# Patient Record
Sex: Male | Born: 1935 | Race: White | Hispanic: No | Marital: Married | Smoking: Former smoker
Health system: Southern US, Community
[De-identification: ages and names within clinical notes are randomized; demographics above are authoritative.]

## PROBLEM LIST (undated history)

## (undated) DIAGNOSIS — I2510 Atherosclerotic heart disease of native coronary artery without angina pectoris: Secondary | ICD-10-CM

## (undated) DIAGNOSIS — R509 Fever, unspecified: Secondary | ICD-10-CM

## (undated) DIAGNOSIS — J189 Pneumonia, unspecified organism: Secondary | ICD-10-CM

## (undated) DIAGNOSIS — R011 Cardiac murmur, unspecified: Secondary | ICD-10-CM

## (undated) DIAGNOSIS — E119 Type 2 diabetes mellitus without complications: Secondary | ICD-10-CM

## (undated) DIAGNOSIS — K219 Gastro-esophageal reflux disease without esophagitis: Secondary | ICD-10-CM

## (undated) DIAGNOSIS — M1990 Unspecified osteoarthritis, unspecified site: Secondary | ICD-10-CM

## (undated) DIAGNOSIS — N189 Chronic kidney disease, unspecified: Secondary | ICD-10-CM

## (undated) DIAGNOSIS — G4730 Sleep apnea, unspecified: Secondary | ICD-10-CM

## (undated) DIAGNOSIS — E785 Hyperlipidemia, unspecified: Secondary | ICD-10-CM

## (undated) DIAGNOSIS — A6920 Lyme disease, unspecified: Secondary | ICD-10-CM

## (undated) DIAGNOSIS — I219 Acute myocardial infarction, unspecified: Secondary | ICD-10-CM

## (undated) DIAGNOSIS — I10 Essential (primary) hypertension: Secondary | ICD-10-CM

## (undated) DIAGNOSIS — M75101 Unspecified rotator cuff tear or rupture of right shoulder, not specified as traumatic: Secondary | ICD-10-CM

## (undated) DIAGNOSIS — J449 Chronic obstructive pulmonary disease, unspecified: Secondary | ICD-10-CM

## (undated) DIAGNOSIS — J45909 Unspecified asthma, uncomplicated: Secondary | ICD-10-CM

## (undated) DIAGNOSIS — I209 Angina pectoris, unspecified: Secondary | ICD-10-CM

## (undated) DIAGNOSIS — R0602 Shortness of breath: Secondary | ICD-10-CM

## (undated) HISTORY — DX: Type 2 diabetes mellitus without complications: E11.9

## (undated) HISTORY — DX: Chronic obstructive pulmonary disease, unspecified: J44.9

## (undated) HISTORY — DX: Essential (primary) hypertension: I10

## (undated) HISTORY — PX: APPENDECTOMY: SHX54

## (undated) HISTORY — DX: Hyperlipidemia, unspecified: E78.5

## (undated) HISTORY — PX: CHOLECYSTECTOMY: SHX55

## (undated) HISTORY — PX: KIDNEY STONE SURGERY: SHX686

## (undated) HISTORY — DX: Pneumonia, unspecified organism: J18.9

## (undated) HISTORY — DX: Atherosclerotic heart disease of native coronary artery without angina pectoris: I25.10

## (undated) HISTORY — PX: CERVICAL LAMINECTOMY: SHX94

## (undated) HISTORY — DX: Unspecified asthma, uncomplicated: J45.909

---

## 1997-10-26 ENCOUNTER — Ambulatory Visit (HOSPITAL_COMMUNITY): Admission: RE | Admit: 1997-10-26 | Discharge: 1997-10-26 | Payer: Self-pay | Admitting: Cardiology

## 1997-12-15 ENCOUNTER — Observation Stay (HOSPITAL_COMMUNITY): Admission: AD | Admit: 1997-12-15 | Discharge: 1997-12-16 | Payer: Self-pay | Admitting: *Deleted

## 1999-01-10 ENCOUNTER — Encounter: Payer: Self-pay | Admitting: Cardiology

## 1999-01-10 ENCOUNTER — Ambulatory Visit (HOSPITAL_COMMUNITY): Admission: RE | Admit: 1999-01-10 | Discharge: 1999-01-10 | Payer: Self-pay | Admitting: Cardiology

## 1999-01-13 ENCOUNTER — Encounter: Payer: Self-pay | Admitting: Cardiology

## 1999-01-13 ENCOUNTER — Ambulatory Visit (HOSPITAL_COMMUNITY): Admission: RE | Admit: 1999-01-13 | Discharge: 1999-01-13 | Payer: Self-pay | Admitting: Cardiology

## 2002-06-24 ENCOUNTER — Ambulatory Visit (HOSPITAL_BASED_OUTPATIENT_CLINIC_OR_DEPARTMENT_OTHER): Admission: RE | Admit: 2002-06-24 | Discharge: 2002-06-24 | Payer: Self-pay | Admitting: Internal Medicine

## 2004-01-04 ENCOUNTER — Ambulatory Visit: Payer: Self-pay | Admitting: Internal Medicine

## 2004-01-12 ENCOUNTER — Ambulatory Visit: Payer: Self-pay | Admitting: Internal Medicine

## 2004-01-14 ENCOUNTER — Ambulatory Visit: Payer: Self-pay | Admitting: Internal Medicine

## 2004-04-13 ENCOUNTER — Ambulatory Visit: Payer: Self-pay | Admitting: Internal Medicine

## 2004-07-13 ENCOUNTER — Ambulatory Visit: Payer: Self-pay | Admitting: Internal Medicine

## 2004-09-01 ENCOUNTER — Observation Stay (HOSPITAL_COMMUNITY): Admission: EM | Admit: 2004-09-01 | Discharge: 2004-09-01 | Payer: Self-pay | Admitting: Emergency Medicine

## 2004-09-01 ENCOUNTER — Ambulatory Visit: Payer: Self-pay | Admitting: Cardiology

## 2004-09-20 ENCOUNTER — Ambulatory Visit: Payer: Self-pay | Admitting: Internal Medicine

## 2004-09-27 ENCOUNTER — Ambulatory Visit: Payer: Self-pay | Admitting: Internal Medicine

## 2004-10-23 ENCOUNTER — Ambulatory Visit: Payer: Self-pay | Admitting: Internal Medicine

## 2004-10-27 ENCOUNTER — Encounter: Admission: RE | Admit: 2004-10-27 | Discharge: 2004-10-27 | Payer: Self-pay | Admitting: Internal Medicine

## 2004-12-27 ENCOUNTER — Ambulatory Visit: Payer: Self-pay | Admitting: Internal Medicine

## 2005-03-28 ENCOUNTER — Ambulatory Visit: Payer: Self-pay | Admitting: Internal Medicine

## 2005-06-20 ENCOUNTER — Ambulatory Visit: Payer: Self-pay | Admitting: Internal Medicine

## 2005-06-27 ENCOUNTER — Ambulatory Visit: Payer: Self-pay | Admitting: Internal Medicine

## 2005-09-25 ENCOUNTER — Ambulatory Visit: Payer: Self-pay | Admitting: Internal Medicine

## 2005-12-17 ENCOUNTER — Ambulatory Visit: Payer: Self-pay | Admitting: Internal Medicine

## 2005-12-17 LAB — CONVERTED CEMR LAB
AST: 21 units/L (ref 0–37)
Cholesterol: 135 mg/dL (ref 0–200)
HDL: 27.1 mg/dL — ABNORMAL LOW (ref >39.0)
Hgb A1c MFr Bld: 6.7 % — ABNORMAL HIGH (ref 4.6–6.0)
VLDL: 41 mg/dL — ABNORMAL HIGH (ref 0–40)

## 2006-03-13 ENCOUNTER — Emergency Department (HOSPITAL_COMMUNITY): Admission: EM | Admit: 2006-03-13 | Discharge: 2006-03-14 | Payer: Self-pay | Admitting: Emergency Medicine

## 2006-03-13 ENCOUNTER — Ambulatory Visit: Payer: Self-pay | Admitting: Internal Medicine

## 2006-06-12 ENCOUNTER — Ambulatory Visit: Payer: Self-pay | Admitting: Internal Medicine

## 2006-09-11 ENCOUNTER — Ambulatory Visit: Payer: Self-pay | Admitting: Internal Medicine

## 2006-09-11 DIAGNOSIS — E1129 Type 2 diabetes mellitus with other diabetic kidney complication: Secondary | ICD-10-CM

## 2006-09-11 DIAGNOSIS — G4730 Sleep apnea, unspecified: Secondary | ICD-10-CM

## 2006-09-11 DIAGNOSIS — I25119 Atherosclerotic heart disease of native coronary artery with unspecified angina pectoris: Secondary | ICD-10-CM | POA: Insufficient documentation

## 2006-09-11 DIAGNOSIS — E1159 Type 2 diabetes mellitus with other circulatory complications: Secondary | ICD-10-CM | POA: Insufficient documentation

## 2006-09-11 DIAGNOSIS — I10 Essential (primary) hypertension: Secondary | ICD-10-CM | POA: Insufficient documentation

## 2006-09-11 DIAGNOSIS — E785 Hyperlipidemia, unspecified: Secondary | ICD-10-CM

## 2006-09-11 DIAGNOSIS — M549 Dorsalgia, unspecified: Secondary | ICD-10-CM

## 2006-09-11 DIAGNOSIS — E1169 Type 2 diabetes mellitus with other specified complication: Secondary | ICD-10-CM | POA: Insufficient documentation

## 2006-09-11 LAB — CONVERTED CEMR LAB
ALT: 29 units/L (ref 0–53)
Albumin: 3.9 g/dL (ref 3.5–5.2)
Alkaline Phosphatase: 66 units/L (ref 39–117)
BUN: 22 mg/dL (ref 6–23)
Chloride: 109 meq/L (ref 96–112)
Cholesterol: 121 mg/dL (ref 0–200)
Direct LDL: 47 mg/dL
GFR calc non Af Amer: 49 mL/min
Glucose, Bld: 193 mg/dL — ABNORMAL HIGH (ref 70–99)
HDL: 28.5 mg/dL — ABNORMAL LOW (ref >39.0)
Potassium: 4.2 meq/L (ref 3.5–5.1)
Sodium: 140 meq/L (ref 135–145)
Total CHOL/HDL Ratio: 4.2
Triglycerides: 228 mg/dL (ref 0–149)

## 2006-12-26 ENCOUNTER — Ambulatory Visit: Payer: Self-pay | Admitting: Internal Medicine

## 2007-01-17 ENCOUNTER — Telehealth (INDEPENDENT_AMBULATORY_CARE_PROVIDER_SITE_OTHER): Payer: Self-pay | Admitting: *Deleted

## 2007-03-01 ENCOUNTER — Ambulatory Visit: Payer: Self-pay | Admitting: Family Medicine

## 2007-03-01 ENCOUNTER — Telehealth (INDEPENDENT_AMBULATORY_CARE_PROVIDER_SITE_OTHER): Payer: Self-pay | Admitting: Family Medicine

## 2007-03-01 ENCOUNTER — Inpatient Hospital Stay (HOSPITAL_COMMUNITY): Admission: EM | Admit: 2007-03-01 | Discharge: 2007-03-04 | Payer: Self-pay | Admitting: Family Medicine

## 2007-03-01 ENCOUNTER — Ambulatory Visit: Payer: Self-pay | Admitting: Internal Medicine

## 2007-03-02 ENCOUNTER — Encounter (INDEPENDENT_AMBULATORY_CARE_PROVIDER_SITE_OTHER): Payer: Self-pay | Admitting: Family Medicine

## 2007-03-13 ENCOUNTER — Ambulatory Visit: Payer: Self-pay | Admitting: Internal Medicine

## 2007-03-17 ENCOUNTER — Ambulatory Visit: Payer: Self-pay | Admitting: Internal Medicine

## 2007-04-01 ENCOUNTER — Ambulatory Visit: Payer: Self-pay | Admitting: Internal Medicine

## 2007-04-01 LAB — CONVERTED CEMR LAB
ALT: 52 units/L (ref 0–53)
AST: 28 units/L (ref 0–37)
Albumin: 4.1 g/dL (ref 3.5–5.2)
Alkaline Phosphatase: 70 units/L (ref 39–117)
Cholesterol: 119 mg/dL (ref 0–200)
PSA: 0.55 ng/mL (ref 0.10–4.00)
Total Bilirubin: 0.9 mg/dL (ref 0.3–1.2)
Total CHOL/HDL Ratio: 4.4
Triglycerides: 217 mg/dL (ref 0–149)

## 2007-04-07 ENCOUNTER — Encounter: Payer: Self-pay | Admitting: Internal Medicine

## 2007-04-08 ENCOUNTER — Ambulatory Visit: Payer: Self-pay | Admitting: Internal Medicine

## 2007-04-08 LAB — CONVERTED CEMR LAB
Creatinine,U: 199.4 mg/dL
GFR calc non Af Amer: 45 mL/min
Microalb Creat Ratio: 14.5 mg/g (ref 0.0–30.0)

## 2007-04-17 ENCOUNTER — Emergency Department (HOSPITAL_COMMUNITY): Admission: EM | Admit: 2007-04-17 | Discharge: 2007-04-17 | Payer: Self-pay | Admitting: Emergency Medicine

## 2007-04-18 ENCOUNTER — Telehealth: Payer: Self-pay | Admitting: Internal Medicine

## 2007-05-19 ENCOUNTER — Encounter: Payer: Self-pay | Admitting: Internal Medicine

## 2007-05-19 ENCOUNTER — Ambulatory Visit: Payer: Self-pay | Admitting: Internal Medicine

## 2007-05-19 LAB — CONVERTED CEMR LAB: Inflenza A Ag: NEGATIVE

## 2007-05-22 ENCOUNTER — Telehealth: Payer: Self-pay | Admitting: Internal Medicine

## 2007-05-23 ENCOUNTER — Ambulatory Visit: Payer: Self-pay | Admitting: Internal Medicine

## 2007-05-23 LAB — CONVERTED CEMR LAB
Basophils Absolute: 0 10*3/uL (ref 0.0–0.1)
Basophils Relative: 0.6 % (ref 0.0–1.0)
Eosinophils Relative: 11.7 % — ABNORMAL HIGH (ref 0.0–5.0)
HCT: 35.3 % — ABNORMAL LOW (ref 39.0–52.0)
Neutro Abs: 4 10*3/uL (ref 1.4–7.7)
Neutrophils Relative %: 48.4 % (ref 43.0–77.0)
RDW: 14.4 % (ref 11.5–14.6)

## 2007-06-23 ENCOUNTER — Ambulatory Visit: Payer: Self-pay | Admitting: Internal Medicine

## 2007-06-23 DIAGNOSIS — R5383 Other fatigue: Secondary | ICD-10-CM

## 2007-06-23 DIAGNOSIS — R5381 Other malaise: Secondary | ICD-10-CM

## 2007-06-23 LAB — CONVERTED CEMR LAB
Basophils Relative: 0.1 % (ref 0.0–1.0)
Eosinophils Absolute: 0.8 10*3/uL — ABNORMAL HIGH (ref 0.0–0.7)
Eosinophils Relative: 7.6 % — ABNORMAL HIGH (ref 0.0–5.0)
HCT: 39.6 % (ref 39.0–52.0)
HDL goal, serum: 40 mg/dL
Hemoglobin: 13.3 g/dL (ref 13.0–17.0)
MCHC: 33.7 g/dL (ref 30.0–36.0)
MCV: 86.6 fL (ref 78.0–100.0)
Monocytes Absolute: 0.7 10*3/uL (ref 0.1–1.0)
Neutro Abs: 6.2 10*3/uL (ref 1.4–7.7)
Neutrophils Relative %: 59.5 % (ref 43.0–77.0)
RBC: 4.57 M/uL (ref 4.22–5.81)
RDW: 14.5 % (ref 11.5–14.6)
Relative Index: 3.2 — ABNORMAL HIGH (ref 0.0–2.5)

## 2007-06-26 ENCOUNTER — Telehealth: Payer: Self-pay | Admitting: Internal Medicine

## 2007-07-31 ENCOUNTER — Ambulatory Visit: Payer: Self-pay | Admitting: Internal Medicine

## 2007-07-31 DIAGNOSIS — G20 Parkinson's disease: Secondary | ICD-10-CM

## 2007-07-31 LAB — CONVERTED CEMR LAB
Chloride: 106 meq/L (ref 96–112)
Creatinine, Ser: 1.6 mg/dL — ABNORMAL HIGH (ref 0.4–1.5)
Sodium: 140 meq/L (ref 135–145)

## 2007-09-02 ENCOUNTER — Ambulatory Visit: Payer: Self-pay | Admitting: Internal Medicine

## 2007-09-22 ENCOUNTER — Ambulatory Visit: Payer: Self-pay | Admitting: Internal Medicine

## 2007-09-22 LAB — CONVERTED CEMR LAB: Hgb A1c MFr Bld: 7.3 % — ABNORMAL HIGH (ref 4.6–6.0)

## 2007-09-30 ENCOUNTER — Ambulatory Visit: Payer: Self-pay | Admitting: Internal Medicine

## 2007-09-30 DIAGNOSIS — H6120 Impacted cerumen, unspecified ear: Secondary | ICD-10-CM

## 2007-11-28 ENCOUNTER — Ambulatory Visit: Payer: Self-pay | Admitting: Internal Medicine

## 2007-11-29 ENCOUNTER — Encounter: Payer: Self-pay | Admitting: Internal Medicine

## 2007-11-29 LAB — CONVERTED CEMR LAB
BUN: 27 mg/dL — ABNORMAL HIGH (ref 6–23)
Calcium: 9.8 mg/dL (ref 8.4–10.5)
Chloride: 102 meq/L (ref 96–112)
Creatinine, Ser: 1.66 mg/dL — ABNORMAL HIGH (ref 0.40–1.50)
Hgb A1c MFr Bld: 7.5 % — ABNORMAL HIGH (ref 4.6–6.1)
Potassium: 4.4 meq/L (ref 3.5–5.3)

## 2007-12-09 ENCOUNTER — Encounter: Payer: Self-pay | Admitting: Internal Medicine

## 2007-12-09 ENCOUNTER — Ambulatory Visit: Payer: Self-pay

## 2007-12-12 ENCOUNTER — Ambulatory Visit: Payer: Self-pay | Admitting: Internal Medicine

## 2007-12-12 DIAGNOSIS — J441 Chronic obstructive pulmonary disease with (acute) exacerbation: Secondary | ICD-10-CM

## 2007-12-12 LAB — CONVERTED CEMR LAB
ALT: 19 units/L (ref 0–53)
Albumin: 4.4 g/dL (ref 3.5–5.2)
Alkaline Phosphatase: 67 units/L (ref 39–117)
Eosinophils Relative: 14 % — ABNORMAL HIGH (ref 0–5)
Indirect Bilirubin: 0.4 mg/dL (ref 0.0–0.9)
Monocytes Relative: 8 % (ref 3–12)
Neutro Abs: 3.8 10*3/uL (ref 1.7–7.7)
Neutrophils Relative %: 51 % (ref 43–77)
Platelets: 114 10*3/uL — ABNORMAL LOW (ref 150–400)
WBC: 7.4 10*3/uL (ref 4.0–10.5)

## 2007-12-23 ENCOUNTER — Encounter: Payer: Self-pay | Admitting: Internal Medicine

## 2008-01-06 ENCOUNTER — Telehealth: Payer: Self-pay | Admitting: Internal Medicine

## 2008-01-12 ENCOUNTER — Ambulatory Visit: Payer: Self-pay | Admitting: Internal Medicine

## 2008-03-15 ENCOUNTER — Ambulatory Visit: Payer: Self-pay | Admitting: Internal Medicine

## 2008-03-15 DIAGNOSIS — N413 Prostatocystitis: Secondary | ICD-10-CM | POA: Insufficient documentation

## 2008-03-15 LAB — CONVERTED CEMR LAB
ALT: 18 units/L (ref 0–53)
AST: 20 units/L (ref 0–37)
Albumin: 4 g/dL (ref 3.5–5.2)
Alkaline Phosphatase: 57 units/L (ref 39–117)
Calcium: 10.2 mg/dL (ref 8.4–10.5)
Cholesterol: 123 mg/dL (ref 0–200)
Creatinine, Ser: 1.6 mg/dL — ABNORMAL HIGH (ref 0.4–1.5)
GFR calc Af Amer: 55 mL/min
HDL: 28.8 mg/dL — ABNORMAL LOW (ref >39.0)
LDL Cholesterol: 60 mg/dL (ref 0–99)
Microalb, Ur: 1.5 mg/dL (ref 0.0–1.9)
PSA: 0.76 ng/mL (ref 0.10–4.00)
Total Bilirubin: 0.8 mg/dL (ref 0.3–1.2)
Total Protein: 6.7 g/dL (ref 6.0–8.3)

## 2008-06-14 ENCOUNTER — Ambulatory Visit: Payer: Self-pay | Admitting: Internal Medicine

## 2008-06-14 DIAGNOSIS — L570 Actinic keratosis: Secondary | ICD-10-CM

## 2008-08-13 ENCOUNTER — Ambulatory Visit: Payer: Self-pay | Admitting: Internal Medicine

## 2008-08-13 LAB — CONVERTED CEMR LAB: PSA: 0.8 ng/mL (ref 0.10–4.00)

## 2008-08-20 ENCOUNTER — Ambulatory Visit: Payer: Self-pay | Admitting: Internal Medicine

## 2008-08-20 LAB — CONVERTED CEMR LAB
BUN: 30 mg/dL — ABNORMAL HIGH (ref 6–23)
Calcium: 9.4 mg/dL (ref 8.4–10.5)
Potassium: 4.8 meq/L (ref 3.5–5.1)

## 2008-11-24 ENCOUNTER — Ambulatory Visit: Payer: Self-pay | Admitting: Internal Medicine

## 2008-11-24 DIAGNOSIS — R361 Hematospermia: Secondary | ICD-10-CM | POA: Insufficient documentation

## 2008-12-18 ENCOUNTER — Telehealth: Payer: Self-pay | Admitting: Internal Medicine

## 2009-01-16 ENCOUNTER — Emergency Department (HOSPITAL_COMMUNITY): Admission: EM | Admit: 2009-01-16 | Discharge: 2009-01-17 | Payer: Self-pay | Admitting: Emergency Medicine

## 2009-01-21 ENCOUNTER — Ambulatory Visit: Payer: Self-pay | Admitting: Internal Medicine

## 2009-01-21 DIAGNOSIS — N200 Calculus of kidney: Secondary | ICD-10-CM | POA: Insufficient documentation

## 2009-02-02 ENCOUNTER — Telehealth: Payer: Self-pay | Admitting: Internal Medicine

## 2009-02-11 ENCOUNTER — Encounter (INDEPENDENT_AMBULATORY_CARE_PROVIDER_SITE_OTHER): Payer: Self-pay | Admitting: *Deleted

## 2009-02-14 ENCOUNTER — Ambulatory Visit: Payer: Self-pay | Admitting: Internal Medicine

## 2009-02-14 LAB — CONVERTED CEMR LAB
Basophils Relative: 0.9 % (ref 0.0–3.0)
Bilirubin, Direct: 0 mg/dL (ref 0.0–0.3)
Calcium: 9.3 mg/dL (ref 8.4–10.5)
Chloride: 108 meq/L (ref 96–112)
Cholesterol: 102 mg/dL (ref 0–200)
Creatinine, Ser: 1.8 mg/dL — ABNORMAL HIGH (ref 0.4–1.5)
Eosinophils Absolute: 1.4 10*3/uL — ABNORMAL HIGH (ref 0.0–0.7)
Eosinophils Relative: 19.8 % — ABNORMAL HIGH (ref 0.0–5.0)
HCT: 39.1 % (ref 39.0–52.0)
LDL Cholesterol: 39 mg/dL (ref 0–99)
MCHC: 34 g/dL (ref 30.0–36.0)
PSA: 0.78 ng/mL (ref 0.10–4.00)
Potassium: 4.2 meq/L (ref 3.5–5.1)
Sodium: 145 meq/L (ref 135–145)
Total CHOL/HDL Ratio: 4
Total Protein: 6.4 g/dL (ref 6.0–8.3)
Triglycerides: 168 mg/dL — ABNORMAL HIGH (ref 0.0–149.0)
WBC: 7 10*3/uL (ref 4.5–10.5)

## 2009-02-22 ENCOUNTER — Ambulatory Visit: Payer: Self-pay | Admitting: Internal Medicine

## 2009-02-22 DIAGNOSIS — N1832 Chronic kidney disease, stage 3b: Secondary | ICD-10-CM | POA: Insufficient documentation

## 2009-02-22 DIAGNOSIS — N183 Chronic kidney disease, stage 3 (moderate): Secondary | ICD-10-CM

## 2009-03-15 ENCOUNTER — Encounter: Payer: Self-pay | Admitting: Internal Medicine

## 2009-04-08 ENCOUNTER — Telehealth: Payer: Self-pay | Admitting: Internal Medicine

## 2009-05-23 ENCOUNTER — Encounter: Payer: Self-pay | Admitting: Internal Medicine

## 2009-05-24 ENCOUNTER — Ambulatory Visit: Payer: Self-pay | Admitting: Internal Medicine

## 2009-05-24 LAB — CONVERTED CEMR LAB
CO2: 32 meq/L (ref 19–32)
Calcium: 9.2 mg/dL (ref 8.4–10.5)
Chloride: 105 meq/L (ref 96–112)
Creatinine, Ser: 1.8 mg/dL — ABNORMAL HIGH (ref 0.4–1.5)
GFR calc non Af Amer: 39.34 mL/min (ref >60)
Glucose, Bld: 164 mg/dL — ABNORMAL HIGH (ref 70–99)
Hgb A1c MFr Bld: 7.8 % — ABNORMAL HIGH (ref 4.6–6.5)
Potassium: 5.1 meq/L (ref 3.5–5.1)

## 2009-06-08 LAB — HM DIABETES EYE EXAM: HM Diabetic Eye Exam: NORMAL

## 2009-06-14 ENCOUNTER — Encounter: Payer: Self-pay | Admitting: Internal Medicine

## 2009-09-23 ENCOUNTER — Ambulatory Visit: Payer: Self-pay | Admitting: Internal Medicine

## 2009-09-23 LAB — CONVERTED CEMR LAB
BUN: 30 mg/dL — ABNORMAL HIGH (ref 6–23)
Cholesterol: 133 mg/dL (ref 0–200)
HDL: 29.1 mg/dL — ABNORMAL LOW (ref >39.00)
Potassium: 4.6 meq/L (ref 3.5–5.1)

## 2009-12-20 ENCOUNTER — Encounter: Payer: Self-pay | Admitting: Internal Medicine

## 2009-12-30 ENCOUNTER — Ambulatory Visit: Payer: Self-pay | Admitting: Internal Medicine

## 2009-12-30 LAB — CONVERTED CEMR LAB
CO2: 31 meq/L (ref 19–32)
Chloride: 104 meq/L (ref 96–112)
Creatinine, Ser: 2 mg/dL — ABNORMAL HIGH (ref 0.4–1.5)
Hgb A1c MFr Bld: 8.1 % — ABNORMAL HIGH (ref 4.6–6.5)
Sodium: 140 meq/L (ref 135–145)

## 2009-12-30 LAB — HM DIABETES FOOT EXAM

## 2010-03-31 ENCOUNTER — Ambulatory Visit
Admission: RE | Admit: 2010-03-31 | Discharge: 2010-03-31 | Payer: Self-pay | Source: Home / Self Care | Attending: Internal Medicine | Admitting: Internal Medicine

## 2010-03-31 ENCOUNTER — Other Ambulatory Visit: Payer: Self-pay | Admitting: Internal Medicine

## 2010-03-31 LAB — BASIC METABOLIC PANEL
Chloride: 106 mEq/L (ref 96–112)
GFR: 40.82 mL/min — ABNORMAL LOW (ref >60.00)
Potassium: 5.5 mEq/L — ABNORMAL HIGH (ref 3.5–5.1)

## 2010-03-31 LAB — MICROALBUMIN / CREATININE URINE RATIO: Creatinine,U: 149.8 mg/dL

## 2010-04-04 NOTE — Consult Note (Signed)
Summary: [facility]  [facility]   Imported By: Laural Benes 03/21/2009 09:54:50  _____________________________________________________________________  External Attachment:    Type:   Image     Comment:   External Document

## 2010-04-04 NOTE — Letter (Signed)
Summary: Eye Exam/[facility] [place] [facility]   Imported By: Laural Benes 05/25/2009 14:31:11  _____________________________________________________________________  External Attachment:    Type:   Image     Comment:   External Document

## 2010-04-04 NOTE — Assessment & Plan Note (Signed)
Summary: 3 MO ROV/MM   Vital Signs:  Patient profile:   77 year old male Height:      70 inches Weight:      214 pounds BMI:     30.82 Temp:     98.1 degrees F oral Pulse rate:   68 / minute Resp:     14 per minute BP sitting:   130 / 80  (left arm)  Vitals Entered By: Allyne Gee, LPN (March 22, [id number] [id number] AM) CC: roa-per urology note- urocrit-k 2 bid was added, Hypertension Management, Lipid Management   CC:  roa-per urology note- urocrit-k 2 bid was added, Hypertension Management, and Lipid Management.  History of Present Illness: DISCUSSION OF HEMATOSPERMIA AND BLOOD IN URINE AND COMPLETE WORK UP INCLUDING CYSTOSCOPY  Hypertension History:      He denies headache, chest pain, palpitations, dyspnea with exertion, orthopnea, PND, peripheral edema, visual symptoms, neurologic problems, syncope, and side effects from treatment.        Positive major cardiovascular risk factors include male age 12 years old or older, diabetes, hyperlipidemia, and hypertension.  Negative major cardiovascular risk factors include negative family history for ischemic heart disease and non-tobacco-user status.        Positive history for target organ damage include ASHD (either angina/prior MI/prior CABG).  Further assessment for target organ damage reveals no history of stroke/TIA or peripheral vascular disease.    Lipid Management History:      Positive NCEP/ATP III risk factors include male age 57 years old or older, diabetes, HDL cholesterol less than 40, hypertension, and ASHD (either angina/prior MI/prior CABG).  Negative NCEP/ATP III risk factors include no family history for ischemic heart disease, non-tobacco-user status, no prior stroke/TIA, no peripheral vascular disease, and no history of aortic aneurysm.      Preventive Screening-Counseling & Management  Alcohol-Tobacco     Smoking Status: quit     Year Quit: 1970     Passive Smoke Exposure: no  Problems Prior to Update: 1)   Cerumen Impaction  (ICD-380.4) 2)  Chronic Kidney Disease Stage II (MILD)  (ICD-585.2) 3)  Preventive Health Care  (ICD-V70.0) 4)  Nephrolithiasis, Recurrent  (ICD-592.0) 5)  Hematospermia  (ICD-608.82) 6)  Actinic Keratosis, Head  (ICD-702.0) 7)  Prostatocystitis  (ICD-601.3) 8)  Chronic Obstructive Pulmonary Disease, Acute Exacerbation  (ICD-491.21) 9)  Cerumen Impaction  (ICD-380.4) 10)  Parkinson's Disease  (ICD-332.0) 11)  Malaise and Fatigue  (ICD-780.79) 12)  Sleep Apnea  (ICD-780.57) 13)  Back Pain  (ICD-724.5) 14)  Hypertension  (ICD-401.9) 15)  Hyperlipidemia  (ICD-272.4) 16)  Diabetes Mellitus, Type II  (ICD-250.00) 17)  Coronary Artery Disease  (ICD-414.00)  Current Problems (verified): 1)  Cerumen Impaction  (ICD-380.4) 2)  Chronic Kidney Disease Stage II (MILD)  (ICD-585.2) 3)  Preventive Health Care  (ICD-V70.0) 4)  Nephrolithiasis, Recurrent  (ICD-592.0) 5)  Hematospermia  (ICD-608.82) 6)  Actinic Keratosis, Head  (ICD-702.0) 7)  Prostatocystitis  (ICD-601.3) 8)  Chronic Obstructive Pulmonary Disease, Acute Exacerbation  (ICD-491.21) 9)  Cerumen Impaction  (ICD-380.4) 10)  Parkinson's Disease  (ICD-332.0) 11)  Malaise and Fatigue  (ICD-780.79) 12)  Sleep Apnea  (ICD-780.57) 13)  Back Pain  (ICD-724.5) 14)  Hypertension  (ICD-401.9) 15)  Hyperlipidemia  (ICD-272.4) 16)  Diabetes Mellitus, Type II  (ICD-250.00) 17)  Coronary Artery Disease  (ICD-414.00)  Medications Prior to Update: 1)  Tramadol Hcl 50 Mg Tabs (Tramadol Hcl) .... With Tylenol 325mg  Every 6 Hours Prjn Pain(This Is Substitute For  Dct-N) 2)  Trazodone Hcl 150 Mg  Tabs (Trazodone Hcl) .... Once Daily 3)  Imdur 30 Mg  Tb24 (Isosorbide Mononitrate) .... 3 Once Daily 4)  Metoprolol Succinate 50 Mg  Tb24 (Metoprolol Succinate) .... 1/2 Two Times A Day 5)  Lipitor 10 Mg  Tabs (Atorvastatin Calcium) .... Once Daily 6)  Zantac 300 Mg  Tabs (Ranitidine Hcl) .... Two Times A Day 7)  Amaryl 2 Mg  Tabs  (Glimepiride) .[name].. 1 Once Daily 8)  Therapy Bayer Ec 325 Mg  Tbec (Aspirin) .... Once Daily 9)  Nitro-Dur 0.4 Mg/hr  Pt24 (Nitroglycerin) .... As Needed 10)  Advair Diskus 100-50 Mcg/dose Misc (Fluticasone-Salmeterol) .... One Puff Daily 11)  Metformin Hcl 500 Mg Xr24h-Tab (Metformin Hcl) .[name].. 1 Once Daily 12)  Betamethasone Dipropionate 0.05 % Oint (Betamethasone Dipropionate) .... Aplly Daily As Directed 13)  Doxycycline Hyclate 100 Mg Caps (Doxycycline Hyclate) .... One By Mouth Daily  Current Medications (verified): 1)  Tramadol Hcl 50 Mg Tabs (Tramadol Hcl) .... With Tylenol 325mg  Every 6 Hours Prjn Pain(This Is Substitute For Dct-N) 2)  Trazodone Hcl 150 Mg  Tabs (Trazodone Hcl) .... Once Daily 3)  Imdur 30 Mg  Tb24 (Isosorbide Mononitrate) .... 3 Once Daily 4)  Metoprolol Succinate 50 Mg  Tb24 (Metoprolol Succinate) .... 1/2 Two Times A Day 5)  Lipitor 10 Mg  Tabs (Atorvastatin Calcium) .... Once Daily 6)  Zantac 300 Mg  Tabs (Ranitidine Hcl) .... Two Times A Day 7)  Amaryl 2 Mg  Tabs (Glimepiride) .[name].. 1 Once Daily 8)  Therapy Bayer Ec 325 Mg  Tbec (Aspirin) .... Once Daily 9)  Nitro-Dur 0.4 Mg/hr  Pt24 (Nitroglycerin) .... As Needed 10)  Advair Diskus 100-50 Mcg/dose Misc (Fluticasone-Salmeterol) .... One Puff Daily 11)  Metformin Hcl 500 Mg Xr24h-Tab (Metformin Hcl) .[name].. 1 Once Daily 12)  Betamethasone Dipropionate 0.05 % Oint (Betamethasone Dipropionate) .... Aplly Daily As Directed 13)  Doxycycline Hyclate 100 Mg Caps (Doxycycline Hyclate) .... One By Mouth Daily 14)  Uroxatral 10 Mg Tb24 (Alfuzosin Hcl) .[name].. 1 By Mouth Daily 15)  Urocrit K 15 .[name].. 1 Two Times A Day  Allergies (verified): 1)  ! Codeine Sulfate (Codeine Sulfate) 2)  Keflex (Cephalexin)  Past History:  Family History: Last updated: 12/26/2006 Family History High cholesterol Family History Hypertension  Social History: Last updated: 12/26/2006 Married Former Smoker Alcohol use-no  Risk  Factors: Caffeine Use: 1 (12/26/2006) Exercise: no (12/26/2006)  Risk Factors: Smoking Status: quit (05/24/2009) Passive Smoke Exposure: no (05/24/2009)  Past medical, surgical, family and social histories (including risk factors) reviewed, and no changes noted (except as noted below).  Past Medical History: Reviewed history from 03/15/2008 and no changes required. Coronary artery disease Diabetes mellitus, type II Hyperlipidemia Hypertension copd stage 2  Past Surgical History: Reviewed history from 04/08/2007 and no changes required. Cervical laminectomy  Percutaneous transluminal coronary angioplasty with atherectomy 1999  Family History: Reviewed history from 12/26/2006 and no changes required. Family History High cholesterol Family History Hypertension  Social History: Reviewed history from 12/26/2006 and no changes required. Married Former Smoker Alcohol use-no  Review of Systems  The patient denies anorexia, fever, weight loss, weight gain, vision loss, decreased hearing, hoarseness, chest pain, syncope, dyspnea on exertion, peripheral edema, prolonged cough, headaches, hemoptysis, abdominal pain, melena, hematochezia, severe indigestion/heartburn, hematuria, incontinence, genital sores, muscle weakness, suspicious skin lesions, transient blindness, difficulty walking, depression, unusual weight change, abnormal bleeding, enlarged lymph nodes, angioedema, breast masses, and testicular masses.    Physical Exam  General:  alert, well-developed, and pale.   Head:  normocephalic and atraumatic.   Eyes:  pupils equal and pupils round.   Ears:  R ear normal and L ear normal.   Mouth:  pharynx pink and moist, no erythema, and no posterior lymphoid hypertrophy.   Neck:  No deformities, masses, or tenderness noted. Lungs:  normal respiratory effort, no accessory muscle use, and normal breath sounds.   Heart:  normal rate, regular rhythm, and no gallop.   Abdomen:  soft,  normal bowel sounds, no distention, and no masses.    Diabetes Management Exam:    Foot Exam (with socks and/or shoes not present):       Sensory-Pinprick/Light touch:          Left medial foot (L-4): diminished          Left dorsal foot (L-5): diminished          Left lateral foot (S-1): diminished          Right medial foot (L-4): diminished          Right dorsal foot (L-5): diminished          Right lateral foot (S-1): diminished       Nails:          Left foot: normal          Right foot: normal    Eye Exam:       Eye Exam done elsewhere          Date: 05/23/2009          Results: normal          Done by: Gershon Crane   Impression & Recommendations:  Problem # 1:  CHRONIC KIDNEY DISEASE STAGE II (MILD) (ICD-585.2) Assessment Unchanged MONITERING LABS Labs Reviewed: BUN: 27 (02/14/2009)   Cr: 1.8 (02/14/2009)    Hgb: 13.3 (02/14/2009)   Hct: 39.1 (02/14/2009)   Ca++: 9.3 (02/14/2009)    TP: 6.4 (02/14/2009)   Alb: 4.0 (02/14/2009)  Problem # 2:  HYPERTENSION (ICD-401.9) Assessment: Unchanged  His updated medication list for this problem includes:    Metoprolol Succinate 50 Mg Tb24 (Metoprolol succinate) .[name]... 1/2 two times a day  BP today: 130/80 Prior BP: 140/76 (02/22/2009)  Prior 10 Yr Risk Heart Disease: N/A (12/26/2006)  Labs Reviewed: K+: 4.2 (02/14/2009) Creat: : 1.8 (02/14/2009)   Chol: 102 (02/14/2009)   HDL: 29.00 (02/14/2009)   LDL: 39 (02/14/2009)   TG: 168.0 (02/14/2009)  Problem # 3:  DIABETES MELLITUS, TYPE II (ICD-250.00)  monitering , CBG;s goo DM eye exam and foot exam His updated medication list for this problem includes:    Amaryl 2 Mg Tabs (Glimepiride) .[name]... 1 once daily    Therapy Bayer Ec 325 Mg Tbec (Aspirin) ..... Once daily    Metformin Hcl 500 Mg Xr24h-tab (Metformin hcl) .[name]... 1 once daily  Orders: Venipuncture [id number]) TLB-BMP (Basic Metabolic Panel-BMET) (99991111) TLB-A1C / Hgb A1C (Glycohemoglobin) (83036-A1C)  Labs  Reviewed: Creat: 1.8 (02/14/2009)     Last Eye Exam: normal (05/23/2009) Reviewed HgBA1c results: 7.3 (08/13/2008)  7.5 (03/15/2008)  Complete Medication List: 1)  Tramadol Hcl 50 Mg Tabs (Tramadol hcl) .... With tylenol 325mg  every 6 hours prjn pain(this is substitute for dct-n) 2)  Trazodone Hcl 150 Mg Tabs (Trazodone hcl) .... Once daily 3)  Imdur 30 Mg Tb24 (Isosorbide mononitrate) .... 3 once daily 4)  Metoprolol Succinate 50 Mg Tb24 (Metoprolol succinate) .... 1/2 two times a day 5)  Lipitor 10  Mg Tabs (Atorvastatin calcium) .... Once daily 6)  Zantac 300 Mg Tabs (Ranitidine hcl) .... Two times a day 7)  Amaryl 2 Mg Tabs (Glimepiride) .[name].. 1 once daily 8)  Therapy Bayer Ec 325 Mg Tbec (Aspirin) .... Once daily 9)  Nitro-dur 0.4 Mg/hr Pt24 (Nitroglycerin) .... As needed 10)  Advair Diskus 100-50 Mcg/dose Misc (Fluticasone-salmeterol) .... One puff daily 11)  Metformin Hcl 500 Mg Xr24h-tab (Metformin hcl) .[name].. 1 once daily 12)  Betamethasone Dipropionate 0.05 % Oint (Betamethasone dipropionate) .... Aplly daily as directed 13)  Doxycycline Hyclate 100 Mg Caps (Doxycycline hyclate) .... One by mouth daily 14)  Uroxatral 10 Mg Tb24 (Alfuzosin hcl) .[name].. 1 by mouth daily 15)  Urocrit K 15  .[name].. 1 two times a day  Hypertension Assessment/Plan:      The patient's hypertensive risk group is category C: Target organ damage and/or diabetes.  Today's blood pressure is 130/80.  His blood pressure goal is < 130/80.  Lipid Assessment/Plan:      Based on NCEP/ATP III, the patient's risk factor category is "history of coronary disease, peripheral vascular disease, cerebrovascular disease, or aortic aneurysm along with either diabetes, current smoker, or LDL > 130 plus HDL < 40 plus triglycerides > 200".  The patient's lipid goals are as follows: Total cholesterol goal is 200; LDL cholesterol goal is 70; HDL cholesterol goal is 40; Triglyceride goal is 150.  His LDL cholesterol goal has been met.     Patient Instructions: 1)  Please schedule a follow-up appointment in 4 months.

## 2010-04-04 NOTE — Progress Notes (Signed)
Summary: nede abx  Phone Note Call from Patient   Caller: Patient-----live call Reason for Call: Talk to Nurse Summary of Call: has a tick on him and would like an abx called to [facility]   in [place]. Initial call taken by: Despina Arias,  April 08, 2009 8:18 AM  Follow-up for Phone Call        Pt. has a tick that is still attached, and has put vaseline on it.  Is finishing up the Doxycyline (long term) , and wants to know if he should take more due to the tick bite.  Should he leave the tick on? [phone]  Follow-up by: Deanna Artis CMA,  April 08, 2009 8:31 AM  Additional Follow-up for Phone Call Additional follow up Details #1::        remove the tick.... should be covered by the doxy. stay out of woods! Additional Follow-up by: Ricard Dillon MD,  April 08, 2009 9:15 AM    Additional Follow-up for Phone Call Additional follow up Details #2::    Pt. advised. Follow-up by: Deanna Artis CMA,  April 08, 2009 9:29 AM

## 2010-04-04 NOTE — Consult Note (Signed)
Summary: [facility]  [facility]   Imported By: Laural Benes 06/20/2009 10:58:58  _____________________________________________________________________  External Attachment:    Type:   Image     Comment:   External Document

## 2010-04-04 NOTE — Assessment & Plan Note (Signed)
Summary: 3 month fup//ccm/pt rescd from bump//ccm   Vital Signs:  Patient profile:   77 year old male Height:      70 inches Weight:      209 pounds BMI:     30.10 Temp:     98.2 degrees F oral Pulse rate:   60 / minute Resp:     14 per minute BP sitting:   136 / 80  (left arm)  Vitals Entered By: Allyne Gee, LPN (October 28, 624THL 9:46 AM) CC: roa- c/o skin in ear canal cracking and irritated, Hypertension Management Is Patient Diabetic? Yes Did you bring your meter with you today? No   Primary Care Provider:  Ricard Dillon MD  CC:  roa- c/o skin in ear canal cracking and irritated and Hypertension Management.  History of Present Illness: The pt saw the urologist for renal stone and hematuria the pt was given a liguid for stones ut it was too expesive and he dod not get it filled celebrated 89 aniversary His CBGs are improved  Hypertension History:      He denies headache, chest pain, palpitations, dyspnea with exertion, orthopnea, PND, peripheral edema, visual symptoms, neurologic problems, syncope, and side effects from treatment.        Positive major cardiovascular risk factors include male age 57 years old or older, diabetes, hyperlipidemia, and hypertension.  Negative major cardiovascular risk factors include negative family history for ischemic heart disease and non-tobacco-user status.        Positive history for target organ damage include ASHD (either angina/prior MI/prior CABG).  Further assessment for target organ damage reveals no history of stroke/TIA or peripheral vascular disease.     Preventive Screening-Counseling & Management  Alcohol-Tobacco     Smoking Status: quit     Year Quit: 1970     Passive Smoke Exposure: no     Tobacco Counseling: to remain off tobacco products  Problems Prior to Update: 1)  Cerumen Impaction  (ICD-380.4) 2)  Chronic Kidney Disease Stage II (MILD)  (ICD-585.2) 3)  Preventive Health Care  (ICD-V70.0) 4)   Nephrolithiasis, Recurrent  (ICD-592.0) 5)  Hematospermia  (ICD-608.82) 6)  Actinic Keratosis, Head  (ICD-702.0) 7)  Prostatocystitis  (ICD-601.3) 8)  Chronic Obstructive Pulmonary Disease, Acute Exacerbation  (ICD-491.21) 9)  Cerumen Impaction  (ICD-380.4) 10)  Parkinson's Disease  (ICD-332.0) 11)  Malaise and Fatigue  (ICD-780.79) 12)  Sleep Apnea  (ICD-780.57) 13)  Back Pain  (ICD-724.5) 14)  Hypertension  (ICD-401.9) 15)  Hyperlipidemia  (ICD-272.4) 16)  Diabetes Mellitus, Type II  (ICD-250.00) 17)  Coronary Artery Disease  (ICD-414.00)  Current Problems (verified): 1)  Cerumen Impaction  (ICD-380.4) 2)  Chronic Kidney Disease Stage II (MILD)  (ICD-585.2) 3)  Preventive Health Care  (ICD-V70.0) 4)  Nephrolithiasis, Recurrent  (ICD-592.0) 5)  Hematospermia  (ICD-608.82) 6)  Actinic Keratosis, Head  (ICD-702.0) 7)  Prostatocystitis  (ICD-601.3) 8)  Chronic Obstructive Pulmonary Disease, Acute Exacerbation  (ICD-491.21) 9)  Cerumen Impaction  (ICD-380.4) 10)  Parkinson's Disease  (ICD-332.0) 11)  Malaise and Fatigue  (ICD-780.79) 12)  Sleep Apnea  (ICD-780.57) 13)  Back Pain  (ICD-724.5) 14)  Hypertension  (ICD-401.9) 15)  Hyperlipidemia  (ICD-272.4) 16)  Diabetes Mellitus, Type II  (ICD-250.00) 17)  Coronary Artery Disease  (ICD-414.00)  Medications Prior to Update: 1)  Tramadol Hcl 50 Mg Tabs (Tramadol Hcl) .... With Tylenol 325mg  Every 6 Hours Prjn Pain(This Is Substitute For Dct-N) 2)  Trazodone Hcl 150 Mg  Tabs (  Trazodone Hcl) .... Once Daily 3)  Imdur 30 Mg  Tb24 (Isosorbide Mononitrate) .... 3 Once Daily 4)  Metoprolol Succinate 50 Mg  Tb24 (Metoprolol Succinate) .... 1/2 Two Times A Day 5)  Lipitor 10 Mg  Tabs (Atorvastatin Calcium) .... Once Daily 6)  Zantac 300 Mg  Tabs (Ranitidine Hcl) .... Two Times A Day 7)  Amaryl 2 Mg  Tabs (Glimepiride) .[name].. 1 Once Daily 8)  Therapy Bayer Ec 325 Mg  Tbec (Aspirin) .... Once Daily 9)  Nitro-Dur 0.4 Mg/hr  Pt24  (Nitroglycerin) .... As Needed 10)  Advair Diskus 100-50 Mcg/dose Misc (Fluticasone-Salmeterol) .... One Puff Daily 11)  Metformin Hcl 500 Mg Xr24h-Tab (Metformin Hcl) .[name].. 1 Once Daily 12)  Betamethasone Dipropionate 0.05 % Oint (Betamethasone Dipropionate) .... Aplly Daily As Directed 13)  Uroxatral 10 Mg Tb24 (Alfuzosin Hcl) .[name].. 1 By Mouth Daily 14)  Urocit-K 15 15 Meq (1620 Mg) Cr-Tabs (Potassium Citrate) .[name].. 1 Two Times A Day 15)  Cortisporin 3.5-10000-1 Soln (Neomycin-Polymyxin-Hc) .... 4 Drops in Both Ears Qid For 10 Days  Current Medications (verified): 1)  Tramadol Hcl 50 Mg Tabs (Tramadol Hcl) .... With Tylenol 325mg  Every 6 Hours Prjn Pain(This Is Substitute For Dct-N) 2)  Trazodone Hcl 150 Mg  Tabs (Trazodone Hcl) .... Once Daily 3)  Imdur 30 Mg  Tb24 (Isosorbide Mononitrate) .... 3 Once Daily 4)  Metoprolol Succinate 50 Mg  Tb24 (Metoprolol Succinate) .... 1/2 Two Times A Day 5)  Lipitor 10 Mg  Tabs (Atorvastatin Calcium) .... Once Daily 6)  Zantac 300 Mg  Tabs (Ranitidine Hcl) .... Two Times A Day 7)  Amaryl 2 Mg  Tabs (Glimepiride) .[name].. 1 Once Daily 8)  Therapy Bayer Ec 325 Mg  Tbec (Aspirin) .... Once Daily 9)  Nitro-Dur 0.4 Mg/hr  Pt24 (Nitroglycerin) .... As Needed 10)  Advair Diskus 100-50 Mcg/dose Misc (Fluticasone-Salmeterol) .... One Puff Daily 11)  Metformin Hcl 500 Mg Xr24h-Tab (Metformin Hcl) .[name].. 1 Once Daily 12)  Betamethasone Dipropionate 0.05 % Oint (Betamethasone Dipropionate) .... Aplly Daily As Directed 13)  Uroxatral 10 Mg Tb24 (Alfuzosin Hcl) .[name].. 1 By Mouth Daily 14)  Urocit-K 15 15 Meq (1620 Mg) Cr-Tabs (Potassium Citrate) .[name].. 1 Two Times A Day 15)  Cortisporin 3.5-10000-1 Soln (Neomycin-Polymyxin-Hc) .... 4 Drops in Both Ears Qid For 10 Days  Allergies (verified): 1)  ! Codeine Sulfate (Codeine Sulfate) 2)  Keflex (Cephalexin)   Past History:  Family History: Last updated: 12/26/2006 Family History High cholesterol Family History  Hypertension  Social History: Last updated: 12/26/2006 Married Former Smoker Alcohol use-no  Risk Factors: Caffeine Use: 1 (12/26/2006) Exercise: no (12/26/2006)  Risk Factors: Smoking Status: quit (12/30/2009) Passive Smoke Exposure: no (12/30/2009)  Past medical, surgical, family and social histories (including risk factors) reviewed, and no changes noted (except as noted below).  Past Medical History: Reviewed history from 03/15/2008 and no changes required. Coronary artery disease Diabetes mellitus, type II Hyperlipidemia Hypertension copd stage 2  Past Surgical History: Reviewed history from 04/08/2007 and no changes required. Cervical laminectomy  Percutaneous transluminal coronary angioplasty with atherectomy 1999  Family History: Reviewed history from 12/26/2006 and no changes required. Family History High cholesterol Family History Hypertension  Social History: Reviewed history from 12/26/2006 and no changes required. Married Former Smoker Alcohol use-no  Review of Systems  The patient denies anorexia, fever, weight loss, weight gain, vision loss, decreased hearing, hoarseness, chest pain, syncope, dyspnea on exertion, peripheral edema, prolonged cough, headaches, hemoptysis, abdominal pain, melena, hematochezia, severe indigestion/heartburn, hematuria,  incontinence, genital sores, muscle weakness, suspicious skin lesions, transient blindness, difficulty walking, depression, unusual weight change, abnormal bleeding, enlarged lymph nodes, angioedema, breast masses, and testicular masses.         Flu Vaccine Consent Questions     Do you have a history of severe allergic reactions to this vaccine? no    Any prior history of allergic reactions to egg and/or gelatin? no    Do you have a sensitivity to the preservative Thimersol? no    Do you have a past history of Guillan-Barre Syndrome? no    Do you currently have an acute febrile illness? no    Have you ever  had a severe reaction to latex? no    Vaccine information given and explained to patient? yes    Are you currently pregnant? no    Lot Number:[id number]   Exp Date:09/02/2010   Site Given  Left Deltoid IM   Physical Exam  General:  alert, well-developed, and pale.   Eyes:  pupils equal and pupils round.   Ears:  R ear normal and L ear normal.   Nose:  no external deformity and no nasal discharge.   Mouth:  pharynx pink and moist, no erythema, and no posterior lymphoid hypertrophy.   Neck:  No deformities, masses, or tenderness noted. Lungs:  normal respiratory effort, no accessory muscle use, and normal breath sounds.   Heart:  normal rate, regular rhythm, and no gallop.   Abdomen:  soft, normal bowel sounds, no distention, and no masses.   Pulses:  R and L carotid,radial,femoral,dorsalis pedis and posterior tibial pulses are full and equal bilaterally Extremities:  trace left pedal edema and trace right pedal edema.   Neurologic:  alert & oriented X3 and DTRs symmetrical and normal.    Diabetes Management Exam:    Foot Exam (with socks and/or shoes not present):       Sensory-Pinprick/Light touch:          Left medial foot (L-4): diminished          Left dorsal foot (L-5): diminished          Left lateral foot (S-1): diminished          Right medial foot (L-4): diminished          Right dorsal foot (L-5): diminished          Right lateral foot (S-1): diminished    Eye Exam:       Eye Exam done elsewhere          Date: 06/08/2009          Results: normal          Done by: shapiro   Impression & Recommendations:  Problem # 1:  CHRONIC KIDNEY DISEASE STAGE II (MILD) (ICD-585.2)  Labs Reviewed: BUN: 30 (09/23/2009)   Cr: 1.7 (09/23/2009)    Hgb: 13.3 (02/14/2009)   Hct: 39.1 (02/14/2009)   Ca++: 9.4 (09/23/2009)    TP: 6.4 (02/14/2009)   Alb: 4.0 (02/14/2009)  Orders: Venipuncture [id number]) Specimen Handling (99000) TLB-BMP (Basic Metabolic Panel-BMET)  (99991111)  Problem # 2:  PARKINSON'S DISEASE (ICD-332.0) stable  Problem # 3:  HYPERTENSION (ICD-401.9) Assessment: Unchanged  His updated medication list for this problem includes:    Metoprolol Succinate 50 Mg Tb24 (Metoprolol succinate) .[name]... 1/2 two times a day  BP today: 136/80 Prior BP: 130/80 (09/23/2009)  Prior 10 Yr Risk Heart Disease: N/A (12/26/2006)  Labs Reviewed: K+: 4.6 (09/23/2009) Creat: : 1.7 (09/23/2009)  Chol: 133 (09/23/2009)   HDL: 29.10 (09/23/2009)   LDL: 39 (02/14/2009)   TG: 168.0 (02/14/2009)  Problem # 4:  HYPERLIPIDEMIA (ICD-272.4) Assessment: Unchanged  His updated medication list for this problem includes:    Lipitor 10 Mg Tabs (Atorvastatin calcium) ..... Once daily  Labs Reviewed: SGOT: 23 (02/14/2009)   SGPT: 26 (02/14/2009)  Lipid Goals: Chol Goal: 200 (06/23/2007)   HDL Goal: 40 (06/23/2007)   LDL Goal: 70 (06/23/2007)   TG Goal: 150 (06/23/2007)  Prior 10 Yr Risk Heart Disease: N/A (12/26/2006)   HDL:29.10 (09/23/2009), 29.00 (02/14/2009)  LDL:39 (02/14/2009), 60 (03/15/2008)  Chol:133 (09/23/2009), 102 (02/14/2009)  Trig:168.0 (02/14/2009), 172 (03/15/2008)  Problem # 5:  DIABETES MELLITUS, TYPE II (ICD-250.00) moniter a1c His updated medication list for this problem includes:    Amaryl 2 Mg Tabs (Glimepiride) .[name]... 1 once daily    Therapy Bayer Ec 325 Mg Tbec (Aspirin) ..... Once daily    Metformin Hcl 500 Mg Xr24h-tab (Metformin hcl) .[name]... 1 once daily  Orders: Specimen Handling (99000) TLB-A1C / Hgb A1C (Glycohemoglobin) (83036-A1C)  Labs Reviewed: Creat: 1.7 (09/23/2009)     Last Eye Exam: normal (06/08/2009) Reviewed HgBA1c results: 8.2 (09/23/2009)  7.8 (05/24/2009)  Complete Medication List: 1)  Tramadol Hcl 50 Mg Tabs (Tramadol hcl) .... With tylenol 325mg  every 6 hours prjn pain(this is substitute for dct-n) 2)  Trazodone Hcl 150 Mg Tabs (Trazodone hcl) .... Once daily 3)  Imdur 30 Mg Tb24 (Isosorbide  mononitrate) .... 3 once daily 4)  Metoprolol Succinate 50 Mg Tb24 (Metoprolol succinate) .... 1/2 two times a day 5)  Lipitor 10 Mg Tabs (Atorvastatin calcium) .... Once daily 6)  Zantac 300 Mg Tabs (Ranitidine hcl) .... Two times a day 7)  Amaryl 2 Mg Tabs (Glimepiride) .[name].. 1 once daily 8)  Therapy Bayer Ec 325 Mg Tbec (Aspirin) .... Once daily 9)  Nitro-dur 0.4 Mg/hr Pt24 (Nitroglycerin) .... As needed 10)  Advair Diskus 100-50 Mcg/dose Misc (Fluticasone-salmeterol) .... One puff daily 11)  Metformin Hcl 500 Mg Xr24h-tab (Metformin hcl) .[name].. 1 once daily 12)  Betamethasone Dipropionate 0.05 % Oint (Betamethasone dipropionate) .... Aplly daily as directed 13)  Uroxatral 10 Mg Tb24 (Alfuzosin hcl) .[name].. 1 by mouth daily 14)  Urocit-k 15 15 Meq (1620 Mg) Cr-tabs (Potassium citrate) .[name].. 1 two times a day 15)  Cortisporin 3.5-10000-1 Soln (Neomycin-polymyxin-hc) .... 4 drops in both ears qid for 10 days  Other Orders: Flu Vaccine 69yrs + MEDICARE PATIENTS [id number]) Administration Flu vaccine - MCR [id number])  Hypertension Assessment/Plan:      The patient's hypertensive risk group is category C: Target organ damage and/or diabetes.  Today's blood pressure is 136/80.  His blood pressure goal is < 130/80.  Patient Instructions: 1)  Please schedule a follow-up appointment in 3 months. Prescriptions: ZANTAC 300 MG  TABS (RANITIDINE HCL) two times a day  #60 Each x 6   Entered by:   Allyne Gee, LPN   Authorized by:   Ricard Dillon MD   Signed by:   Allyne Gee, LPN on 624THL   Method used:   Electronically to        [facility] (retail)       [address] [place], [place]  [address]       Ph: [phone]       Fax: [phone]   RxID:  [id number] IMDUR 30 MG  TB24 (ISOSORBIDE MONONITRATE) 3 once daily  #90 Each x 6   Entered by:   Allyne Gee, LPN   Authorized by:   Ricard Dillon MD   Signed by:    Allyne Gee, LPN on 624THL   Method used:   Electronically to        [facility] (retail)       [address], [place], [place]  [address]       Ph: [phone]       Fax: [phone]   RxID:   [id number]    Orders Added: 1)  Flu Vaccine 66yrs + MEDICARE PATIENTS [Q2039] 2)  Administration Flu vaccine - MCR [id number] 3)  Venipuncture [id number])  Est. Patient Level IV [id number] 5)  Specimen Handling [99000] 6)  TLB-BMP (Basic Metabolic Panel-BMET) [id number] 7)  TLB-A1C / Hgb A1C (Glycohemoglobin) [83036-A1C]

## 2010-04-04 NOTE — Letter (Signed)
Summary: [facility]  [facility]   Imported By: Bubba Hales 12/27/2009 08:35:50  _____________________________________________________________________  External Attachment:    Type:   Image     Comment:   External Document

## 2010-04-04 NOTE — Assessment & Plan Note (Signed)
Summary: 4 mo rov/mm   Vital Signs:  Patient profile:   77 year old male Height:      70 inches Weight:      204 pounds BMI:     29.38 Temp:     98.2 degrees F oral Pulse rate:   72 / minute Resp:     14 per minute BP sitting:   130 / 80  (left arm)  Vitals Entered By: Allyne Gee, LPN (July 22, 624THL [id number] AM) CC: roa- Is Patient Diabetic? Yes Did you bring your meter with you today? No   Primary Care Provider:  Ricard Dillon MD  CC:  roa-.  History of Present Illness: folow up HTN and lipids DM CBGs have been in range 120-140  which is high for him adherant to medications parkinsons had been stable  has had some ear pain bilaterally Hypertension Follow-Up      This is a 77 year old man who presents for Hypertension follow-up.  The patient denies lightheadedness, urinary frequency, headaches, edema, impotence, rash, and fatigue.  The patient denies the following associated symptoms: chest pain, chest pressure, exercise intolerance, dyspnea, palpitations, syncope, leg edema, and pedal edema.  Compliance with medications (by patient report) has been near 100%.  The patient reports that dietary compliance has been good.  The patient reports exercising occasionally.    Preventive Screening-Counseling & Management  Alcohol-Tobacco     Smoking Status: quit     Year Quit: 1970     Passive Smoke Exposure: no  Problems Prior to Update: 1)  Cerumen Impaction  (ICD-380.4) 2)  Chronic Kidney Disease Stage II (MILD)  (ICD-585.2) 3)  Preventive Health Care  (ICD-V70.0) 4)  Nephrolithiasis, Recurrent  (ICD-592.0) 5)  Hematospermia  (ICD-608.82) 6)  Actinic Keratosis, Head  (ICD-702.0) 7)  Prostatocystitis  (ICD-601.3) 8)  Chronic Obstructive Pulmonary Disease, Acute Exacerbation  (ICD-491.21) 9)  Cerumen Impaction  (ICD-380.4) 10)  Parkinson's Disease  (ICD-332.0) 11)  Malaise and Fatigue  (ICD-780.79) 12)  Sleep Apnea  (ICD-780.57) 13)  Back Pain  (ICD-724.5) 14)   Hypertension  (ICD-401.9) 15)  Hyperlipidemia  (ICD-272.4) 16)  Diabetes Mellitus, Type II  (ICD-250.00) 17)  Coronary Artery Disease  (ICD-414.00)  Current Problems (verified): 1)  Cerumen Impaction  (ICD-380.4) 2)  Chronic Kidney Disease Stage II (MILD)  (ICD-585.2) 3)  Preventive Health Care  (ICD-V70.0) 4)  Nephrolithiasis, Recurrent  (ICD-592.0) 5)  Hematospermia  (ICD-608.82) 6)  Actinic Keratosis, Head  (ICD-702.0) 7)  Prostatocystitis  (ICD-601.3) 8)  Chronic Obstructive Pulmonary Disease, Acute Exacerbation  (ICD-491.21) 9)  Cerumen Impaction  (ICD-380.4) 10)  Parkinson's Disease  (ICD-332.0) 11)  Malaise and Fatigue  (ICD-780.79) 12)  Sleep Apnea  (ICD-780.57) 13)  Back Pain  (ICD-724.5) 14)  Hypertension  (ICD-401.9) 15)  Hyperlipidemia  (ICD-272.4) 16)  Diabetes Mellitus, Type II  (ICD-250.00) 17)  Coronary Artery Disease  (ICD-414.00)  Medications Prior to Update: 1)  Tramadol Hcl 50 Mg Tabs (Tramadol Hcl) .... With Tylenol 325mg  Every 6 Hours Prjn Pain(This Is Substitute For Dct-N) 2)  Trazodone Hcl 150 Mg  Tabs (Trazodone Hcl) .... Once Daily 3)  Imdur 30 Mg  Tb24 (Isosorbide Mononitrate) .... 3 Once Daily 4)  Metoprolol Succinate 50 Mg  Tb24 (Metoprolol Succinate) .... 1/2 Two Times A Day 5)  Lipitor 10 Mg  Tabs (Atorvastatin Calcium) .... Once Daily 6)  Zantac 300 Mg  Tabs (Ranitidine Hcl) .... Two Times A Day 7)  Amaryl 2 Mg  Tabs (  Glimepiride) .[name].. 1 Once Daily 8)  Therapy Bayer Ec 325 Mg  Tbec (Aspirin) .... Once Daily 9)  Nitro-Dur 0.4 Mg/hr  Pt24 (Nitroglycerin) .... As Needed 10)  Advair Diskus 100-50 Mcg/dose Misc (Fluticasone-Salmeterol) .... One Puff Daily 11)  Metformin Hcl 500 Mg Xr24h-Tab (Metformin Hcl) .[name].. 1 Once Daily 12)  Betamethasone Dipropionate 0.05 % Oint (Betamethasone Dipropionate) .... Aplly Daily As Directed 13)  Doxycycline Hyclate 100 Mg Caps (Doxycycline Hyclate) .... One By Mouth Daily 14)  Uroxatral 10 Mg Tb24 (Alfuzosin Hcl) .[name]..  1 By Mouth Daily 15)  Urocrit K 15 .[name].. 1 Two Times A Day  Current Medications (verified): 1)  Tramadol Hcl 50 Mg Tabs (Tramadol Hcl) .... With Tylenol 325mg  Every 6 Hours Prjn Pain(This Is Substitute For Dct-N) 2)  Trazodone Hcl 150 Mg  Tabs (Trazodone Hcl) .... Once Daily 3)  Imdur 30 Mg  Tb24 (Isosorbide Mononitrate) .... 3 Once Daily 4)  Metoprolol Succinate 50 Mg  Tb24 (Metoprolol Succinate) .... 1/2 Two Times A Day 5)  Lipitor 10 Mg  Tabs (Atorvastatin Calcium) .... Once Daily 6)  Zantac 300 Mg  Tabs (Ranitidine Hcl) .... Two Times A Day 7)  Amaryl 2 Mg  Tabs (Glimepiride) .[name].. 1 Once Daily 8)  Therapy Bayer Ec 325 Mg  Tbec (Aspirin) .... Once Daily 9)  Nitro-Dur 0.4 Mg/hr  Pt24 (Nitroglycerin) .... As Needed 10)  Advair Diskus 100-50 Mcg/dose Misc (Fluticasone-Salmeterol) .... One Puff Daily 11)  Metformin Hcl 500 Mg Xr24h-Tab (Metformin Hcl) .[name].. 1 Once Daily 12)  Betamethasone Dipropionate 0.05 % Oint (Betamethasone Dipropionate) .... Aplly Daily As Directed 13)  Uroxatral 10 Mg Tb24 (Alfuzosin Hcl) .[name].. 1 By Mouth Daily 14)  Urocit-K 15 15 Meq (1620 Mg) Cr-Tabs (Potassium Citrate) .[name].. 1 Two Times A Day  Allergies (verified): 1)  ! Codeine Sulfate (Codeine Sulfate) 2)  Keflex (Cephalexin)  Past History:  Family History: Last updated: 12/26/2006 Family History High cholesterol Family History Hypertension  Social History: Last updated: 12/26/2006 Married Former Smoker Alcohol use-no  Risk Factors: Caffeine Use: 1 (12/26/2006) Exercise: no (12/26/2006)  Risk Factors: Smoking Status: quit (09/23/2009) Passive Smoke Exposure: no (09/23/2009)  Past medical, surgical, family and social histories (including risk factors) reviewed, and no changes noted (except as noted below).  Past Medical History: Reviewed history from 03/15/2008 and no changes required. Coronary artery disease Diabetes mellitus, type II Hyperlipidemia Hypertension copd stage 2  Past Surgical  History: Reviewed history from 04/08/2007 and no changes required. Cervical laminectomy  Percutaneous transluminal coronary angioplasty with atherectomy 1999  Family History: Reviewed history from 12/26/2006 and no changes required. Family History High cholesterol Family History Hypertension  Social History: Reviewed history from 12/26/2006 and no changes required. Married Former Smoker Alcohol use-no  Review of Systems  The patient denies anorexia, fever, weight loss, weight gain, vision loss, decreased hearing, hoarseness, chest pain, syncope, dyspnea on exertion, peripheral edema, prolonged cough, headaches, hemoptysis, abdominal pain, melena, hematochezia, severe indigestion/heartburn, hematuria, incontinence, genital sores, muscle weakness, suspicious skin lesions, transient blindness, difficulty walking, depression, unusual weight change, abnormal bleeding, enlarged lymph nodes, angioedema, and breast masses.    Physical Exam  General:  alert, well-developed, and pale.   Head:  normocephalic and atraumatic.   Eyes:  pupils equal and pupils round.   Ears:  R canal inflamed, R Canal drainage, R cerumen impaction, and L Cerumen impaction.   Nose:  no external deformity and no nasal discharge.   Neck:  No deformities, masses, or tenderness noted. Lungs:  normal respiratory effort, no accessory muscle use, and normal breath sounds.   Heart:  normal rate, regular rhythm, and no gallop.   Abdomen:  soft, normal bowel sounds, no distention, and no masses.   Pulses:  R and L carotid,radial,femoral,dorsalis pedis and posterior tibial pulses are full and equal bilaterally Extremities:  trace left pedal edema and trace right pedal edema.   Neurologic:  alert & oriented X3 and DTRs symmetrical and normal.     Impression & Recommendations:  Problem # 1:  CHRONIC KIDNEY DISEASE STAGE II (MILD) (ICD-585.2)  monitering labs today Labs Reviewed: BUN: 24 (05/24/2009)   Cr: 1.8  (05/24/2009)    Hgb: 13.3 (02/14/2009)   Hct: 39.1 (02/14/2009)   Ca++: 9.2 (05/24/2009)    TP: 6.4 (02/14/2009)   Alb: 4.0 (02/14/2009)  Orders: Venipuncture [id number]) TLB-BMP (Basic Metabolic Panel-BMET) (99991111)  Problem # 2:  PARKINSON'S DISEASE (ICD-332.0) stable  Problem # 3:  HYPERTENSION (ICD-401.9)  His updated medication list for this problem includes:    Metoprolol Succinate 50 Mg Tb24 (Metoprolol succinate) .[name]... 1/2 two times a day  BP today: 130/80 Prior BP: 130/80 (05/24/2009)  Prior 10 Yr Risk Heart Disease: N/A (12/26/2006)  Labs Reviewed: K+: 5.1 (05/24/2009) Creat: : 1.8 (05/24/2009)   Chol: 102 (02/14/2009)   HDL: 29.00 (02/14/2009)   LDL: 39 (02/14/2009)   TG: 168.0 (02/14/2009)  Problem # 4:  HYPERLIPIDEMIA (ICD-272.4)  His updated medication list for this problem includes:    Lipitor 10 Mg Tabs (Atorvastatin calcium) ..... Once daily  Labs Reviewed: SGOT: 23 (02/14/2009)   SGPT: 26 (02/14/2009)  Lipid Goals: Chol Goal: 200 (06/23/2007)   HDL Goal: 40 (06/23/2007)   LDL Goal: 70 (06/23/2007)   TG Goal: 150 (06/23/2007)  Prior 10 Yr Risk Heart Disease: N/A (12/26/2006)   HDL:29.00 (02/14/2009), 28.8 (03/15/2008)  LDL:39 (02/14/2009), 60 (03/15/2008)  Chol:102 (02/14/2009), 123 (03/15/2008)  Trig:168.0 (02/14/2009), 172 (03/15/2008)  Orders: TLB-Cholesterol, HDL (83718-HDL) TLB-Cholesterol, Direct LDL (83721-DIRLDL) TLB-Cholesterol, Total (82465-CHO) TLB-TSH (Thyroid Stimulating Hormone) (84443-TSH)  Problem # 5:  DIABETES MELLITUS, TYPE II (ICD-250.00) Assessment: Deteriorated feels thjat cbgs have increased His updated medication list for this problem includes:    Amaryl 2 Mg Tabs (Glimepiride) .[name]... 1 once daily    Therapy Bayer Ec 325 Mg Tbec (Aspirin) ..... Once daily    Metformin Hcl 500 Mg Xr24h-tab (Metformin hcl) .[name]... 1 once daily  Labs Reviewed: Creat: 1.8 (05/24/2009)     Last Eye Exam: normal (05/23/2009) Reviewed HgBA1c  results: 7.8 (05/24/2009)  7.3 (08/13/2008)  Orders: TLB-A1C / Hgb A1C (Glycohemoglobin) (83036-A1C)  Problem # 6:  CERUMEN IMPACTION (ICD-380.4) wax in both ears with apparent seboreic changes cannals inflamed informed consent obtained, using a cerumin spoon the wax impaction was dislodged and the canal was lavaged with 1/2 peroxide and 1/2 warm water solution until clear  Orders: Cerumen Impaction Removal [id number])  Complete Medication List: 1)  Tramadol Hcl 50 Mg Tabs (Tramadol hcl) .... With tylenol 325mg  every 6 hours prjn pain(this is substitute for dct-n) 2)  Trazodone Hcl 150 Mg Tabs (Trazodone hcl) .... Once daily 3)  Imdur 30 Mg Tb24 (Isosorbide mononitrate) .... 3 once daily 4)  Metoprolol Succinate 50 Mg Tb24 (Metoprolol succinate) .... 1/2 two times a day 5)  Lipitor 10 Mg Tabs (Atorvastatin calcium) .... Once daily 6)  Zantac 300 Mg Tabs (Ranitidine hcl) .... Two times a day 7)  Amaryl 2 Mg Tabs (Glimepiride) .[name].. 1 once daily 8)  Therapy Bayer Ec 325 Mg  Tbec (Aspirin) .... Once daily 9)  Nitro-dur 0.4 Mg/hr Pt24 (Nitroglycerin) .... As needed 10)  Advair Diskus 100-50 Mcg/dose Misc (Fluticasone-salmeterol) .... One puff daily 11)  Metformin Hcl 500 Mg Xr24h-tab (Metformin hcl) .[name].. 1 once daily 12)  Betamethasone Dipropionate 0.05 % Oint (Betamethasone dipropionate) .... Aplly daily as directed 13)  Uroxatral 10 Mg Tb24 (Alfuzosin hcl) .[name].. 1 by mouth daily 14)  Urocit-k 15 15 Meq (1620 Mg) Cr-tabs (Potassium citrate) .[name].. 1 two times a day 15)  Cortisporin 3.5-10000-1 Soln (Neomycin-polymyxin-hc) .... 4 drops in both ears qid for 10 days  Patient Instructions: 1)  Please schedule a follow-up appointment in 3 months. Prescriptions: CORTISPORIN 3.5-10000-1 SOLN (NEOMYCIN-POLYMYXIN-HC) 4 drops in both ears qid for 10 days  #10cc x 1   Entered and Authorized by:   Ricard Dillon MD   Signed by:   Ricard Dillon MD on 09/23/2009   Method used:   Electronically to         [id number] (retail)       [address] [place], [place]  [address]       Ph: [phone]       Fax: [phone]   RxID:   [id number]   Appended Document: Orders Update     Clinical Lists Changes  Orders: Added new Service order of Specimen Handling ([id number]) - Signed

## 2010-04-06 NOTE — Assessment & Plan Note (Signed)
Summary: 3 month fup//ccm   Vital Signs:  Patient profile:   77 year old male Height:      70 inches Weight:      201 pounds BMI:     28.94 Temp:     98.2 degrees F oral Pulse rate:   60 / minute Resp:     14 per minute BP sitting:   130 / 80  (left arm)  Vitals Entered By: Allyne Gee, LPN (January 27, [id number] 9:51 AM) CC: ROA--STATES BLOOD SUGARS ARE RUNNING GREAT- TO URGENT CARE FOR TICK BITE-, Hypertension Management Is Patient Diabetic? Yes Did you bring your meter with you today? No   Primary Care Provider:  Ricard Dillon MD  CC:  ROA--STATES BLOOD SUGARS ARE RUNNING GREAT- TO URGENT CARE FOR TICK BITE- and Hypertension Management.  History of Present Illness:  the patient is seen for followup of diabetes in an A1c report in October of 8.1 at that point his blood glucoses were in the 200 range since then he has noted a steady decline in his CBGs and he reports that his blood glucose this morning was in the 80-90 range.  the patient states that he has been walking beagles in the field every day which is excellent exercise this  is the resulting cause of the lower blood glucoses.  Hypertension History:      He denies headache, chest pain, palpitations, dyspnea with exertion, orthopnea, PND, peripheral edema, visual symptoms, neurologic problems, syncope, and side effects from treatment.        Positive major cardiovascular risk factors include male age 45 years old or older, diabetes, hyperlipidemia, and hypertension.  Negative major cardiovascular risk factors include negative family history for ischemic heart disease and non-tobacco-user status.        Positive history for target organ damage include ASHD (either angina/prior MI/prior CABG) and renal insufficiency.  Further assessment for target organ damage reveals no history of stroke/TIA or peripheral vascular disease.     Preventive Screening-Counseling & Management  Alcohol-Tobacco     Smoking Status: quit  Year Quit: 1970     Passive Smoke Exposure: no     Tobacco Counseling: to remain off tobacco products  Problems Prior to Update: 1)  Cerumen Impaction  (ICD-380.4) 2)  Chronic Kidney Disease Stage II (MILD)  (ICD-585.2) 3)  Preventive Health Care  (ICD-V70.0) 4)  Nephrolithiasis, Recurrent  (ICD-592.0) 5)  Hematospermia  (ICD-608.82) 6)  Actinic Keratosis, Head  (ICD-702.0) 7)  Prostatocystitis  (ICD-601.3) 8)  Chronic Obstructive Pulmonary Disease, Acute Exacerbation  (ICD-491.21) 9)  Cerumen Impaction  (ICD-380.4) 10)  Parkinson's Disease  (ICD-332.0) 11)  Malaise and Fatigue  (ICD-780.79) 12)  Sleep Apnea  (ICD-780.57) 13)  Back Pain  (ICD-724.5) 14)  Hypertension  (ICD-401.9) 15)  Hyperlipidemia  (ICD-272.4) 16)  Diabetes Mellitus, Type II  (ICD-250.00) 17)  Coronary Artery Disease  (ICD-414.00)  Current Problems (verified): 1)  Cerumen Impaction  (ICD-380.4) 2)  Chronic Kidney Disease Stage II (MILD)  (ICD-585.2) 3)  Preventive Health Care  (ICD-V70.0) 4)  Nephrolithiasis, Recurrent  (ICD-592.0) 5)  Hematospermia  (ICD-608.82) 6)  Actinic Keratosis, Head  (ICD-702.0) 7)  Prostatocystitis  (ICD-601.3) 8)  Chronic Obstructive Pulmonary Disease, Acute Exacerbation  (ICD-491.21) 9)  Cerumen Impaction  (ICD-380.4) 10)  Parkinson's Disease  (ICD-332.0) 11)  Malaise and Fatigue  (ICD-780.79) 12)  Sleep Apnea  (ICD-780.57) 13)  Back Pain  (ICD-724.5) 14)  Hypertension  (ICD-401.9) 15)  Hyperlipidemia  (ICD-272.4) 16)  Diabetes Mellitus, Type II  (ICD-250.00) 17)  Coronary Artery Disease  (ICD-414.00)  Medications Prior to Update: 1)  Tramadol Hcl 50 Mg Tabs (Tramadol Hcl) .... With Tylenol 325mg  Every 6 Hours Prjn Pain(This Is Substitute For Dct-N) 2)  Trazodone Hcl 150 Mg  Tabs (Trazodone Hcl) .... Once Daily 3)  Imdur 30 Mg  Tb24 (Isosorbide Mononitrate) .... 3 Once Daily 4)  Metoprolol Succinate 50 Mg  Tb24 (Metoprolol Succinate) .... 1/2 Two Times A Day 5)  Lipitor 10  Mg  Tabs (Atorvastatin Calcium) .... Once Daily 6)  Zantac 300 Mg  Tabs (Ranitidine Hcl) .... Two Times A Day 7)  Amaryl 2 Mg  Tabs (Glimepiride) .[name].. 1 Once Daily 8)  Therapy Bayer Ec 325 Mg  Tbec (Aspirin) .... Once Daily 9)  Nitro-Dur 0.4 Mg/hr  Pt24 (Nitroglycerin) .... As Needed 10)  Advair Diskus 100-50 Mcg/dose Misc (Fluticasone-Salmeterol) .... One Puff Daily 11)  Metformin Hcl 500 Mg Xr24h-Tab (Metformin Hcl) .[name].. 1 Once Daily 12)  Betamethasone Dipropionate 0.05 % Oint (Betamethasone Dipropionate) .... Aplly Daily As Directed 13)  Uroxatral 10 Mg Tb24 (Alfuzosin Hcl) .[name].. 1 By Mouth Daily 14)  Urocit-K 15 15 Meq (1620 Mg) Cr-Tabs (Potassium Citrate) .[name].. 1 Two Times A Day 15)  Cortisporin 3.5-10000-1 Soln (Neomycin-Polymyxin-Hc) .... 4 Drops in Both Ears Qid For 10 Days  Current Medications (verified): 1)  Tramadol Hcl 50 Mg Tabs (Tramadol Hcl) .... With Tylenol 325mg  Every 6 Hours Prjn Pain(This Is Substitute For Dct-N) 2)  Trazodone Hcl 150 Mg  Tabs (Trazodone Hcl) .... Once Daily 3)  Imdur 30 Mg  Tb24 (Isosorbide Mononitrate) .... 3 Once Daily 4)  Metoprolol Succinate 50 Mg  Tb24 (Metoprolol Succinate) .... 1/2 Two Times A Day 5)  Lipitor 10 Mg  Tabs (Atorvastatin Calcium) .... Once Daily 6)  Zantac 300 Mg  Tabs (Ranitidine Hcl) .... Two Times A Day 7)  Amaryl 2 Mg  Tabs (Glimepiride) .[name].. 1 Once Daily 8)  Therapy Bayer Ec 325 Mg  Tbec (Aspirin) .... Once Daily 9)  Nitro-Dur 0.4 Mg/hr  Pt24 (Nitroglycerin) .... As Needed 10)  Advair Diskus 100-50 Mcg/dose Misc (Fluticasone-Salmeterol) .... One Puff Daily 11)  Metformin Hcl 500 Mg Xr24h-Tab (Metformin Hcl) .[name].. 1 Once Daily 12)  Betamethasone Dipropionate 0.05 % Oint (Betamethasone Dipropionate) .... Aplly Daily As Directed 13)  Uroxatral 10 Mg Tb24 (Alfuzosin Hcl) .[name].. 1 By Mouth Daily 14)  Urocit-K 15 15 Meq (1620 Mg) Cr-Tabs (Potassium Citrate) .[name].. 1 Two Times A Day  Allergies (verified): 1)  ! Codeine Sulfate (Codeine  Sulfate) 2)  Keflex (Cephalexin)  Past History:  Family History: Last updated: 12/26/2006 Family History High cholesterol Family History Hypertension  Social History: Last updated: 12/26/2006 Married Former Smoker Alcohol use-no  Risk Factors: Caffeine Use: 1 (12/26/2006) Exercise: no (12/26/2006)  Risk Factors: Smoking Status: quit (03/31/2010) Passive Smoke Exposure: no (03/31/2010)  Past medical, surgical, family and social histories (including risk factors) reviewed, and no changes noted (except as noted below).  Past Medical History: Reviewed history from 03/15/2008 and no changes required. Coronary artery disease Diabetes mellitus, type II Hyperlipidemia Hypertension copd stage 2  Past Surgical History: Reviewed history from 04/08/2007 and no changes required. Cervical laminectomy  Percutaneous transluminal coronary angioplasty with atherectomy 1999  Family History: Reviewed history from 12/26/2006 and no changes required. Family History High cholesterol Family History Hypertension  Social History: Reviewed history from 12/26/2006 and no changes required. Married Former Smoker Alcohol use-no  Review of Systems  The patient  denies anorexia, fever, weight loss, weight gain, vision loss, decreased hearing, hoarseness, chest pain, syncope, dyspnea on exertion, peripheral edema, prolonged cough, headaches, hemoptysis, abdominal pain, melena, hematochezia, severe indigestion/heartburn, hematuria, incontinence, genital sores, muscle weakness, suspicious skin lesions, transient blindness, difficulty walking, depression, unusual weight change, abnormal bleeding, enlarged lymph nodes, angioedema, breast masses, and testicular masses.    Physical Exam  General:  alert, well-developed, and pale.   Head:  normocephalic and atraumatic.   Eyes:  pupils equal and pupils round.   Ears:  R ear normal and L ear normal.   Nose:  no external deformity and no nasal  discharge.   Mouth:  pharynx pink and moist, no erythema, and no posterior lymphoid hypertrophy.   Neck:  No deformities, masses, or tenderness noted. Lungs:  normal respiratory effort, no accessory muscle use, and normal breath sounds.   Heart:  normal rate, regular rhythm, and no gallop.   Abdomen:  soft, normal bowel sounds, no distention, and no masses.     Impression & Recommendations:  Problem # 1:  CHRONIC KIDNEY DISEASE STAGE II (MILD) (ICD-585.2) Assessment Unchanged  Labs Reviewed: BUN: 29 (12/30/2009)   Cr: 2.0 (12/30/2009)    Hgb: 13.3 (02/14/2009)   Hct: 39.1 (02/14/2009)   Ca++: 9.2 (12/30/2009)    TP: 6.4 (02/14/2009)   Alb: 4.0 (02/14/2009)  Orders: Venipuncture [id number]) TLB-BMP (Basic Metabolic Panel-BMET) (99991111)  Problem # 2:  PARKINSON'S DISEASE (ICD-332.0) Assessment: Unchanged stable  Problem # 3:  HYPERTENSION (ICD-401.9) Assessment: Improved  His updated medication list for this problem includes:    Metoprolol Tartrate 25 Mg Tabs (Metoprolol tartrate) ..... One by mouth bid  BP today: 130/80 Prior BP: 136/80 (12/30/2009)  Prior 10 Yr Risk Heart Disease: N/A (12/26/2006)  Labs Reviewed: K+: 4.5 (12/30/2009) Creat: : 2.0 (12/30/2009)   Chol: 133 (09/23/2009)   HDL: 29.10 (09/23/2009)   LDL: 39 (02/14/2009)   TG: 168.0 (02/14/2009)  Problem # 4:  DIABETES MELLITUS, TYPE II (ICD-250.00) Assessment: Improved  His updated medication list for this problem includes:    Amaryl 2 Mg Tabs (Glimepiride) .[name]... 1 once daily    Therapy Bayer Ec 325 Mg Tbec (Aspirin) ..... Once daily    Metformin Hcl 500 Mg Xr24h-tab (Metformin hcl) .[name]... 1 once daily  Labs Reviewed: Creat: 2.0 (12/30/2009)     Last Eye Exam: normal (06/08/2009) Reviewed HgBA1c results: 8.1 (12/30/2009)  8.2 (09/23/2009)  Orders: TLB-A1C / Hgb A1C (Glycohemoglobin) (83036-A1C) TLB-Microalbumin/Creat Ratio, Urine (82043-MALB)  Complete Medication List: 1)  Tramadol Hcl 50 Mg  Tabs (Tramadol hcl) .... With tylenol 325mg  every 6 hours prjn pain(this is substitute for dct-n) 2)  Trazodone Hcl 150 Mg Tabs (Trazodone hcl) .... Once daily 3)  Imdur 30 Mg Tb24 (Isosorbide mononitrate) .... 3 once daily 4)  Metoprolol Tartrate 25 Mg Tabs (Metoprolol tartrate) .... One by mouth bid 5)  Lipitor 10 Mg Tabs (Atorvastatin calcium) .... Once daily 6)  Zantac 300 Mg Tabs (Ranitidine hcl) .... Two times a day 7)  Amaryl 2 Mg Tabs (Glimepiride) .[name].. 1 once daily 8)  Therapy Bayer Ec 325 Mg Tbec (Aspirin) .... Once daily 9)  Nitro-dur 0.4 Mg/hr Pt24 (Nitroglycerin) .... As needed 10)  Advair Diskus 100-50 Mcg/dose Misc (Fluticasone-salmeterol) .... One puff daily 11)  Metformin Hcl 500 Mg Xr24h-tab (Metformin hcl) .[name].. 1 once daily 12)  Betamethasone Dipropionate 0.05 % Oint (Betamethasone dipropionate) .... Aplly daily as directed 13)  Uroxatral 10 Mg Tb24 (Alfuzosin hcl) .[name].. 1 by mouth daily 14)  Urocit-k 15 15 Meq (1620 Mg) Cr-tabs (Potassium citrate) .[name].. 1 two times a day  Hypertension Assessment/Plan:      The patient's hypertensive risk group is category C: Target organ damage and/or diabetes.  Today's blood pressure is 130/80.  His blood pressure goal is < 130/80.  Patient Instructions: 1)  Please schedule a follow-up appointment in 3 months. Prescriptions: METOPROLOL TARTRATE 25 MG TABS (METOPROLOL TARTRATE) one by mouth BID  #60 x 11   Entered and Authorized by:   Ricard Dillon MD   Signed by:   Ricard Dillon MD on 03/31/2010   Method used:   Electronically to        [facility] (retail)       [address] [place], [place]  [address]       Ph: [phone]       Fax: [phone]   RxID:   [id number]    Orders Added: 1)  Est. Patient Level IV [id number] 2)  Venipuncture [id number] 3)  TLB-BMP (Basic Metabolic Panel-BMET) [id number] 4)  TLB-A1C / Hgb A1C (Glycohemoglobin) [83036-A1C] 5)   TLB-Microalbumin/Creat Ratio, Urine [82043-MALB]  Appended Document: Orders Update     Clinical Lists Changes  Orders: Added new Service order of Specimen Handling ([id number]) - Signed

## 2010-04-19 ENCOUNTER — Other Ambulatory Visit: Payer: Self-pay | Admitting: Internal Medicine

## 2010-05-25 ENCOUNTER — Other Ambulatory Visit: Payer: Self-pay | Admitting: Internal Medicine

## 2010-05-25 DIAGNOSIS — E119 Type 2 diabetes mellitus without complications: Secondary | ICD-10-CM

## 2010-06-07 LAB — URINE MICROSCOPIC-ADD ON

## 2010-06-07 LAB — URINALYSIS, ROUTINE W REFLEX MICROSCOPIC
Bilirubin Urine: NEGATIVE
Glucose, UA: NEGATIVE mg/dL
Hgb urine dipstick: NEGATIVE
Nitrite: NEGATIVE
Urobilinogen, UA: 0.2 mg/dL (ref 0.0–1.0)

## 2010-06-30 ENCOUNTER — Ambulatory Visit (INDEPENDENT_AMBULATORY_CARE_PROVIDER_SITE_OTHER): Payer: PRIVATE HEALTH INSURANCE | Admitting: Internal Medicine

## 2010-06-30 ENCOUNTER — Encounter: Payer: Self-pay | Admitting: Internal Medicine

## 2010-06-30 DIAGNOSIS — G20 Parkinson's disease: Secondary | ICD-10-CM

## 2010-06-30 DIAGNOSIS — I10 Essential (primary) hypertension: Secondary | ICD-10-CM

## 2010-06-30 DIAGNOSIS — J441 Chronic obstructive pulmonary disease with (acute) exacerbation: Secondary | ICD-10-CM

## 2010-06-30 DIAGNOSIS — E119 Type 2 diabetes mellitus without complications: Secondary | ICD-10-CM

## 2010-06-30 DIAGNOSIS — K219 Gastro-esophageal reflux disease without esophagitis: Secondary | ICD-10-CM

## 2010-06-30 LAB — BASIC METABOLIC PANEL
CO2: 30 mEq/L (ref 19–32)
Calcium: 10.4 mg/dL (ref 8.4–10.5)
Creatinine, Ser: 2.1 mg/dL — ABNORMAL HIGH (ref 0.4–1.5)
GFR: 33.76 mL/min — ABNORMAL LOW (ref >60.00)

## 2010-06-30 LAB — LIPID PANEL
Cholesterol: 167 mg/dL (ref 0–200)
Total CHOL/HDL Ratio: 5

## 2010-06-30 MED ORDER — ISOSORBIDE MONONITRATE ER 30 MG PO TB24: 30.0000 mg | ORAL_TABLET | Freq: Three times a day (TID) | ORAL | Status: DC

## 2010-06-30 MED ORDER — RANITIDINE HCL 300 MG PO TABS: 300.0000 mg | ORAL_TABLET | Freq: Two times a day (BID) | ORAL | Status: DC

## 2010-06-30 MED ORDER — METOPROLOL TARTRATE 50 MG PO TABS: 25.0000 mg | ORAL_TABLET | Freq: Two times a day (BID) | ORAL | Status: DC

## 2010-06-30 NOTE — Progress Notes (Signed)
  Subjective:    Patient ID: Roy [name], male    DOB: 11-Jun-1932, 77 y.o.   MRN: [id number]  HPI Patient is doing well he presents for followup of hypertension hyperlipidemia known coronary artery disease adult onset diabetes and benign prostatic hypertrophy.  He has noted an increased cough to some of his medications and he is concerned that he'll be able to continue to follow them we did spend time with the patient today reviewing his medications as consult.  He denies any chest pain shortness of breath PND orthopnea generally feels well he has been housework recently because of wife has an injury he states he has not experienced any chest pain with housework.      Review of Systems  Constitutional: Negative for fever and fatigue.  HENT: Negative for hearing loss, congestion, neck pain and postnasal drip.   Eyes: Negative for discharge, redness and visual disturbance.  Respiratory: Negative for cough, shortness of breath and wheezing.   Cardiovascular: Negative for leg swelling.  Gastrointestinal: Negative for abdominal pain, constipation and abdominal distention.  Genitourinary: Negative for urgency and frequency.  Musculoskeletal: Negative for joint swelling and arthralgias.  Skin: Negative for color change and rash.  Neurological: Negative for weakness and light-headedness.  Hematological: Negative for adenopathy.  Psychiatric/Behavioral: Negative for behavioral problems.   Past Medical History  Diagnosis Date  . CAD (coronary artery disease)   . Diabetes mellitus, type 2   . Hyperlipidemia   . Hypertension   . COPD (chronic obstructive pulmonary disease)     Stage 2   Past Surgical History  Procedure Date  . Cervical laminectomy     reports that he quit smoking about 52 years ago. He does not have any smokeless tobacco history on file. He reports that he does not drink alcohol or use illicit drugs. family history includes ALS in his father. Allergies  Allergen  Reactions  . Cephalexin     REACTION: rash  . Codeine Sulfate     REACTION: chest pain       Objective:   Physical Exam  Constitutional: He appears well-developed and well-nourished.  HENT:  Head: Normocephalic and atraumatic.  Eyes: Conjunctivae are normal. Pupils are equal, round, and reactive to light.  Neck: Normal range of motion. Neck supple.  Cardiovascular: Normal rate and regular rhythm.   Pulmonary/Chest: Effort normal and breath sounds normal.  Abdominal: Soft. Bowel sounds are normal.          Assessment & Plan:  Patient is an elderly 77 year old gentleman with multiple medical problems including hypertension hyperlipidemia coronary disease and diabetes.  We will be monitoring his basic metabolic panel for renal function a cholesterol panel and A1c today we have refilled his medications and reviewed them with him we have also adjusted some medications to generic for cost savings and compliance issues.  We spent more than 30 minutes face-to-face with this patient

## 2010-07-04 ENCOUNTER — Encounter: Payer: Self-pay | Admitting: Internal Medicine

## 2010-07-18 ENCOUNTER — Other Ambulatory Visit: Payer: Self-pay | Admitting: *Deleted

## 2010-07-18 MED ORDER — ATORVASTATIN CALCIUM 10 MG PO TABS: 10.0000 mg | ORAL_TABLET | Freq: Every day | ORAL | Status: DC

## 2010-07-18 NOTE — Discharge Summary (Signed)
NAME:  Roy [name], Roy [name]                 ACCOUNT NO.:  [id number]   MEDICAL RECORD NO.:  [id number]          PATIENT TYPE:  INP   LOCATION:  10                         FACILITY:  [facility]   PHYSICIAN:  Valerie A. Asa Lente, MDDATE OF BIRTH:  [birth year]   DATE OF ADMISSION:  03/01/2007  DATE OF DISCHARGE:  03/01/2007                               DISCHARGE SUMMARY   DISCHARGE DIAGNOSES:  1. Left upper lobe pneumonia with hemoptysis, improved.  Continue      outpatient antibiotic therapy for a total 10-day course, see      details below.  2. Type 2 diabetes.  3. Hypertension.  4. Dyslipidemia.  5. History of coronary disease status post PTCA in 1999 and November      2000, no anginal symptoms.  Continue home management.  6. History of obstructive sleep apnea.  7. Weakness due to combination of above, improved, status post PT      evaluation, no home health needs.   DISCHARGE MEDICATIONS:  1. Avelox 400 mg once daily for 7 additional days to complete 10-day      course, next due December 31.  2. Other medications are as prior to admission and include trazodone      150 mg at bedtime.  3. Imdur 90 mg daily.  4. Metoprolol 25 mg b.i.d.  5. Lipitor 10 mg daily.  6. Ranitidine 300 mg b.i.d.  7. Amaryl 2 mg once daily.  8. Aspirin 325 mg daily.  9. Supplements including zinc, vitamin C, fish oil daily as prior to      admission.  10.Tylenol as prior to admission.  11.Nitrostat p.r.n.   HOSPITAL FOLLOWUP:  With primary care physician, Dr. Benay Pillow,  scheduled for Monday, January 12, at 9:30 a.m.  The patient and his wife  have been instructed to notify MD or on-call physician if there are  problems at home after discharge and to return to the emergency room if  recurrent fever, worsening hemoptysis or other problems.   CONDITION ON DISCHARGE:  Medically improved and stable for discharge,  having reached maximal hospital benefit.  O2 saturation ranging 95-98%  on room air at rest and  exertion.   HOSPITAL COURSE BY PROBLEM:  1. Left upper lobe pneumonia.  The patient is a pleasant 77 year old      gentleman with multiple medical issues that are as listed above in      a chronic state of well-controlled symptoms, admitted outpatient      setting from the Urgent Care due to persistent cough and fever and      changes of chest x-ray, evidence for pneumonia in the left upper      lobe.  He was febrile over 100 with mild hypoxia at 94% room air at      rest and due to his multiple co-morbidities was admitted for      hospital treatment of pneumonia.  He was begun on IV Avelox as well      as nebulizers and given oxygen as needed.  Followup chest x-ray was      not  performed during this hospitalization given clinical      improvement but would also recommend follow up by his primary MD      after completion of antibiotics to ensure clearing of the left      upper lobe changes, especially in the setting of hemoptysis which      is now resolved.  He has defervesced and had good O2 saturation and      is now felt stable for discharge home.  Due to the associated      weakness with his pneumonia he was seen by physical therapy who had      no recommended home health needs.  O2 saturation was monitored pre,      during and post exertion with PT and found to be normal as listed      above.  These instructions have been reviewed with the patient and      wife at his bedside who understand and agree with plans for      discharge home to continue treatment.  His other medical issues are      multiple and as listed above.  There have been no acute issues in      these regards and his medical management is as listed previously.      Valerie A. Asa Lente, MD  Electronically Signed     VAL/MEDQ  D:  03/04/2007  T:  03/04/2007  Job:  [id number]

## 2010-07-18 NOTE — Assessment & Plan Note (Signed)
[facility] OFFICE NOTE   CLOVIS, MADGE                        MRN:          [id number]  DATE:03/01/2007                            DOB:          1932-04-23    HOSPITAL ADMISSION HISTORY AND PHYSICAL   CHIEF COMPLAINT:  Cough and fever.   Roy [name] is a 77 year old male, who presented to our Saturday clinic  complaining of a three-day history of a productive cough, fever.  He  noted that his temperature was up to 100.3 and that he was spitting up  blood-tinged brown mucus.  He denied any shortness of breath, dyspnea on  exertion, chest pain, wheezing, or exertional dyspnea.  He did complain  of nasal congestion and a sore throat.   PAST MEDICAL HISTORY:  1. Coronary artery disease.  2. Type 2 diabetes.  3. Hyperlipidemia.  4. Hypertension.  5. Back pain.  6. Sleep apnea.   MEDICATIONS:  1. Trazodone 150 mg daily.  2. Imdur 30 mg three tablets daily.  3. Metoprolol 25 mg twice a day.  4. Lipitor 10 mg daily.  5. Zantac 300 mg two tablets twice a day.  6. Amaryl 2 mg a half a tablet daily.  7. Aspirin 325 mg daily.  8. Darvocet 100/500 mg one tablet q.6h p.r.n.   ALLERGIES:  1. KEFLEX.  2. CODEINE.   PAST SURGICAL HISTORY:  Cervical laminectomy.   SOCIAL HISTORY:  The patient is married, a nonsmoker, and does not drink  alcohol.   REVIEW OF SYSTEMS:  As per HPI.  Additionally, CARDIOVASCULAR:  The  patient denied any chest pain, shortness of breath, weight gain, or  swelling.  GI:  Unremarkable.   PHYSICAL EXAMINATION:  VITAL SIGNS:  Weight of 214, temperature 98.3,  pulse 68, respiratory rate of 18, blood pressure 116/63, pulse-ox at  room air was 94%.  GENERAL:  We have a pleasant, overweight male, who is coughing heavily  in the exam room but in no acute distress.  HEENT:  Normocephalic, atraumatic, tympanic membranes were both clear  bilaterally, the nasal mucosa was boggy with  greenish nasal discharge,  oropharynx was moist with yellowish to green postnasal drip.  NECK:  Supple with tender anterior cervical lymphadenopathy.  LUNGS:  Significant for bronchial breath sounds that clear with  coughing, there were no crackles or wheezing noted on my exam.  HEART:  Distant but regular rate and rhythm with no murmurs, gallops, or  rubs heard on my exam today.  SKIN:  No obvious lesions.   IMAGING:  Chest x-ray showed a left upper lobe pneumonia.   IMPRESSION:  A 77 year old male with a left upper lobe pneumonia.   PLAN:  1. Will admit patient to telemetry for inpatient treatment of his      pneumonia.  He will be started on Avelox 400 mg daily IV.  2. The rest of his chronic medical problems are stable, and we will      restart his outpatient medications.  3. Please see orders for further details.  4. Floor to call  [facility], i.e., Dr. Arnoldo Morale when patient      arrives on the floor for further recommendations.     Roy [name], M.D.  Electronically Signed    LA/MedQ  DD: 03/01/2007  DT: 03/01/2007  Job #: [id number]

## 2010-07-21 NOTE — H&P (Signed)
NAME:  Roy [name], Roy [name]                 ACCOUNT NO.:  [id number]   MEDICAL RECORD NO.:  [id number]          PATIENT TYPE:  INP   LOCATION:  1826                         FACILITY:  [facility]   PHYSICIAN:  Jacqulyn Ducking, M.D.  DATE OF BIRTH:  1932-07-17   DATE OF ADMISSION:  08/31/2004  DATE OF DISCHARGE:                                HISTORY & PHYSICAL   PHYSICIANS:  1.  Dr. Tomi Bamberger, referring.  2.  Dr. Arnoldo Morale, primary care physician.  3.  Dr. Lia Foyer, primary cardiologist.   HISTORY OF PRESENT ILLNESS:  A 77 year old gentleman with known coronary  disease presenting with left shoulder and left subscapular discomfort. Roy [name]'s history of coronary disease dates to 1999 when he was found to have  a critical diagonal stenosis and underwent high-speed rotational  atherectomy. He suffered a recurrence later that year, prompting  reintervention. He returned in November 2000 with symptoms prompting repeat  catheterization which demonstrated a 50% lesion at the site of his prior  interventions. He also had a 30% circumflex stenosis, but no other  significant focal disease. He has done well since then with essentially no  symptoms. He has been seen every few years by Dr. Lia Foyer and found to be  doing well. He is generally relatively inactive, but is able to do yard work  without any significant symptoms. During the past week, he has noted  intermittent aching in the right lateral chest. He attributes this to chest  wall injury, but intends seek medical assessment by his primary care  physician. This afternoon, he noted sharp left shoulder and left subscapular  pain that was somewhat poorly localized and of moderate severity. There was  no effect in moving his arm, torso, change in body position or walking. He  had nitroglycerin but did not use it. The discomfort had a pleuritic  component. There was no true radiation. There was no associated nausea nor  diaphoresis. He called his primary care  physician who suggested a trial of  antacids and subsequent emergency department evaluation if symptoms  persisted. On arrival here, he had significant residual discomfort. This  improved with intravenous nitroglycerin. At the present time, he feels  fairly well.   Most recent ischemic evaluation was in 2001 of which time a stress  Cardiolite study was normal. Left ventricular systolic function was normal.   Roy [name] has multiple contributors to vascular disease including diabetes  that is well controlled with an oral agent, hyperlipidemia and hypertension.   PAST MEDICAL HISTORY:  Notable for sleep apnea and GERD.   ALLERGIES:  The patient reports no true allergies, but has had adverse  reactions to CODEINE.   MEDICATIONS:  1.  Amaryl 2 milligrams q.d.  2.  Ranitidine 300 milligrams b.i.d.  3.  Atorvastatin 10 milligrams q.d.  4.  Trazodone 150 milligrams q.d.  5.  Metoprolol 25 milligrams b.i.d.  6.  Astelin 5 milligrams q.a.m.  7.  Enteric-coated aspirin 325 milligrams q.d.  8.  He also takes zinc, fish oil and calcium.   SOCIAL HISTORY:  Retired; lives in [place] with  his wife. Modest remote  history of cigarette smoking. Denies any use of alcohol.   FAMILY HISTORY:  Brother has had a CABG surgery. Both a brother and sister  have diabetes.   REVIEW OF SYSTEMS:  Generally negative. He has had some psoriasis. He  reports nocturia once per night. All other systems reviewed and are  negative.   PHYSICAL EXAMINATION:  GENERAL:  On exam pleasant, well-appearing gentleman  in no acute distress.  VITAL SIGNS:  The temperature is 97.2, heart rate 55 and regular,  respirations 16, blood pressure 130/75.  HEENT: Anicteric sclerae; normal lids and conjunctiva; pupils equal, round,  react to light; EOMs full.  NECK: No jugular venous distension; normal carotid upstrokes without bruits;  supple.  ENDOCRINE: No thyromegaly.  HEMATOPOIETIC: No cervical, axillary or inguinal  adenopathy.  SKIN: Few seborrheic keratoses and nevi over the trunk; mild actinic damage.  LUNGS: Clear.  CARDIAC: Normal first and second heart sounds; prominent fourth heart  sounds; normal PMI.  ABDOMEN: Soft and nontender; aortic pulsation not palpable; no bruits; no  organomegaly; normal bowel sounds.  EXTREMITIES: Normal distal pulses; no edema.  NEUROMUSCULAR: Symmetric strength and tone; normal cranial nerves.  MUSCULOSKELETAL: No joint deformities.  PSYCHIATRIC: Normal orientation; normal affect.   LABORATORY DATA:  EKG: Sinus bradycardia; right ventricular conduction  delay; minor nonspecific ST abnormality. Other laboratory including a  chemistry profile, CBC, coagulation studies and point of care markers are  negative. D-dimer is also normal. Chest x-ray showed no significant disease.   IMPRESSION:  Roy [name] has had significant coronary disease in the past but  has been asymptomatic for years. His current symptoms are somewhat atypical.  His exam and EKG are benign. Initial markers are normal. I suggested that an  overnight observation for further diagnostic evaluation. If he remains  asymptomatic, markers remain negative and his repeat EKG is unremarkable, a  stress test tomorrow afternoon would be a reasonable approach to excluding  significant coronary disease.       RR/MEDQ  D:  09/01/2004  T:  09/01/2004  Job:  [id number]

## 2010-07-25 ENCOUNTER — Other Ambulatory Visit: Payer: Self-pay | Admitting: Internal Medicine

## 2010-08-14 ENCOUNTER — Other Ambulatory Visit: Payer: Self-pay | Admitting: Internal Medicine

## 2010-10-03 ENCOUNTER — Other Ambulatory Visit: Payer: Self-pay | Admitting: Internal Medicine

## 2010-10-30 ENCOUNTER — Encounter: Payer: Self-pay | Admitting: Internal Medicine

## 2010-10-30 ENCOUNTER — Ambulatory Visit (INDEPENDENT_AMBULATORY_CARE_PROVIDER_SITE_OTHER): Payer: PRIVATE HEALTH INSURANCE | Admitting: Internal Medicine

## 2010-10-30 VITALS — BP 134/82 | HR 68 | Temp 98.2°F | Resp 16 | Ht 69.0 in | Wt 196.0 lb

## 2010-10-30 DIAGNOSIS — J4530 Mild persistent asthma, uncomplicated: Secondary | ICD-10-CM | POA: Insufficient documentation

## 2010-10-30 DIAGNOSIS — J449 Chronic obstructive pulmonary disease, unspecified: Secondary | ICD-10-CM

## 2010-10-30 DIAGNOSIS — J441 Chronic obstructive pulmonary disease with (acute) exacerbation: Secondary | ICD-10-CM

## 2010-10-30 DIAGNOSIS — E119 Type 2 diabetes mellitus without complications: Secondary | ICD-10-CM

## 2010-10-30 DIAGNOSIS — I2510 Atherosclerotic heart disease of native coronary artery without angina pectoris: Secondary | ICD-10-CM

## 2010-10-30 DIAGNOSIS — R110 Nausea: Secondary | ICD-10-CM

## 2010-10-30 DIAGNOSIS — N182 Chronic kidney disease, stage 2 (mild): Secondary | ICD-10-CM

## 2010-10-30 LAB — CBC WITH DIFFERENTIAL/PLATELET
Basophils Relative: 0.6 % (ref 0.0–3.0)
Hemoglobin: 13.8 g/dL (ref 13.0–17.0)
Lymphocytes Relative: 18.1 % (ref 12.0–46.0)
MCHC: 33.1 g/dL (ref 30.0–36.0)
Monocytes Relative: 8.6 % (ref 3.0–12.0)
Neutro Abs: 4.3 10*3/uL (ref 1.4–7.7)
RBC: 4.78 Mil/uL (ref 4.22–5.81)

## 2010-10-30 LAB — BASIC METABOLIC PANEL
BUN: 32 mg/dL — ABNORMAL HIGH (ref 6–23)
CO2: 27 mEq/L (ref 19–32)
Chloride: 104 mEq/L (ref 96–112)
Creatinine, Ser: 1.8 mg/dL — ABNORMAL HIGH (ref 0.4–1.5)

## 2010-10-30 NOTE — Progress Notes (Signed)
  Subjective:    Patient ID: Roy [name], male    DOB: February 13, 1933, 77 y.o.   MRN: [id number]  HPI Patient has been doing well until last Friday when he states that he had a yogurt in the morning and went outside shortly thereafter he developed extreme nausea vomited twice was given an antiemetic by his wife and he states that the symptoms abated today he has residual weakness but denies any fever chills any current diarrhea abdominal pain or cough.    Review of Systems  Constitutional: Positive for fever.  HENT: Negative for hearing loss, congestion, neck pain and postnasal drip.   Eyes: Negative for discharge, redness and visual disturbance.  Respiratory: Negative for cough, shortness of breath and wheezing.   Cardiovascular: Negative for leg swelling.  Gastrointestinal: Positive for nausea and vomiting. Negative for abdominal pain, constipation and abdominal distention.  Genitourinary: Negative for urgency and frequency.  Musculoskeletal: Negative for joint swelling and arthralgias.  Skin: Negative for color change and rash.  Neurological: Positive for weakness. Negative for light-headedness.  Hematological: Negative for adenopathy.  Psychiatric/Behavioral: Negative for behavioral problems.   Past Medical History  Diagnosis Date  . CAD (coronary artery disease)   . Diabetes mellitus, type 2   . Hyperlipidemia   . Hypertension   . COPD (chronic obstructive pulmonary disease)     Stage 2   Past Surgical History  Procedure Date  . Cervical laminectomy     reports that he quit smoking about 52 years ago. He does not have any smokeless tobacco history on file. He reports that he does not drink alcohol or use illicit drugs. family history includes ALS in his father. Allergies  Allergen Reactions  . Cephalexin     REACTION: rash  . Codeine Sulfate     REACTION: chest pain       Objective:   Physical Exam  Nursing note and vitals reviewed. Constitutional: He is oriented to  person, place, and time. He appears well-developed and well-nourished.  HENT:  Head: Normocephalic and atraumatic.  Eyes: Conjunctivae are normal. Pupils are equal, round, and reactive to light.  Neck: Normal range of motion. Neck supple.  Cardiovascular: Normal rate and regular rhythm.   Pulmonary/Chest: Effort normal and breath sounds normal.  Abdominal: Soft. Bowel sounds are normal.  Musculoskeletal: Normal range of motion.  Neurological: He is alert and oriented to person, place, and time.  Skin: Skin is warm and dry.          Assessment & Plan:  The differential diagnosis for his episode of nausea and vomiting is hypoglycemia as well as an acute viral gastroenteritis such as an oral virus.  There have been reported cases of viral gastroenteritis in the community and this is the more likely diagnosis but hypoglycemia is also possible. He is persistently weak and we will monitor a CBC with differential as well as a basic metabolic panel. Vital signs are stable he is afebrile I am hopeful that his weakness represents just residual impact of a viral infection.  We also will monitor a basic metabolic panel to look at his chronic renal insufficiency.   He has been compliant with his sleep apnea mask and device without complaint.

## 2010-11-03 ENCOUNTER — Other Ambulatory Visit: Payer: Self-pay | Admitting: Internal Medicine

## 2010-11-24 LAB — COMPREHENSIVE METABOLIC PANEL
Albumin: 4.4
BUN: 31 — ABNORMAL HIGH
Creatinine, Ser: 1.71 — ABNORMAL HIGH
Total Bilirubin: 1.2
Total Protein: 7.4

## 2010-11-24 LAB — DIFFERENTIAL
Basophils Absolute: 0
Lymphocytes Relative: 5 — ABNORMAL LOW
Monocytes Absolute: 0.4
Monocytes Relative: 5
Neutro Abs: 7.7

## 2010-11-24 LAB — URINALYSIS, ROUTINE W REFLEX MICROSCOPIC
Hgb urine dipstick: NEGATIVE
Specific Gravity, Urine: 1.026
Urobilinogen, UA: 0.2

## 2010-11-24 LAB — CBC
HCT: 43.3
MCV: 84.1
Platelets: 128 — ABNORMAL LOW
RDW: 15.5

## 2010-11-26 ENCOUNTER — Other Ambulatory Visit: Payer: Self-pay | Admitting: Internal Medicine

## 2010-12-04 ENCOUNTER — Ambulatory Visit (INDEPENDENT_AMBULATORY_CARE_PROVIDER_SITE_OTHER)
Admission: RE | Admit: 2010-12-04 | Discharge: 2010-12-04 | Disposition: A | Discharge status: Home / Self Care | Payer: PRIVATE HEALTH INSURANCE | Source: Ambulatory Visit | Attending: Family Medicine | Admitting: Family Medicine

## 2010-12-04 ENCOUNTER — Encounter: Payer: Self-pay | Admitting: Family Medicine

## 2010-12-04 ENCOUNTER — Ambulatory Visit (INDEPENDENT_AMBULATORY_CARE_PROVIDER_SITE_OTHER): Payer: PRIVATE HEALTH INSURANCE | Admitting: Family Medicine

## 2010-12-04 VITALS — BP 130/80 | Temp 97.6°F | Wt 200.0 lb

## 2010-12-04 DIAGNOSIS — M79605 Pain in left leg: Secondary | ICD-10-CM

## 2010-12-04 DIAGNOSIS — S8000XA Contusion of unspecified knee, initial encounter: Secondary | ICD-10-CM

## 2010-12-04 DIAGNOSIS — M79609 Pain in unspecified limb: Secondary | ICD-10-CM

## 2010-12-04 DIAGNOSIS — Z23 Encounter for immunization: Secondary | ICD-10-CM

## 2010-12-04 NOTE — Progress Notes (Signed)
  Subjective:    Patient ID: Roy [name], male    DOB: 1932-06-01, 77 y.o.   MRN: [id number]  HPI Patient seen with left knee and leg injury. Occurred about 10 days ago. He was mowing his yard and accidentally ran into his deck with his riding mower. Left knee directly into the back. He continued to [facility] but had some difficulty walking later that day. Immediate swelling of the knee and leg. Had extensive bruising of the leg. He has hematoma surrounding the left patella and also pain proximal left tibia. No other injuries reported. He does take aspirin but no other anticoagulants   Review of Systems  Constitutional: Negative for fever and chills.  Musculoskeletal: Negative for gait problem.       Objective:   Physical Exam  Constitutional: He appears well-developed and well-nourished. No distress.  Cardiovascular: Normal rate and regular rhythm.   Pulmonary/Chest: Effort normal and breath sounds normal. No respiratory distress. He has no wheezes. He has no rales.  Musculoskeletal:       Patient has extensive ecchymosis involving the left knee and leg all the way to the foot. He has some diffuse edema left leg. Left knee reveals full range of motion. Hematoma involving the inferior medial portion of the patella. Patient also has significant tenderness to palpation along the proximal tibia. No erythema or significant warmth.  Full range of motion left knee. No calf tenderness          Assessment & Plan:  Traumatic left knee and leg injury. Patient has hematoma left knee. X-rays left knee and tibia to rule out fracture. If negative focus on elevation and he is aware of the hematoma will take several months to fully resolve

## 2010-12-05 NOTE — Progress Notes (Signed)
Quick Note:  Pt wife informed ______

## 2010-12-08 LAB — CULTURE, BLOOD (ROUTINE X 2)

## 2010-12-08 LAB — COMPREHENSIVE METABOLIC PANEL
Alkaline Phosphatase: 61
BUN: 25 — ABNORMAL HIGH
CO2: 27
Chloride: 102
Creatinine, Ser: 1.6 — ABNORMAL HIGH
GFR calc non Af Amer: 43 — ABNORMAL LOW
Glucose, Bld: 189 — ABNORMAL HIGH
Potassium: 3.5
Total Bilirubin: 0.9

## 2010-12-08 LAB — CBC
HCT: 37 — ABNORMAL LOW
Hemoglobin: 12.7 — ABNORMAL LOW
MCV: 83.9
RBC: 4.41
WBC: 11.2 — ABNORMAL HIGH

## 2010-12-19 ENCOUNTER — Other Ambulatory Visit: Payer: Self-pay | Admitting: Internal Medicine

## 2011-01-29 ENCOUNTER — Ambulatory Visit (INDEPENDENT_AMBULATORY_CARE_PROVIDER_SITE_OTHER): Payer: PRIVATE HEALTH INSURANCE | Admitting: Internal Medicine

## 2011-01-29 ENCOUNTER — Encounter: Payer: Self-pay | Admitting: Internal Medicine

## 2011-01-29 DIAGNOSIS — J189 Pneumonia, unspecified organism: Secondary | ICD-10-CM

## 2011-01-29 DIAGNOSIS — J449 Chronic obstructive pulmonary disease, unspecified: Secondary | ICD-10-CM

## 2011-01-29 DIAGNOSIS — J441 Chronic obstructive pulmonary disease with (acute) exacerbation: Secondary | ICD-10-CM

## 2011-01-29 MED ORDER — MOXIFLOXACIN HCL 400 MG PO TABS: 400.0000 mg | ORAL_TABLET | Freq: Every day | ORAL | Status: AC

## 2011-01-29 NOTE — Progress Notes (Signed)
Subjective:    Patient ID: Roy [name], male    DOB: Sep 03, 1932, 77 y.o.   MRN: [id number]  HPI It is a 77 year old male with a history of diabetes hyperlipidemia Parkinson's and COPD who presents with a chronic cough that is intermittently productive of sputum that has recently changed in color.     Review of Systems  Constitutional: Negative for fever and fatigue.  HENT: Positive for congestion. Negative for hearing loss, neck pain and postnasal drip.   Eyes: Negative for discharge, redness and visual disturbance.  Respiratory: Positive for cough and shortness of breath. Negative for wheezing.   Cardiovascular: Negative for leg swelling.  Gastrointestinal: Negative for abdominal pain, constipation and abdominal distention.  Genitourinary: Negative for urgency and frequency.  Musculoskeletal: Negative for joint swelling and arthralgias.  Skin: Negative for color change and rash.  Neurological: Negative for weakness and light-headedness.  Hematological: Negative for adenopathy.  Psychiatric/Behavioral: Negative for behavioral problems.   . Past Medical History  Diagnosis Date  . CAD (coronary artery disease)   . Diabetes mellitus, type 2   . Hyperlipidemia   . Hypertension   . COPD (chronic obstructive pulmonary disease)     Stage 2    History   Social History  . Marital Status: Married    Spouse Name: N/A    Number of Children: N/A  . Years of Education: N/A   Occupational History  . Not on file.   Social History Main Topics  . Smoking status: Former Smoker    Quit date: 03/05/1958  . Smokeless tobacco: Not on file  . Alcohol Use: No  . Drug Use: No  . Sexually Active: Not Currently   Other Topics Concern  . Not on file   Social History Narrative  . No narrative on file    Past Surgical History  Procedure Date  . Cervical laminectomy     Family History  Problem Relation Age of Onset  . ALS Father     Allergies  Allergen Reactions  .  Cephalexin     REACTION: rash  . Codeine Sulfate     REACTION: chest pain    Current Outpatient Prescriptions on File Prior to Visit  Medication Sig Dispense Refill  . acetaminophen (TYLENOL) 325 MG tablet Take 650 mg by mouth every 6 (six) hours as needed. Taken every 6 hours as needed with Tramadol.       [name] alfuzosin (UROXATRAL) 10 MG 24 hr tablet Take 10 mg by mouth daily.        [name] aspirin 325 MG EC tablet Take 325 mg by mouth daily.        [name] atorvastatin (LIPITOR) 10 MG tablet Take 1 tablet (10 mg total) by mouth daily.  30 tablet  11  . betamethasone dipropionate (DIPROLENE) 0.05 % ointment APPLY EVERY DAY AS DIRECTED  30 g  3  . Fluticasone-Salmeterol (ADVAIR DISKUS) 100-50 MCG/DOSE AEPB Inhale 1 puff into the lungs.        [name] glimepiride (AMARYL) 2 MG tablet TAKE ONE TABLET BY MOUTH EVERY DAY  30 tablet  11  . isosorbide mononitrate (IMDUR) 30 MG 24 hr tablet TAKE THREE TABLETS BY MOUTH EVERY DAY  90 tablet  3  . metFORMIN (GLUCOPHAGE-XR) 500 MG 24 hr tablet TAKE ONE TABLET BY MOUTH EVERY DAY  30 tablet  11  . metoprolol (LOPRESSOR) 50 MG tablet Take 0.5 tablets (25 mg total) by mouth 2 (two) times daily.  90 tablet  3  .  nitroGLYCERIN (NITROSTAT) 0.4 MG SL tablet Place 0.4 mg under the tongue every 5 (five) minutes as needed.        . potassium chloride SA (KLOR-CON M15) 15 MEQ tablet Take 15 mEq by mouth 2 (two) times daily.        . ranitidine (ZANTAC) 300 MG tablet Take 1 tablet (300 mg total) by mouth 2 (two) times daily.  90 tablet  3  . traMADol (ULTRAM) 50 MG tablet TAKE 1 TABLET WITH TYLENOL 325MG  EVERY 6 HOURS AS NEEDED FOR PAIN (THIS IS SUBSTITUTE FOR DARVOCET-N)  30 tablet  3  . traZODone (DESYREL) 150 MG tablet TAKE ONE TABLET BY MOUTH EVERY DAY  30 tablet  6    BP 130/80  Pulse 72  Temp 97.9 F (36.6 C)  Resp 16  Ht 5' 9.5" (1.765 m)  Wt 198 lb (89.812 kg)  BMI 28.82 kg/m2       Objective:   Physical Exam  Constitutional: He appears well-developed and  well-nourished.  HENT:  Head: Normocephalic and atraumatic.  Eyes: Conjunctivae are normal. Pupils are equal, round, and reactive to light.  Neck: Normal range of motion. Neck supple.  Cardiovascular: Normal rate and regular rhythm.   Pulmonary/Chest: Effort normal.       Right lower lobe egophony and crackles  Abdominal: Soft. Bowel sounds are normal.          Assessment & Plan:  Given his history of COPD his symptoms or worries and some for early pneumonia The location of the infection would warrant Avelox 400 mg for 14 days And would also recommend Mucinex

## 2011-01-29 NOTE — Patient Instructions (Signed)
Complete the full 14 days of antibiotic and also use Mucinex fast Max 4 times a day for the congestion and cough

## 2011-01-31 ENCOUNTER — Other Ambulatory Visit: Payer: Self-pay | Admitting: Internal Medicine

## 2011-02-07 ENCOUNTER — Other Ambulatory Visit: Payer: Self-pay | Admitting: Internal Medicine

## 2011-02-20 ENCOUNTER — Other Ambulatory Visit: Payer: Self-pay | Admitting: Internal Medicine

## 2011-03-06 HISTORY — PX: CARDIAC CATHETERIZATION: SHX172

## 2011-03-12 ENCOUNTER — Other Ambulatory Visit: Payer: Self-pay | Admitting: Internal Medicine

## 2011-03-16 ENCOUNTER — Other Ambulatory Visit: Payer: Self-pay | Admitting: Internal Medicine

## 2011-03-19 ENCOUNTER — Other Ambulatory Visit: Payer: Self-pay | Admitting: Internal Medicine

## 2011-04-23 ENCOUNTER — Other Ambulatory Visit: Payer: Self-pay | Admitting: Internal Medicine

## 2011-04-30 ENCOUNTER — Encounter: Payer: Self-pay | Admitting: Internal Medicine

## 2011-04-30 ENCOUNTER — Ambulatory Visit (INDEPENDENT_AMBULATORY_CARE_PROVIDER_SITE_OTHER): Payer: PRIVATE HEALTH INSURANCE | Admitting: Internal Medicine

## 2011-04-30 DIAGNOSIS — S30861A Insect bite (nonvenomous) of abdominal wall, initial encounter: Secondary | ICD-10-CM

## 2011-04-30 DIAGNOSIS — E785 Hyperlipidemia, unspecified: Secondary | ICD-10-CM

## 2011-04-30 DIAGNOSIS — I10 Essential (primary) hypertension: Secondary | ICD-10-CM

## 2011-04-30 LAB — LIPID PANEL: Total CHOL/HDL Ratio: 3

## 2011-04-30 LAB — HEMOGLOBIN A1C: Hgb A1c MFr Bld: 6.8 % — ABNORMAL HIGH (ref 4.6–6.5)

## 2011-04-30 MED ORDER — ATORVASTATIN CALCIUM 10 MG PO TABS: 10.0000 mg | ORAL_TABLET | Freq: Every day | ORAL | Status: DC

## 2011-04-30 MED ORDER — DOXYCYCLINE HYCLATE 100 MG PO TABS: 100.0000 mg | ORAL_TABLET | Freq: Two times a day (BID) | ORAL | Status: AC

## 2011-04-30 NOTE — Patient Instructions (Signed)
The patient is instructed to continue all medications as prescribed. Schedule followup with check out clerk upon leaving the clinic  

## 2011-04-30 NOTE — Progress Notes (Signed)
  Subjective:    Patient ID: Roy [name], male    DOB: 03/06/1932, 78 y.o.   MRN: [id number]  HPI    Review of Systems     Objective:   Physical Exam  Diabetic foot exam:  Left: Reflexes 3+   Vibratory sensation normal  Proprioception normal  Sharp/dull discrimination normal  Filament test present Right: Reflexes 3+   Vibratory sensation diminished  Proprioception normal  Sharp/dull discrimination normal  Filament test present       Assessment & Plan:

## 2011-04-30 NOTE — Progress Notes (Signed)
  Subjective:    Patient ID: Roy [name], male    DOB: September 30, 1932, 78 y.o.   MRN: [id number]  HPI The patient had a tick bite on his abdomen wall. There is an area of erythema at the site of the tick bite. The tick bite was Saturday prior to his visit on Monday. Otherwise he is doing well his blood pressures been well controlled his breathing has been stable and her blood sugars have been good He reports no problems with his feet and he has an eye examination scheduled although he is going to have to change his ophthalmologist due to an insurance change   Review of Systems  Constitutional: Negative for fever and fatigue.  HENT: Negative for hearing loss, congestion, neck pain and postnasal drip.   Eyes: Negative for discharge, redness and visual disturbance.  Respiratory: Negative for cough, shortness of breath and wheezing.   Cardiovascular: Negative for leg swelling.  Gastrointestinal: Negative for abdominal pain, constipation and abdominal distention.  Genitourinary: Negative for urgency and frequency.  Musculoskeletal: Negative for joint swelling and arthralgias.  Skin: Negative for color change and rash.  Neurological: Negative for weakness and light-headedness.  Hematological: Negative for adenopathy.  Psychiatric/Behavioral: Negative for behavioral problems.       Objective:   Physical Exam  Nursing note and vitals reviewed. Constitutional: He appears well-developed and well-nourished.  HENT:  Head: Normocephalic and atraumatic.  Eyes: Conjunctivae are normal. Pupils are equal, round, and reactive to light.  Neck: Normal range of motion. Neck supple.  Cardiovascular: Normal rate and regular rhythm.   Pulmonary/Chest: Effort normal and breath sounds normal.  Abdominal: Soft. Bowel sounds are normal.  Skin:       There is a dime size area of erythema on the abdomen wall          Assessment & Plan:  The patient has an apparent tick bite he removed the tick it was  embedded he does have erythema at the site was localized cellulitis for safety sake we would treat him with doxycycline 100 mg twice a day for the next 14 days if rash develops we will extend that to 21 days  Blood sugar appears to be stable hemoglobin A1c will be monitored today.  Blood pressure is stable.  CAD is stable.  Refill the lipitor at [facility]  COPD is stable.

## 2011-04-30 NOTE — Progress Notes (Signed)
Addended by: Joyce Gross R on: 04/30/2011 11:11 AM   Modules accepted: Orders

## 2011-05-29 ENCOUNTER — Ambulatory Visit: Payer: PRIVATE HEALTH INSURANCE | Admitting: Family Medicine

## 2011-06-10 ENCOUNTER — Other Ambulatory Visit: Payer: Self-pay | Admitting: Internal Medicine

## 2011-07-15 ENCOUNTER — Other Ambulatory Visit: Payer: Self-pay | Admitting: Internal Medicine

## 2011-07-24 ENCOUNTER — Other Ambulatory Visit (INDEPENDENT_AMBULATORY_CARE_PROVIDER_SITE_OTHER): Payer: PRIVATE HEALTH INSURANCE

## 2011-07-24 DIAGNOSIS — E119 Type 2 diabetes mellitus without complications: Secondary | ICD-10-CM

## 2011-07-24 DIAGNOSIS — E785 Hyperlipidemia, unspecified: Secondary | ICD-10-CM

## 2011-07-24 LAB — LIPID PANEL
Cholesterol: 103 mg/dL (ref 0–200)
HDL: 32.5 mg/dL — ABNORMAL LOW (ref >39.00)
Triglycerides: 169 mg/dL — ABNORMAL HIGH (ref 0.0–149.0)
VLDL: 33.8 mg/dL (ref 0.0–40.0)

## 2011-07-31 ENCOUNTER — Encounter: Payer: Self-pay | Admitting: Internal Medicine

## 2011-07-31 ENCOUNTER — Ambulatory Visit (INDEPENDENT_AMBULATORY_CARE_PROVIDER_SITE_OTHER): Payer: PRIVATE HEALTH INSURANCE | Admitting: Internal Medicine

## 2011-07-31 VITALS — BP 140/76 | HR 64 | Temp 98.3°F | Resp 16 | Ht 68.0 in | Wt 200.0 lb

## 2011-07-31 DIAGNOSIS — IMO0002 Reserved for concepts with insufficient information to code with codable children: Secondary | ICD-10-CM

## 2011-07-31 DIAGNOSIS — E1165 Type 2 diabetes mellitus with hyperglycemia: Secondary | ICD-10-CM

## 2011-07-31 DIAGNOSIS — E1169 Type 2 diabetes mellitus with other specified complication: Secondary | ICD-10-CM

## 2011-07-31 DIAGNOSIS — E785 Hyperlipidemia, unspecified: Secondary | ICD-10-CM

## 2011-07-31 DIAGNOSIS — I2510 Atherosclerotic heart disease of native coronary artery without angina pectoris: Secondary | ICD-10-CM

## 2011-07-31 DIAGNOSIS — I10 Essential (primary) hypertension: Secondary | ICD-10-CM

## 2011-07-31 NOTE — Patient Instructions (Signed)
A 5 pound weight loss is recommended

## 2011-07-31 NOTE — Progress Notes (Signed)
Subjective:    Patient ID: Roy [name], male    DOB: 1932-05-01, 78 y.o.   MRN: [id number]  HPI Patient is a 78 year old male who presents for followup of hypertension hyperlipidemia and of adult onset diabetes.  He has noticed improved his CBGs lately his A1c checked one week ago was 6.8 which is stable but the triglycerides were 169 which indicates higher blood sugars and we would like an optimal level.  His blood pressure is stable he has no chest pain. He is also followed for mild Parkinson's.   Review of Systems  Constitutional: Negative for fever and fatigue.  HENT: Negative for hearing loss, congestion, neck pain and postnasal drip.   Eyes: Negative for discharge, redness and visual disturbance.  Respiratory: Negative for cough, shortness of breath and wheezing.   Cardiovascular: Negative for leg swelling.  Gastrointestinal: Negative for abdominal pain, constipation and abdominal distention.  Genitourinary: Negative for urgency and frequency.  Musculoskeletal: Negative for joint swelling and arthralgias.  Skin: Negative for color change and rash.  Neurological: Negative for weakness and light-headedness.  Hematological: Negative for adenopathy.  Psychiatric/Behavioral: Negative for behavioral problems.   Past Medical History  Diagnosis Date  . CAD (coronary artery disease)   . Diabetes mellitus, type 2   . Hyperlipidemia   . Hypertension   . COPD (chronic obstructive pulmonary disease)     Stage 2    History   Social History  . Marital Status: Married    Spouse Name: N/A    Number of Children: N/A  . Years of Education: N/A   Occupational History  . Not on file.   Social History Main Topics  . Smoking status: Former Smoker    Quit date: 03/05/1958  . Smokeless tobacco: Not on file  . Alcohol Use: No  . Drug Use: No  . Sexually Active: Not Currently   Other Topics Concern  . Not on file   Social History Narrative  . No narrative on file    Past  Surgical History  Procedure Date  . Cervical laminectomy     Family History  Problem Relation Age of Onset  . ALS Father     Allergies  Allergen Reactions  . Cephalexin     REACTION: rash  . Codeine Sulfate     REACTION: chest pain    Current Outpatient Prescriptions on File Prior to Visit  Medication Sig Dispense Refill  . acetaminophen (TYLENOL) 325 MG tablet Take 650 mg by mouth every 6 (six) hours as needed. Taken every 6 hours as needed with Tramadol.       [name] ADVAIR DISKUS 100-50 MCG/DOSE AEPB INHALE ONE PUFF BY MOUTH EVERY DAY  60 each  8  . alfuzosin (UROXATRAL) 10 MG 24 hr tablet Take 10 mg by mouth daily.        [name] aspirin 325 MG EC tablet Take 325 mg by mouth daily.        [name] atorvastatin (LIPITOR) 10 MG tablet Take 1 tablet (10 mg total) by mouth daily.  30 tablet  11  . betamethasone dipropionate (DIPROLENE) 0.05 % ointment APPLY EVERY DAY AS DIRECTED  30 g  3  . glimepiride (AMARYL) 2 MG tablet TAKE ONE TABLET BY MOUTH EVERY DAY  30 tablet  11  . isosorbide mononitrate (IMDUR) 30 MG 24 hr tablet TAKE THREE TABLETS BY MOUTH EVERY DAY  90 tablet  3  . metFORMIN (GLUCOPHAGE-XR) 500 MG 24 hr tablet TAKE ONE TABLET BY MOUTH  EVERY DAY  30 tablet  11  . metoprolol (TOPROL-XL) 50 MG 24 hr tablet TAKE ONE-HALF TABLET BY MOUTH TWICE DAILY  30 tablet  11  . MINITRAN 0.4 MG/HR USE AS DIRECTED  30 each  11  . nitroGLYCERIN (NITROSTAT) 0.4 MG SL tablet Place 0.4 mg under the tongue every 5 (five) minutes as needed.        . potassium chloride SA (KLOR-CON M15) 15 MEQ tablet Take 15 mEq by mouth 2 (two) times daily.        . ranitidine (ZANTAC) 300 MG tablet TAKE ONE TABLET BY MOUTH TWICE DAILY  90 tablet  3  . traMADol (ULTRAM) 50 MG tablet TAKE 1 TABLET WITH TYLENOL 325MG  EVERY 6 HOURS AS NEEDED FOR PAIN (THIS IS SUBSTITUTE FOR DARVOCET-N)  30 tablet  6  . traZODone (DESYREL) 150 MG tablet TAKE ONE TABLET BY MOUTH EVERY DAY  30 tablet  6    BP 140/76  Pulse 64  Temp 98.3 F  (36.8 C)  Resp 16  Ht 5\' 8"  (1.727 m)  Wt 200 lb (90.719 kg)  BMI 30.41 kg/m2       Objective:   Physical Exam  Nursing note and vitals reviewed. Constitutional: He appears well-developed and well-nourished.  HENT:  Head: Normocephalic and atraumatic.  Eyes: Conjunctivae are normal. Pupils are equal, round, and reactive to light.  Neck: Normal range of motion. Neck supple.  Cardiovascular: Normal rate and regular rhythm.   Pulmonary/Chest: Effort normal and breath sounds normal.  Abdominal: Soft. Bowel sounds are normal.          Assessment & Plan:  Improved diabetic control should help with the elevated triglycerides otherwise his total cholesterol HDL and LDL are at acceptable levels for management of coronary disease.  His A1c was 6.8 a goal A1c for him would be less than 6-1/2 hopefully the current CBGs indicate tighter control at this time.  There is a correlation between weight and his A1c is his best A1c in recent history was 6.61 his weight was about 5 pounds less than and is now

## 2011-08-03 ENCOUNTER — Other Ambulatory Visit: Payer: Self-pay | Admitting: Internal Medicine

## 2011-10-06 ENCOUNTER — Other Ambulatory Visit: Payer: Self-pay | Admitting: Internal Medicine

## 2011-10-31 ENCOUNTER — Encounter: Payer: Self-pay | Admitting: Internal Medicine

## 2011-10-31 ENCOUNTER — Ambulatory Visit (INDEPENDENT_AMBULATORY_CARE_PROVIDER_SITE_OTHER): Payer: PRIVATE HEALTH INSURANCE | Admitting: Internal Medicine

## 2011-10-31 VITALS — BP 140/82 | HR 64 | Temp 97.7°F | Resp 18 | Ht 69.0 in | Wt 196.0 lb

## 2011-10-31 DIAGNOSIS — J4489 Other specified chronic obstructive pulmonary disease: Secondary | ICD-10-CM

## 2011-10-31 DIAGNOSIS — I10 Essential (primary) hypertension: Secondary | ICD-10-CM

## 2011-10-31 DIAGNOSIS — R0989 Other specified symptoms and signs involving the circulatory and respiratory systems: Secondary | ICD-10-CM

## 2011-10-31 DIAGNOSIS — J449 Chronic obstructive pulmonary disease, unspecified: Secondary | ICD-10-CM

## 2011-10-31 DIAGNOSIS — IMO0001 Reserved for inherently not codable concepts without codable children: Secondary | ICD-10-CM

## 2011-10-31 DIAGNOSIS — R0600 Dyspnea, unspecified: Secondary | ICD-10-CM

## 2011-10-31 DIAGNOSIS — R0609 Other forms of dyspnea: Secondary | ICD-10-CM

## 2011-10-31 LAB — CBC WITH DIFFERENTIAL/PLATELET
Eosinophils Relative: 7.8 % — ABNORMAL HIGH (ref 0.0–5.0)
HCT: 43.5 % (ref 39.0–52.0)
Hemoglobin: 14.2 g/dL (ref 13.0–17.0)
Lymphocytes Relative: 19.9 % (ref 12.0–46.0)
Lymphs Abs: 1.5 10*3/uL (ref 0.7–4.0)
Monocytes Relative: 8.4 % (ref 3.0–12.0)
Neutro Abs: 4.8 10*3/uL (ref 1.4–7.7)
Platelets: 115 10*3/uL — ABNORMAL LOW (ref 150.0–400.0)
WBC: 7.6 10*3/uL (ref 4.5–10.5)

## 2011-10-31 LAB — BRAIN NATRIURETIC PEPTIDE: Pro B Natriuretic peptide (BNP): 38 pg/mL (ref 0.0–100.0)

## 2011-10-31 MED ORDER — METOPROLOL SUCCINATE ER 50 MG PO TB24: ORAL_TABLET | ORAL | Status: DC

## 2011-10-31 MED ORDER — FUROSEMIDE 20 MG PO TABS: 20.0000 mg | ORAL_TABLET | Freq: Every day | ORAL | Status: DC

## 2011-10-31 NOTE — Progress Notes (Signed)
Subjective:    Patient ID: Roy [name], male    DOB: 1932/10/13, 78 y.o.   MRN: [id number]  HPI  Increased SOB with exertion.  No reported chest pains or pressure No rest pain, no PND He did not appear to be in respiratory distress but he says is fairly consistent when he exercises he has some increased shortness of breath and has to stop to catch his breath.  His blood pressure is stable he denies any irregular heart beat or rapid heartbeat  Review of Systems  Constitutional: Negative for fever and fatigue.  HENT: Negative for hearing loss, congestion, neck pain and postnasal drip.   Eyes: Negative for discharge, redness and visual disturbance.  Respiratory: Positive for chest tightness and shortness of breath. Negative for cough and wheezing.   Cardiovascular: Negative for leg swelling.  Gastrointestinal: Negative for abdominal pain, constipation and abdominal distention.  Genitourinary: Negative for urgency and frequency.  Musculoskeletal: Negative for joint swelling and arthralgias.  Skin: Negative for color change and rash.  Neurological: Negative for weakness and light-headedness.  Hematological: Negative for adenopathy.  Psychiatric/Behavioral: Negative for behavioral problems.   Past Medical History  Diagnosis Date  . CAD (coronary artery disease)   . Diabetes mellitus, type 2   . Hyperlipidemia   . Hypertension   . COPD (chronic obstructive pulmonary disease)     Stage 2    History   Social History  . Marital Status: Married    Spouse Name: N/A    Number of Children: N/A  . Years of Education: N/A   Occupational History  . Not on file.   Social History Main Topics  . Smoking status: Former Smoker    Quit date: 03/05/1958  . Smokeless tobacco: Not on file  . Alcohol Use: No  . Drug Use: No  . Sexually Active: Not Currently   Other Topics Concern  . Not on file   Social History Narrative  . No narrative on file    Past Surgical History    Procedure Date  . Cervical laminectomy     Family History  Problem Relation Age of Onset  . ALS Father     Allergies  Allergen Reactions  . Cephalexin     REACTION: rash  . Codeine Sulfate     REACTION: chest pain    Current Outpatient Prescriptions on File Prior to Visit  Medication Sig Dispense Refill  . acetaminophen (TYLENOL) 325 MG tablet Take 650 mg by mouth every 6 (six) hours as needed. Taken every 6 hours as needed with Tramadol.       [name] ADVAIR DISKUS 100-50 MCG/DOSE AEPB INHALE ONE PUFF BY MOUTH EVERY DAY  60 each  8  . alfuzosin (UROXATRAL) 10 MG 24 hr tablet Take 10 mg by mouth daily.        [name] aspirin 325 MG EC tablet Take 325 mg by mouth daily.        [name] atorvastatin (LIPITOR) 10 MG tablet Take 1 tablet (10 mg total) by mouth daily.  30 tablet  11  . betamethasone dipropionate (DIPROLENE) 0.05 % ointment APPLY EVERY DAY AS DIRECTED  30 g  3  . glimepiride (AMARYL) 2 MG tablet TAKE ONE TABLET BY MOUTH EVERY DAY  30 tablet  11  . isosorbide mononitrate (IMDUR) 30 MG 24 hr tablet TAKE THREE TABLETS BY MOUTH EVERY DAY  90 tablet  3  . metFORMIN (GLUCOPHAGE-XR) 500 MG 24 hr tablet TAKE ONE TABLET BY MOUTH EVERY  DAY  30 tablet  11  . MINITRAN 0.4 MG/HR USE AS DIRECTED  30 each  11  . nitroGLYCERIN (NITROSTAT) 0.4 MG SL tablet Place 0.4 mg under the tongue every 5 (five) minutes as needed.        . potassium chloride SA (KLOR-CON M15) 15 MEQ tablet Take 15 mEq by mouth 2 (two) times daily.        . ranitidine (ZANTAC) 300 MG tablet TAKE ONE TABLET BY MOUTH TWICE DAILY  180 tablet  3  . traMADol (ULTRAM) 50 MG tablet TAKE 1 TABLET WITH TYLENOL 325MG  EVERY 6 HOURS AS NEEDED FOR PAIN (THIS IS SUBSTITUTE FOR DARVOCET-N)  30 tablet  6  . traZODone (DESYREL) 150 MG tablet TAKE ONE TABLET BY MOUTH EVERY DAY  30 tablet  2  . DISCONTD: metoprolol (TOPROL-XL) 50 MG 24 hr tablet TAKE ONE-HALF TABLET BY MOUTH TWICE DAILY  30 tablet  11  . furosemide (LASIX) 20 MG tablet Take 1 tablet  (20 mg total) by mouth daily.  30 tablet  3    BP 140/82  Pulse 64  Temp 97.7 F (36.5 C)  Resp 18  Ht 5\' 9"  (1.753 m)  Wt 196 lb (88.905 kg)  BMI 28.94 kg/m2       Objective:   Physical Exam  Nursing note and vitals reviewed. Constitutional: He appears well-developed and well-nourished.  HENT:  Head: Normocephalic and atraumatic.  Eyes: Conjunctivae are normal. Pupils are equal, round, and reactive to light.  Neck: Normal range of motion. Neck supple.  Cardiovascular: Normal rate and regular rhythm.   Pulmonary/Chest: Effort normal and breath sounds normal.  Abdominal: Soft. Bowel sounds are normal.          Assessment & Plan:  Cannot appreciate any heart failure he has trace edema in his lower extremities and no rales in his chest his blood pressure is 144/88 my examination so we will add a small amount of diuretic and decreased the beta blocker his pulse is slow and bradycardia may be an etiology of some shortness of breath with exertion as well as the beta blocker affecting his COPD we'll add the diuretic to achieve afterload reduction and will monitor the effect of this change  In the interim we will increase his Advair to twice daily we'll obtain appropriate blood work today including a BNP , CBC differential

## 2011-10-31 NOTE — Patient Instructions (Signed)
Please take your Advair twice daily

## 2011-11-12 ENCOUNTER — Other Ambulatory Visit: Payer: Self-pay | Admitting: Internal Medicine

## 2011-12-04 ENCOUNTER — Ambulatory Visit (INDEPENDENT_AMBULATORY_CARE_PROVIDER_SITE_OTHER): Payer: PRIVATE HEALTH INSURANCE | Admitting: Family

## 2011-12-04 ENCOUNTER — Telehealth: Payer: Self-pay | Admitting: Internal Medicine

## 2011-12-04 ENCOUNTER — Encounter: Payer: Self-pay | Admitting: Family

## 2011-12-04 VITALS — BP 126/80 | HR 64 | Temp 97.7°F | Wt 201.0 lb

## 2011-12-04 DIAGNOSIS — R0600 Dyspnea, unspecified: Secondary | ICD-10-CM

## 2011-12-04 DIAGNOSIS — R5381 Other malaise: Secondary | ICD-10-CM

## 2011-12-04 DIAGNOSIS — R5383 Other fatigue: Secondary | ICD-10-CM

## 2011-12-04 DIAGNOSIS — R0789 Other chest pain: Secondary | ICD-10-CM

## 2011-12-04 DIAGNOSIS — R0989 Other specified symptoms and signs involving the circulatory and respiratory systems: Secondary | ICD-10-CM

## 2011-12-04 LAB — CBC WITH DIFFERENTIAL/PLATELET
Basophils Relative: 0.9 % (ref 0.0–3.0)
Eosinophils Absolute: 1.2 10*3/uL — ABNORMAL HIGH (ref 0.0–0.7)
Eosinophils Relative: 16.5 % — ABNORMAL HIGH (ref 0.0–5.0)
Hemoglobin: 12.9 g/dL — ABNORMAL LOW (ref 13.0–17.0)
Lymphocytes Relative: 20.1 % (ref 12.0–46.0)
MCHC: 33.3 g/dL (ref 30.0–36.0)
Neutro Abs: 3.8 10*3/uL (ref 1.4–7.7)
Neutrophils Relative %: 53.9 % (ref 43.0–77.0)
RBC: 4.44 Mil/uL (ref 4.22–5.81)
WBC: 7.1 10*3/uL (ref 4.5–10.5)

## 2011-12-04 LAB — BASIC METABOLIC PANEL
BUN: 40 mg/dL — ABNORMAL HIGH (ref 6–23)
CO2: 29 mEq/L (ref 19–32)
Chloride: 102 mEq/L (ref 96–112)
Creatinine, Ser: 2 mg/dL — ABNORMAL HIGH (ref 0.4–1.5)
Glucose, Bld: 125 mg/dL — ABNORMAL HIGH (ref 70–99)
Potassium: 4.4 mEq/L (ref 3.5–5.1)

## 2011-12-04 LAB — TSH: TSH: 2.34 u[IU]/mL (ref 0.35–5.50)

## 2011-12-04 NOTE — Telephone Encounter (Signed)
ller: [name]/Spouse; Patient Name: Roy [name]; PCP: Benay Pillow (Adults only); Best Callback Phone Number: [phone]   Per caller she said he was started on Lasix for shortness of breath a couple weeks ago.  Onset around 12-01-11 of some exertional dypsnea but is having increased fatigue.  No chest pain and no weight gain  All emergent sxs per Fatigue protocols r/o except for Geriatric person and recent onset of decreased ability to carry out ADLS or general decrease in excercise intolerance    Appt made for today at 1400 with Ms Megan Salon, Dr Arnoldo Morale schedule full

## 2011-12-04 NOTE — Progress Notes (Signed)
Subjective:    Patient ID: Roy [name], male    DOB: [birth year], 78 y.o.   MRN: [id number]  HPI 78 year old white male, nonsmoker, patient of Roy [name] is in today with complaints of shortness of breath and fatigue has been ongoing x6 weeks. He saw Roy [name] in August who started him on Lasix 20 mg to help diurese possible fluid from heart. Patient is urinating more often but his symptoms have not improved. He has a history of a myocardial infarction in 80 and 89. He has not seen cardiology in about 3-4 years. He describes the discomfort as tightness in his chest that is worse with walking extended period of time. After several minutes of sitting, his symptoms subside. Patient has a history of [facility] spotted fever and Lyme's disease. Recommend spotted fever diagnosed in March 2013 according to patient. Also has a history of rheumatic fever in 1951. Past medical history of coronary artery disease, congestive heart failure, type 2 diabetes   Review of Systems  Constitutional: Negative.   HENT: Negative.   Eyes: Negative.   Respiratory: Positive for chest tightness and shortness of breath. Negative for wheezing and stridor.   Cardiovascular: Negative for palpitations and leg swelling.       Chest pressure  Gastrointestinal: Negative.   Genitourinary: Negative.   Musculoskeletal: Negative.   Skin: Negative.   Neurological: Negative.   Hematological: Negative.   Psychiatric/Behavioral: Negative.    Past Medical History  Diagnosis Date  . CAD (coronary artery disease)   . Diabetes mellitus, type 2   . Hyperlipidemia   . Hypertension   . COPD (chronic obstructive pulmonary disease)     Stage 2    History   Social History  . Marital Status: Married    Spouse Name: N/A    Number of Children: N/A  . Years of Education: N/A   Occupational History  . Not on file.   Social History Main Topics  . Smoking status: Former Smoker    Quit date: 03/05/1958  . Smokeless tobacco:  Not on file  . Alcohol Use: No  . Drug Use: No  . Sexually Active: Not Currently   Other Topics Concern  . Not on file   Social History Narrative  . No narrative on file    Past Surgical History  Procedure Date  . Cervical laminectomy     Family History  Problem Relation Age of Onset  . ALS Father     Allergies  Allergen Reactions  . Cephalexin     REACTION: rash  . Codeine Sulfate     REACTION: chest pain    Current Outpatient Prescriptions on File Prior to Visit  Medication Sig Dispense Refill  . acetaminophen (TYLENOL) 325 MG tablet Take 650 mg by mouth every 6 (six) hours as needed. Taken every 6 hours as needed with Tramadol.       [name] alfuzosin (UROXATRAL) 10 MG 24 hr tablet Take 10 mg by mouth daily.        [name] aspirin 325 MG EC tablet Take 325 mg by mouth daily.        [name] atorvastatin (LIPITOR) 10 MG tablet Take 1 tablet (10 mg total) by mouth daily.  30 tablet  11  . betamethasone dipropionate (DIPROLENE) 0.05 % ointment APPLY EVERY DAY AS DIRECTED  30 g  3  . furosemide (LASIX) 20 MG tablet Take 1 tablet (20 mg total) by mouth daily.  30 tablet  3  . glimepiride (  AMARYL) 2 MG tablet TAKE ONE TABLET BY MOUTH EVERY DAY  30 tablet  11  . isosorbide mononitrate (IMDUR) 30 MG 24 hr tablet TAKE THREE TABLETS BY MOUTH EVERY DAY  90 tablet  3  . metFORMIN (GLUCOPHAGE-XR) 500 MG 24 hr tablet TAKE ONE TABLET BY MOUTH EVERY DAY  30 tablet  11  . metoprolol succinate (TOPROL-XL) 50 MG 24 hr tablet Take 1/2 tablet with or immediately following a meal.  30 tablet  11  . MINITRAN 0.4 MG/HR USE AS DIRECTED  30 each  11  . nitroGLYCERIN (NITROSTAT) 0.4 MG SL tablet Place 0.4 mg under the tongue every 5 (five) minutes as needed.        . potassium chloride SA (KLOR-CON M15) 15 MEQ tablet Take 15 mEq by mouth 2 (two) times daily.        . ranitidine (ZANTAC) 300 MG tablet TAKE ONE TABLET BY MOUTH TWICE DAILY  180 tablet  3  . traMADol (ULTRAM) 50 MG tablet TAKE 1 TABLET WITH TYLENOL  325MG  EVERY 6 HOURS AS NEEDED FOR PAIN (THIS IS SUBSTITUTE FOR DARVOCET-N)  30 tablet  6  . traZODone (DESYREL) 150 MG tablet TAKE ONE TABLET BY MOUTH EVERY DAY  30 tablet  2  . DISCONTD: ADVAIR DISKUS 100-50 MCG/DOSE AEPB INHALE ONE PUFF BY MOUTH EVERY DAY  60 each  8    BP 126/80  Pulse 64  Temp 97.7 F (36.5 C) (Oral)  Wt 201 lb (91.173 kg)  SpO2 98%chart    Objective:   Physical Exam  Constitutional: He is oriented to person, place, and time. He appears well-developed and well-nourished.  HENT:  Right Ear: External ear normal.  Left Ear: External ear normal.  Nose: Nose normal.  Mouth/Throat: Oropharynx is clear and moist.  Neck: Normal range of motion. Neck supple.  Cardiovascular: Normal rate, regular rhythm and normal heart sounds.   Pulmonary/Chest: Effort normal and breath sounds normal.  Abdominal: Bowel sounds are normal.  Musculoskeletal: Normal range of motion.  Neurological: He is alert and oriented to person, place, and time.  Skin: Skin is warm and dry.  Psychiatric: He has a normal mood and affect.          Assessment & Plan:  Assessment: Dyspnea, fatigue chest pressure,   Plan: After consultation with cardiologist Roy [name] he will see him in the office later on this week. Patient advised if he experiences any increase in chest pain, shortness of breath, nausea or vomiting to report to the emergency department immediately. Lab sent. Will follow patient pending results, after referral, and sooner when necessary.  Appointment tomorrow with Roy [name], Roy [name]. Patient is aware.

## 2011-12-05 ENCOUNTER — Encounter: Payer: Self-pay | Admitting: Cardiology

## 2011-12-05 ENCOUNTER — Ambulatory Visit (INDEPENDENT_AMBULATORY_CARE_PROVIDER_SITE_OTHER): Payer: PRIVATE HEALTH INSURANCE | Admitting: Cardiology

## 2011-12-05 VITALS — BP 140/78 | HR 62 | Ht 69.0 in | Wt 199.1 lb

## 2011-12-05 DIAGNOSIS — R079 Chest pain, unspecified: Secondary | ICD-10-CM

## 2011-12-05 DIAGNOSIS — I10 Essential (primary) hypertension: Secondary | ICD-10-CM

## 2011-12-05 DIAGNOSIS — I2510 Atherosclerotic heart disease of native coronary artery without angina pectoris: Secondary | ICD-10-CM

## 2011-12-05 DIAGNOSIS — N189 Chronic kidney disease, unspecified: Secondary | ICD-10-CM

## 2011-12-05 DIAGNOSIS — E785 Hyperlipidemia, unspecified: Secondary | ICD-10-CM

## 2011-12-05 MED ORDER — METOPROLOL SUCCINATE ER 50 MG PO TB24: 50.0000 mg | ORAL_TABLET | Freq: Every day | ORAL | Status: DC

## 2011-12-05 NOTE — Patient Instructions (Addendum)
Increase Toprol XL to 50mg  daily.  Use nitroglycerin as needed for chest pain.  Your physician has requested that you have en exercise stress myoview. For further information please visit [email or web address]. Please follow instruction sheet, as given. TOMORROW  Your physician has requested that you have an echocardiogram. Echocardiography is a painless test that uses sound waves to create images of your heart. It provides your doctor with information about the size and shape of your heart and how well your heart's chambers and valves are working. This procedure takes approximately one hour. There are no restrictions for this procedure.  Your physician recommends that you schedule a follow-up appointment in: 1 week with Dr Aundra Dubin after the testing has been completed.

## 2011-12-06 ENCOUNTER — Other Ambulatory Visit: Payer: Self-pay

## 2011-12-06 ENCOUNTER — Ambulatory Visit (HOSPITAL_COMMUNITY): Payer: Medicare Other | Attending: Cardiology | Admitting: Radiology

## 2011-12-06 ENCOUNTER — Other Ambulatory Visit (HOSPITAL_COMMUNITY): Payer: PRIVATE HEALTH INSURANCE

## 2011-12-06 VITALS — BP 135/67 | HR 61 | Ht 69.0 in | Wt 200.0 lb

## 2011-12-06 DIAGNOSIS — E119 Type 2 diabetes mellitus without complications: Secondary | ICD-10-CM | POA: Insufficient documentation

## 2011-12-06 DIAGNOSIS — R079 Chest pain, unspecified: Secondary | ICD-10-CM

## 2011-12-06 DIAGNOSIS — R0609 Other forms of dyspnea: Secondary | ICD-10-CM | POA: Insufficient documentation

## 2011-12-06 DIAGNOSIS — R072 Precordial pain: Secondary | ICD-10-CM

## 2011-12-06 DIAGNOSIS — R0989 Other specified symptoms and signs involving the circulatory and respiratory systems: Secondary | ICD-10-CM | POA: Insufficient documentation

## 2011-12-06 DIAGNOSIS — I10 Essential (primary) hypertension: Secondary | ICD-10-CM | POA: Insufficient documentation

## 2011-12-06 DIAGNOSIS — I369 Nonrheumatic tricuspid valve disorder, unspecified: Secondary | ICD-10-CM | POA: Insufficient documentation

## 2011-12-06 DIAGNOSIS — J4489 Other specified chronic obstructive pulmonary disease: Secondary | ICD-10-CM | POA: Insufficient documentation

## 2011-12-06 DIAGNOSIS — R0789 Other chest pain: Secondary | ICD-10-CM | POA: Insufficient documentation

## 2011-12-06 DIAGNOSIS — J449 Chronic obstructive pulmonary disease, unspecified: Secondary | ICD-10-CM | POA: Insufficient documentation

## 2011-12-06 DIAGNOSIS — I2510 Atherosclerotic heart disease of native coronary artery without angina pectoris: Secondary | ICD-10-CM

## 2011-12-06 DIAGNOSIS — I379 Nonrheumatic pulmonary valve disorder, unspecified: Secondary | ICD-10-CM | POA: Insufficient documentation

## 2011-12-06 DIAGNOSIS — R0602 Shortness of breath: Secondary | ICD-10-CM

## 2011-12-06 DIAGNOSIS — N189 Chronic kidney disease, unspecified: Secondary | ICD-10-CM | POA: Insufficient documentation

## 2011-12-06 MED ORDER — NITROGLYCERIN 0.4 MG SL SUBL: 0.4000 mg | SUBLINGUAL_TABLET | SUBLINGUAL | Status: DC | PRN

## 2011-12-06 MED ORDER — TECHNETIUM TC 99M SESTAMIBI GENERIC - CARDIOLITE
33.0000 | Freq: Once | INTRAVENOUS | Status: AC | PRN
  Administered 2011-12-06: 33 via INTRAVENOUS

## 2011-12-06 MED ORDER — TECHNETIUM TC 99M SESTAMIBI GENERIC - CARDIOLITE
11.0000 | Freq: Once | INTRAVENOUS | Status: AC | PRN
  Administered 2011-12-06: 11 via INTRAVENOUS

## 2011-12-06 NOTE — Progress Notes (Signed)
Echocardiogram performed.  

## 2011-12-06 NOTE — Progress Notes (Addendum)
[place] [facility]LE[facility] [address] [id number] Roy [name] [id number] [phone]  Cardiology Nuclear Med Study  Assael Loeffelholz Basu is a 78 y.o. male     MRN : [id number]     DOB: [birth year]  Procedure Date: 12/06/2011  Nuclear Med Background Indication for Stress Test:  Evaluation for Ischemia and PTCA Patency History:  '99 PTCA-Dx1; '09 MPS:No ischemia, EF=60% Cardiac Risk Factors: History of Smoking, Hypertension, Lipids and NIDDM  Symptoms:  Chest Tightness with and without Exertion (last episode of chest discomfort is now, 4-5/10), DOE and Fatigue   Nuclear Pre-Procedure Caffeine/Decaff Intake:  None NPO After: 8:00am   Lungs:  Clear. O2 Sat: 97% on room air. IV 0.9% NS with Angio Cath:  20g  IV Site: R Wrist  IV Started by:  Eliezer Lofts, EMT-P  Chest Size (in):  42 Cup Size: n/a  Height: 5\' 9"  (1.753 m)  Weight:  200 lb (90.719 kg)  BMI:  Body mass index is 29.53 kg/(m^2). Tech Comments:  Toprol held > 24 hours, per patient.    Nuclear Med Study 1 or 2 day study: 1 day  Stress Test Type:  Stress  Reading MD: [name], MD  Order Authorizing Provider:  [name], MD  Resting Radionuclide: Technetium 64m Sestamibi  Resting Radionuclide Dose: 10.8 mCi   Stress Radionuclide:  Technetium 77m Sestamibi  Stress Radionuclide Dose: 32.8 mCi           Stress Protocol Rest HR: 61 Stress HR: 130  Rest BP: 135/67 Stress BP: 179/60  Exercise Time (min): 7:01 METS: 8.5   Predicted Max HR: 143 bpm % Max HR: 90.91 bpm Rate Pressure Product: 23270   Dose of Adenosine (mg):  n/a Dose of Lexiscan: n/a mg  Dose of Atropine (mg): n/a Dose of Dobutamine: n/a mg  Stress Test Technologist: Letta Moynahan, CMA-N  Nuclear Technologist:  Charlton Amor, CNMT     Rest Procedure:  Myocardial perfusion imaging was performed at rest 45 minutes following the intravenous administration of Technetium 71m Sestamibi.  Rest ECG: No acute changes, nonspecific  T-wave changes in lead III.  Stress Procedure:  The patient exercised on the treadmill utilizing the Bruce protocol for 7:01 minutes. He then stopped due to fatigue.  He c/o chest tightness, 4-5/10, prior to exercise; that increased to 8/10 with exercise.  There were significant ST-T wave changes and occasional PAC's noted.  Technetium 51m Sestamibi was injected at peak exercise and myocardial perfusion imaging was performed after a brief delay.  Stress ECG: Significant ST abnormalities consistent with ischemia.  QPS Raw Data Images:  Normal; no motion artifact; normal heart/lung ratio. Stress Images:  Small, mild basal inferior perfusion defect.  Rest Images:  Small, mild basal inferior perfusion defect.  Subtraction (SDS):  Fixed, small mild basal inferior perfusion defect.  Transient Ischemic Dilatation (Normal <1.22):  1.06 Lung/Heart Ratio (Normal <0.45):  0.29  Quantitative Gated Spect Images QGS EDV:  90 ml QGS ESV:  31 ml  Impression Exercise Capacity:  Fair exercise capacity. BP Response:  Normal blood pressure response. Clinical Symptoms:  Chest tightness prior to exercise that persisted throughout exercise and into recovery.  Dyspnea.  ECG Impression:  1-2 mm horizontal ST depression V3-V5 and inferiorly.  Comparison with Prior Nuclear Study: No significant change from previous study  Overall Impression:  Low risk stress nuclear study.  There is a small, fixed, mild basal inferior perfusion defect.  With normal wall motion, this seems most  likely to be due to diaphragmatic attenuation.  TID ratio is not elevated.  However, there were ischemic-type changes by exercise ECG.  He had chest pain prior to walking that continued throughout exercise.  At this point, given elevated creatinine, I think that a period of medical management would be reasonable given the lack of a significant perfusion defect.   LV Ejection Fraction: 65%.  LV Wall Motion:  NL LV Function; NL Wall  Motion  [name] 12/06/2011  Overall low risk study.  He had changes on ECG but no significant ischemia on perfusion images.  I would plan medical management for now with close followup with me.  If we can avoid cath with medical titration, would be best given his renal insufficiency.   [name] 12/07/2011 11:28 AM

## 2011-12-06 NOTE — Progress Notes (Signed)
Patient ID: [name], male   DOB: 1932/10/02, 78 y.o.   MRN: [id number] PCP: Dr. Arnoldo Morale  78 yo with history of CAD s/p PTCA D1 in 1999, CKD, diabetes, HTN, and hyperlipidemia presents for cardiology evaluation.  He has not seen a cardiologist in many years.  For the last 3-4 months, he has noted dyspnea and chest tightness after walking about 50 yards.  This has been slowly progressive.  The pain will resolve with rest.  Shorter distances do not bring on the dyspnea/chest tightness.  No symptoms at rest.  Climbing steps fast will bring on the symptoms.  A month or so ago, he saw Dr. Arnoldo Morale and was started on Lasix given the dyspnea.  Lasix did not help.  He was told to stop Lasix yesterday after BMET showed creatinine 2.0 (actually not far off his baseline).    ECG: NSR, normal  Labs (5/13): LDL 37, HDL 33 Labs (8/13): BNP 38 Labs (10/13): K 4, creatinine 2.0  PMH: 1. H/o [place] Spotted Fever in 3/13.  2. H/o Lyme disease in 2008. 3. CAD: PTCA D1 in 1999.  Adenosine myoview in (10/09) with EF 60%, no ischemia or infarction.  4. CKD 5. H/o rheumatic fever in 1951 6. Type II diabetes 7. COPD  8. Hyperlipidemia 9. HTN  SH: Married, lives in [place].  Quit smoking in 1960.  Likes to hunt.    FH: CAD  ROS: All systems reviewed and negative except as per HPI.   Current Outpatient Prescriptions  Medication Sig Dispense Refill  . acetaminophen (TYLENOL) 325 MG tablet Take 650 mg by mouth every 6 (six) hours as needed. Taken every 6 hours as needed with Tramadol.       [name] alfuzosin (UROXATRAL) 10 MG 24 hr tablet Take 10 mg by mouth daily.        [name] aspirin 325 MG EC tablet Take 325 mg by mouth daily.        [name] atorvastatin (LIPITOR) 10 MG tablet Take 1 tablet (10 mg total) by mouth daily.  30 tablet  11  . betamethasone dipropionate (DIPROLENE) 0.05 % ointment APPLY EVERY DAY AS DIRECTED  30 g  3  . Fluticasone-Salmeterol (ADVAIR DISKUS) 100-50 MCG/DOSE AEPB 1 puff 2 (two) times  daily.       [name] glimepiride (AMARYL) 2 MG tablet TAKE ONE TABLET BY MOUTH EVERY DAY  30 tablet  11  . isosorbide mononitrate (IMDUR) 30 MG 24 hr tablet TAKE THREE TABLETS BY MOUTH EVERY DAY  90 tablet  3  . metFORMIN (GLUCOPHAGE-XR) 500 MG 24 hr tablet TAKE ONE TABLET BY MOUTH EVERY DAY  30 tablet  11  . nitroGLYCERIN (NITROSTAT) 0.4 MG SL tablet Place 0.4 mg under the tongue every 5 (five) minutes as needed.        . potassium chloride SA (KLOR-CON M15) 15 MEQ tablet Take 15 mEq by mouth 2 (two) times daily.        . ranitidine (ZANTAC) 300 MG tablet TAKE ONE TABLET BY MOUTH TWICE DAILY  180 tablet  3  . traMADol (ULTRAM) 50 MG tablet TAKE 1 TABLET WITH TYLENOL 325MG  EVERY 6 HOURS AS NEEDED FOR PAIN (THIS IS SUBSTITUTE FOR DARVOCET-N)  30 tablet  6  . traZODone (DESYREL) 150 MG tablet TAKE ONE TABLET BY MOUTH EVERY DAY  30 tablet  2  . metoprolol succinate (TOPROL-XL) 50 MG 24 hr tablet Take 1 tablet (50 mg total) by mouth daily. Take with or immediately  following a meal.  90 tablet  3    BP 140/78  Pulse 62  Ht 5\' 9"  (1.753 m)  Wt 199 lb 1.9 oz (90.32 kg)  BMI 29.40 kg/m2  SpO2 96% General: NAD Neck: No JVD, no thyromegaly or thyroid nodule.  Lungs: Clear to auscultation bilaterally with normal respiratory effort. CV: Nondisplaced PMI.  Heart regular S1/S2, no S3/S4, no murmur.  No peripheral edema.  No carotid bruit.  Normal pedal pulses.  Abdomen: Soft, nontender, no hepatosplenomegaly, no distention.  Skin: Intact without lesions or rashes.  Neurologic: Alert and oriented x 3.  Psych: Normal affect. Extremities: No clubbing or cyanosis.  HEENT: Normal.   Assessment/Plan: 1. CAD: Exertional chest pain and dyspnea is relatively stable.  Symptoms strongly suggest angina.  Given the stability of the symptoms, I am going to start with an ETT-myoview rather than a cath.  His creatinine is 2.0.  If the ETT-myoview is low risk, will continue to medically manage.  If the myoview is moderate  to high risk, I will plan a cath using precautions for contrast nephrology.   - ETT-myoview - Continue ASA, Imdur, atorvastatin - Increase Toprol XL to 50 mg daily as part of anti-anginal regimen.    - Prescription for sublingual NTG.  2. Dyspnea: Suspect this is part of his anginal symptom complex.  I will, however, get an echocardiogram to assess LV function.  3. HTN: BP borderline elevated.  As above, increasing Toprol XL. 4. CKD: Creatinine about 2 at baseline.  Given CKD, I am going to start with myoview rather than cath (renal fxn stable).    [name] 12/06/2011

## 2011-12-07 ENCOUNTER — Telehealth: Payer: Self-pay | Admitting: *Deleted

## 2011-12-07 NOTE — Telephone Encounter (Signed)
Mr Gorelik given results of stress test

## 2011-12-10 ENCOUNTER — Encounter: Payer: Self-pay | Admitting: Cardiology

## 2011-12-10 ENCOUNTER — Encounter (HOSPITAL_COMMUNITY): Payer: PRIVATE HEALTH INSURANCE

## 2011-12-10 ENCOUNTER — Ambulatory Visit (INDEPENDENT_AMBULATORY_CARE_PROVIDER_SITE_OTHER): Payer: Medicare Other | Admitting: Cardiology

## 2011-12-10 VITALS — BP 128/76 | HR 80 | Ht 69.0 in | Wt 201.1 lb

## 2011-12-10 DIAGNOSIS — R0989 Other specified symptoms and signs involving the circulatory and respiratory systems: Secondary | ICD-10-CM

## 2011-12-10 DIAGNOSIS — N189 Chronic kidney disease, unspecified: Secondary | ICD-10-CM

## 2011-12-10 DIAGNOSIS — R079 Chest pain, unspecified: Secondary | ICD-10-CM

## 2011-12-10 DIAGNOSIS — I2510 Atherosclerotic heart disease of native coronary artery without angina pectoris: Secondary | ICD-10-CM

## 2011-12-10 DIAGNOSIS — E785 Hyperlipidemia, unspecified: Secondary | ICD-10-CM

## 2011-12-10 DIAGNOSIS — I10 Essential (primary) hypertension: Secondary | ICD-10-CM

## 2011-12-10 LAB — BASIC METABOLIC PANEL
Calcium: 9.3 mg/dL (ref 8.4–10.5)
GFR: 37.16 mL/min — ABNORMAL LOW (ref >60.00)
Glucose, Bld: 292 mg/dL — ABNORMAL HIGH (ref 70–99)
Potassium: 4.5 mEq/L (ref 3.5–5.1)
Sodium: 136 mEq/L (ref 135–145)

## 2011-12-10 MED ORDER — RANOLAZINE ER 500 MG PO TB12: 500.0000 mg | ORAL_TABLET | Freq: Two times a day (BID) | ORAL | Status: DC

## 2011-12-10 NOTE — Patient Instructions (Addendum)
Start Ranexa 500mg  two times a day.  Your physician recommends that you have  lab work today--BMET/BNP.  Your physician recommends that you schedule a follow-up appointment in: 2 weeks with Dr Aundra Dubin.

## 2011-12-10 NOTE — Progress Notes (Signed)
Patient ID: Roy [name], male   DOB: [birth year], 78 y.o.   MRN: [id number] PCP: Dr. Arnoldo Morale  78 y.o with history of CAD s/p PTCA D1 in 1999, CKD, diabetes, HTN, and hyperlipidemia presents for cardiology followup.  For the last 3-4 months, he has noted dyspnea and chest tightness after walking about 50 yards.  This has been slowly progressive.  The pain will resolve with rest.  Shorter distances do not bring on the dyspnea/chest tightness.  No symptoms at rest.  Climbing steps fast will bring on the symptoms.  A month or so ago, he saw Dr. Arnoldo Morale and was started on Lasix given the dyspnea.  Lasix did not help.  He was told to stop Lasix after BMET showed creatinine 2.0 (actually not far off his baseline).    When I saw him, I decided to to a stress test rather than cath because of his CKD.  ETT-myoview in 10/13 showed ischemic ECG changes but no TID and only a small fixed basal inferior defect with normal wall motion and EF 65%.   This was overall a low risk study.  Echo in 10/13 showed EF 55-60% with no major abnormalities.  He continues to have the same symptoms as prior.    Labs (5/13): LDL 37, HDL 33 Labs (8/13): BNP 38 Labs (10/13): K 4, creatinine 2.0  PMH: 1. H/o [place] Spotted Fever in 3/13.  2. H/o Lyme disease in 2008. 3. CAD: PTCA D1 in 1999.  Adenosine myoview in (10/09) with EF 60%, no ischemia or infarction.  ETT-sestamibi (10/13) with 7'1" exercise, ischemic ECG changes, small fixed mild basal inferior defect with EF 65% and normal wall motion.  TID not elevated.  This was a low risk study overall.  Echo (10/13): EF 55-60%, normal RV size and systolic function, normal valves.  4. CKD 5. H/o rheumatic fever in 1951 6. Type II diabetes 7. COPD  8. Hyperlipidemia 9. HTN  SH: Married, lives in [place].  Quit smoking in 1960.  Likes to hunt.    FH: CAD  ROS: All systems reviewed and negative except as per HPI.   Current Outpatient Prescriptions  Medication Sig Dispense  Refill  . acetaminophen (TYLENOL) 325 MG tablet Take 650 mg by mouth every 6 (six) hours as needed. Taken every 6 hours as needed with Tramadol.       [name] alfuzosin (UROXATRAL) 10 MG 24 hr tablet Take 10 mg by mouth daily.        [name] aspirin 325 MG EC tablet Take 325 mg by mouth daily.        [name] atorvastatin (LIPITOR) 10 MG tablet Take 1 tablet (10 mg total) by mouth daily.  30 tablet  11  . betamethasone dipropionate (DIPROLENE) 0.05 % ointment APPLY EVERY DAY AS DIRECTED  30 g  3  . Fluticasone-Salmeterol (ADVAIR DISKUS) 100-50 MCG/DOSE AEPB 1 puff 2 (two) times daily.       [name] glimepiride (AMARYL) 2 MG tablet TAKE ONE TABLET BY MOUTH EVERY DAY  30 tablet  11  . isosorbide mononitrate (IMDUR) 30 MG 24 hr tablet TAKE THREE TABLETS BY MOUTH EVERY DAY  90 tablet  3  . metFORMIN (GLUCOPHAGE-XR) 500 MG 24 hr tablet TAKE ONE TABLET BY MOUTH EVERY DAY  30 tablet  11  . metoprolol succinate (TOPROL-XL) 50 MG 24 hr tablet Take 1 tablet (50 mg total) by mouth daily. Take with or immediately following a meal.  90 tablet  3  .  nitroGLYCERIN (NITROSTAT) 0.4 MG SL tablet Place 1 tablet (0.4 mg total) under the tongue every 5 (five) minutes as needed.  25 tablet  3  . potassium chloride SA (KLOR-CON M15) 15 MEQ tablet Take 15 mEq by mouth 2 (two) times daily.        . ranitidine (ZANTAC) 300 MG tablet TAKE ONE TABLET BY MOUTH TWICE DAILY  180 tablet  3  . traMADol (ULTRAM) 50 MG tablet TAKE 1 TABLET WITH TYLENOL 325MG  EVERY 6 HOURS AS NEEDED FOR PAIN (THIS IS SUBSTITUTE FOR DARVOCET-N)  30 tablet  6  . traZODone (DESYREL) 150 MG tablet TAKE ONE TABLET BY MOUTH EVERY DAY  30 tablet  2  . ranolazine (RANEXA) 500 MG 12 hr tablet Take 1 tablet (500 mg total) by mouth 2 (two) times daily.  60 tablet  6    BP 128/76  Pulse 80  Ht 5\' 9"  (1.753 m)  Wt 201 lb 1.9 oz (91.227 kg)  BMI 29.70 kg/m2 General: NAD Neck: No JVD, no thyromegaly or thyroid nodule.  Lungs: Clear to auscultation bilaterally with normal  respiratory effort. CV: Nondisplaced PMI.  Heart regular S1/S2, no S3/S4, no murmur.  No peripheral edema.  No carotid bruit.  Normal pedal pulses.  Abdomen: Soft, nontender, no hepatosplenomegaly, no distention.  Skin: Intact without lesions or rashes.  Neurologic: Alert and oriented x 3.  Psych: Normal affect. Extremities: No clubbing or cyanosis.  HEENT: Normal.   Assessment/Plan: 1. CAD: Exertional chest pain and dyspnea that has been relatively stable.  Symptoms strongly suggest angina.  Given the stability of the symptoms, I had him start with an ETT-sestamibi.  This was overall a low risk study.   - Given significant CKD and low risk study, I think it would be most appropriate to manage medically.  At last appointment, I increased Toprol XL.  Today, I will add ranolazine 500 mg bid.  I will see him back in followup in 2 wks and will get an ECG for QT interval at that time.  If symptoms are progressive despite meds, would at that point plan cardiac cath.   - Continue ASA, Imdur, atorvastatin, Toprol XL.  2. Dyspnea: This may be part of his anginal symptom complex.  He does not appear volume overloaded on exam and there were no significant abnormalities on echo.  I will check a BMET and a BNP since he has stopped Lasix.  3. HTN: BP good today. 4. CKD: Creatinine about 2 at baseline.    [name] 76/09/2011

## 2011-12-10 NOTE — Progress Notes (Signed)
Discussed at appt 12/10/11 with Dr Aundra Dubin.

## 2011-12-17 ENCOUNTER — Telehealth: Payer: Self-pay | Admitting: Internal Medicine

## 2011-12-17 MED ORDER — GLIMEPIRIDE 2 MG PO TABS: 2.0000 mg | ORAL_TABLET | Freq: Every day | ORAL | Status: DC

## 2011-12-17 NOTE — Telephone Encounter (Signed)
done

## 2011-12-17 NOTE — Telephone Encounter (Signed)
[facility] called and said that pt needs a refill of glimepiride (AMARYL) 2 MG tablet

## 2011-12-26 ENCOUNTER — Encounter (HOSPITAL_COMMUNITY): Payer: Self-pay | Admitting: *Deleted

## 2011-12-26 ENCOUNTER — Ambulatory Visit: Payer: Medicare Other | Admitting: Cardiology

## 2011-12-26 ENCOUNTER — Inpatient Hospital Stay (HOSPITAL_COMMUNITY)
Admission: EM | Admit: 2011-12-26 | Discharge: 2011-12-28 | DRG: 194 | Disposition: A | Discharge status: Home / Self Care | Payer: Medicare Other | Attending: Internal Medicine | Admitting: Internal Medicine

## 2011-12-26 ENCOUNTER — Emergency Department (HOSPITAL_COMMUNITY): Payer: Medicare Other

## 2011-12-26 DIAGNOSIS — E1129 Type 2 diabetes mellitus with other diabetic kidney complication: Secondary | ICD-10-CM | POA: Diagnosis present

## 2011-12-26 DIAGNOSIS — N189 Chronic kidney disease, unspecified: Secondary | ICD-10-CM

## 2011-12-26 DIAGNOSIS — I252 Old myocardial infarction: Secondary | ICD-10-CM

## 2011-12-26 DIAGNOSIS — J4489 Other specified chronic obstructive pulmonary disease: Secondary | ICD-10-CM

## 2011-12-26 DIAGNOSIS — G4730 Sleep apnea, unspecified: Secondary | ICD-10-CM

## 2011-12-26 DIAGNOSIS — R5383 Other fatigue: Secondary | ICD-10-CM

## 2011-12-26 DIAGNOSIS — M479 Spondylosis, unspecified: Secondary | ICD-10-CM | POA: Diagnosis present

## 2011-12-26 DIAGNOSIS — E785 Hyperlipidemia, unspecified: Secondary | ICD-10-CM

## 2011-12-26 DIAGNOSIS — I10 Essential (primary) hypertension: Secondary | ICD-10-CM

## 2011-12-26 DIAGNOSIS — J189 Pneumonia, unspecified organism: Secondary | ICD-10-CM

## 2011-12-26 DIAGNOSIS — R509 Fever, unspecified: Secondary | ICD-10-CM

## 2011-12-26 DIAGNOSIS — I129 Hypertensive chronic kidney disease with stage 1 through stage 4 chronic kidney disease, or unspecified chronic kidney disease: Secondary | ICD-10-CM | POA: Diagnosis present

## 2011-12-26 DIAGNOSIS — G20A1 Parkinson's disease without dyskinesia, without mention of fluctuations: Secondary | ICD-10-CM

## 2011-12-26 DIAGNOSIS — I25119 Atherosclerotic heart disease of native coronary artery with unspecified angina pectoris: Secondary | ICD-10-CM | POA: Diagnosis present

## 2011-12-26 DIAGNOSIS — N183 Chronic kidney disease, stage 3 unspecified: Secondary | ICD-10-CM | POA: Diagnosis present

## 2011-12-26 DIAGNOSIS — R361 Hematospermia: Secondary | ICD-10-CM

## 2011-12-26 DIAGNOSIS — N1832 Chronic kidney disease, stage 3b: Secondary | ICD-10-CM | POA: Diagnosis present

## 2011-12-26 DIAGNOSIS — L570 Actinic keratosis: Secondary | ICD-10-CM

## 2011-12-26 DIAGNOSIS — K219 Gastro-esophageal reflux disease without esophagitis: Secondary | ICD-10-CM | POA: Diagnosis present

## 2011-12-26 DIAGNOSIS — I2510 Atherosclerotic heart disease of native coronary artery without angina pectoris: Secondary | ICD-10-CM

## 2011-12-26 DIAGNOSIS — Z7982 Long term (current) use of aspirin: Secondary | ICD-10-CM

## 2011-12-26 DIAGNOSIS — R5381 Other malaise: Secondary | ICD-10-CM

## 2011-12-26 DIAGNOSIS — Z79899 Other long term (current) drug therapy: Secondary | ICD-10-CM

## 2011-12-26 DIAGNOSIS — J449 Chronic obstructive pulmonary disease, unspecified: Secondary | ICD-10-CM

## 2011-12-26 DIAGNOSIS — R079 Chest pain, unspecified: Secondary | ICD-10-CM

## 2011-12-26 DIAGNOSIS — M549 Dorsalgia, unspecified: Secondary | ICD-10-CM

## 2011-12-26 DIAGNOSIS — H6120 Impacted cerumen, unspecified ear: Secondary | ICD-10-CM

## 2011-12-26 DIAGNOSIS — E119 Type 2 diabetes mellitus without complications: Secondary | ICD-10-CM

## 2011-12-26 DIAGNOSIS — Z87891 Personal history of nicotine dependence: Secondary | ICD-10-CM

## 2011-12-26 DIAGNOSIS — E1159 Type 2 diabetes mellitus with other circulatory complications: Secondary | ICD-10-CM | POA: Diagnosis present

## 2011-12-26 DIAGNOSIS — N182 Chronic kidney disease, stage 2 (mild): Secondary | ICD-10-CM

## 2011-12-26 DIAGNOSIS — E1169 Type 2 diabetes mellitus with other specified complication: Secondary | ICD-10-CM | POA: Diagnosis present

## 2011-12-26 DIAGNOSIS — N413 Prostatocystitis: Secondary | ICD-10-CM

## 2011-12-26 DIAGNOSIS — J4530 Mild persistent asthma, uncomplicated: Secondary | ICD-10-CM | POA: Diagnosis present

## 2011-12-26 DIAGNOSIS — G20 Parkinson's disease: Secondary | ICD-10-CM

## 2011-12-26 DIAGNOSIS — J441 Chronic obstructive pulmonary disease with (acute) exacerbation: Secondary | ICD-10-CM | POA: Diagnosis present

## 2011-12-26 DIAGNOSIS — N200 Calculus of kidney: Secondary | ICD-10-CM

## 2011-12-26 HISTORY — DX: Angina pectoris, unspecified: I20.9

## 2011-12-26 HISTORY — DX: Pneumonia, unspecified organism: J18.9

## 2011-12-26 HISTORY — DX: Sleep apnea, unspecified: G47.30

## 2011-12-26 HISTORY — DX: Unspecified osteoarthritis, unspecified site: M19.90

## 2011-12-26 HISTORY — DX: Lyme disease, unspecified: A69.20

## 2011-12-26 HISTORY — DX: Cardiac murmur, unspecified: R01.1

## 2011-12-26 HISTORY — DX: Shortness of breath: R06.02

## 2011-12-26 HISTORY — DX: Gastro-esophageal reflux disease without esophagitis: K21.9

## 2011-12-26 HISTORY — DX: Chronic kidney disease, unspecified: N18.9

## 2011-12-26 HISTORY — DX: Fever, unspecified: R50.9

## 2011-12-26 HISTORY — DX: Acute myocardial infarction, unspecified: I21.9

## 2011-12-26 LAB — CBC WITH DIFFERENTIAL/PLATELET
Lymphocytes Relative: 4 % — ABNORMAL LOW (ref 12–46)
Lymphs Abs: 0.5 10*3/uL — ABNORMAL LOW (ref 0.7–4.0)
Neutrophils Relative %: 88 % — ABNORMAL HIGH (ref 43–77)
Platelets: DECREASED 10*3/uL (ref 150–400)
RBC: 4.63 MIL/uL (ref 4.22–5.81)
WBC: 12.2 10*3/uL — ABNORMAL HIGH (ref 4.0–10.5)

## 2011-12-26 LAB — BASIC METABOLIC PANEL
BUN: 24 mg/dL — ABNORMAL HIGH (ref 6–23)
Chloride: 96 mEq/L (ref 96–112)
GFR calc Af Amer: 42 mL/min — ABNORMAL LOW (ref >90)
Potassium: 3.9 mEq/L (ref 3.5–5.1)

## 2011-12-26 LAB — POCT I-STAT 3, ART BLOOD GAS (G3+)
O2 Saturation: 96 %
pCO2 arterial: 38 mmHg (ref 35.0–45.0)
pH, Arterial: 7.433 (ref 7.350–7.450)
pO2, Arterial: 76 mmHg — ABNORMAL LOW (ref 80.0–100.0)

## 2011-12-26 LAB — EXPECTORATED SPUTUM ASSESSMENT W GRAM STAIN, RFLX TO RESP C

## 2011-12-26 LAB — GLUCOSE, CAPILLARY
Glucose-Capillary: 209 mg/dL — ABNORMAL HIGH (ref 70–99)
Glucose-Capillary: 153 mg/dL — ABNORMAL HIGH (ref 70–99)

## 2011-12-26 LAB — TROPONIN I: Troponin I: <0.3 ng/mL (ref <0.30)

## 2011-12-26 MED ORDER — INSULIN ASPART 100 UNIT/ML ~~LOC~~ SOLN
0.0000 [IU] | Freq: Three times a day (TID) | SUBCUTANEOUS | Status: DC
  Administered 2011-12-26: 5 [IU] via SUBCUTANEOUS
  Administered 2011-12-26 – 2011-12-28 (×4): 3 [IU] via SUBCUTANEOUS

## 2011-12-26 MED ORDER — NITROGLYCERIN 0.4 MG SL SUBL: 0.4000 mg | SUBLINGUAL_TABLET | SUBLINGUAL | Status: DC | PRN

## 2011-12-26 MED ORDER — RANOLAZINE ER 500 MG PO TB12
500.0000 mg | ORAL_TABLET | Freq: Two times a day (BID) | ORAL | Status: DC
  Administered 2011-12-26 – 2011-12-28 (×5): 500 mg via ORAL
  Filled 2011-12-26 (×6): qty 1

## 2011-12-26 MED ORDER — ACETAMINOPHEN 325 MG PO TABS
650.0000 mg | ORAL_TABLET | Freq: Two times a day (BID) | ORAL | Status: DC
  Administered 2011-12-26 – 2011-12-28 (×4): 650 mg via ORAL
  Filled 2011-12-26 (×5): qty 2

## 2011-12-26 MED ORDER — ATORVASTATIN CALCIUM 10 MG PO TABS
10.0000 mg | ORAL_TABLET | Freq: Every day | ORAL | Status: DC
  Administered 2011-12-26 – 2011-12-27 (×2): 10 mg via ORAL
  Filled 2011-12-26 (×3): qty 1

## 2011-12-26 MED ORDER — ACETAMINOPHEN 325 MG PO TABS
650.0000 mg | ORAL_TABLET | Freq: Once | ORAL | Status: AC
  Administered 2011-12-26: 650 mg via ORAL
  Filled 2011-12-26: qty 2

## 2011-12-26 MED ORDER — ISOSORBIDE MONONITRATE ER 60 MG PO TB24
90.0000 mg | ORAL_TABLET | Freq: Every day | ORAL | Status: DC
  Administered 2011-12-26 – 2011-12-28 (×3): 90 mg via ORAL
  Filled 2011-12-26 (×3): qty 1

## 2011-12-26 MED ORDER — TRAZODONE HCL 150 MG PO TABS
150.0000 mg | ORAL_TABLET | Freq: Every day | ORAL | Status: DC
  Administered 2011-12-26 – 2011-12-27 (×2): 150 mg via ORAL
  Filled 2011-12-26 (×3): qty 1

## 2011-12-26 MED ORDER — ALBUTEROL SULFATE (5 MG/ML) 0.5% IN NEBU
2.5000 mg | INHALATION_SOLUTION | Freq: Four times a day (QID) | RESPIRATORY_TRACT | Status: DC
  Administered 2011-12-26 – 2011-12-28 (×7): 2.5 mg via RESPIRATORY_TRACT
  Filled 2011-12-26 (×8): qty 0.5

## 2011-12-26 MED ORDER — LEVOFLOXACIN IN D5W 750 MG/150ML IV SOLN: 750.0000 mg | INTRAVENOUS | Status: DC

## 2011-12-26 MED ORDER — FAMOTIDINE 10 MG PO TABS
10.0000 mg | ORAL_TABLET | Freq: Two times a day (BID) | ORAL | Status: DC
  Administered 2011-12-26 – 2011-12-28 (×5): 10 mg via ORAL
  Filled 2011-12-26 (×6): qty 1

## 2011-12-26 MED ORDER — GUAIFENESIN-DM 100-10 MG/5ML PO SYRP
5.0000 mL | ORAL_SOLUTION | ORAL | Status: DC | PRN
  Filled 2011-12-26: qty 5

## 2011-12-26 MED ORDER — MOXIFLOXACIN HCL IN NACL 400 MG/250ML IV SOLN
400.0000 mg | INTRAVENOUS | Status: DC
  Administered 2011-12-26 – 2011-12-27 (×2): 400 mg via INTRAVENOUS
  Filled 2011-12-26 (×3): qty 250

## 2011-12-26 MED ORDER — FLUTICASONE-SALMETEROL 100-50 MCG/DOSE IN AEPB
1.0000 | INHALATION_SPRAY | Freq: Two times a day (BID) | RESPIRATORY_TRACT | Status: DC
  Administered 2011-12-26 – 2011-12-28 (×4): 1 via RESPIRATORY_TRACT
  Filled 2011-12-26: qty 14

## 2011-12-26 MED ORDER — METOPROLOL SUCCINATE ER 50 MG PO TB24
50.0000 mg | ORAL_TABLET | Freq: Every day | ORAL | Status: DC
  Administered 2011-12-26 – 2011-12-28 (×3): 50 mg via ORAL
  Filled 2011-12-26 (×3): qty 1

## 2011-12-26 MED ORDER — ENOXAPARIN SODIUM 30 MG/0.3ML ~~LOC~~ SOLN
30.0000 mg | SUBCUTANEOUS | Status: DC
  Administered 2011-12-26 – 2011-12-28 (×3): 30 mg via SUBCUTANEOUS
  Filled 2011-12-26 (×3): qty 0.3

## 2011-12-26 MED ORDER — ALFUZOSIN HCL ER 10 MG PO TB24
10.0000 mg | ORAL_TABLET | Freq: Every day | ORAL | Status: DC
Start: 2011-12-26 — End: 2011-12-28
  Administered 2011-12-26 – 2011-12-28 (×3): 10 mg via ORAL
  Filled 2011-12-26 (×3): qty 1

## 2011-12-26 MED ORDER — CHOLECALCIFEROL 10 MCG (400 UNIT) PO TABS
400.0000 [IU] | ORAL_TABLET | Freq: Every day | ORAL | Status: DC
  Administered 2011-12-26 – 2011-12-28 (×3): 400 [IU] via ORAL
  Filled 2011-12-26 (×3): qty 1

## 2011-12-26 MED ORDER — METFORMIN HCL ER 500 MG PO TB24: 500.0000 mg | ORAL_TABLET | Freq: Every day | ORAL | Status: DC

## 2011-12-26 MED ORDER — ONDANSETRON HCL 4 MG/2ML IJ SOLN
INTRAMUSCULAR | Status: AC
  Filled 2011-12-26: qty 2

## 2011-12-26 MED ORDER — SODIUM CHLORIDE 0.9 % IV SOLN
1000.0000 mL | INTRAVENOUS | Status: DC
  Administered 2011-12-26: 1000 mL via INTRAVENOUS

## 2011-12-26 MED ORDER — ACETAMINOPHEN 650 MG RE SUPP: 650.0000 mg | Freq: Four times a day (QID) | RECTAL | Status: DC | PRN

## 2011-12-26 MED ORDER — POTASSIUM CHLORIDE CRYS ER 10 MEQ PO TBCR: 15.0000 meq | EXTENDED_RELEASE_TABLET | Freq: Two times a day (BID) | ORAL | Status: DC

## 2011-12-26 MED ORDER — ALBUTEROL SULFATE (5 MG/ML) 0.5% IN NEBU: 2.5000 mg | INHALATION_SOLUTION | RESPIRATORY_TRACT | Status: DC | PRN

## 2011-12-26 MED ORDER — SODIUM CHLORIDE 0.9 % IV SOLN
INTRAVENOUS | Status: DC
  Administered 2011-12-26: 800 mL via INTRAVENOUS

## 2011-12-26 MED ORDER — POLYVINYL ALCOHOL 1.4 % OP SOLN
1.0000 [drp] | OPHTHALMIC | Status: DC | PRN
  Filled 2011-12-26: qty 15

## 2011-12-26 MED ORDER — ASPIRIN EC 325 MG PO TBEC
325.0000 mg | DELAYED_RELEASE_TABLET | Freq: Every day | ORAL | Status: DC
  Administered 2011-12-26 – 2011-12-28 (×3): 325 mg via ORAL
  Filled 2011-12-26 (×3): qty 1

## 2011-12-26 MED ORDER — SODIUM CHLORIDE 0.9 % IV SOLN
1000.0000 mL | Freq: Once | INTRAVENOUS | Status: AC
  Administered 2011-12-26: 1000 mL via INTRAVENOUS

## 2011-12-26 MED ORDER — ONDANSETRON HCL 4 MG/2ML IJ SOLN
4.0000 mg | Freq: Four times a day (QID) | INTRAMUSCULAR | Status: DC | PRN
  Administered 2011-12-26: 4 mg via INTRAVENOUS

## 2011-12-26 MED ORDER — POLYETHYL GLYCOL-PROPYL GLYCOL 0.4-0.3 % OP SOLN: 1.0000 [drp] | OPHTHALMIC | Status: DC | PRN

## 2011-12-26 MED ORDER — GLIMEPIRIDE 2 MG PO TABS
2.0000 mg | ORAL_TABLET | Freq: Every day | ORAL | Status: DC
  Administered 2011-12-27 – 2011-12-28 (×2): 2 mg via ORAL
  Filled 2011-12-26 (×3): qty 1

## 2011-12-26 MED ORDER — ACETAMINOPHEN 325 MG PO TABS: 650.0000 mg | ORAL_TABLET | Freq: Four times a day (QID) | ORAL | Status: DC | PRN

## 2011-12-26 MED ORDER — POTASSIUM CHLORIDE 20 MEQ/15ML (10%) PO LIQD
15.0000 meq | Freq: Two times a day (BID) | ORAL | Status: DC
  Administered 2011-12-26 – 2011-12-28 (×5): 15 meq via ORAL
  Filled 2011-12-26 (×6): qty 15

## 2011-12-26 NOTE — H&P (Signed)
Patient seen, examined and discussed with my nurse practitioner. Agree with above. Given elevated white blood cell count, essentially normal BNP and history-more consistent with infectious pulmonary process. Have started nebulizers plus IV antibiotics. Also minimize IV fluids prevent worsening diastolic dysfunction in the next few days. Otherwise doing well.

## 2011-12-26 NOTE — ED Notes (Signed)
Pt is here with shortness of breath, cough, and fever.  Pt had temp 101.9 at home and patient feels warm to touch.  Pt states he is nauseated and feels bad..  Pt has known heart blockages and they cannot do a cath because of his kidneys.  Pt denies chest pain.

## 2011-12-26 NOTE — H&P (Signed)
[facility] History and Physical  Roy [name] [id number] DOB: Jan 30, 1933 DOA: 12/26/2011  Referring physician: Monia Pouch PCP: Georgetta Haber, MD  Specialists:   Chief Complaint: SOB/fever/chill  HPI: Roy [name] is a very pleasant  78 y.o. male hx CAD and COPD with DM and HT presents to ED 12/26/11 cc sob/fever. Info obtained from pt and wife who is at bedside. Pt states that 2 days ago he gradually developed slight worsening sob. He states he took mucinex at this time as directed by PCP. States he thought that he was getting bronchitis. He initially felt somewhat better but last evening developed fever, chills, nausea and vomiting. Reports he was up several time during night last evening with dry heaves. Reports decreased po intake during this time and 1 episode diarrhea 2 days ago. Denies chest pain but describes a "tightness" that he has had over last several weeks and for which he sees Dr. Aundra Dubin. Denies headache, dizziness, orthopnea, swelling of LEE.  Other associated symptoms include productive cough and fever 102.  Symptoms came on gradually, have worsened and are characterized as moderate. Nothing makes better and activity makes worse. Work up in ED yields wc 12.2, fever 101 and chest xray concerning for CAP. We are asked to admit.    Review of Systems: The patient denies  weight loss,, vision loss, decreased hearing, hoarseness, chest pain, syncope, peripheral edema, balance deficits, hemoptysis, abdominal pain, melena, hematochezia, severe indigestion/heartburn, hematuria, incontinence, genital sores, muscle weakness, suspicious skin lesions, transient blindness, difficulty walking, depression, unusual weight change, abnormal bleeding, enlarged lymph nodes, angioedema, and breast masses.    Past Medical History  Diagnosis Date  . CAD (coronary artery disease)   . Diabetes mellitus, type 2   . Hyperlipidemia   . Hypertension   . COPD (chronic obstructive pulmonary  disease)     Stage 2   Past Surgical History  Procedure Date  . Cervical laminectomy    Social History:  reports that he quit smoking about 53 years ago. He does not have any smokeless tobacco history on file. He reports that he does not drink alcohol or use illicit drugs. Lives with wife at home. Retired Restaurant manager, fast food. Independent with ADL's  Allergies  Allergen Reactions  . Codeine Sulfate Other (See Comments)    Chest pain  . Cephalexin Rash    Family History  Problem Relation Age of Onset  . ALS Father    Mother died 36 from alzheimer's has 87 siblings who collective PMH includes DM, HTN, prostate cancer, lung cancer, CAD, leukemia and MI  Prior to Admission medications   Medication Sig Start Date End Date Taking? Authorizing Provider  acetaminophen (TYLENOL) 325 MG tablet Take 650 mg by mouth 2 (two) times daily.    Yes Historical Provider, MD  alfuzosin (UROXATRAL) 10 MG 24 hr tablet Take 10 mg by mouth daily.     Yes Historical Provider, MD  aspirin 325 MG EC tablet Take 325 mg by mouth daily.     Yes Historical Provider, MD  atorvastatin (LIPITOR) 10 MG tablet Take 1 tablet (10 mg total) by mouth daily. 04/30/11  Yes Ricard Dillon, MD  betamethasone dipropionate (DIPROLENE) 0.05 % ointment Apply 1 application topically daily. To outside of ear   Yes Historical Provider, MD  Cetirizine HCl (ZYRTEC PO) Take 1 tablet by mouth daily as needed. For allergies   Yes Historical Provider, MD  Cholecalciferol (VITAMIN D PO) Take 1 tablet by mouth 2 (two) times daily.  Yes Historical Provider, MD  Fluticasone-Salmeterol (ADVAIR DISKUS) 100-50 MCG/DOSE AEPB Inhale 1 puff into the lungs 2 (two) times daily.  03/16/11  Yes Ricard Dillon, MD  glimepiride (AMARYL) 2 MG tablet Take 1 tablet (2 mg total) by mouth daily before breakfast. 12/17/11  Yes Ricard Dillon, MD  isosorbide mononitrate (IMDUR) 30 MG 24 hr tablet Take 90 mg by mouth daily.   Yes Historical Provider, MD  metFORMIN  (GLUCOPHAGE-XR) 500 MG 24 hr tablet Take 500 mg by mouth daily with breakfast.   Yes Historical Provider, MD  metoprolol succinate (TOPROL-XL) 50 MG 24 hr tablet Take 1 tablet (50 mg total) by mouth daily. Take with or immediately following a meal. 12/05/11  Yes Larey Dresser, MD  Multiple Vitamins-Minerals (ZINC PO) Take 1 tablet by mouth daily.   Yes Historical Provider, MD  Omega-3 Fatty Acids (FISH OIL PO) Take 2 capsules by mouth 2 (two) times daily.   Yes Historical Provider, MD  OVER THE COUNTER MEDICATION Take 30 mLs by mouth every 4 (four) hours as needed. mucinex fast max  For fever/cold   Yes Historical Provider, MD  Polyethyl Glycol-Propyl Glycol (SYSTANE OP) Place 1 drop into both eyes as needed. Itchy/dry eye   Yes Historical Provider, MD  potassium chloride SA (KLOR-CON M15) 15 MEQ tablet Take 15 mEq by mouth 2 (two) times daily.     Yes Historical Provider, MD  ranitidine (ZANTAC) 300 MG tablet Take 300 mg by mouth 2 (two) times daily.   Yes Historical Provider, MD  ranolazine (RANEXA) 500 MG 12 hr tablet Take 1 tablet (500 mg total) by mouth 2 (two) times daily. 12/10/11  Yes Larey Dresser, MD  traZODone (DESYREL) 150 MG tablet Take 150 mg by mouth at bedtime.   Yes Historical Provider, MD  nitroGLYCERIN (NITROSTAT) 0.4 MG SL tablet Place 1 tablet (0.4 mg total) under the tongue every 5 (five) minutes as needed. 12/06/11   Larey Dresser, MD   Physical Exam: Filed Vitals:   12/26/11 0913 12/26/11 0936 12/26/11 1001 12/26/11 1032  BP: 119/63  128/64 119/71  Pulse: 77  79   Temp:   98.1 F (36.7 C)   TempSrc:   Oral   Resp: 22  23   SpO2: 90% 94% 95%      General:  Awake alert well nourished NAD  Eyes: PERRL EOMI No scleral icterus  ENT: mucus membranes of nose, mouth slightly dry/pink, ears clear  Neck: supple, No JVD full rom  Cardiovascular: RRR No MGR NO LEE PPP  Respiratory: normal effort. BS slightly distant but clear. No wheeze/rhonchi/crackles  Abdomen:  round soft +BS non-tender to palpation.  Skin: warm and dry, no rash/lesion  Musculoskeletal: MOE no joint swelling/erythema. No tenderness to palpation  Psychiatric: appropriate, cooperative  Neurologic: oriented x3. Cranial nerve II-XII intact. Speech clear  Labs on Admission:  Basic Metabolic Panel:  Lab [id number] 0812  NA 135  K 3.9  CL 96  CO2 27  GLUCOSE 243*  BUN 24*  CREATININE 1.73*  CALCIUM 9.5  MG --  PHOS --   Liver Function Tests: No results found for this basename: AST:5,ALT:5,ALKPHOS:5,BILITOT:5,PROT:5,ALBUMIN:5 in the last 168 hours No results found for this basename: LIPASE:5,AMYLASE:5 in the last 168 hours No results found for this basename: AMMONIA:5 in the last 168 hours CBC:  Lab 12/26/11 0812  WBC 12.2*  NEUTROABS 10.7*  HGB 13.4  HCT 38.9*  MCV 84.0  PLT PLATELET CLUMPS NOTED ON SMEAR,  COUNT APPEARS DECREASED   Cardiac Enzymes: No results found for this basename: CKTOTAL:5,CKMB:5,CKMBINDEX:5,TROPONINI:5 in the last 168 hours  BNP (last 3 results)  Basename 12/10/11 0916 10/31/11 1025  PROBNP 53.0 38.0   CBG: No results found for this basename: GLUCAP:5 in the last 168 hours  Radiological Exams on Admission: Dg Chest 2 View  12/26/2011  *RADIOLOGY REPORT*  Clinical Data: Short of breath.  Fever  CHEST - 2 VIEW  Comparison: 05/19/2007  Findings: Heart size upper normal.  Negative for heart failure. Multiple chronic rib fractures on the right.  Mild left lower lobe airspace disease could represent pneumonia. No significant effusion.  Prominent lung markings suggesting chronic lung disease.  IMPRESSION: Mild left lower lobe airspace disease which could represent pneumonia or bronchitis.  COPD.   Original Report Authenticated By: Truett Perna, M.D.     EKG: Independently reviewed.   Assessment/Plan Principal Problem:  *CAP (community acquired pneumonia): will admit to tele. Will gently hydrate with IV fluids, check sputum culture and  continue levaquin (pt allergies) per pharmacy. Provide )2 support as needed as well as cough medicine. Currently afebrile and non-toxic appearing. Will monitor closely. Check CBC in am. Active Problems:  DIABETES MELLITUS, TYPE II: A1c 6.7 in august. CBG 243 in ED. Likely related to #1. Will continue home meds and use SSI as needed  HYPERLIPIDEMIA: continue home meds  PARKINSON'S DISEASE  HYPERTENSION: stable. SBP range 119-128. Continue home meds  CORONARY ARTERY DISEASE: has appointment with Dr Aundra Dubin tomorrow. i have called office to cancel. Currently working up stable exertional chest pain and dyspnea according to note. Recent echo with EF 60% and grade 1 diastolic dysfunction. No s/sx of overload. Description of symptoms such that could be related. Will check ekg and troponin.continue home meds. No consult at this time as feel most symptoms related to #1, but given hx may consider if no improvement.   CHRONIC KIDNEY DISEASE STAGE II (MILD): creatinine appears at baseline. Gently IV hydration. BMET in am.  SLEEP APNEA: will request CPAP  COPD with chronic bronchitis: no wheeze. See #1 and #6. Continue home inhalers    Code Status: full Family Communication: wife at bedside Disposition Plan: home when stable  Time spent: 30 minutes  [facility]   If 7PM-7AM, please contact night-coverage [email or web address] Password [email or web address] 12/26/2011, 10:58 AM

## 2011-12-26 NOTE — ED Provider Notes (Signed)
History     CSN: [id number]  Arrival date & time 12/26/11  [id number]   First MD Initiated Contact with Patient 12/26/11 [phone]      Chief Complaint  Patient presents with  . Shortness of Breath  . Fever    (Consider location/radiation/quality/duration/timing/severity/associated sxs/prior treatment) HPI Comments: A 78 year old man sick since Monday, 2 days ago. He felt as though he had bronchitis. He took Mucinex without relief. He has been coughing up thick phlegm that is dark brown in color. He felt worse last night. His temperature this morning at 5 AM was 101. He has a prior history of pneumonia. Multiple other comorbidities. He has coronary artery disease and has had prior angioplasty. He is diabetic, has renal insufficiency, hypertension, and high cholesterol. He has had [facility] spotted fever and Lyme disease in the past also.  Patient is a 78 y.o. male presenting with shortness of breath and fever. The history is provided by the patient, the spouse and medical records. No language interpreter was used.  Shortness of Breath  The current episode started 2 days ago. The onset was gradual. The problem occurs frequently. The problem has been gradually worsening. The problem is moderate. Nothing relieves the symptoms. Nothing aggravates the symptoms. Associated symptoms include a fever, cough and shortness of breath. He has had prior hospitalizations. He has had no prior ICU admissions. He has had no prior intubations. Past medical history comments:  prior  episodes of pneumonia. . There were no sick contacts. Recently, medical care has been given by a specialist (He is followed for coronary artery disease by [name], his cardiologist.).  Fever Primary symptoms of the febrile illness include fever, cough and shortness of breath.    Past Medical History  Diagnosis Date  . CAD (coronary artery disease)   . Diabetes mellitus, type 2   . Hyperlipidemia   . Hypertension   . COPD  (chronic obstructive pulmonary disease)     Stage 2    Past Surgical History  Procedure Date  . Cervical laminectomy     Family History  Problem Relation Age of Onset  . ALS Father     History  Substance Use Topics  . Smoking status: Former Smoker    Quit date: 03/05/1958  . Smokeless tobacco: Not on file  . Alcohol Use: No      Review of Systems  Constitutional: Positive for fever and chills.  HENT: Negative.   Eyes: Negative.   Respiratory: Positive for cough and shortness of breath.   Cardiovascular: Negative.   Gastrointestinal: Negative.   Genitourinary: Negative.   Musculoskeletal: Negative.   Skin: Negative.   Neurological: Negative.   Psychiatric/Behavioral: Negative.     Allergies  Cephalexin and Codeine sulfate  Home Medications   Current Outpatient Rx  Name Route Sig Dispense Refill  . ACETAMINOPHEN 325 MG PO TABS Oral Take 650 mg by mouth every 6 (six) hours as needed. Taken every 6 hours as needed with Tramadol.     [name] ALFUZOSIN HCL ER 10 MG PO TB24 Oral Take 10 mg by mouth daily.      . ASPIRIN 325 MG PO TBEC Oral Take 325 mg by mouth daily.      . ATORVASTATIN CALCIUM 10 MG PO TABS Oral Take 1 tablet (10 mg total) by mouth daily. 30 tablet 11  . BETAMETHASONE DIPROPIONATE 0.05 % EX OINT  APPLY EVERY DAY AS DIRECTED 30 g 3  . FLUTICASONE-SALMETEROL 100-50 MCG/DOSE IN AEPB  1 puff 2 (two) times daily.     [name] GLIMEPIRIDE 2 MG PO TABS Oral Take 1 tablet (2 mg total) by mouth daily before breakfast. 30 tablet 11  . ISOSORBIDE MONONITRATE ER 30 MG PO TB24  TAKE THREE TABLETS BY MOUTH EVERY DAY 90 tablet 3  . METFORMIN HCL ER 500 MG PO TB24  TAKE ONE TABLET BY MOUTH EVERY DAY 30 tablet 11  . METOPROLOL SUCCINATE ER 50 MG PO TB24 Oral Take 1 tablet (50 mg total) by mouth daily. Take with or immediately following a meal. 90 tablet 3  . NITROGLYCERIN 0.4 MG SL SUBL Sublingual Place 1 tablet (0.4 mg total) under the tongue every 5 (five) minutes as needed. 25  tablet 3  . POTASSIUM CHLORIDE CRYS ER 15 MEQ PO TBCR Oral Take 15 mEq by mouth 2 (two) times daily.      [name] RANITIDINE HCL 300 MG PO TABS  TAKE ONE TABLET BY MOUTH TWICE DAILY 180 tablet 3  . RANOLAZINE ER 500 MG PO TB12 Oral Take 1 tablet (500 mg total) by mouth 2 (two) times daily. 60 tablet 6  . TRAMADOL HCL 50 MG PO TABS  TAKE 1 TABLET WITH TYLENOL 325MG  EVERY 6 HOURS AS NEEDED FOR PAIN (THIS IS SUBSTITUTE FOR DARVOCET-N) 30 tablet 6  . TRAZODONE HCL 150 MG PO TABS  TAKE ONE TABLET BY MOUTH EVERY DAY 30 tablet 2    BP 138/65  Temp 102 F (38.9 C) (Rectal)  Resp 22  SpO2 90%  Physical Exam  Nursing note and vitals reviewed. Constitutional: He is oriented to person, place, and time. He appears well-developed and well-nourished. Distressed: In mild to moderate distress, complaining of cough.       T 102 rectally.  HENT:  Head: Normocephalic and atraumatic.  Right Ear: External ear normal.  Left Ear: External ear normal.       Oropharynx dry.  Eyes: Conjunctivae normal and EOM are normal. Pupils are equal, round, and reactive to light.  Neck: Normal range of motion. Neck supple.  Cardiovascular: Normal rate and regular rhythm.   Pulmonary/Chest: Effort normal.       Rhonchi heard at both bases posteriorly.  Abdominal: Soft. Bowel sounds are normal. He exhibits no distension. There is no tenderness.  Musculoskeletal: Normal range of motion. He exhibits no edema and no tenderness.  Neurological: He is alert and oriented to person, place, and time.       No sensory or motor deficit.  Skin: Skin is warm and dry. No rash noted.  Psychiatric: He has a normal mood and affect. His behavior is normal.    ED Course  Procedures (including critical care time)   Labs Reviewed  CBC WITH DIFFERENTIAL  CULTURE, BLOOD (ROUTINE X 2)  CULTURE, BLOOD (ROUTINE X 2)  BASIC METABOLIC PANEL  LACTIC ACID, PLASMA    Date: 12/26/2011  Rate: 82  Rhythm: normal sinus rhythm  QRS Axis: normal   Intervals: normal  ST/T Wave abnormalities: ST depressions inferiorly and ST depressions laterally  Conduction Disutrbances:none  Narrative Interpretation: Abnormal EKG  Old EKG Reviewed: unchanged  8:23 AM Pt was seen and had physical examination.  Lab workup ordered.  IV fluids, supplemental oxygen ordered.  PO Tylenol ordered.  9:29 AM Results for orders placed during the hospital encounter of 12/26/11  CBC WITH DIFFERENTIAL      Component Value Range   WBC 12.2 (*) 4.0 - 10.5 K/uL   RBC 4.63  4.22 -  5.81 MIL/uL   Hemoglobin 13.4  13.0 - 17.0 g/dL   HCT 38.9 (*) 39.0 - 52.0 %   MCV 84.0  78.0 - 100.0 fL   MCH 28.9  26.0 - 34.0 pg   MCHC 34.4  30.0 - 36.0 g/dL   RDW 13.7  11.5 - 15.5 %   Platelets PENDING  150 - 400 K/uL   Neutrophils Relative 88 (*) 43 - 77 %   Neutro Abs 10.7 (*) 1.7 - 7.7 K/uL   Lymphocytes Relative 4 (*) 12 - 46 %   Lymphs Abs 0.5 (*) 0.7 - 4.0 K/uL   Monocytes Relative 7  3 - 12 %   Monocytes Absolute 0.9  0.1 - 1.0 K/uL   Eosinophils Relative 1  0 - 5 %   Eosinophils Absolute 0.1  0.0 - 0.7 K/uL   Basophils Relative 0  0 - 1 %   Basophils Absolute 0.0  0.0 - 0.1 K/uL  BASIC METABOLIC PANEL      Component Value Range   Sodium 135  135 - 145 mEq/L   Potassium 3.9  3.5 - 5.1 mEq/L   Chloride 96  96 - 112 mEq/L   CO2 27  19 - 32 mEq/L   Glucose, Bld 243 (*) 70 - 99 mg/dL   BUN 24 (*) 6 - 23 mg/dL   Creatinine, Ser 1.73 (*) 0.50 - 1.35 mg/dL   Calcium 9.5  8.4 - 10.5 mg/dL   GFR calc non Af Amer 36 (*) >90 mL/min   GFR calc Af Amer 42 (*) >90 mL/min  LACTIC ACID, PLASMA      Component Value Range   Lactic Acid, Venous 1.0  0.5 - 2.2 mmol/L  POCT I-STAT 3, BLOOD GAS (G3+)      Component Value Range   pH, Arterial 7.433  7.350 - 7.450   pCO2 arterial 38.0  35.0 - 45.0 mmHg   pO2, Arterial 76.0 (*) 80.0 - 100.0 mmHg   Bicarbonate 25.4 (*) 20.0 - 24.0 mEq/L   TCO2 27  0 - 100 mmol/L   O2 Saturation 96.0     Acid-Base Excess 1.0  0.0 - 2.0 mmol/L     Collection site RADIAL, ALLEN'S TEST ACCEPTABLE     Drawn by Operator     Sample type ARTERIAL     Dg Chest 2 View  12/26/2011  *RADIOLOGY REPORT*  Clinical Data: Short of breath.  Fever  CHEST - 2 VIEW  Comparison: 05/19/2007  Findings: Heart size upper normal.  Negative for heart failure. Multiple chronic rib fractures on the right.  Mild left lower lobe airspace disease could represent pneumonia. No significant effusion.  Prominent lung markings suggesting chronic lung disease.  IMPRESSION: Mild left lower lobe airspace disease which could represent pneumonia or bronchitis.  COPD.   Original Report Authenticated By: Truett Perna, M.D.     Chest x-ray shows LLL infiltrate.  Will treat for CAP, request [facility] to admit him.  10:02 AM Case discussed with Dyanne Carrel, RNP.  Admit to [facility], to a telemetry bed.  1. CAP (community acquired pneumonia)          Mylinda Latina III, MD 12/26/11 1003

## 2011-12-26 NOTE — ED Notes (Signed)
Pt has dry oral mucous membranes

## 2011-12-26 NOTE — Progress Notes (Signed)
PHARMACIST - PHYSICIAN COMMUNICATION DR:  [name] CONCERNING:  METFORMIN SAFE ADMINISTRATION POLICY  RECOMMENDATION: Metformin has been placed on DISCONTINUE (rejected order) STATUS and should be reordered only after any of the conditions below are ruled out.  Current safety recommendations include avoiding metformin for a minimum of 48 hours after the patient's exposure to intravenous contrast media.  DESCRIPTION:  The Pharmacy Committee has adopted a policy that restricts the use of metformin in hospitalized patients until all the contraindications to administration have been ruled out. Specific contraindications are: [x]  Serum creatinine ? 1.5 for males []  Serum creatinine ? 1.4 for females []  Shock, acute MI, sepsis, hypoxemia, dehydration []  Planned administration of intravenous iodinated contrast media []  Heart Failure patients with low EF []  Acute or chronic metabolic acidosis (including DKA)   PLAN:   Amaryl and Sliding scale insulin already ordered.   CKD, serum creatinine has been 1.8-2.0.   Needs to stay off Metformin with his renal funtion.  Roy [name], RPh Pager: [phone] 12/26/2011 11:45am

## 2011-12-26 NOTE — Progress Notes (Signed)
Roy [name] [id number] Admitted to [facility]: 12/26/2011 12:21 PM Attending Provider: Annita Brod, MD    Roy [name] is a 78 y.o. male patient admitted from ED awake, alert  & orientated  X 3,  No Order, VSS - Blood pressure 152/76, pulse 81, temperature 98.5 F (36.9 C), temperature source Oral, resp. rate 20, height 5\' 9"  (1.753 m), weight 91.2 kg (201 lb 1 oz), SpO2 97.00%., O2    2 L nasal cannular, no c/o shortness of breath, no c/o chest pain, no distress noted. Tele # [id number] placed and pt is currently running:normal sinus rhythm.   IV site WDL:  antecubital left, condition patent and no redness with a transparent dsg that's clean dry and intact.  Allergies:   Allergies  Allergen Reactions  . Codeine Sulfate Other (See Comments)    Chest pain  . Cephalexin Rash     Past Medical History  Diagnosis Date  . CAD (coronary artery disease)   . Hyperlipidemia   . Hypertension   . COPD (chronic obstructive pulmonary disease)     Stage 2  . Myocardial infarction   . Anginal pain   . Shortness of breath   . Lyme disease   . Sleep apnea     uses cpap  . Diabetes mellitus, type 2   . Heart murmur   . Fever 12/26/2011  . Chronic kidney disease     renal insufficiency  . GERD (gastroesophageal reflux disease)   . Arthritis     spine      Pt orientation to unit, room and routine. Information packet given to patient/family and safety video watched.  Admission INP armband ID verified with patient/family, and in place. SR up x 2, fall risk assessment complete with Patient and family verbalizing understanding of risks associated with falls. Pt verbalizes an understanding of how to use the call bell and to call for help before getting out of bed.  Skin, clean-dry- intact without evidence of bruising, or skin tears.   No evidence of skin break down noted on exam.    Will cont to monitor and assist as needed.  Lindalou Hose, RN 12/26/2011 12:21 PM

## 2011-12-27 ENCOUNTER — Ambulatory Visit: Payer: Medicare Other | Admitting: Cardiology

## 2011-12-27 LAB — LEGIONELLA ANTIGEN, URINE: Legionella Antigen, Urine: NEGATIVE

## 2011-12-27 LAB — BASIC METABOLIC PANEL
GFR calc Af Amer: 37 mL/min — ABNORMAL LOW (ref >90)
GFR calc non Af Amer: 32 mL/min — ABNORMAL LOW (ref >90)
Glucose, Bld: 150 mg/dL — ABNORMAL HIGH (ref 70–99)
Potassium: 3.9 mEq/L (ref 3.5–5.1)
Sodium: 139 mEq/L (ref 135–145)

## 2011-12-27 LAB — CBC
Hemoglobin: 12.2 g/dL — ABNORMAL LOW (ref 13.0–17.0)
RBC: 4.24 MIL/uL (ref 4.22–5.81)
WBC: 10.4 10*3/uL (ref 4.0–10.5)

## 2011-12-27 LAB — GLUCOSE, CAPILLARY: Glucose-Capillary: 164 mg/dL — ABNORMAL HIGH (ref 70–99)

## 2011-12-27 MED ORDER — IPRATROPIUM BROMIDE 0.02 % IN SOLN
0.5000 mg | Freq: Four times a day (QID) | RESPIRATORY_TRACT | Status: DC
  Administered 2011-12-27 – 2011-12-28 (×4): 0.5 mg via RESPIRATORY_TRACT
  Filled 2011-12-27 (×5): qty 2.5

## 2011-12-27 NOTE — Progress Notes (Signed)
PATIENT DETAILS Name: Roy [name] Age: 78 y.o. Sex: male Date of Birth: 10-Jun-1932 Admit Date: 12/26/2011 Admitting Physician Annita Brod, MD [id number] Percell Miller, MD  Subjective: Feels better this am  Assessment/Plan: Principal Problem:  *CAP (community acquired pneumonia) -Febrile on admission, but no fever since -Leukocytosis now normalized -c/w Avelox -repeat CXR in 6-8 weeks to document clearance of the infiltrate-patient a prior smoker  Active Problems: COPD with mild exacerbation -some wheezing present this am on exam -c/w Albuterol nebs -add Atrovent nebs -Flutter valve and Incentive Spirometry -hold off on starting steroids at present -attempt to taper off O2   DIABETES MELLITUS, TYPE II -CBG's stable -c/w SSI -stop Metformin-has CKD -continue with Amaryl -check A1C-if low-can just be on a single agent-i.e Amaryl   HYPERLIPIDEMIA -c/w Lipitor  CAD -stable -troponins negative -c/w ASA, Metoprolol and Statins -recent  Nuclear stress test on 10/3-neg   HYPERTENSION -controlled with Imdur and Metoprolol   CHRONIC KIDNEY DISEASE STAGE III -stable -at baseline  Disposition: Remain inpatient  DVT Prophylaxis: Prophylactic Lovenox  Code Status: Full code   Procedures:  None  CONSULTS:  None  PHYSICAL EXAM: Vital signs in last 24 hours: Filed Vitals:   12/26/11 2104 12/27/11 0441 12/27/11 0811 12/27/11 1033  BP: 103/57 134/75  131/61  Pulse: 81 67  80  Temp: 99.8 F (37.7 C) 98.3 F (36.8 C)    TempSrc: Oral Oral    Resp: 20 20    Height:      Weight:      SpO2: 92%  99%     Weight change:  Body mass index is 29.69 kg/(m^2).   Gen Exam: Awake and alert with clear speech.   Neck: Supple, No JVD.   Chest: B/L Clear except for some mild scattered rhonchi.   CVS: S1 S2 Regular, no murmurs.  Abdomen: soft, BS +, non tender, non distended.  Extremities: no edema, lower extremities warm to touch. Neurologic: Non Focal.     Skin: No Rash.   Wounds: N/A.    Intake/Output from previous day:  Intake/Output Summary (Last 24 hours) at 12/27/11 1042 Last data filed at 12/27/11 0900  Gross per 24 hour  Intake    360 ml  Output      0 ml  Net    360 ml     LAB RESULTS: CBC  Lab 12/27/11 0515 12/26/11 0812  WBC 10.4 12.2*  HGB 12.2* 13.4  HCT 36.3* 38.9*  PLT 99* PLATELET CLUMPS NOTED ON SMEAR, COUNT APPEARS DECREASED  MCV 85.6 84.0  MCH 28.8 28.9  MCHC 33.6 34.4  RDW 13.7 13.7  LYMPHSABS -- 0.5*  MONOABS -- 0.9  EOSABS -- 0.1  BASOSABS -- 0.0  BANDABS -- --    Chemistries   Lab 12/27/11 0515 12/26/11 0812  NA 139 135  K 3.9 3.9  CL 100 96  CO2 28 27  GLUCOSE 150* 243*  BUN 24* 24*  CREATININE 1.94* 1.73*  CALCIUM 9.0 9.5  MG -- --    CBG:  Lab 12/27/11 0750 12/26/11 1728 12/26/11 1147  GLUCAP 164* 153* 209*    GFR Estimated Creatinine Clearance: 35.6 ml/min (by C-G formula based on Cr of 1.94).  Coagulation profile No results found for this basename: INR:5,PROTIME:5 in the last 168 hours  Cardiac Enzymes  Lab 12/26/11 2233 12/26/11 1712 12/26/11 1135  CKMB -- -- --  TROPONINI <0.30 <0.30 <0.30  MYOGLOBIN -- -- --    No components found with this  basename: POCBNP:3 No results found for this basename: DDIMER:2 in the last 72 hours No results found for this basename: HGBA1C:2 in the last 72 hours No results found for this basename: CHOL:2,HDL:2,LDLCALC:2,TRIG:2,CHOLHDL:2,LDLDIRECT:2 in the last 72 hours No results found for this basename: TSH,T4TOTAL,FREET3,T3FREE,THYROIDAB in the last 72 hours No results found for this basename: VITAMINB12:2,FOLATE:2,FERRITIN:2,TIBC:2,IRON:2,RETICCTPCT:2 in the last 72 hours No results found for this basename: LIPASE:2,AMYLASE:2 in the last 72 hours  Urine Studies No results found for this basename: UACOL:2,UAPR:2,USPG:2,UPH:2,UTP:2,UGL:2,UKET:2,UBIL:2,UHGB:2,UNIT:2,UROB:2,ULEU:2,UEPI:2,UWBC:2,URBC:2,UBAC:2,CAST:2,CRYS:2,UCOM:2,BILUA:2  in the last 72 hours  MICROBIOLOGY: Recent Results (from the past 240 hour(s))  CULTURE, BLOOD (ROUTINE X 2)     Status: Normal (Preliminary result)   Collection Time   12/26/11  8:25 AM      Component Value Range Status Comment   Specimen Description BLOOD LEFT HAND   Final    Special Requests BOTTLES DRAWN AEROBIC AND ANAEROBIC 10CC   Final    Culture  Setup Time     Final    Value: 12/26/2011 13:56  DEMOGRAPHIC UPDATE OCCURRED ON 10/24 AT 0831, QA FLAGS AND RANGES MAY NO LONGER BE VALID   Culture     Final    Value:        BLOOD CULTURE RECEIVED NO GROWTH TO DATE CULTURE WILL BE HELD FOR 5 DAYS BEFORE ISSUING A FINAL NEGATIVE REPORT DEMOGRAPHIC UPDATE OCCURRED ON 10/24 AT 0831, QA FLAGS AND RANGES MAY NO LONGER BE VALID   Report Status PENDING   Incomplete   CULTURE, BLOOD (ROUTINE X 2)     Status: Normal (Preliminary result)   Collection Time   12/26/11  8:26 AM      Component Value Range Status Comment   Specimen Description BLOOD RIGHT ARM   Final    Special Requests BOTTLES DRAWN AEROBIC AND ANAEROBIC 10CC   Final    Culture  Setup Time     Final    Value: 12/26/2011 13:56  DEMOGRAPHIC UPDATE OCCURRED ON 10/24 AT 0831, QA FLAGS AND RANGES MAY NO LONGER BE VALID   Culture     Final    Value:        BLOOD CULTURE RECEIVED NO GROWTH TO DATE CULTURE WILL BE HELD FOR 5 DAYS BEFORE ISSUING A FINAL NEGATIVE REPORT DEMOGRAPHIC UPDATE OCCURRED ON 10/24 AT 0831, QA FLAGS AND RANGES MAY NO LONGER BE VALID   Report Status PENDING   Incomplete   CULTURE, EXPECTORATED SPUTUM-ASSESSMENT     Status: Normal   Collection Time   12/26/11  3:23 PM      Component Value Range Status Comment   Specimen Description SPUTUM   Final    Special Requests NONE   Final    Sputum evaluation     Final    Value: THIS SPECIMEN IS ACCEPTABLE. RESPIRATORY CULTURE REPORT TO FOLLOW.   Report Status 12/26/2011 FINAL   Final   CULTURE, RESPIRATORY     Status: Normal (Preliminary result)   Collection Time    12/26/11  3:23 PM      Component Value Range Status Comment   Specimen Description SPUTUM   Final    Special Requests NONE   Final    Gram Stain     Final    Value: ABUNDANT WBC PRESENT,BOTH PMN AND MONONUCLEAR     FEW SQUAMOUS EPITHELIAL CELLS PRESENT     ABUNDANT GRAM POSITIVE COCCI     IN PAIRS IN CHAINS IN CLUSTERS   Culture Culture reincubated for better growth   Final  Report Status PENDING   Incomplete     RADIOLOGY STUDIES/RESULTS: Dg Chest 2 View  12/26/2011  *RADIOLOGY REPORT*  Clinical Data: Short of breath.  Fever  CHEST - 2 VIEW  Comparison: 05/19/2007  Findings: Heart size upper normal.  Negative for heart failure. Multiple chronic rib fractures on the right.  Mild left lower lobe airspace disease could represent pneumonia. No significant effusion.  Prominent lung markings suggesting chronic lung disease.  IMPRESSION: Mild left lower lobe airspace disease which could represent pneumonia or bronchitis.  COPD.   Original Report Authenticated By: Truett Perna, M.D.     MEDICATIONS: Scheduled Meds:   . acetaminophen  650 mg Oral BID  . albuterol  2.5 mg Nebulization QID  . alfuzosin  10 mg Oral Daily  . aspirin  325 mg Oral Daily  . atorvastatin  10 mg Oral q1800  . cholecalciferol  400 Units Oral Daily  . enoxaparin  30 mg Subcutaneous Q24H  . famotidine  10 mg Oral BID  . Fluticasone-Salmeterol  1 puff Inhalation BID  . glimepiride  2 mg Oral QAC breakfast  . insulin aspart  0-15 Units Subcutaneous TID WC  . ipratropium  0.5 mg Nebulization QID  . isosorbide mononitrate  90 mg Oral Daily  . metoprolol succinate  50 mg Oral Daily  . moxifloxacin  400 mg Intravenous Q24H  . ondansetron      . potassium chloride  15 mEq Oral BID  . ranolazine  500 mg Oral BID  . traZODone  150 mg Oral QHS  . DISCONTD: levofloxacin (LEVAQUIN) IV  750 mg Intravenous Q48H  . DISCONTD: levofloxacin (LEVAQUIN) IV  750 mg Intravenous Q24H  . DISCONTD: metFORMIN  500 mg Oral Q  breakfast  . DISCONTD: potassium chloride SA  15 mEq Oral BID   Continuous Infusions:   . sodium chloride 500 mL (12/26/11 1517)  . DISCONTD: sodium chloride 1,000 mL (12/26/11 0913)   PRN Meds:.acetaminophen, acetaminophen, albuterol, guaiFENesin-dextromethorphan, nitroGLYCERIN, ondansetron (ZOFRAN) IV, polyvinyl alcohol, DISCONTD: Polyethyl Glycol-Propyl Glycol  Antibiotics: Anti-infectives     Start     Dose/Rate Route Frequency Ordered Stop   12/28/11 1015   levofloxacin (LEVAQUIN) IVPB 750 mg  Status:  Discontinued        750 mg 100 mL/hr over 90 Minutes Intravenous Every 48 hours 12/26/11 1023 12/26/11 1536   12/26/11 1545   moxifloxacin (AVELOX) IVPB 400 mg        400 mg 250 mL/hr over 60 Minutes Intravenous Every 24 hours 12/26/11 1536     12/26/11 1130   levofloxacin (LEVAQUIN) IVPB 750 mg  Status:  Discontinued        750 mg 100 mL/hr over 90 Minutes Intravenous Every 24 hours 12/26/11 1116 12/26/11 1140   12/26/11 1015   levofloxacin (LEVAQUIN) IVPB 750 mg  Status:  Discontinued        750 mg 100 mL/hr over 90 Minutes Intravenous Every 24 hours 12/26/11 1008 12/26/11 [place], MD  [facility] Pager:[phone]  If 7PM-7AM, please contact night-coverage [email or web address] Password [email or web address] 12/27/2011, 10:42 AM   LOS: 1 day

## 2011-12-28 DIAGNOSIS — I10 Essential (primary) hypertension: Secondary | ICD-10-CM

## 2011-12-28 LAB — CBC
HCT: 33 % — ABNORMAL LOW (ref 39.0–52.0)
MCV: 84.2 fL (ref 78.0–100.0)
RBC: 3.92 MIL/uL — ABNORMAL LOW (ref 4.22–5.81)
WBC: 8.5 10*3/uL (ref 4.0–10.5)

## 2011-12-28 LAB — GLUCOSE, CAPILLARY
Glucose-Capillary: 160 mg/dL — ABNORMAL HIGH (ref 70–99)
Glucose-Capillary: 207 mg/dL — ABNORMAL HIGH (ref 70–99)

## 2011-12-28 MED ORDER — GUAIFENESIN-DM 100-10 MG/5ML PO SYRP: 5.0000 mL | ORAL_SOLUTION | ORAL | Status: DC | PRN

## 2011-12-28 MED ORDER — MOXIFLOXACIN HCL 400 MG PO TABS
400.0000 mg | ORAL_TABLET | Freq: Every day | ORAL | Status: DC
  Filled 2011-12-28: qty 1

## 2011-12-28 MED ORDER — ALBUTEROL SULFATE (5 MG/ML) 0.5% IN NEBU: 2.5000 mg | INHALATION_SOLUTION | Freq: Four times a day (QID) | RESPIRATORY_TRACT | Status: DC | PRN

## 2011-12-28 MED ORDER — MOXIFLOXACIN HCL 400 MG PO TABS: 400.0000 mg | ORAL_TABLET | Freq: Every day | ORAL | Status: DC

## 2011-12-28 NOTE — Progress Notes (Signed)
Patient discharge home. Patient with no compliants. No needs or concerns. Patient verbalized understanding as well wife. Skin intact. Prescription given.

## 2011-12-28 NOTE — Care Management Note (Signed)
    Page 1 of 1   12/28/2011     10:20:10 AM   CARE MANAGEMENT NOTE 12/28/2011  Patient:  DENIM, HOAGE R   Account Number:  [id number]  Date Initiated:  12/26/2011  Documentation initiated by:  Tomi Bamberger  Subjective/Objective Assessment:   dx pna  admit- lives with spouse.  pta independent.     Action/Plan:   Anticipated DC Date:  12/28/2011   Anticipated DC Plan:  [place]  CM consult      Choice offered to / List presented to:             Status of service:  Completed, signed off Medicare Important Message given?   (If response is "NO", the following Medicare IM given date fields will be blank) Date Medicare IM given:   Date Additional Medicare IM given:    Discharge Disposition:  HOME/SELF CARE  Per UR Regulation:  Reviewed for med. necessity/level of care/duration of stay  If discussed at [place] of Stay Meetings, dates discussed:    Comments:  12/28/11 10:16 Tomi Bamberger RN, BSN [phone] patient for dc today, patient states his daughter has a nebulizer machine and he will be using hers and she knows how to use it.  MD will need to give patient scripts for meds for nebulizer.  12/26/11 12:25 Tomi Bamberger RN, BSN [phone] patient lives with spouse, NCM will contiue to follow for dc needs.

## 2011-12-28 NOTE — Discharge Summary (Signed)
PATIENT DETAILS Name: Roy [name] Age: 78 y.o. Sex: male Date of Birth: 12-02-1932 MRN: [id number]. Admit Date: 12/26/2011 Admitting Physician: Annita Brod, MD [id number] EDWARD, MD  Recommendations for Outpatient Follow-up:  Repeat chest x-ray in 6-8 weeks to document clearance of the consolidation  PRIMARY DISCHARGE DIAGNOSIS:  Principal Problem:  *CAP (community acquired pneumonia) Active Problems:  DIABETES MELLITUS, TYPE II  HYPERLIPIDEMIA  PARKINSON'S DISEASE  HYPERTENSION  CORONARY ARTERY DISEASE  CHRONIC KIDNEY DISEASE STAGE II (MILD)  SLEEP APNEA  COPD with chronic bronchitis      PAST MEDICAL HISTORY: Past Medical History  Diagnosis Date  . CAD (coronary artery disease)   . Hyperlipidemia   . Hypertension   . COPD (chronic obstructive pulmonary disease)     Stage 2  . Myocardial infarction   . Anginal pain   . Shortness of breath   . Lyme disease   . Sleep apnea     uses cpap  . Diabetes mellitus, type 2   . Heart murmur   . Fever 12/26/2011  . Chronic kidney disease     renal insufficiency  . GERD (gastroesophageal reflux disease)   . Arthritis     spine    DISCHARGE MEDICATIONS:   Medication List     As of 12/28/2011 10:57 AM    STOP taking these medications         metFORMIN 500 MG 24 hr tablet   Commonly known as: GLUCOPHAGE-XR      TAKE these medications         acetaminophen 325 MG tablet   Commonly known as: TYLENOL   Take 650 mg by mouth 2 (two) times daily.      ADVAIR DISKUS 100-50 MCG/DOSE Aepb   Generic drug: Fluticasone-Salmeterol   Inhale 1 puff into the lungs 2 (two) times daily.      albuterol (5 MG/ML) 0.5% nebulizer solution   Commonly known as: PROVENTIL   Take 0.5 mLs (2.5 mg total) by nebulization every 6 (six) hours as needed for wheezing.      alfuzosin 10 MG 24 hr tablet   Commonly known as: UROXATRAL   Take 10 mg by mouth daily.      aspirin 325 MG EC tablet   Take 325 mg by mouth daily.        atorvastatin 10 MG tablet   Commonly known as: LIPITOR   Take 1 tablet (10 mg total) by mouth daily.      betamethasone dipropionate 0.05 % ointment   Commonly known as: DIPROLENE   Apply 1 application topically daily. To outside of ear      FISH OIL PO   Take 2 capsules by mouth 2 (two) times daily.      glimepiride 2 MG tablet   Commonly known as: AMARYL   Take 1 tablet (2 mg total) by mouth daily before breakfast.      guaiFENesin-dextromethorphan 100-10 MG/5ML syrup   Commonly known as: ROBITUSSIN DM   Take 5 mLs by mouth every 4 (four) hours as needed for cough.      isosorbide mononitrate 30 MG 24 hr tablet   Commonly known as: IMDUR   Take 90 mg by mouth daily.      metoprolol succinate 50 MG 24 hr tablet   Commonly known as: TOPROL-XL   Take 1 tablet (50 mg total) by mouth daily. Take with or immediately following a meal.      moxifloxacin 400 MG tablet  Commonly known as: AVELOX   Take 1 tablet (400 mg total) by mouth daily at 8 pm.      nitroGLYCERIN 0.4 MG SL tablet   Commonly known as: NITROSTAT   Place 1 tablet (0.4 mg total) under the tongue every 5 (five) minutes as needed.      OVER THE COUNTER MEDICATION   Take 30 mLs by mouth every 4 (four) hours as needed. mucinex fast max    For fever/cold      potassium chloride SA 15 MEQ tablet   Commonly known as: KLOR-CON M15   Take 15 mEq by mouth 2 (two) times daily.      ranitidine 300 MG tablet   Commonly known as: ZANTAC   Take 300 mg by mouth 2 (two) times daily.      ranolazine 500 MG 12 hr tablet   Commonly known as: RANEXA   Take 1 tablet (500 mg total) by mouth 2 (two) times daily.      SYSTANE OP   Place 1 drop into both eyes as needed. Itchy/dry eye      traZODone 150 MG tablet   Commonly known as: DESYREL   Take 150 mg by mouth at bedtime.      VITAMIN D PO   Take 1 tablet by mouth 2 (two) times daily.      ZINC PO   Take 1 tablet by mouth daily.      ZYRTEC PO   Take 1  tablet by mouth daily as needed. For allergies          BRIEF HPI:  See H&P, Labs, Consult and Test reports for all details in brief, patient was admitted for shortness of breath along with fever and chills. Patient was found to have a fever 102F in the emergency room, he had a chest x-ray that was concerning for pneumonia. He was subsequently admitted to the hospitalist service for evaluation and.  CONSULTATIONS:  None  PERTINENT RADIOLOGIC STUDIES: Dg Chest 2 View  12/26/2011  *RADIOLOGY REPORT*  Clinical Data: Short of breath.  Fever  CHEST - 2 VIEW  Comparison: 05/19/2007  Findings: Heart size upper normal.  Negative for heart failure. Multiple chronic rib fractures on the right.  Mild left lower lobe airspace disease could represent pneumonia. No significant effusion.  Prominent lung markings suggesting chronic lung disease.  IMPRESSION: Mild left lower lobe airspace disease which could represent pneumonia or bronchitis.  COPD.   Original Report Authenticated By: Truett Perna, M.D.      PERTINENT LAB RESULTS: CBC:  Basename 12/28/11 0430 12/27/11 0515  WBC 8.5 10.4  HGB 11.2* 12.2*  HCT 33.0* 36.3*  PLT 100* 99*   CMET CMP     Component Value Date/Time   NA 139 12/27/2011 0515   K 3.9 12/27/2011 0515   CL 100 12/27/2011 0515   CO2 28 12/27/2011 0515   GLUCOSE 150* 12/27/2011 0515   BUN 24* 12/27/2011 0515   CREATININE 1.94* 12/27/2011 0515   CALCIUM 9.0 12/27/2011 0515   PROT 6.4 02/14/2009 0906   ALBUMIN 4.0 02/14/2009 0906   AST 23 02/14/2009 0906   ALT 26 02/14/2009 0906   ALKPHOS 70 02/14/2009 0906   BILITOT 0.8 02/14/2009 0906   GFRNONAA 32* 12/27/2011 0515   GFRAA 37* 12/27/2011 0515    GFR Estimated Creatinine Clearance: 35.5 ml/min (by C-G formula based on Cr of 1.94). No results found for this basename: LIPASE:2,AMYLASE:2 in the last 72 hours  Basename  12/26/11 2233 12/26/11 1712 12/26/11 1135  CKTOTAL -- -- --  CKMB -- -- --  CKMBINDEX -- --  --  TROPONINI <0.30 <0.30 <0.30   No components found with this basename: POCBNP:3 No results found for this basename: DDIMER:2 in the last 72 hours No results found for this basename: HGBA1C:2 in the last 72 hours No results found for this basename: CHOL:2,HDL:2,LDLCALC:2,TRIG:2,CHOLHDL:2,LDLDIRECT:2 in the last 72 hours No results found for this basename: TSH,T4TOTAL,FREET3,T3FREE,THYROIDAB in the last 72 hours No results found for this basename: VITAMINB12:2,FOLATE:2,FERRITIN:2,TIBC:2,IRON:2,RETICCTPCT:2 in the last 72 hours Coags: No results found for this basename: PT:2,INR:2 in the last 72 hours Microbiology: Recent Results (from the past 240 hour(s))  CULTURE, BLOOD (ROUTINE X 2)     Status: Normal (Preliminary result)   Collection Time   12/26/11  8:25 AM      Component Value Range Status Comment   Specimen Description BLOOD LEFT HAND   Final    Special Requests BOTTLES DRAWN AEROBIC AND ANAEROBIC 10CC   Final    Culture  Setup Time     Final    Value: 12/26/2011 13:56  DEMOGRAPHIC UPDATE OCCURRED ON 10/24 AT 0831, QA FLAGS AND RANGES MAY NO LONGER BE VALID   Culture     Final    Value:        BLOOD CULTURE RECEIVED NO GROWTH TO DATE CULTURE WILL BE HELD FOR 5 DAYS BEFORE ISSUING A FINAL NEGATIVE REPORT DEMOGRAPHIC UPDATE OCCURRED ON 10/24 AT 0831, QA FLAGS AND RANGES MAY NO LONGER BE VALID   Report Status PENDING   Incomplete   CULTURE, BLOOD (ROUTINE X 2)     Status: Normal (Preliminary result)   Collection Time   12/26/11  8:26 AM      Component Value Range Status Comment   Specimen Description BLOOD RIGHT ARM   Final    Special Requests BOTTLES DRAWN AEROBIC AND ANAEROBIC 10CC   Final    Culture  Setup Time     Final    Value: 12/26/2011 13:56  DEMOGRAPHIC UPDATE OCCURRED ON 10/24 AT 0831, QA FLAGS AND RANGES MAY NO LONGER BE VALID   Culture     Final    Value:        BLOOD CULTURE RECEIVED NO GROWTH TO DATE CULTURE WILL BE HELD FOR 5 DAYS BEFORE ISSUING A FINAL NEGATIVE  REPORT DEMOGRAPHIC UPDATE OCCURRED ON 10/24 AT 0831, QA FLAGS AND RANGES MAY NO LONGER BE VALID   Report Status PENDING   Incomplete   CULTURE, EXPECTORATED SPUTUM-ASSESSMENT     Status: Normal   Collection Time   12/26/11  3:23 PM      Component Value Range Status Comment   Specimen Description SPUTUM   Final    Special Requests NONE   Final    Sputum evaluation     Final    Value: THIS SPECIMEN IS ACCEPTABLE. RESPIRATORY CULTURE REPORT TO FOLLOW.   Report Status 12/26/2011 FINAL   Final   CULTURE, RESPIRATORY     Status: Normal (Preliminary result)   Collection Time   12/26/11  3:23 PM      Component Value Range Status Comment   Specimen Description SPUTUM   Final    Special Requests NONE   Final    Gram Stain     Final    Value: ABUNDANT WBC PRESENT,BOTH PMN AND MONONUCLEAR     FEW SQUAMOUS EPITHELIAL CELLS PRESENT     ABUNDANT GRAM POSITIVE COCCI  IN PAIRS IN CHAINS IN CLUSTERS   Culture ABUNDANT STREPTOCOCCUS PNEUMONIAE   Final    Report Status PENDING   Incomplete      BRIEF HOSPITAL COURSE:   Principal Problem:  *CAP (community acquired pneumonia) -Patient was febrile on admission, with mild leukocytosis and a chest x-ray positive for mid left lower lobe pneumonia. -Patient was admitted, started on intravenous Avelox, with this he defervesced very quickly. He has had no  fever since admission, leukocytosis has resolved. He symptomatically feels better as well. He'll be discharged home today and he will continue with Avelox for another 5 more days. He will need a repeat chest x-ray in about 6-8 weeks to document resolution/clearance of the consolidation. We will defer this to be done in the outpatient setting by his primary care practitioner.  Active Problems: COPD with mild exacerbation -On examination on 10/24 patient was found to have minimal rhonchi on exam, it was felt that with his prior history of COPD he was perhaps having a mild exacerbation. He was then placed on  scheduled nebulized bronchodilators. Today his lungs are completely clear on my exam, he is admitted in the hallway without any oxygen, post ambulatory O2 saturation is around 94% on room air. -He will be discharged home today, he will continue with Advair, nebulized bronchodilators will be prescribed as needed.   DIABETES MELLITUS, TYPE II -This was stable during this admission. Patient has advanced stage III chronic disease, as a result metformin will be discontinued. He'll only be continued on Amaryl. A1c in August of this year was 6.7.  HYPERLIPIDEMIA  -c/w Lipitor   CAD  -stable  -troponins negative  -c/w ASA, Metoprolol and Statins  -recent Nuclear stress test on 10/3-neg   HYPERTENSION  -controlled with Imdur and Metoprolol   TODAY-DAY OF DISCHARGE:  Subjective:   Roy [name] today has no headache,no chest abdominal pain,no new weakness tingling or numbness, feels much better wants to go home today.   Objective:   Blood pressure 130/77, pulse 70, temperature 98.3 F (36.8 C), temperature source Oral, resp. rate 20, height 5\' 9"  (1.753 m), weight 91 kg (200 lb 9.9 oz), SpO2 94.00%.  Intake/Output Summary (Last 24 hours) at 12/28/11 1057 Last data filed at 12/28/11 0500  Gross per 24 hour  Intake    680 ml  Output      0 ml  Net    680 ml    Exam Awake Alert, Oriented *3, No new F.N deficits, Normal affect [place].AT,PERRAL Supple Neck,No JVD, No cervical lymphadenopathy appriciated.  Symmetrical Chest wall movement, Good air movement bilaterally, CTAB RRR,No Gallops,Rubs or new Murmurs, No Parasternal Heave +ve B.Sounds, Abd Soft, Non tender, No organomegaly appriciated, No rebound -guarding or rigidity. No Cyanosis, Clubbing or edema, No new Rash or bruise  DISCHARGE CONDITION: Stable  DISPOSITION: HOME  DISCHARGE INSTRUCTIONS:    Activity:  As tolerated   Diet recommendation: Diabetic Diet Heart Healthy diet    Follow-up Information    Follow up with  Georgetta Haber, MD. In 1 week.   Contact information:   [place] [phone]            Total Time spent on discharge equals 45 minutes.  SignedOren Binet 12/28/2011 10:57 AM

## 2011-12-29 LAB — CULTURE, RESPIRATORY W GRAM STAIN

## 2011-12-31 ENCOUNTER — Encounter: Payer: Self-pay | Admitting: Internal Medicine

## 2011-12-31 ENCOUNTER — Ambulatory Visit (INDEPENDENT_AMBULATORY_CARE_PROVIDER_SITE_OTHER): Payer: Medicare Other | Admitting: Internal Medicine

## 2011-12-31 VITALS — BP 150/80 | HR 76 | Temp 98.9°F | Resp 16 | Ht 69.0 in | Wt 200.0 lb

## 2011-12-31 DIAGNOSIS — I10 Essential (primary) hypertension: Secondary | ICD-10-CM

## 2011-12-31 DIAGNOSIS — N182 Chronic kidney disease, stage 2 (mild): Secondary | ICD-10-CM

## 2011-12-31 DIAGNOSIS — J189 Pneumonia, unspecified organism: Secondary | ICD-10-CM

## 2011-12-31 MED ORDER — MOXIFLOXACIN HCL 400 MG PO TABS: 400.0000 mg | ORAL_TABLET | Freq: Every day | ORAL | Status: DC

## 2011-12-31 MED ORDER — HYDROCODONE-HOMATROPINE 5-1.5 MG/5ML PO SYRP: 5.0000 mL | ORAL_SOLUTION | Freq: Three times a day (TID) | ORAL | Status: DC | PRN

## 2011-12-31 MED ORDER — CEFTRIAXONE SODIUM 1 G IJ SOLR
1.0000 g | INTRAMUSCULAR | Status: AC
  Administered 2011-12-31: 1 g via INTRAMUSCULAR

## 2011-12-31 NOTE — Progress Notes (Signed)
Subjective:    Patient ID: Roy [name], male    DOB: 01/23/1933, 78 y.o.   MRN: [id number]  HPI 78 year old male who was admitted to the hospital for shortness of breath and found to have community-acquired pneumonia.  He was treated with IV antibiotics and per tablet was discharged to home he still has a persistent low-grade fever cough tightness in the chest and some audible wheezing.  He is on the last 2 days of his Avelox to complete one week of antibiotics.  He has a history of CAD followed by cardiology as a history of chronic renal insufficiency and history of hypertension his blood pressure is elevated today he also has a history of diabetes in his CBGs have been elevated with this infection The patient is off the metformin his renal insufficiency and age     Review of Systems  Constitutional: Positive for activity change and appetite change.  HENT: Positive for hearing loss and rhinorrhea.   Respiratory: Positive for cough, chest tightness and shortness of breath.   Gastrointestinal: Positive for abdominal distention.  Genitourinary: Negative.   Musculoskeletal: Positive for myalgias and arthralgias.  Neurological: Positive for weakness and light-headedness.  Psychiatric/Behavioral: Positive for dysphoric mood. Negative for confusion and decreased concentration.   Past Medical History  Diagnosis Date  . CAD (coronary artery disease)   . Hyperlipidemia   . Hypertension   . COPD (chronic obstructive pulmonary disease)     Stage 2  . Myocardial infarction   . Anginal pain   . Shortness of breath   . Lyme disease   . Sleep apnea     uses cpap  . Diabetes mellitus, type 2   . Heart murmur   . Fever 12/26/2011  . Chronic kidney disease     renal insufficiency  . GERD (gastroesophageal reflux disease)   . Arthritis     spine    History   Social History  . Marital Status: Married    Spouse Name: N/A    Number of Children: N/A  . Years of Education: N/A    Occupational History  . Not on file.   Social History Main Topics  . Smoking status: Former Smoker    Quit date: 03/05/1958  . Smokeless tobacco: Never Used  . Alcohol Use: No  . Drug Use: No  . Sexually Active: Not Currently   Other Topics Concern  . Not on file   Social History Narrative  . No narrative on file    Past Surgical History  Procedure Date  . Cervical laminectomy   . Cholecystectomy   . Appendectomy     Family History  Problem Relation Age of Onset  . ALS Father     Allergies  Allergen Reactions  . Codeine Sulfate Other (See Comments)    Chest pain  . Cephalexin Rash    Current Outpatient Prescriptions on File Prior to Visit  Medication Sig Dispense Refill  . acetaminophen (TYLENOL) 325 MG tablet Take 650 mg by mouth 2 (two) times daily.       [name] albuterol (PROVENTIL) (5 MG/ML) 0.5% nebulizer solution Take 0.5 mLs (2.5 mg total) by nebulization every 6 (six) hours as needed for wheezing.  20 mL  0  . alfuzosin (UROXATRAL) 10 MG 24 hr tablet Take 10 mg by mouth daily.        [name] aspirin 325 MG EC tablet Take 325 mg by mouth daily.        [name] atorvastatin (LIPITOR) 10  MG tablet Take 1 tablet (10 mg total) by mouth daily.  30 tablet  11  . betamethasone dipropionate (DIPROLENE) 0.05 % ointment Apply 1 application topically daily. To outside of ear      . Cetirizine HCl (ZYRTEC PO) Take 1 tablet by mouth daily as needed. For allergies      . Cholecalciferol (VITAMIN D PO) Take 1 tablet by mouth 2 (two) times daily.      . Fluticasone-Salmeterol (ADVAIR DISKUS) 100-50 MCG/DOSE AEPB Inhale 1 puff into the lungs 2 (two) times daily.       [name] glimepiride (AMARYL) 2 MG tablet Take 1 tablet (2 mg total) by mouth daily before breakfast.  30 tablet  11  . guaiFENesin-dextromethorphan (ROBITUSSIN DM) 100-10 MG/5ML syrup Take 5 mLs by mouth every 4 (four) hours as needed for cough.  118 mL  0  . isosorbide mononitrate (IMDUR) 30 MG 24 hr tablet Take 90 mg by mouth  daily.      . metoprolol succinate (TOPROL-XL) 50 MG 24 hr tablet Take 1 tablet (50 mg total) by mouth daily. Take with or immediately following a meal.  90 tablet  3  . moxifloxacin (AVELOX) 400 MG tablet Take 1 tablet (400 mg total) by mouth daily at 8 pm.  5 tablet  0  . Multiple Vitamins-Minerals (ZINC PO) Take 1 tablet by mouth daily.      . nitroGLYCERIN (NITROSTAT) 0.4 MG SL tablet Place 1 tablet (0.4 mg total) under the tongue every 5 (five) minutes as needed.  25 tablet  3  . Omega-3 Fatty Acids (FISH OIL PO) Take 2 capsules by mouth 2 (two) times daily.      [name] OVER THE COUNTER MEDICATION Take 30 mLs by mouth every 4 (four) hours as needed. mucinex fast max  For fever/cold      . Polyethyl Glycol-Propyl Glycol (SYSTANE OP) Place 1 drop into both eyes as needed. Itchy/dry eye      . potassium chloride SA (KLOR-CON M15) 15 MEQ tablet Take 15 mEq by mouth 2 (two) times daily.        . ranitidine (ZANTAC) 300 MG tablet Take 300 mg by mouth 2 (two) times daily.      . ranolazine (RANEXA) 500 MG 12 hr tablet Take 1 tablet (500 mg total) by mouth 2 (two) times daily.  60 tablet  6  . traZODone (DESYREL) 150 MG tablet Take 150 mg by mouth at bedtime.        BP 150/80  Pulse 76  Temp 98.9 F (37.2 C)  Resp 16  Ht 5\' 9"  (1.753 m)  Wt 200 lb (90.719 kg)  BMI 29.53 kg/m2       Objective:   Physical Exam  Nursing note and vitals reviewed. Constitutional: He appears well-developed and well-nourished.  HENT:  Head: Normocephalic and atraumatic.  Eyes: Conjunctivae normal are normal. Pupils are equal, round, and reactive to light.  Neck: Normal range of motion. Neck supple.  Cardiovascular: Normal rate and regular rhythm.   Murmur heard. Pulmonary/Chest: Effort normal. He has wheezes. He has rales. He exhibits tenderness.  Abdominal: Soft. Bowel sounds are normal.          Assessment & Plan:  Reason is on day 5 of therapy for community-acquired pneumonia.  He still has  persistent Rales in his chest and tightness he is on a home nebulizer treatment and was given Avelox for 5 days after discharge.

## 2011-12-31 NOTE — Patient Instructions (Addendum)
7 days of antibiotic you have a shot of Rocephin given to you in the office today and given a cough medicine that has hydrocodone in it if he developed any itching after starting the cough medicine stop the cough medicine and notified the office

## 2011-12-31 NOTE — Addendum Note (Signed)
Addended by: Allyne Gee on: 12/31/2011 04:40 PM   Modules accepted: Orders

## 2012-01-01 LAB — CULTURE, BLOOD (ROUTINE X 2)
Culture: NO GROWTH
Culture: NO GROWTH

## 2012-01-04 ENCOUNTER — Telehealth: Payer: Self-pay | Admitting: Internal Medicine

## 2012-01-04 NOTE — Telephone Encounter (Signed)
Dr jenkins is aware 

## 2012-01-04 NOTE — Telephone Encounter (Signed)
Talked with wife and condition has improved-not as weak,still has exertional sob and tightness in chest--sputum is clear, instructed to call if conditons do not improve

## 2012-01-04 NOTE — Telephone Encounter (Signed)
Caller: [name]/Spouse; Patient Name: Roy [name]; PCP: Benay Pillow (Adults only); Best Callback Phone Number: [phone].  Call regarding update on pt's pnuemonia diagnosis.  [name] calling to let Dr. Arnoldo Morale know how pt is doing.   Afebrile.  Pt's chest feels tight, coughs up clear sputum.  Shortness of breath with activity.  Emergent symptoms r/o pwer Breathing Problems w/exception to 'Intermittment wheezing, short of breath, or cough AND has had viral illnes within last month'.  See Provider in 72 hrs. Appt declined. Home care advice given.

## 2012-01-08 NOTE — Progress Notes (Signed)
Talked with pt and he is feeling much better-still weak,afebrile and beginning to eat again--completes antibioitc tonight and will start metformin in am

## 2012-01-10 ENCOUNTER — Other Ambulatory Visit: Payer: Self-pay | Admitting: Internal Medicine

## 2012-01-22 ENCOUNTER — Ambulatory Visit (INDEPENDENT_AMBULATORY_CARE_PROVIDER_SITE_OTHER): Payer: Medicare Other | Admitting: Internal Medicine

## 2012-01-22 DIAGNOSIS — Z23 Encounter for immunization: Secondary | ICD-10-CM

## 2012-01-23 DIAGNOSIS — Z23 Encounter for immunization: Secondary | ICD-10-CM

## 2012-02-05 ENCOUNTER — Ambulatory Visit (INDEPENDENT_AMBULATORY_CARE_PROVIDER_SITE_OTHER): Payer: Medicare Other | Admitting: Cardiology

## 2012-02-05 ENCOUNTER — Encounter: Payer: Self-pay | Admitting: Cardiology

## 2012-02-05 ENCOUNTER — Encounter: Payer: Self-pay | Admitting: *Deleted

## 2012-02-05 VITALS — BP 110/62 | HR 74 | Ht 69.0 in | Wt 200.0 lb

## 2012-02-05 DIAGNOSIS — N189 Chronic kidney disease, unspecified: Secondary | ICD-10-CM

## 2012-02-05 DIAGNOSIS — R0989 Other specified symptoms and signs involving the circulatory and respiratory systems: Secondary | ICD-10-CM

## 2012-02-05 DIAGNOSIS — I2510 Atherosclerotic heart disease of native coronary artery without angina pectoris: Secondary | ICD-10-CM

## 2012-02-05 DIAGNOSIS — R079 Chest pain, unspecified: Secondary | ICD-10-CM

## 2012-02-05 DIAGNOSIS — R0602 Shortness of breath: Secondary | ICD-10-CM

## 2012-02-05 DIAGNOSIS — R0609 Other forms of dyspnea: Secondary | ICD-10-CM

## 2012-02-05 DIAGNOSIS — R0600 Dyspnea, unspecified: Secondary | ICD-10-CM

## 2012-02-05 NOTE — Progress Notes (Signed)
Patient ID: Roy [name], male   DOB: 1932/08/18, 78 y.o.   MRN: [id number] PCP: Dr. Arnoldo Morale  78 yo with history of CAD s/p PTCA D1 in 1999, CKD, diabetes, HTN, and hyperlipidemia presents for cardiology followup.  For the last several months, he has noted dyspnea and chest tightness with exertion.  This has been slowly progressive.  The pain will resolve with rest.  No symptoms at rest.  He is now at the point where most moderately strenuous activities will bring on chest tightness.  It is happening daily.  He is ok walking in the house but develops tightness if he walks in the yard. He had some chest tightness after walking into the office today.  He is curtailing his activity: has not taken his dogs out to hunt this fall because he is concerned about the chest tightness.    When I saw him initially, I decided to to a stress test rather than cath because of his CKD.  ETT-myoview in 10/13 showed ischemic ECG changes but no TID and only a small fixed basal inferior defect with normal wall motion and EF 65%.   This was overall a low risk study.  Echo in 10/13 showed EF 55-60% with no major abnormalities.  He was actually admitted in 10/13 with fever to 102 and probable PNA.  He was treated with antibiotics and discharged.    He is now on an aggressive anti-anginal regimen with Imdur 90 daily and Toprol XL 50 daily. At last appointment, I added ranolazine 500 mg bid.  He does not think that his has made any difference.  ECG: NSR, normal QT interval  Labs (5/13): LDL 37, HDL 33 Labs (8/13): BNP 38 Labs (10/13): K 4, creatinine 2 => 1.94, BNP 53  PMH: 1. H/o [place] Spotted Fever in 3/13.  2. H/o Lyme disease in 2008. 3. CAD: PTCA D1 in 1999.  Adenosine myoview in (10/09) with EF 60%, no ischemia or infarction.  ETT-sestamibi (10/13) with 7'1" exercise, ischemic ECG changes, small fixed mild basal inferior defect with EF 65% and normal wall motion.  TID not elevated.  This was a low risk study  overall.  Echo (10/13): EF 55-60%, normal RV size and systolic function, normal valves.  4. CKD 5. H/o rheumatic fever in 1951 6. Type II diabetes 7. COPD  8. Hyperlipidemia 9. HTN  SH: Married, lives in [place].  Quit smoking in 1960.  Likes to hunt.    FH: CAD  ROS: All systems reviewed and negative except as per HPI.   Current Outpatient Prescriptions  Medication Sig Dispense Refill  . acetaminophen (TYLENOL) 325 MG tablet Take 650 mg by mouth 2 (two) times daily.       [name] albuterol (PROVENTIL) (5 MG/ML) 0.5% nebulizer solution Take 0.5 mLs (2.5 mg total) by nebulization every 6 (six) hours as needed for wheezing.  20 mL  0  . alfuzosin (UROXATRAL) 10 MG 24 hr tablet Take 10 mg by mouth daily.        [name] aspirin 325 MG EC tablet Take 325 mg by mouth daily.        [name] atorvastatin (LIPITOR) 10 MG tablet Take 1 tablet (10 mg total) by mouth daily.  30 tablet  11  . betamethasone dipropionate (DIPROLENE) 0.05 % ointment Apply 1 application topically daily. To outside of ear      . Cetirizine HCl (ZYRTEC PO) Take 1 tablet by mouth daily as needed. For allergies      .  Cholecalciferol (VITAMIN D PO) Take 1 tablet by mouth 2 (two) times daily.      . Fluticasone-Salmeterol (ADVAIR DISKUS) 100-50 MCG/DOSE AEPB Inhale 1 puff into the lungs 2 (two) times daily.       [name] glimepiride (AMARYL) 2 MG tablet Take 1 tablet (2 mg total) by mouth daily before breakfast.  30 tablet  11  . HYDROcodone-homatropine (HYCODAN) 5-1.5 MG/5ML syrup Take 5 mLs by mouth every 8 (eight) hours as needed for cough.  120 mL  0  . isosorbide mononitrate (IMDUR) 30 MG 24 hr tablet Take 90 mg by mouth daily.      . metoprolol succinate (TOPROL-XL) 50 MG 24 hr tablet Take 1 tablet (50 mg total) by mouth daily. Take with or immediately following a meal.  90 tablet  3  . moxifloxacin (AVELOX) 400 MG tablet Take 1 tablet (400 mg total) by mouth daily.  7 tablet  0  . Multiple Vitamins-Minerals (ZINC PO) Take 1 tablet by  mouth daily.      . nitroGLYCERIN (NITROSTAT) 0.4 MG SL tablet Place 1 tablet (0.4 mg total) under the tongue every 5 (five) minutes as needed.  25 tablet  3  . Omega-3 Fatty Acids (FISH OIL PO) Take 2 capsules by mouth 2 (two) times daily.      [name] OVER THE COUNTER MEDICATION Take 30 mLs by mouth every 4 (four) hours as needed. mucinex fast max  For fever/cold      . Polyethyl Glycol-Propyl Glycol (SYSTANE OP) Place 1 drop into both eyes as needed. Itchy/dry eye      . potassium chloride SA (KLOR-CON M15) 15 MEQ tablet Take 15 mEq by mouth 2 (two) times daily.        . ranitidine (ZANTAC) 300 MG tablet Take 300 mg by mouth 2 (two) times daily.      . ranolazine (RANEXA) 500 MG 12 hr tablet Take 1 tablet (500 mg total) by mouth 2 (two) times daily.  60 tablet  6  . traZODone (DESYREL) 150 MG tablet TAKE 1 TABLET BY MOUTH ONCE A DAY  30 tablet  6    BP 110/62  Pulse 74  Ht 5\' 9"  (1.753 m)  Wt 200 lb (90.719 kg)  BMI 29.53 kg/m2 General: NAD Neck: No JVD, no thyromegaly or thyroid nodule.  Lungs: Clear to auscultation bilaterally with normal respiratory effort. CV: Nondisplaced PMI.  Heart regular S1/S2, no S3/S4, no murmur.  No peripheral edema.  No carotid bruit.  Normal pedal pulses.  Abdomen: Soft, nontender, no hepatosplenomegaly, no distention.  Skin: Intact without lesions or rashes.  Neurologic: Alert and oriented x 3.  Psych: Normal affect. Extremities: No clubbing or cyanosis.  HEENT: Normal.   Assessment/Plan: 1. CAD: Patient continues to have significant daily exertional chest tightness despite aggressive medical management.  He does not have chest pain at rest but does get it walking in the yard, doing light work, [employer] research officer, government.  His symptoms sound classic for angina, and he has known CAD.  His ETT-Sestamibi was overall a low risk study but was not completely normal.  I have delayed on cardiac catheterization due to CKD.  At this point, he is quite limited by his symptoms and medical  management has not helped much.  I think that he will need a left heart cath.  I do this next week.  I will bring him in on Thursday for pre-hydration, followed by cath on Friday attempting to minimize contrast dye.  We discussed  risks/benefits of cardiac cath today, and he understands the renal risk.  Continue current medication regimen.  2. Dyspnea: This may be part of his anginal symptom complex.  He does not appear volume overloaded on exam and there were no significant abnormalities on echo.  BNP has been normal.  It is possible that COPD contributes to both the dyspnea and the chest tightness.  I will also get PFTs.  If these are significantly abnormal and cardiac cath does not show a severe lesion, would consider adding Spiriva to his medication regimen.  3. HTN: BP good today. 4. CKD: Last creatinine 1.94.     [name] 02/05/2012

## 2012-02-05 NOTE — Patient Instructions (Addendum)
Your physician recommends that you return for a FASTING lipid profile /BMET/BNP/CBCd/PT/INR. Schedule this for Monday December 9,2013.  Your physician has requested that you have a cardiac catheterization. Cardiac catheterization is used to diagnose and/or treat various heart conditions. Doctors may recommend this procedure for a number of different reasons. The most common reason is to evaluate chest pain. Chest pain can be a symptom of coronary artery disease (CAD), and cardiac catheterization can show whether plaque is narrowing or blocking your heart's arteries. This procedure is also used to evaluate the valves, as well as measure the blood flow and oxygen levels in different parts of your heart. For further information please visit [email or web address]. Please follow instruction sheet, as given. This is scheduled for Friday December 13,2013 at 7:30am. Roy [name] will be admitted to [facility] Thursday December 12 ,2013 for IV fluids before the catheterization on Friday.  Your physician has recommended that you have a pulmonary function test. Pulmonary Function Tests are a group of tests that measure how well air moves in and out of your lungs.   Your physician recommends that you schedule a follow-up appointment in: about 4 weeks with Dr Aundra Dubin.

## 2012-02-06 ENCOUNTER — Ambulatory Visit (INDEPENDENT_AMBULATORY_CARE_PROVIDER_SITE_OTHER): Payer: Medicare Other | Admitting: Internal Medicine

## 2012-02-06 DIAGNOSIS — R0989 Other specified symptoms and signs involving the circulatory and respiratory systems: Secondary | ICD-10-CM

## 2012-02-06 DIAGNOSIS — R079 Chest pain, unspecified: Secondary | ICD-10-CM

## 2012-02-06 DIAGNOSIS — R0602 Shortness of breath: Secondary | ICD-10-CM

## 2012-02-06 NOTE — Progress Notes (Signed)
PFT done today. 

## 2012-02-11 ENCOUNTER — Other Ambulatory Visit (INDEPENDENT_AMBULATORY_CARE_PROVIDER_SITE_OTHER): Payer: Medicare Other

## 2012-02-11 ENCOUNTER — Encounter (HOSPITAL_COMMUNITY): Payer: Self-pay | Admitting: Pharmacy Technician

## 2012-02-11 DIAGNOSIS — R079 Chest pain, unspecified: Secondary | ICD-10-CM

## 2012-02-11 DIAGNOSIS — R0609 Other forms of dyspnea: Secondary | ICD-10-CM

## 2012-02-11 DIAGNOSIS — R0602 Shortness of breath: Secondary | ICD-10-CM

## 2012-02-11 LAB — LIPID PANEL
HDL: 34.3 mg/dL — ABNORMAL LOW (ref >39.00)
Total CHOL/HDL Ratio: 3
VLDL: 33 mg/dL (ref 0.0–40.0)

## 2012-02-11 LAB — PROTIME-INR: INR: 1.1 ratio — ABNORMAL HIGH (ref 0.8–1.0)

## 2012-02-11 LAB — CBC WITH DIFFERENTIAL/PLATELET
Basophils Relative: 0.1 % (ref 0.0–3.0)
Eosinophils Relative: 18 % — ABNORMAL HIGH (ref 0.0–5.0)
HCT: 37.8 % — ABNORMAL LOW (ref 39.0–52.0)
Lymphs Abs: 1.4 10*3/uL (ref 0.7–4.0)
MCV: 87.3 fl (ref 78.0–100.0)
Monocytes Absolute: 0.5 10*3/uL (ref 0.1–1.0)
RBC: 4.34 Mil/uL (ref 4.22–5.81)
WBC: 6.5 10*3/uL (ref 4.5–10.5)

## 2012-02-11 LAB — BRAIN NATRIURETIC PEPTIDE: Pro B Natriuretic peptide (BNP): 77 pg/mL (ref 0.0–100.0)

## 2012-02-11 LAB — BASIC METABOLIC PANEL
BUN: 28 mg/dL — ABNORMAL HIGH (ref 6–23)
CO2: 30 mEq/L (ref 19–32)
Calcium: 9.4 mg/dL (ref 8.4–10.5)
Creatinine, Ser: 1.9 mg/dL — ABNORMAL HIGH (ref 0.4–1.5)

## 2012-02-14 ENCOUNTER — Encounter (HOSPITAL_COMMUNITY): Payer: Self-pay | Admitting: General Practice

## 2012-02-14 ENCOUNTER — Observation Stay (HOSPITAL_COMMUNITY)
Admission: RE | Admit: 2012-02-14 | Discharge: 2012-02-15 | Disposition: A | Discharge status: Home / Self Care | Payer: Medicare Other | Source: Ambulatory Visit | Attending: Cardiology | Admitting: Cardiology

## 2012-02-14 DIAGNOSIS — J4530 Mild persistent asthma, uncomplicated: Secondary | ICD-10-CM | POA: Diagnosis present

## 2012-02-14 DIAGNOSIS — Z79899 Other long term (current) drug therapy: Secondary | ICD-10-CM | POA: Insufficient documentation

## 2012-02-14 DIAGNOSIS — I2510 Atherosclerotic heart disease of native coronary artery without angina pectoris: Principal | ICD-10-CM | POA: Insufficient documentation

## 2012-02-14 DIAGNOSIS — J4489 Other specified chronic obstructive pulmonary disease: Secondary | ICD-10-CM | POA: Insufficient documentation

## 2012-02-14 DIAGNOSIS — Z7982 Long term (current) use of aspirin: Secondary | ICD-10-CM | POA: Insufficient documentation

## 2012-02-14 DIAGNOSIS — I200 Unstable angina: Secondary | ICD-10-CM

## 2012-02-14 DIAGNOSIS — I10 Essential (primary) hypertension: Secondary | ICD-10-CM | POA: Diagnosis present

## 2012-02-14 DIAGNOSIS — E1129 Type 2 diabetes mellitus with other diabetic kidney complication: Secondary | ICD-10-CM | POA: Diagnosis present

## 2012-02-14 DIAGNOSIS — J449 Chronic obstructive pulmonary disease, unspecified: Secondary | ICD-10-CM | POA: Insufficient documentation

## 2012-02-14 DIAGNOSIS — Z7902 Long term (current) use of antithrombotics/antiplatelets: Secondary | ICD-10-CM | POA: Insufficient documentation

## 2012-02-14 DIAGNOSIS — N182 Chronic kidney disease, stage 2 (mild): Secondary | ICD-10-CM | POA: Insufficient documentation

## 2012-02-14 DIAGNOSIS — R079 Chest pain, unspecified: Secondary | ICD-10-CM

## 2012-02-14 DIAGNOSIS — E785 Hyperlipidemia, unspecified: Secondary | ICD-10-CM

## 2012-02-14 DIAGNOSIS — N1832 Chronic kidney disease, stage 3b: Secondary | ICD-10-CM | POA: Diagnosis present

## 2012-02-14 DIAGNOSIS — E1169 Type 2 diabetes mellitus with other specified complication: Secondary | ICD-10-CM | POA: Diagnosis present

## 2012-02-14 DIAGNOSIS — I129 Hypertensive chronic kidney disease with stage 1 through stage 4 chronic kidney disease, or unspecified chronic kidney disease: Secondary | ICD-10-CM | POA: Insufficient documentation

## 2012-02-14 DIAGNOSIS — I209 Angina pectoris, unspecified: Secondary | ICD-10-CM | POA: Insufficient documentation

## 2012-02-14 DIAGNOSIS — E1159 Type 2 diabetes mellitus with other circulatory complications: Secondary | ICD-10-CM | POA: Diagnosis present

## 2012-02-14 DIAGNOSIS — E119 Type 2 diabetes mellitus without complications: Secondary | ICD-10-CM | POA: Insufficient documentation

## 2012-02-14 LAB — CBC
Hemoglobin: 11.5 g/dL — ABNORMAL LOW (ref 13.0–17.0)
MCH: 28.2 pg (ref 26.0–34.0)
Platelets: 120 10*3/uL — ABNORMAL LOW (ref 150–400)
RBC: 4.08 MIL/uL — ABNORMAL LOW (ref 4.22–5.81)
WBC: 6.7 10*3/uL (ref 4.0–10.5)

## 2012-02-14 LAB — COMPREHENSIVE METABOLIC PANEL
ALT: 15 U/L (ref 0–53)
AST: 17 U/L (ref 0–37)
Albumin: 3.7 g/dL (ref 3.5–5.2)
Calcium: 9.5 mg/dL (ref 8.4–10.5)
Creatinine, Ser: 1.86 mg/dL — ABNORMAL HIGH (ref 0.50–1.35)
GFR calc non Af Amer: 33 mL/min — ABNORMAL LOW (ref >90)
Sodium: 141 mEq/L (ref 135–145)
Total Protein: 6.6 g/dL (ref 6.0–8.3)

## 2012-02-14 LAB — GLUCOSE, CAPILLARY: Glucose-Capillary: 144 mg/dL — ABNORMAL HIGH (ref 70–99)

## 2012-02-14 LAB — APTT: aPTT: 32 seconds (ref 24–37)

## 2012-02-14 LAB — PROTIME-INR: INR: 1.09 (ref 0.00–1.49)

## 2012-02-14 MED ORDER — ENOXAPARIN SODIUM 40 MG/0.4ML ~~LOC~~ SOLN
40.0000 mg | SUBCUTANEOUS | Status: DC
  Administered 2012-02-14: 40 mg via SUBCUTANEOUS
  Filled 2012-02-14 (×2): qty 0.4

## 2012-02-14 MED ORDER — TRIAMCINOLONE ACETONIDE 0.5 % EX OINT
TOPICAL_OINTMENT | Freq: Every day | CUTANEOUS | Status: DC
  Filled 2012-02-14 (×2): qty 15

## 2012-02-14 MED ORDER — POLYVINYL ALCOHOL 1.4 % OP SOLN
1.0000 [drp] | OPHTHALMIC | Status: DC | PRN
  Filled 2012-02-14: qty 15

## 2012-02-14 MED ORDER — ZINC SULFATE 220 (50 ZN) MG PO CAPS
220.0000 mg | ORAL_CAPSULE | Freq: Every day | ORAL | Status: DC
  Administered 2012-02-15: 220 mg via ORAL
  Filled 2012-02-14: qty 1

## 2012-02-14 MED ORDER — INSULIN ASPART 100 UNIT/ML ~~LOC~~ SOLN: 0.0000 [IU] | Freq: Every day | SUBCUTANEOUS | Status: DC

## 2012-02-14 MED ORDER — ISOSORBIDE MONONITRATE ER 60 MG PO TB24
90.0000 mg | ORAL_TABLET | Freq: Every day | ORAL | Status: DC
  Administered 2012-02-15: 90 mg via ORAL
  Filled 2012-02-14: qty 1

## 2012-02-14 MED ORDER — SODIUM CHLORIDE 0.9 % IJ SOLN: 3.0000 mL | INTRAMUSCULAR | Status: DC | PRN

## 2012-02-14 MED ORDER — POTASSIUM CITRATE ER 10 MEQ (1080 MG) PO TBCR
10.0000 meq | EXTENDED_RELEASE_TABLET | Freq: Two times a day (BID) | ORAL | Status: DC
  Administered 2012-02-14 – 2012-02-15 (×2): 10 meq via ORAL
  Filled 2012-02-14 (×5): qty 1

## 2012-02-14 MED ORDER — TRIAMCINOLONE ACETONIDE 0.5 % EX CREA
TOPICAL_CREAM | Freq: Every day | CUTANEOUS | Status: DC
  Filled 2012-02-14 (×2): qty 15

## 2012-02-14 MED ORDER — CALCIUM CARBONATE 1250 (500 CA) MG PO TABS
1.0000 | ORAL_TABLET | Freq: Every day | ORAL | Status: DC
  Administered 2012-02-15: 500 mg via ORAL
  Filled 2012-02-14: qty 1

## 2012-02-14 MED ORDER — ASPIRIN 81 MG PO CHEW
324.0000 mg | CHEWABLE_TABLET | ORAL | Status: AC
  Administered 2012-02-15: 324 mg via ORAL
  Filled 2012-02-14: qty 4

## 2012-02-14 MED ORDER — ONDANSETRON HCL 4 MG/2ML IJ SOLN: 4.0000 mg | Freq: Four times a day (QID) | INTRAMUSCULAR | Status: DC | PRN

## 2012-02-14 MED ORDER — LORATADINE 10 MG PO TABS
10.0000 mg | ORAL_TABLET | Freq: Every day | ORAL | Status: DC
  Administered 2012-02-14: 10 mg via ORAL
  Filled 2012-02-14 (×2): qty 1

## 2012-02-14 MED ORDER — RANOLAZINE ER 500 MG PO TB12
500.0000 mg | ORAL_TABLET | Freq: Two times a day (BID) | ORAL | Status: DC
  Administered 2012-02-14 – 2012-02-15 (×2): 500 mg via ORAL
  Filled 2012-02-14 (×3): qty 1

## 2012-02-14 MED ORDER — ALPRAZOLAM 0.25 MG PO TABS: 0.2500 mg | ORAL_TABLET | Freq: Two times a day (BID) | ORAL | Status: DC | PRN

## 2012-02-14 MED ORDER — GLIMEPIRIDE 2 MG PO TABS
2.0000 mg | ORAL_TABLET | Freq: Every day | ORAL | Status: DC
  Filled 2012-02-14 (×2): qty 1

## 2012-02-14 MED ORDER — POLYETHYL GLYCOL-PROPYL GLYCOL 0.4-0.3 % OP SOLN: 1.0000 [drp] | Freq: Four times a day (QID) | OPHTHALMIC | Status: DC

## 2012-02-14 MED ORDER — SODIUM CHLORIDE 0.9 % IV SOLN
1.0000 mL/kg/h | INTRAVENOUS | Status: DC
  Administered 2012-02-14: 1 mL/kg/h via INTRAVENOUS

## 2012-02-14 MED ORDER — INSULIN ASPART 100 UNIT/ML ~~LOC~~ SOLN: 0.0000 [IU] | Freq: Three times a day (TID) | SUBCUTANEOUS | Status: DC

## 2012-02-14 MED ORDER — TRAZODONE HCL 150 MG PO TABS
150.0000 mg | ORAL_TABLET | Freq: Every day | ORAL | Status: DC
  Administered 2012-02-14: 150 mg via ORAL
  Filled 2012-02-14 (×2): qty 1

## 2012-02-14 MED ORDER — ATORVASTATIN CALCIUM 10 MG PO TABS
10.0000 mg | ORAL_TABLET | Freq: Every day | ORAL | Status: DC
  Administered 2012-02-14: 10 mg via ORAL
  Filled 2012-02-14 (×2): qty 1

## 2012-02-14 MED ORDER — SODIUM CHLORIDE 0.9 % IJ SOLN: 3.0000 mL | Freq: Two times a day (BID) | INTRAMUSCULAR | Status: DC

## 2012-02-14 MED ORDER — MOMETASONE FURO-FORMOTEROL FUM 100-5 MCG/ACT IN AERO
2.0000 | INHALATION_SPRAY | Freq: Two times a day (BID) | RESPIRATORY_TRACT | Status: DC
  Administered 2012-02-14: 2 via RESPIRATORY_TRACT
  Filled 2012-02-14: qty 8.8

## 2012-02-14 MED ORDER — FAMOTIDINE 40 MG PO TABS
40.0000 mg | ORAL_TABLET | Freq: Every day | ORAL | Status: DC
  Administered 2012-02-15: 40 mg via ORAL
  Filled 2012-02-14: qty 1

## 2012-02-14 MED ORDER — METOPROLOL SUCCINATE ER 50 MG PO TB24
50.0000 mg | ORAL_TABLET | Freq: Every day | ORAL | Status: DC
  Administered 2012-02-15: 50 mg via ORAL
  Filled 2012-02-14: qty 1

## 2012-02-14 MED ORDER — ASPIRIN EC 325 MG PO TBEC
325.0000 mg | DELAYED_RELEASE_TABLET | Freq: Every day | ORAL | Status: DC
  Administered 2012-02-15: 325 mg via ORAL

## 2012-02-14 MED ORDER — ZOLPIDEM TARTRATE 5 MG PO TABS: 5.0000 mg | ORAL_TABLET | Freq: Every evening | ORAL | Status: DC | PRN

## 2012-02-14 MED ORDER — ALBUTEROL SULFATE (5 MG/ML) 0.5% IN NEBU: 2.5000 mg | INHALATION_SOLUTION | Freq: Four times a day (QID) | RESPIRATORY_TRACT | Status: DC | PRN

## 2012-02-14 MED ORDER — ACETAMINOPHEN 325 MG PO TABS: 650.0000 mg | ORAL_TABLET | Freq: Every morning | ORAL | Status: DC

## 2012-02-14 MED ORDER — OMEGA-3-ACID ETHYL ESTERS 1 G PO CAPS
2.0000 g | ORAL_CAPSULE | Freq: Two times a day (BID) | ORAL | Status: DC
  Administered 2012-02-14 – 2012-02-15 (×2): 2 g via ORAL
  Filled 2012-02-14 (×3): qty 2

## 2012-02-14 MED ORDER — NITROGLYCERIN 0.4 MG SL SUBL: 0.4000 mg | SUBLINGUAL_TABLET | SUBLINGUAL | Status: DC | PRN

## 2012-02-14 MED ORDER — ACETAMINOPHEN 325 MG PO TABS: 650.0000 mg | ORAL_TABLET | ORAL | Status: DC | PRN

## 2012-02-14 MED ORDER — SODIUM CHLORIDE 0.9 % IV SOLN: 250.0000 mL | INTRAVENOUS | Status: DC | PRN

## 2012-02-14 MED ORDER — ISOSORBIDE MONONITRATE ER 60 MG PO TB24: 90.0000 mg | ORAL_TABLET | Freq: Every morning | ORAL | Status: DC

## 2012-02-14 MED ORDER — ATORVASTATIN CALCIUM 10 MG PO TABS
10.0000 mg | ORAL_TABLET | Freq: Every day | ORAL | Status: DC
  Filled 2012-02-14: qty 1

## 2012-02-14 MED ORDER — LORATADINE 10 MG PO TABS
10.0000 mg | ORAL_TABLET | Freq: Every day | ORAL | Status: DC
  Filled 2012-02-14: qty 1

## 2012-02-14 NOTE — Care Management Note (Unsigned)
    Page 1 of 1   02/14/2012     4:32:33 PM   CARE MANAGEMENT NOTE 02/14/2012  Patient:  Roy [name], Roy [name]   Account Number:  [id number]  Date Initiated:  02/14/2012  Documentation initiated by:  GRAVES-BIGELOW,Shavaughn Seidl  Subjective/Objective Assessment:   Pt admitted for IV hydration for am cath.     Action/Plan:   CM will continue to monitor for disposition needs.   Anticipated DC Date:  02/16/2012   Anticipated DC Plan:  HOME/SELF CARE         Choice offered to / List presented to:             Status of service:  In process, will continue to follow Medicare Important Message given?   (If response is "NO", the following Medicare IM given date fields will be blank) Date Medicare IM given:   Date Additional Medicare IM given:    Discharge Disposition:    Per UR Regulation:  Reviewed for med. necessity/level of care/duration of stay  If discussed at [place] of Stay Meetings, dates discussed:    Comments:

## 2012-02-14 NOTE — Progress Notes (Signed)
Patient Name: Roy [name] Date of Encounter: 02/14/2012  Principal Problem:  *Progressive angina Active Problems:  DIABETES MELLITUS, TYPE II  HYPERLIPIDEMIA  HYPERTENSION  CHRONIC KIDNEY DISEASE STAGE II (MILD)  COPD with chronic bronchitis    SUBJECTIVE: No resting chest pain, has been having chest pain with minimal activity.  OBJECTIVE Filed Vitals:   02/14/12 1219  BP: 116/63  Pulse: 68  Temp: 97.7 F (36.5 C)  TempSrc: Oral  Resp: 18  Height: 5\' 9"  (1.753 m)  Weight: 200 lb (90.719 kg)  SpO2: 97%   No intake or output data in the 24 hours ending 02/14/12 1319 Filed Weights   02/14/12 1219  Weight: 200 lb (90.719 kg)   PHYSICAL EXAM General: Well developed, well nourished, male in no acute distress. Head: Normocephalic, atraumatic.  Neck: Supple without bruits, JVD not elevated. Lungs:  Resp regular and unlabored, few rales bases. Heart: RRR, S1, S2, no S3, S4, 2/6 murmur. Abdomen: Soft, non-tender, non-distended, BS + x 4.  Extremities: No clubbing, cyanosis, no edema.  Neuro: Alert and oriented X 3. Moves all extremities spontaneously. Psych: Normal affect.  LABS: pending CBC:No results found for this basename: WBC:2,NEUTROABS:2,HGB:2,HCT:2,MCV:2,PLT:2 in the last 72 hours INR:No results found for this basename: INR in the last 72 hours Basic Metabolic Panel:No results found for this basename: NA:2,K:2,CL:2,CO2:2,GLUCOSE:2,BUN:2,CREATININE:2,CALCIUM:2,MG:2,PHOS:2 in the last 72 hours Liver Function Tests:No results found for this basename: AST:2,ALT:2,ALKPHOS:2,BILITOT:2,PROT:2,ALBUMIN:2 in the last 72 hours Cardiac Enzymes:No results found for this basename: CKTOTAL:3,CKMB:3,CKMBINDEX:3,TROPONINI:3 in the last 72 hours No results found for this basename: TROPIPOC:2 in the last 72 hours BNP: Pro B Natriuretic peptide (BNP)  Date/Time Value Range Status  02/11/2012  8:23 AM 77.0  0.0 - 100.0 pg/mL Final  12/26/2011  8:12 AM 363.8  0 - 450 pg/mL Final     D-dimer:No results found for this basename: DDIMER:2 in the last 72 hours Hemoglobin A1C:No results found for this basename: HGBA1C in the last 72 hours Fasting Lipid Panel:No results found for this basename: CHOL,HDL,LDLCALC,TRIG,CHOLHDL,LDLDIRECT in the last 72 hours Thyroid Function Tests:No results found for this basename: TSH,T4TOTAL,FREET3,T3FREE,THYROIDAB in the last 72 hours Anemia Panel:No results found for this basename: VITAMINB12,FOLATE,FERRITIN,TIBC,IRON,RETICCTPCT in the last 72 hours  TELE:  SR      Radiology/Studies: No results found.  Current Medications:     . acetaminophen  650 mg Oral q morning - 10a  . aspirin  325 mg Oral Daily  . atorvastatin  10 mg Oral Daily  . calcium carbonate  1 tablet Oral Daily  . enoxaparin (LOVENOX) injection  40 mg Subcutaneous Q24H  . famotidine  40 mg Oral BID  . glimepiride  2 mg Oral QAC breakfast  . isosorbide mononitrate  90 mg Oral q morning - 10a  . loratadine  10 mg Oral Daily  . metoprolol succinate  50 mg Oral Daily  . mometasone-formoterol  2 puff Inhalation BID  . omega-3 acid ethyl esters  2 g Oral BID  . Polyethyl Glycol-Propyl Glycol  1 drop Both Eyes QID  . potassium citrate  10 mEq Oral BID  . ranolazine  500 mg Oral BID  . sodium chloride  3 mL Intravenous Q12H  . traZODone  150 mg Oral QHS  . triamcinolone ointment   Topical Daily  . zinc sulfate  220 mg Oral Daily      ASSESSMENT AND PLAN: Principal Problem:  *Progressive angina - for cath in am Active Problems:  DIABETES MELLITUS, TYPE II  HYPERLIPIDEMIA  HYPERTENSION  CHRONIC KIDNEY DISEASE STAGE II (MILD) - check now, hydrate overnight, follow labs in am  COPD with chronic bronchitis  Otherwise, continue home Rx add SSI.  Signed, Rosaria Ferries , PA-C 1:19 PM 02/14/2012 Patient seen and examined and history reviewed. Agree with above findings and plan. Patient with class 3 angina. Remote PCI in 1999. Symptoms refractory to medical  therapy. For cardiac cath in am. Admitted for overnight hydration for CKD- baseline creatinine 1.9.  Collier [facility] 02/14/2012 1:45 PM

## 2012-02-15 ENCOUNTER — Encounter (HOSPITAL_COMMUNITY)
Admission: RE | Disposition: A | Discharge status: Home / Self Care | Payer: Self-pay | Source: Ambulatory Visit | Attending: Cardiology

## 2012-02-15 DIAGNOSIS — I209 Angina pectoris, unspecified: Secondary | ICD-10-CM

## 2012-02-15 DIAGNOSIS — I2510 Atherosclerotic heart disease of native coronary artery without angina pectoris: Secondary | ICD-10-CM

## 2012-02-15 HISTORY — PX: LEFT HEART CATH: SHX5478

## 2012-02-15 LAB — BASIC METABOLIC PANEL
CO2: 25 mEq/L (ref 19–32)
Calcium: 9.1 mg/dL (ref 8.4–10.5)
Potassium: 4.3 mEq/L (ref 3.5–5.1)
Sodium: 140 mEq/L (ref 135–145)

## 2012-02-15 LAB — GLUCOSE, CAPILLARY
Glucose-Capillary: 136 mg/dL — ABNORMAL HIGH (ref 70–99)
Glucose-Capillary: 159 mg/dL — ABNORMAL HIGH (ref 70–99)
Glucose-Capillary: 165 mg/dL — ABNORMAL HIGH (ref 70–99)

## 2012-02-15 LAB — TSH: TSH: 1.681 u[IU]/mL (ref 0.350–4.500)

## 2012-02-15 SURGERY — LEFT HEART CATHETERIZATION WITH CORONARY ANGIOGRAM
Anesthesia: LOCAL

## 2012-02-15 SURGERY — LEFT HEART CATH
Anesthesia: Moderate Sedation

## 2012-02-15 MED ORDER — LIDOCAINE HCL (PF) 1 % IJ SOLN
INTRAMUSCULAR | Status: AC
  Filled 2012-02-15: qty 30

## 2012-02-15 MED ORDER — FENTANYL CITRATE 0.05 MG/ML IJ SOLN
INTRAMUSCULAR | Status: AC
  Filled 2012-02-15: qty 2

## 2012-02-15 MED ORDER — NITROGLYCERIN 0.2 MG/ML ON CALL CATH LAB
INTRAVENOUS | Status: AC
  Filled 2012-02-15: qty 1

## 2012-02-15 MED ORDER — METFORMIN HCL 500 MG PO TABS: 500.0000 mg | ORAL_TABLET | Freq: Every day | ORAL | Status: DC

## 2012-02-15 MED ORDER — HEPARIN (PORCINE) IN NACL 2-0.9 UNIT/ML-% IJ SOLN
INTRAMUSCULAR | Status: AC
  Filled 2012-02-15: qty 1000

## 2012-02-15 MED ORDER — HEPARIN SODIUM (PORCINE) 1000 UNIT/ML IJ SOLN
INTRAMUSCULAR | Status: AC
  Filled 2012-02-15: qty 1

## 2012-02-15 MED ORDER — MIDAZOLAM HCL 2 MG/2ML IJ SOLN
INTRAMUSCULAR | Status: AC
  Filled 2012-02-15: qty 2

## 2012-02-15 MED ORDER — SODIUM CHLORIDE 0.9 % IV SOLN
INTRAVENOUS | Status: DC
  Administered 2012-02-15: 11:00:00 via INTRAVENOUS

## 2012-02-15 MED ORDER — ONDANSETRON HCL 4 MG/2ML IJ SOLN: 4.0000 mg | Freq: Four times a day (QID) | INTRAMUSCULAR | Status: DC | PRN

## 2012-02-15 MED ORDER — VERAPAMIL HCL 2.5 MG/ML IV SOLN
INTRAVENOUS | Status: AC
  Filled 2012-02-15: qty 2

## 2012-02-15 MED ORDER — ACETAMINOPHEN 325 MG PO TABS: 650.0000 mg | ORAL_TABLET | ORAL | Status: DC | PRN

## 2012-02-15 NOTE — Interval H&P Note (Signed)
History and Physical Interval Note:  02/15/2012 7:50 AM  Roy [name]  has presented today for surgery, with the diagnosis of angina  The various methods of treatment have been discussed with the patient and family. After consideration of risks, benefits and other options for treatment, the patient has consented to  Procedure(s) (LRB) with comments: LEFT HEART CATH (N/A) as a surgical intervention .  The patient's history has been reviewed, patient examined, no change in status, stable for surgery.  I have reviewed the patient's chart and labs.  Questions were answered to the patient's satisfaction.     Delara Shepheard [employer]

## 2012-02-15 NOTE — CV Procedure (Signed)
   Cardiac Catheterization Procedure Note  Name: Roy [name] MRN: [id number] DOB: 03-09-1932  Procedure: Left Heart Cath, Selective Coronary Angiography  Indication: Abnormal stress test, exertional chest pain unrelieved on a good anti-anginal regimen.  The patient was admitted the night prior to procedure for hydration, creatinine was 1.6 this morning.    Procedural Details: The right wrist was prepped, draped, and anesthetized with 1% lidocaine. Using the modified Seldinger technique, a 5 French sheath was introduced into the right radial artery. 3 mg of verapamil was administered through the sheath, weight-based unfractionated heparin was administered intravenously. Standard Judkins catheters were used for selective coronary angiography.  No LV-gram due to CKD. Catheter exchanges were performed over an exchange length guidewire. There were no immediate procedural complications. A TR band was used for radial hemostasis at the completion of the procedure.  The patient was transferred to the post catheterization recovery area for further monitoring.  Procedural Findings: Hemodynamics: AO 133/66 LV 133/11  Coronary angiography: Coronary dominance: right  Left mainstem: No significant disease.   Left anterior descending (LAD):  There was a moderate D1 with up to 50% ostial stenosis.  30% proximal LAD stenosis at D1, 30% mid LAD stenosis.  There was no significant obstructive disease in the LAD but the distal LAD was small in caliber.   Left circumflex (LCx): Small ramus with luminal irregularities.  Moderate OM1.  The distal small branches of OM1 and ramus appeared to have diffuse disease.   Right coronary artery (RCA): Large vessel, luminal irregularities.   Left ventriculography: Not done, CKD  We used 35-40 cc contrast  Final Conclusions:  No significant obstructive disease noted.  Symptoms and abnormal stress ECG may be due to diffuse small vessel disease.  Continue current medical  regimen.  Will hydrate post-cath.   [name] 02/15/2012, 8:20 AM

## 2012-02-15 NOTE — H&P (View-Only) (Signed)
Patient Name: Roy [name] Date of Encounter: 02/14/2012  Principal Problem:  *Progressive angina Active Problems:  DIABETES MELLITUS, TYPE II  HYPERLIPIDEMIA  HYPERTENSION  CHRONIC KIDNEY DISEASE STAGE II (MILD)  COPD with chronic bronchitis    SUBJECTIVE: No resting chest pain, has been having chest pain with minimal activity.  OBJECTIVE Filed Vitals:   02/14/12 1219  BP: 116/63  Pulse: 68  Temp: 97.7 F (36.5 C)  TempSrc: Oral  Resp: 18  Height: 5\' 9"  (1.753 m)  Weight: 200 lb (90.719 kg)  SpO2: 97%   No intake or output data in the 24 hours ending 02/14/12 1319 Filed Weights   02/14/12 1219  Weight: 200 lb (90.719 kg)   PHYSICAL EXAM General: Well developed, well nourished, male in no acute distress. Head: Normocephalic, atraumatic.  Neck: Supple without bruits, JVD not elevated. Lungs:  Resp regular and unlabored, few rales bases. Heart: RRR, S1, S2, no S3, S4, 2/6 murmur. Abdomen: Soft, non-tender, non-distended, BS + x 4.  Extremities: No clubbing, cyanosis, no edema.  Neuro: Alert and oriented X 3. Moves all extremities spontaneously. Psych: Normal affect.  LABS: pending CBC:No results found for this basename: WBC:2,NEUTROABS:2,HGB:2,HCT:2,MCV:2,PLT:2 in the last 72 hours INR:No results found for this basename: INR in the last 72 hours Basic Metabolic Panel:No results found for this basename: NA:2,K:2,CL:2,CO2:2,GLUCOSE:2,BUN:2,CREATININE:2,CALCIUM:2,MG:2,PHOS:2 in the last 72 hours Liver Function Tests:No results found for this basename: AST:2,ALT:2,ALKPHOS:2,BILITOT:2,PROT:2,ALBUMIN:2 in the last 72 hours Cardiac Enzymes:No results found for this basename: CKTOTAL:3,CKMB:3,CKMBINDEX:3,TROPONINI:3 in the last 72 hours No results found for this basename: TROPIPOC:2 in the last 72 hours BNP: Pro B Natriuretic peptide (BNP)  Date/Time Value Range Status  02/11/2012  8:23 AM 77.0  0.0 - 100.0 pg/mL Final  12/26/2011  8:12 AM 363.8  0 - 450 pg/mL Final     D-dimer:No results found for this basename: DDIMER:2 in the last 72 hours Hemoglobin A1C:No results found for this basename: HGBA1C in the last 72 hours Fasting Lipid Panel:No results found for this basename: CHOL,HDL,LDLCALC,TRIG,CHOLHDL,LDLDIRECT in the last 72 hours Thyroid Function Tests:No results found for this basename: TSH,T4TOTAL,FREET3,T3FREE,THYROIDAB in the last 72 hours Anemia Panel:No results found for this basename: VITAMINB12,FOLATE,FERRITIN,TIBC,IRON,RETICCTPCT in the last 72 hours  TELE:  SR      Radiology/Studies: No results found.  Current Medications:     . acetaminophen  650 mg Oral q morning - 10a  . aspirin  325 mg Oral Daily  . atorvastatin  10 mg Oral Daily  . calcium carbonate  1 tablet Oral Daily  . enoxaparin (LOVENOX) injection  40 mg Subcutaneous Q24H  . famotidine  40 mg Oral BID  . glimepiride  2 mg Oral QAC breakfast  . isosorbide mononitrate  90 mg Oral q morning - 10a  . loratadine  10 mg Oral Daily  . metoprolol succinate  50 mg Oral Daily  . mometasone-formoterol  2 puff Inhalation BID  . omega-3 acid ethyl esters  2 g Oral BID  . Polyethyl Glycol-Propyl Glycol  1 drop Both Eyes QID  . potassium citrate  10 mEq Oral BID  . ranolazine  500 mg Oral BID  . sodium chloride  3 mL Intravenous Q12H  . traZODone  150 mg Oral QHS  . triamcinolone ointment   Topical Daily  . zinc sulfate  220 mg Oral Daily      ASSESSMENT AND PLAN: Principal Problem:  *Progressive angina - for cath in am Active Problems:  DIABETES MELLITUS, TYPE II  HYPERLIPIDEMIA  HYPERTENSION  CHRONIC KIDNEY DISEASE STAGE II (MILD) - check now, hydrate overnight, follow labs in am  COPD with chronic bronchitis  Otherwise, continue home Rx add SSI.  Signed, Rosaria Ferries , PA-C 1:19 PM 02/14/2012 Patient seen and examined and history reviewed. Agree with above findings and plan. Patient with class 3 angina. Remote PCI in 1999. Symptoms refractory to medical  therapy. For cardiac cath in am. Admitted for overnight hydration for CKD- baseline creatinine 1.9.  Collier [facility] 02/14/2012 1:45 PM

## 2012-02-15 NOTE — Discharge Summary (Signed)
Discharge Summary   Patient ID: Roy [name],  MRN: [id number], DOB/AGE: 1933/02/23 78 y.o.  Admit date: 02/14/2012 Discharge date: 02/15/2012  Primary Physician: Georgetta Haber, MD Primary Cardiologist: [name], MD  Discharge Diagnoses Principal Problem:  *Angina, class III Active Problems:  DIABETES MELLITUS, TYPE II  HYPERLIPIDEMIA  HYPERTENSION  CHRONIC KIDNEY DISEASE STAGE II (MILD)  COPD with chronic bronchitis   Allergies Allergies  Allergen Reactions  . Codeine Sulfate Other (See Comments)    Chest pain  . Cephalexin Rash    Diagnostic Studies/Procedures  Hemodynamics:  AO 133/66  LV 133/11  Coronary angiography:  Coronary dominance: right  Left mainstem: No significant disease.  Left anterior descending (LAD): There was a moderate D1 with up to 50% ostial stenosis. 30% proximal LAD stenosis at D1, 30% mid LAD stenosis. There was no significant obstructive disease in the LAD but the distal LAD was small in caliber.  Left circumflex (LCx): Small ramus with luminal irregularities. Moderate OM1. The distal small branches of OM1 and ramus appeared to have diffuse disease.  Right coronary artery (RCA): Large vessel, luminal irregularities.  Left ventriculography: Not done, CKD  History of Present Illness/Hospital Course   Roy [name] is a 78yo male with the above problem list who followed up with Dr. Aundra Dubin on 02/05/12 complaining of worsening dyspnea and chest tightness with exertion. He has a history of CAD (s/p PTCA D1 in 1999), CKD, DM, HTN and HLD. The pain was noted to be slowly progressive, and resolving with rest, but occurring on moderate exertion. He was initially seen for similar complaints at which time an ETT-myoview was ordered given underlying CKD, and found to be low-risk. He was started on an aggressive antianginal regimen. Given his cardiac history and symptoms consistent with angina, class III, the decision was made to pursue with left  heart catheterization. He was brought in on 02/14/12 for pre-hydration with plans for cath the following day. He remained stable overnight. He was informed, consented and prepped for cardiac catheterization which is detailed above. This was notable for 50% ostial D1, 30% proximal LAD, 30% mid LAD stenoses. The patients symptoms were determined to be largely due to diffuse small vessel disease, and to continue medical management. He was hydrated post-cath and deemed stable for discharge. He will follow-up as noted below. BMET will be checked in 1 week to assess renal function post-cath. He has an appointment with Dr. Aundra Dubin in 1 month. This information, including post-cath instructions, has been clearly outlined in the discharge AVS.   Discharge Vitals:  Blood pressure 136/63, pulse 61, temperature 97.3 F (36.3 C), temperature source Oral, resp. rate 17, height 5\' 9"  (1.753 m), weight 89.268 kg (196 lb 12.8 oz), SpO2 97.00%.   Labs: Recent Labs  Basename 02/14/12 1346   WBC 6.7   HGB 11.5*   HCT 34.7*   MCV 85.0   PLT 120*   Lab 02/15/12 0600 02/14/12 1346 02/11/12 0823  NA 140 141 138  K 4.3 4.3 4.2  CL 105 104 102  CO2 25 27 30   BUN 26* 33* 28*  CREATININE 1.62* 1.86* 1.9*  CALCIUM 9.1 9.5 9.4  PROT -- 6.6 --  BILITOT -- 0.4 --  ALKPHOS -- 85 --  ALT -- 15 --  AST -- 17 --  AMYLASE -- -- --  LIPASE -- -- --  GLUCOSE 144* 178* 192*   Basename 02/14/12 1346  TSH 1.681  T4TOTAL --  T3FREE --  THYROIDAB --  Disposition:  Discharge Orders    Future Appointments: Provider: Department: Dept Phone: Center:   02/20/2012 10:10 AM [facility] [place]) [phone] [facility]   03/11/2012 10:15 AM Larey Dresser, MD [place]) [phone] [facility]   03/19/2012 10:15 AM Ricard Dillon, MD [place] [facility]     Future Orders Please Complete By Expires   Diet - low sodium  heart healthy      Increase activity slowly        Follow-up Information    Follow up with [facility]. On 02/20/2012. (For lab work after your heart catheterization. Please come between 8:00 AM and 4:30 PM. )    Contact information:   [place] [phone]       Follow up with [name], MD. On 03/11/2012. (As previously scheduled. Appointment at 10:30 AM. )    Contact information:   [id number] [place] [address] [phone]         Discharge Medications:    Medication List     As of 02/15/2012  2:06 PM    CONTINUE taking these medications         acetaminophen 325 MG tablet   Commonly known as: TYLENOL      ADVAIR DISKUS 100-50 MCG/DOSE Aepb   Generic drug: Fluticasone-Salmeterol      albuterol (5 MG/ML) 0.5% nebulizer solution   Commonly known as: PROVENTIL      aspirin 325 MG EC tablet      atorvastatin 10 MG tablet   Commonly known as: LIPITOR   Take 1 tablet (10 mg total) by mouth daily.      betamethasone dipropionate 0.05 % ointment   Commonly known as: DIPROLENE      calcium-vitamin D 500-200 MG-UNIT per tablet   Commonly known as: OSCAL WITH D      FISH OIL PO      glimepiride 2 MG tablet   Commonly known as: AMARYL   Take 1 tablet (2 mg total) by mouth daily before breakfast.      isosorbide mononitrate 30 MG 24 hr tablet   Commonly known as: IMDUR      loratadine 10 MG tablet   Commonly known as: CLARITIN      metFORMIN 500 MG tablet   Commonly known as: GLUCOPHAGE   Take 1 tablet (500 mg total) by mouth daily with breakfast.   Start taking on: 02/16/2012      metoprolol succinate 50 MG 24 hr tablet   Commonly known as: TOPROL-XL   Take 1 tablet (50 mg total) by mouth daily. Take with or immediately following a meal.      nitroGLYCERIN 0.4 MG SL tablet   Commonly known as: NITROSTAT      potassium citrate 10 MEQ (1080 MG) SR tablet   Commonly known  as: UROCIT-K      ranitidine 300 MG tablet   Commonly known as: ZANTAC      ranolazine 500 MG 12 hr tablet   Commonly known as: RANEXA   Take 1 tablet (500 mg total) by mouth 2 (two) times daily.      SYSTANE OP      traZODone 150 MG tablet   Commonly known as: DESYREL      zinc sulfate 220 MG capsule      ZYRTEC PO  Where to get your medications       Information on where to get these meds is not yet available. Ask your nurse or doctor.         metFORMIN 500 MG tablet           Outstanding Labs/Studies: BMET in 1 week   Duration of Discharge Encounter: Greater than 30 minutes including physician time.  Signed, R. Valeria Batman, PA-C 02/15/2012, 2:06 PM

## 2012-02-19 ENCOUNTER — Other Ambulatory Visit: Payer: Self-pay | Admitting: *Deleted

## 2012-02-20 ENCOUNTER — Other Ambulatory Visit: Payer: Medicare Other

## 2012-02-21 ENCOUNTER — Other Ambulatory Visit (INDEPENDENT_AMBULATORY_CARE_PROVIDER_SITE_OTHER): Payer: Medicare Other

## 2012-02-21 DIAGNOSIS — N183 Chronic kidney disease, stage 3 unspecified: Secondary | ICD-10-CM

## 2012-02-21 LAB — BASIC METABOLIC PANEL
CO2: 29 mEq/L (ref 19–32)
Chloride: 101 mEq/L (ref 96–112)
Creatinine, Ser: 1.8 mg/dL — ABNORMAL HIGH (ref 0.4–1.5)
Sodium: 138 mEq/L (ref 135–145)

## 2012-02-22 ENCOUNTER — Telehealth: Payer: Self-pay | Admitting: *Deleted

## 2012-02-22 NOTE — Telephone Encounter (Signed)
Pt.notified

## 2012-02-22 NOTE — Telephone Encounter (Signed)
Dr Aundra Dubin reviewed PFT done 02/06/12 -mild COPD. LMTCB for pt

## 2012-03-06 ENCOUNTER — Other Ambulatory Visit: Payer: Self-pay | Admitting: Internal Medicine

## 2012-03-07 ENCOUNTER — Encounter: Payer: Self-pay | Admitting: *Deleted

## 2012-03-11 ENCOUNTER — Encounter: Payer: Self-pay | Admitting: Cardiology

## 2012-03-11 ENCOUNTER — Ambulatory Visit (INDEPENDENT_AMBULATORY_CARE_PROVIDER_SITE_OTHER): Payer: Medicare Other | Admitting: Cardiology

## 2012-03-11 VITALS — BP 124/76 | HR 62 | Ht 69.0 in | Wt 196.0 lb

## 2012-03-11 DIAGNOSIS — E785 Hyperlipidemia, unspecified: Secondary | ICD-10-CM

## 2012-03-11 DIAGNOSIS — I10 Essential (primary) hypertension: Secondary | ICD-10-CM

## 2012-03-11 DIAGNOSIS — N182 Chronic kidney disease, stage 2 (mild): Secondary | ICD-10-CM

## 2012-03-11 DIAGNOSIS — I2510 Atherosclerotic heart disease of native coronary artery without angina pectoris: Secondary | ICD-10-CM

## 2012-03-11 DIAGNOSIS — R0609 Other forms of dyspnea: Secondary | ICD-10-CM

## 2012-03-11 DIAGNOSIS — R0600 Dyspnea, unspecified: Secondary | ICD-10-CM

## 2012-03-11 NOTE — Patient Instructions (Addendum)
Decrease aspirin to 81mg  daily.  Your physician recommends that you return for a FASTING lipid profile /BMET in the next day or so.   Your physician wants you to follow-up in: 3 months with Dr Aundra Dubin. (April 2014). You will receive a reminder letter in the mail two months in advance. If you don't receive a letter, please call our office to schedule the follow-up appointment.

## 2012-03-11 NOTE — Progress Notes (Signed)
Patient ID: ANKUSH WIDRIG, male   DOB: Feb 22, 1933, 79 y.o.   MRN: [id number] PCP: Dr. Arnoldo Morale  79 yo with history of CAD s/p PTCA D1 in 1999, CKD, diabetes, HTN, and hyperlipidemia presents for cardiology followup.  During the fall, he noted dyspnea and chest tightness with exertion.  This was slowly progressive.  He was having daily chest tightness.  Initially, I decided to to a stress test rather than cath because of his CKD.  ETT-myoview in 10/13 showed ischemic ECG changes but no TID and only a small fixed basal inferior defect with normal wall motion and EF 65%.   This was overall a low risk study.  Echo in 10/13 showed EF 55-60% with no major abnormalities.  Given ongoing symptoms, I admitted him overnight for hydration then cathed him in 12/13.  This showed nonobstructive CAD.  Exertional chest pain thought to be due to small vessel disease.  PFTs showed only mild obstructive airways disease.   Given ongoing significant symptoms, I cathed him in 12/13.  This showed nonobstructive CAD.  His symptoms were classic for angina, so may be due to small vessel disease.  Creatinine was stable post-cath.  Today, patient says that he feels much better.  He thinks that the ranolazine which we started not long ago is helping.  Chest pain now is quite rare and only mild.  He has been rabbit hunting 4 times since the cath.  His breathing also seems better with ranolazine.    ECG: NSR, normal QT interval  Labs (5/13): LDL 37, HDL 33 Labs (8/13): BNP 38 Labs (10/13): K 4, creatinine 2 => 1.94, BNP 53 Labs (12/13): K 4.2, creatinine 1.8  PMH: 1. H/o [place] Spotted Fever in 3/13.  2. H/o Lyme disease in 2008. 3. CAD: PTCA D1 in 1999.  Adenosine myoview in (10/09) with EF 60%, no ischemia or infarction.  ETT-sestamibi (10/13) with 7'1" exercise, ischemic ECG changes, small fixed mild basal inferior defect with EF 65% and normal wall motion.  TID not elevated.  This was a low risk study overall.  Echo (10/13):  EF 55-60%, normal RV size and systolic function, normal valves.  LHC (12/13): Nonobstructive CAD, ? Small vessel disease as cause of his exertional chest pain.  Improved symptoms with ranolazine.  4. CKD 5. H/o rheumatic fever in 1951 6. Type II diabetes 7. COPD: PFTs (12/13) with mild obstructive airways disease and insignificant response to bronchodilators.   8. Hyperlipidemia 9. HTN  SH: Married, lives in [place].  Quit smoking in 1960.  Likes to hunt.    FH: CAD  ROS: All systems reviewed and negative except as per HPI.   Current Outpatient Prescriptions  Medication Sig Dispense Refill  . acetaminophen (TYLENOL) 325 MG tablet Take 650 mg by mouth every evening.      [name] albuterol (PROVENTIL) (5 MG/ML) 0.5% nebulizer solution Take 2.5 mg by nebulization every 6 (six) hours as needed. For shortness of breath      . atorvastatin (LIPITOR) 10 MG tablet Take 1 tablet (10 mg total) by mouth daily.  30 tablet  11  . betamethasone dipropionate (DIPROLENE) 0.05 % ointment Apply 1 application topically daily. To outside of ear      . calcium-vitamin D (OSCAL WITH D) 500-200 MG-UNIT per tablet Take 1 tablet by mouth daily.      . Cetirizine HCl (ZYRTEC PO) Take 1 tablet by mouth daily as needed. For allergies      . Fluticasone-Salmeterol (ADVAIR  DISKUS) 100-50 MCG/DOSE AEPB Inhale 1 puff into the lungs 2 (two) times daily.       [name] glimepiride (AMARYL) 2 MG tablet Take 1 tablet (2 mg total) by mouth daily before breakfast.  30 tablet  11  . isosorbide mononitrate (IMDUR) 30 MG 24 hr tablet TAKE 3 TABLETS BY MOUTH ONCE DAILY  90 tablet  3  . loratadine (CLARITIN) 10 MG tablet Take 10 mg by mouth daily.      . metFORMIN (GLUCOPHAGE) 500 MG tablet Take 1 tablet (500 mg total) by mouth daily with breakfast.      . metoprolol succinate (TOPROL-XL) 50 MG 24 hr tablet Take 1 tablet (50 mg total) by mouth daily. Take with or immediately following a meal.  90 tablet  3  . nitroGLYCERIN (NITROSTAT) 0.4  MG SL tablet Place 0.4 mg under the tongue every 5 (five) minutes as needed. For chest pain      . Omega-3 Fatty Acids (FISH OIL PO) Take 2 capsules by mouth 2 (two) times daily.      [name] (SYSTANE OP) Place 1 drop into both eyes 4 (four) times daily. Itchy/dry eye      . potassium citrate (UROCIT-K) 10 MEQ (1080 MG) SR tablet Take 10 mEq by mouth Twice daily.      . ranitidine (ZANTAC) 300 MG tablet Take 300 mg by mouth 2 (two) times daily.      . ranolazine (RANEXA) 500 MG 12 hr tablet Take 1 tablet (500 mg total) by mouth 2 (two) times daily.  60 tablet  6  . traZODone (DESYREL) 150 MG tablet Take 150 mg by mouth at bedtime.      [name] zinc sulfate 220 MG capsule Take 220 mg by mouth daily.      [name] aspirin EC 81 MG tablet Take 1 tablet (81 mg total) by mouth daily.        BP 124/76  Pulse 62  Ht 5\' 9"  (1.753 m)  Wt 196 lb (88.905 kg)  BMI 28.94 kg/m2 General: NAD Neck: No JVD, no thyromegaly or thyroid nodule.  Lungs: Clear to auscultation bilaterally with normal respiratory effort. CV: Nondisplaced PMI.  Heart regular S1/S2, no S3/S4, no murmur.  No peripheral edema.  No carotid bruit.  Normal pedal pulses.  Abdomen: Soft, nontender, no hepatosplenomegaly, no distention.  Neurologic: Alert and oriented x 3.  Psych: Normal affect. Extremities: No clubbing or cyanosis.   Assessment/Plan: 1. CAD: Nonobstructive CAD on 12/13 cath.  I think that his symptoms may be due to small vessel disease, which could explain the ischemic ECG response to exercise with only mild perfusion abnormality.  He is doing much better with advancement of his anti-anginal regimen. He thinks that ranolazine has helped.  QT interval normal on ECG today.  Continue statin, Toprol XL, ranolazine, and Imdur.  He can decrease ASA dose to 81 mg daily.  2. Dyspnea: This may be part of his anginal symptom complex.  He does not appear volume overloaded on exam and there were no significant  abnormalities on echo.  BNP has been normal.  PFTs showed only mild obstructive airways disease.  This has improved since starting ranolazine.  3. HTN: BP good today. 4. CKD: Last creatinine 1.8 post-cath.  Will repeat BMET.    5. Hyperlipidemia: Will check fasting lipids.  Continue statin.   [name] 03/11/2012

## 2012-03-14 ENCOUNTER — Other Ambulatory Visit (INDEPENDENT_AMBULATORY_CARE_PROVIDER_SITE_OTHER): Payer: Medicare Other

## 2012-03-14 ENCOUNTER — Other Ambulatory Visit: Payer: Self-pay | Admitting: *Deleted

## 2012-03-14 DIAGNOSIS — I2510 Atherosclerotic heart disease of native coronary artery without angina pectoris: Secondary | ICD-10-CM

## 2012-03-14 DIAGNOSIS — N182 Chronic kidney disease, stage 2 (mild): Secondary | ICD-10-CM

## 2012-03-14 LAB — LIPID PANEL
Cholesterol: 102 mg/dL (ref 0–200)
HDL: 33.1 mg/dL — ABNORMAL LOW
LDL Cholesterol: 46 mg/dL (ref 0–99)
Total CHOL/HDL Ratio: 3
Triglycerides: 116 mg/dL (ref 0.0–149.0)
VLDL: 23.2 mg/dL (ref 0.0–40.0)

## 2012-03-14 LAB — BASIC METABOLIC PANEL
Calcium: 9.7 mg/dL (ref 8.4–10.5)
Creatinine, Ser: 2.1 mg/dL — ABNORMAL HIGH (ref 0.4–1.5)
GFR: 33.05 mL/min — ABNORMAL LOW (ref >60.00)

## 2012-03-19 ENCOUNTER — Ambulatory Visit (INDEPENDENT_AMBULATORY_CARE_PROVIDER_SITE_OTHER): Payer: Medicare Other | Admitting: Internal Medicine

## 2012-03-19 ENCOUNTER — Encounter: Payer: Self-pay | Admitting: Internal Medicine

## 2012-03-19 VITALS — BP 146/78 | HR 76 | Temp 97.8°F | Resp 16 | Ht 69.0 in | Wt 196.0 lb

## 2012-03-19 DIAGNOSIS — E785 Hyperlipidemia, unspecified: Secondary | ICD-10-CM

## 2012-03-19 DIAGNOSIS — E119 Type 2 diabetes mellitus without complications: Secondary | ICD-10-CM

## 2012-03-19 DIAGNOSIS — J441 Chronic obstructive pulmonary disease with (acute) exacerbation: Secondary | ICD-10-CM

## 2012-03-19 DIAGNOSIS — J449 Chronic obstructive pulmonary disease, unspecified: Secondary | ICD-10-CM

## 2012-03-19 DIAGNOSIS — G20 Parkinson's disease: Secondary | ICD-10-CM

## 2012-03-19 MED ORDER — MOMETASONE FURO-FORMOTEROL FUM 100-5 MCG/ACT IN AERO: 2.0000 | INHALATION_SPRAY | Freq: Two times a day (BID) | RESPIRATORY_TRACT | Status: DC

## 2012-03-19 NOTE — Progress Notes (Signed)
  Subjective:    Patient ID: Roy [name], male    DOB: Feb 24, 1933, 79 y.o.   MRN: [id number]  HPI This is a 79 year old with a history of hypertension hyperlipidemia and diabetes as well as COPD who was hospitalized in December for unstable angina.  After cardiac catheterization and evaluation was decided to begin a antianginal protocol.  He has done well on his new medication with less chest pain and some decline in COPD shortness of breath Olivia the opportunity to improve some of his COPD control with a different inhaler than the Advair   Review of Systems  Constitutional: Positive for activity change. Negative for fever and fatigue.  HENT: Negative for hearing loss, congestion, neck pain and postnasal drip.   Eyes: Negative for discharge, redness and visual disturbance.  Respiratory: Positive for chest tightness and shortness of breath. Negative for cough and wheezing.   Cardiovascular: Negative for leg swelling.  Gastrointestinal: Negative for abdominal pain, constipation and abdominal distention.  Genitourinary: Negative for urgency and frequency.  Musculoskeletal: Positive for joint swelling and gait problem. Negative for arthralgias.  Skin: Negative for color change and rash.  Neurological: Negative for weakness and light-headedness.  Hematological: Negative for adenopathy.  Psychiatric/Behavioral: Negative for behavioral problems.       Objective:   Physical Exam  Vitals reviewed. Constitutional: He appears well-developed and well-nourished.  HENT:  Head: Normocephalic and atraumatic.  Eyes: Conjunctivae normal are normal. Pupils are equal, round, and reactive to light.  Neck: Normal range of motion. Neck supple.  Cardiovascular: Normal rate and regular rhythm.   Murmur heard. Pulmonary/Chest: Effort normal and breath sounds normal.  Abdominal: Soft. Bowel sounds are normal.          Assessment & Plan:  Change from Advair to   [facility] 2 puffs by mouth twice a  day samples given and instructions in use  Changes the cardiologist made in his anginal protocol he seems to be doing better with that.  Blood pressure looks stable Is as well some renal insufficiency and cardiology has been monitoring his creatinine has bumped to 2.1

## 2012-03-19 NOTE — Patient Instructions (Signed)
.  replace advair with dulera

## 2012-03-24 ENCOUNTER — Other Ambulatory Visit (INDEPENDENT_AMBULATORY_CARE_PROVIDER_SITE_OTHER): Payer: Medicare Other

## 2012-03-24 DIAGNOSIS — N182 Chronic kidney disease, stage 2 (mild): Secondary | ICD-10-CM

## 2012-03-24 LAB — BASIC METABOLIC PANEL
GFR: 32.86 mL/min — ABNORMAL LOW (ref >60.00)
Potassium: 3.9 mEq/L (ref 3.5–5.1)
Sodium: 137 mEq/L (ref 135–145)

## 2012-04-28 ENCOUNTER — Other Ambulatory Visit: Payer: Self-pay | Admitting: Internal Medicine

## 2012-05-12 ENCOUNTER — Other Ambulatory Visit: Payer: Self-pay | Admitting: Internal Medicine

## 2012-05-19 ENCOUNTER — Other Ambulatory Visit: Payer: Self-pay | Admitting: Internal Medicine

## 2012-05-21 ENCOUNTER — Encounter: Payer: Self-pay | Admitting: Pulmonary Disease

## 2012-05-21 ENCOUNTER — Encounter: Payer: Self-pay | Admitting: Internal Medicine

## 2012-06-12 ENCOUNTER — Other Ambulatory Visit: Payer: Self-pay | Admitting: Internal Medicine

## 2012-06-13 ENCOUNTER — Encounter: Payer: Self-pay | Admitting: Cardiology

## 2012-06-13 ENCOUNTER — Ambulatory Visit (INDEPENDENT_AMBULATORY_CARE_PROVIDER_SITE_OTHER): Payer: Medicare Other | Admitting: Cardiology

## 2012-06-13 VITALS — BP 140/72 | HR 64 | Ht 69.0 in | Wt 192.4 lb

## 2012-06-13 DIAGNOSIS — I2510 Atherosclerotic heart disease of native coronary artery without angina pectoris: Secondary | ICD-10-CM

## 2012-06-13 DIAGNOSIS — E785 Hyperlipidemia, unspecified: Secondary | ICD-10-CM

## 2012-06-13 NOTE — Patient Instructions (Addendum)
Your physician recommends that you continue on your current medications as directed. Please refer to the Current Medication list given to you today.  Your physician wants you to follow-up in: 6 month ov You will receive a reminder letter in the mail two months in advance. If you don't receive a letter, please call our office to schedule the follow-up appointment.  

## 2012-06-15 NOTE — Progress Notes (Signed)
Patient ID: Roy [name], male   DOB: 03-04-1933, 79 y.o.   MRN: [id number] PCP: Dr. Arnoldo Morale  79 yo with history of CAD s/p PTCA D1 in 1999, CKD, diabetes, HTN, and hyperlipidemia presents for cardiology followup.  During the fall, he noted dyspnea and chest tightness with exertion.  This was slowly progressive.  He was having daily chest tightness.  Initially, I decided to to a stress test rather than cath because of his CKD.  ETT-myoview in 10/13 showed ischemic ECG changes but no TID and only a small fixed basal inferior defect with normal wall motion and EF 65%.   This was overall a low risk study.  Echo in 10/13 showed EF 55-60% with no major abnormalities.  Given ongoing symptoms, I admitted him overnight for hydration then cathed him in 12/13.  This showed nonobstructive CAD.  Exertional chest pain thought to be due to small vessel disease.  PFTs showed only mild obstructive airways disease.   Given ongoing significant symptoms, I cathed him in 12/13.  This showed nonobstructive CAD.  His symptoms were classic for angina, so may be due to small vessel disease.  Creatinine was stable post-cath.  I started him on ranolazine and he began to feel better.  He has not had any chest pain.  He is active, doing yardwork and training his dogs.  No exertional dyspnea.  Weight is down 4 lbs.   Labs (5/13): LDL 37, HDL 33 Labs (8/13): BNP 38 Labs (10/13): K 4, creatinine 2 => 1.94, BNP 53 Labs (12/13): K 4.2, creatinine 1.8 Labs (1/14): K 3.9, creatinine 2.1  PMH: 1. H/o [place] Spotted Fever in 3/13.  2. H/o Lyme disease in 2008. 3. CAD: PTCA D1 in 1999.  Adenosine myoview in (10/09) with EF 60%, no ischemia or infarction.  ETT-sestamibi (10/13) with 7'1" exercise, ischemic ECG changes, small fixed mild basal inferior defect with EF 65% and normal wall motion.  TID not elevated.  This was a low risk study overall.  Echo (10/13): EF 55-60%, normal RV size and systolic function, normal valves.  LHC  (12/13): Nonobstructive CAD, ? Small vessel disease as cause of his exertional chest pain.  Improved symptoms with ranolazine.  4. CKD 5. H/o rheumatic fever in 1951 6. Type II diabetes 7. COPD: PFTs (12/13) with mild obstructive airways disease and insignificant response to bronchodilators.   8. Hyperlipidemia 9. HTN  SH: Married, lives in [place].  Quit smoking in 1960.  Likes to hunt.    FH: CAD  ROS: All systems reviewed and negative except as per HPI.   Current Outpatient Prescriptions  Medication Sig Dispense Refill  . acetaminophen (TYLENOL) 325 MG tablet Take 650 mg by mouth every evening.      [name] aspirin EC 81 MG tablet Take 1 tablet (81 mg total) by mouth daily.      [name] atorvastatin (LIPITOR) 10 MG tablet TAKE 1 TABLET BY MOUTH ONCE A DAY  30 tablet  5  . betamethasone dipropionate (DIPROLENE) 0.05 % ointment Apply 1 application topically daily. To outside of ear      . calcium-vitamin D (OSCAL WITH D) 500-200 MG-UNIT per tablet Take 1 tablet by mouth daily.      . Cetirizine HCl (ZYRTEC PO) Take 1 tablet by mouth daily as needed. For allergies      . glimepiride (AMARYL) 2 MG tablet Take 1 tablet (2 mg total) by mouth daily before breakfast.  30 tablet  11  . isosorbide  mononitrate (IMDUR) 30 MG 24 hr tablet TAKE 3 TABLETS BY MOUTH ONCE DAILY  90 tablet  3  . loratadine (CLARITIN) 10 MG tablet Take 10 mg by mouth daily.      . metFORMIN (GLUCOPHAGE) 500 MG tablet Take 1 tablet (500 mg total) by mouth daily with breakfast.      . metFORMIN (GLUCOPHAGE-XR) 500 MG 24 hr tablet TAKE 1 TABLET BY MOUTH ONCE A DAY  30 tablet  6  . metoprolol succinate (TOPROL-XL) 50 MG 24 hr tablet Take 1 tablet (50 mg total) by mouth daily. Take with or immediately following a meal.  90 tablet  3  . mometasone-formoterol (DULERA) 100-5 MCG/ACT AERO Inhale 2 puffs into the lungs 2 (two) times daily.      . nitroGLYCERIN (NITROSTAT) 0.4 MG SL tablet Place 0.4 mg under the tongue every 5 (five)  minutes as needed. For chest pain      . Omega-3 Fatty Acids (FISH OIL PO) Take 2 capsules by mouth 2 (two) times daily.      [name] (SYSTANE OP) Place 1 drop into both eyes 4 (four) times daily. Itchy/dry eye      . potassium citrate (UROCIT-K) 10 MEQ (1080 MG) SR tablet Take 10 mEq by mouth Twice daily.      . ranitidine (ZANTAC) 300 MG tablet Take 300 mg by mouth 2 (two) times daily.      . ranolazine (RANEXA) 500 MG 12 hr tablet Take 1 tablet (500 mg total) by mouth 2 (two) times daily.  60 tablet  6  . traMADol (ULTRAM) 50 MG tablet TAKE 1 TABLET BY MOUTH WITH TYLENOL 325MG  EVERY 6 HOURS AS NEEDED FORPAIN (THIS IS A SUBSTITUTE FOR DARVOCET-N)  30 tablet  5  . traZODone (DESYREL) 150 MG tablet Take 150 mg by mouth at bedtime.      [name] zinc sulfate 220 MG capsule Take 220 mg by mouth daily.       No current facility-administered medications for this visit.    BP 140/72  Pulse 64  Ht 5\' 9"  (1.753 m)  Wt 192 lb 6.4 oz (87.272 kg)  BMI 28.4 kg/m2  SpO2 97% General: NAD Neck: No JVD, no thyromegaly or thyroid nodule.  Lungs: Clear to auscultation bilaterally with normal respiratory effort. CV: Nondisplaced PMI.  Heart regular S1/S2, no S3/S4, 1/6 SEM.  No peripheral edema.  No carotid bruit.  Normal pedal pulses.  Abdomen: Soft, nontender, no hepatosplenomegaly, no distention.  Neurologic: Alert and oriented x 3.  Psych: Normal affect. Extremities: No clubbing or cyanosis.   Assessment/Plan: 1. CAD: Nonobstructive CAD on 12/13 cath.  I think that his symptoms may have been due to small vessel disease, which could explain the ischemic ECG response to exercise with only mild perfusion abnormality.  He is doing much better with advancement of his anti-anginal regimen. He thinks that ranolazine has helped.  Continue statin, ASA 81, Toprol XL, ranolazine, and Imdur.   2. HTN: BP ok. 3. CKD: Last creatinine 2.1.  4. Hyperlipidemia: Good lipids in 1/14.  Continue  statin.    [name] 06/15/2012

## 2012-06-23 ENCOUNTER — Encounter: Payer: Self-pay | Admitting: Internal Medicine

## 2012-06-23 ENCOUNTER — Ambulatory Visit (INDEPENDENT_AMBULATORY_CARE_PROVIDER_SITE_OTHER): Payer: Medicare Other | Admitting: Internal Medicine

## 2012-06-23 VITALS — BP 130/72 | HR 72 | Temp 97.7°F | Resp 16 | Ht 69.0 in | Wt 192.0 lb

## 2012-06-23 DIAGNOSIS — J209 Acute bronchitis, unspecified: Secondary | ICD-10-CM

## 2012-06-23 DIAGNOSIS — A499 Bacterial infection, unspecified: Secondary | ICD-10-CM

## 2012-06-23 DIAGNOSIS — R490 Dysphonia: Secondary | ICD-10-CM

## 2012-06-23 DIAGNOSIS — J208 Acute bronchitis due to other specified organisms: Secondary | ICD-10-CM

## 2012-06-23 DIAGNOSIS — E119 Type 2 diabetes mellitus without complications: Secondary | ICD-10-CM

## 2012-06-23 DIAGNOSIS — I10 Essential (primary) hypertension: Secondary | ICD-10-CM

## 2012-06-23 MED ORDER — AZITHROMYCIN 250 MG PO TABS: ORAL_TABLET | ORAL | Status: DC

## 2012-06-23 MED ORDER — PSEUDOEPHED HCL-CPM-DM HBR TAN 30-4-30 MG/5ML PO SUSP: 10.0000 mL | Freq: Two times a day (BID) | ORAL | Status: DC | PRN

## 2012-06-23 NOTE — Progress Notes (Signed)
  Subjective:    Patient ID: Roy [name], male    DOB: 07/02/1932, 79 y.o.   MRN: [id number]  HPI Patient is a 79 year old male followed for diabetes and hypertension no history of allergic rhinitis with an acute history of increased hoarseness cough congestion and fever. He has noticed increased cough some shortness of breath his wife measured a temperature of 100.6. He has not noticed any changes his CBGs He is moderately short of breath   Review of Systems  Constitutional: Positive for fever and fatigue.  HENT: Positive for rhinorrhea, sneezing, postnasal drip and sinus pressure. Negative for hearing loss, congestion and neck pain.   Eyes: Negative for discharge, redness and visual disturbance.  Respiratory: Positive for cough and shortness of breath. Negative for wheezing.   Cardiovascular: Negative for leg swelling.  Gastrointestinal: Negative for abdominal pain, constipation and abdominal distention.  Genitourinary: Negative for urgency and frequency.  Musculoskeletal: Negative for joint swelling and arthralgias.  Skin: Negative for color change and rash.  Neurological: Negative for weakness and light-headedness.  Hematological: Negative for adenopathy.  Psychiatric/Behavioral: Negative for behavioral problems.       Objective:   Physical Exam  Nursing note and vitals reviewed. Constitutional: He appears well-developed and well-nourished.  HENT:  Head: Normocephalic.  Pharynx erythematous  Eyes: Conjunctivae are normal. Pupils are equal, round, and reactive to light.  Neck: Normal range of motion. Neck supple.  Cardiovascular: Normal rate and regular rhythm.   Murmur heard. Pulmonary/Chest: Effort normal.  Increased bronchial breath sounds with slight wheezes at the base  Abdominal: Soft. Bowel sounds are normal.          Assessment & Plan:  Acute bronchitis most probably bacterial in etiology following a probable viral prodrome We'll treat with azithromycin and  appropriate cough medicine Recommended allergy prophylaxis for the next 30 days of Claritin 10 mg by mouth daily

## 2012-06-23 NOTE — Patient Instructions (Addendum)
I recommended he take Claritin daily for the next month

## 2012-06-27 ENCOUNTER — Other Ambulatory Visit: Payer: Self-pay | Admitting: *Deleted

## 2012-06-27 DIAGNOSIS — R079 Chest pain, unspecified: Secondary | ICD-10-CM

## 2012-06-27 MED ORDER — RANOLAZINE ER 500 MG PO TB12: 500.0000 mg | ORAL_TABLET | Freq: Two times a day (BID) | ORAL | Status: DC

## 2012-07-04 ENCOUNTER — Other Ambulatory Visit: Payer: Self-pay | Admitting: Internal Medicine

## 2012-08-12 ENCOUNTER — Other Ambulatory Visit: Payer: Self-pay | Admitting: Internal Medicine

## 2012-08-19 ENCOUNTER — Other Ambulatory Visit: Payer: Self-pay | Admitting: Internal Medicine

## 2012-08-25 ENCOUNTER — Other Ambulatory Visit: Payer: Self-pay | Admitting: Internal Medicine

## 2012-09-01 ENCOUNTER — Ambulatory Visit (INDEPENDENT_AMBULATORY_CARE_PROVIDER_SITE_OTHER): Payer: Medicare Other | Admitting: Internal Medicine

## 2012-09-01 ENCOUNTER — Encounter: Payer: Self-pay | Admitting: Internal Medicine

## 2012-09-01 VITALS — BP 130/84 | HR 76 | Temp 98.2°F | Resp 16 | Ht 69.0 in | Wt 188.0 lb

## 2012-09-01 DIAGNOSIS — E119 Type 2 diabetes mellitus without complications: Secondary | ICD-10-CM

## 2012-09-01 DIAGNOSIS — G20 Parkinson's disease: Secondary | ICD-10-CM

## 2012-09-01 DIAGNOSIS — G20A1 Parkinson's disease without dyskinesia, without mention of fluctuations: Secondary | ICD-10-CM

## 2012-09-01 DIAGNOSIS — I10 Essential (primary) hypertension: Secondary | ICD-10-CM

## 2012-09-01 DIAGNOSIS — N182 Chronic kidney disease, stage 2 (mild): Secondary | ICD-10-CM

## 2012-09-01 LAB — BASIC METABOLIC PANEL
BUN: 26 mg/dL — ABNORMAL HIGH (ref 6–23)
Calcium: 9.7 mg/dL (ref 8.4–10.5)
Creatinine, Ser: 1.6 mg/dL — ABNORMAL HIGH (ref 0.4–1.5)
GFR: 46.34 mL/min — ABNORMAL LOW (ref >60.00)

## 2012-09-01 LAB — HEMOGLOBIN A1C: Hgb A1c MFr Bld: 6.7 % — ABNORMAL HIGH (ref 4.6–6.5)

## 2012-09-01 NOTE — Patient Instructions (Signed)
The patient is instructed to continue all medications as prescribed. Schedule followup with check out clerk upon leaving the clinic  

## 2012-09-01 NOTE — Progress Notes (Signed)
Subjective:    Patient ID: Roy [name], male    DOB: 03-04-1933, 79 y.o.   MRN: [id number]  HPI  ppatient is a 79 year old male is followed for diabetes hypertension mild COPD who presents for routine follow-up.  He has been doing well his medication he has mild to moderate diabetic peripheral neuropathy but his feet have been in good condition his wife checks them on a regular basis.  He will need an A1c today and a basic metabolic panel.  He is followed by cardiology for angina and for coronary disease he is stable at this point and has follow-up appointments with cardiology  Review of Systems  Constitutional: Positive for fatigue. Negative for fever.  HENT: Negative for hearing loss, congestion, neck pain and postnasal drip.   Eyes: Negative for discharge, redness and visual disturbance.  Respiratory: Positive for shortness of breath. Negative for cough and wheezing.   Cardiovascular: Negative for leg swelling.  Gastrointestinal: Negative for abdominal pain, constipation and abdominal distention.  Genitourinary: Negative for urgency and frequency.  Musculoskeletal: Negative for joint swelling and arthralgias.  Skin: Negative for color change and rash.  Neurological: Positive for numbness. Negative for weakness and light-headedness.  Hematological: Negative for adenopathy.  Psychiatric/Behavioral: Negative for behavioral problems.   Past Medical History  Diagnosis Date  . CAD (coronary artery disease)   . Hyperlipidemia   . Hypertension   . COPD (chronic obstructive pulmonary disease)     Stage 2  . Myocardial infarction   . Anginal pain   . Shortness of breath   . Lyme disease   . Sleep apnea     uses cpap  . Diabetes mellitus, type 2   . Heart murmur   . Fever 12/26/2011  . Chronic kidney disease     renal insufficiency  . GERD (gastroesophageal reflux disease)   . Arthritis     spine  . Chest pain on exertion 02/2012    History   Social History  . Marital  Status: Married    Spouse Name: N/A    Number of Children: N/A  . Years of Education: N/A   Occupational History  . Not on file.   Social History Main Topics  . Smoking status: Former Smoker    Quit date: 03/05/1958  . Smokeless tobacco: Never Used  . Alcohol Use: No  . Drug Use: No  . Sexually Active: Not Currently   Other Topics Concern  . Not on file   Social History Narrative  . No narrative on file    Past Surgical History  Procedure Laterality Date  . Cervical laminectomy    . Cholecystectomy    . Appendectomy    . Kidney stone surgery    . Left heart cath  02/15/12    Family History  Problem Relation Age of Onset  . ALS Father     Allergies  Allergen Reactions  . Codeine Sulfate Other (See Comments)    Chest pain  . Cephalexin Rash    Current Outpatient Prescriptions on File Prior to Visit  Medication Sig Dispense Refill  . acetaminophen (TYLENOL) 325 MG tablet Take 650 mg by mouth every evening.      [name] aspirin EC 81 MG tablet Take 1 tablet (81 mg total) by mouth daily.      [name] atorvastatin (LIPITOR) 10 MG tablet TAKE 1 TABLET BY MOUTH ONCE A DAY  30 tablet  5  . betamethasone dipropionate (DIPROLENE) 0.05 % ointment Apply 1 application topically  daily. To outside of ear      . calcium-vitamin D (OSCAL WITH D) 500-200 MG-UNIT per tablet Take 1 tablet by mouth daily.      . Cetirizine HCl (ZYRTEC PO) Take 1 tablet by mouth daily as needed. For allergies      . glimepiride (AMARYL) 2 MG tablet Take 1 tablet (2 mg total) by mouth daily before breakfast.  30 tablet  11  . isosorbide mononitrate (IMDUR) 30 MG 24 hr tablet TAKE 3 TABLETS BY MOUTH ONCE DAILY  90 tablet  6  . metFORMIN (GLUCOPHAGE-XR) 500 MG 24 hr tablet TAKE 1 TABLET BY MOUTH ONCE A DAY  30 tablet  6  . metoprolol succinate (TOPROL-XL) 50 MG 24 hr tablet Take 1 tablet (50 mg total) by mouth daily. Take with or immediately following a meal.  90 tablet  3  . mometasone-formoterol (DULERA) 100-5  MCG/ACT AERO Inhale 2 puffs into the lungs 2 (two) times daily.      . nitroGLYCERIN (NITROSTAT) 0.4 MG SL tablet Place 0.4 mg under the tongue every 5 (five) minutes as needed. For chest pain      . Omega-3 Fatty Acids (FISH OIL PO) Take 2 capsules by mouth 2 (two) times daily.      [name] (SYSTANE OP) Place 1 drop into both eyes 4 (four) times daily. Itchy/dry eye      . potassium citrate (UROCIT-K) 10 MEQ (1080 MG) SR tablet Take 10 mEq by mouth Twice daily.      . Pseudoephed HCl-CPM-DM HBr Tan 30-4-30 MG/5ML SUSP Take 10 mLs by mouth 2 (two) times daily as needed.  437 mL  0  . ranitidine (ZANTAC) 300 MG tablet TAKE 1 TABLET BY MOUTH TWICE A DAY  180 tablet  3  . ranolazine (RANEXA) 500 MG 12 hr tablet Take 1 tablet (500 mg total) by mouth 2 (two) times daily.  60 tablet  6  . traMADol (ULTRAM) 50 MG tablet TAKE 1 TABLET BY MOUTH WITH TYLENOL 325MG  EVERY 6 HOURS AS NEEDED FORPAIN (THIS IS A SUBSTITUTE FOR DARVOCET-N)  30 tablet  5  . traZODone (DESYREL) 150 MG tablet Take 150 mg by mouth at bedtime.      . traZODone (DESYREL) 150 MG tablet TAKE 1 TABLET BY MOUTH ONCE A DAY  30 tablet  5  . zinc sulfate 220 MG capsule Take 220 mg by mouth daily.       No current facility-administered medications on file prior to visit.    BP 130/84  Pulse 76  Temp(Src) 98.2 F (36.8 C)  Resp 16  Ht 5\' 9"  (1.753 m)  Wt 188 lb (85.276 kg)  BMI 27.75 kg/m2       Objective:   Physical Exam  Nursing note reviewed. Constitutional: He is oriented to person, place, and time. He appears well-developed and well-nourished.  HENT:  Head: Normocephalic and atraumatic.  Cardiovascular: Normal rate and regular rhythm.   Murmur heard. Pulmonary/Chest: Effort normal and breath sounds normal.  Abdominal: Soft. Bowel sounds are normal.  Neurological: He is alert and oriented to person, place, and time.  Skin: Skin is dry.  Psychiatric: He has a normal mood and affect.           Assessment & Plan:  10 hemoglobin A1c and basic metabolic panel today.  Stable blood pressure recheck of his blood pressure today showed the normal range he did seen the eye doctor this morning and had placed eyedrops  in size which hasn't affected the initial blood pressure.  His CBGs have been stable we'll get hemoglobin A1c today.  He is on generic Lipitor cardiologist monitored his lipids.

## 2012-09-22 ENCOUNTER — Ambulatory Visit: Payer: Medicare Other | Admitting: Internal Medicine

## 2012-11-10 ENCOUNTER — Telehealth: Payer: Self-pay | Admitting: Internal Medicine

## 2012-11-10 NOTE — Telephone Encounter (Signed)
Samples given.  

## 2012-11-10 NOTE — Telephone Encounter (Signed)
Patient Information:  Caller Name: Steward Drone  Phone: [phone]  Patient: Roy [name], Roy [name]  Gender: Male  DOB: [birth year]  Age: 79 Years  PCP: Benay Pillow (Adults only)  Office Follow Up:  Does the office need to follow up with this patient?: Yes  Instructions For The Office: PLS READ RN NOTE.  RN Note:  2nd call on 9-8 for Dulera samples. Pt has been out of Dulera for 3 weeks,states his breathing improves w/ Dulera. All emergent symptoms ruled out per Breathing Difficulty.  Pt able to speak full sentences, no audible wheezing.  PLEASE F/U W/ DR Thomasena Edis AND CALL PT BACK IF HE CAN PICK UP SAMPLES TODAY, 9-8.  Symptoms  Reason For Call & Symptoms: SOB w/ Hx of COPD  Reviewed Health History In EMR: Yes  Reviewed Medications In EMR: Yes  Reviewed Allergies In EMR: Yes  Reviewed Surgeries / Procedures: Yes  Date of Onset of Symptoms: 11/10/2012  Guideline(s) Used:  Breathing Difficulty  Disposition Per Guideline:   Go to Office Now  Reason For Disposition Reached:   Mild difficulty breathing (e.g., minimal/no SOB at rest, SOB with walking, pulse < 100) of new onset or worse than normal  Advice Given:  N/A  Patient Will Follow Care Advice:  YES

## 2012-11-10 NOTE — Telephone Encounter (Signed)
PT wife called and stated that the PT is almost out of his mometasone-formoterol (DULERA) 100-5 MCG/ACT AERO. She's inquiring to see if we have any further samples. If not, they will need an RX. Please assist.

## 2012-11-10 NOTE — Telephone Encounter (Signed)
2      Samples given to pt

## 2012-11-24 ENCOUNTER — Other Ambulatory Visit: Payer: Self-pay | Admitting: Internal Medicine

## 2012-12-10 ENCOUNTER — Encounter: Payer: Self-pay | Admitting: Cardiology

## 2012-12-10 ENCOUNTER — Ambulatory Visit (INDEPENDENT_AMBULATORY_CARE_PROVIDER_SITE_OTHER): Payer: Medicare Other | Admitting: Cardiology

## 2012-12-10 VITALS — BP 136/68 | HR 58 | Ht 69.0 in | Wt 194.0 lb

## 2012-12-10 DIAGNOSIS — E785 Hyperlipidemia, unspecified: Secondary | ICD-10-CM

## 2012-12-10 DIAGNOSIS — I2510 Atherosclerotic heart disease of native coronary artery without angina pectoris: Secondary | ICD-10-CM

## 2012-12-10 DIAGNOSIS — N189 Chronic kidney disease, unspecified: Secondary | ICD-10-CM

## 2012-12-10 NOTE — Patient Instructions (Signed)
Your physician recommends that you return for a FASTING lipid profile /BMET.  Your physician wants you to follow-up in: 6 months with Dr Aundra Dubin. (April 2015),You will receive a reminder letter in the mail two months in advance. If you don't receive a letter, please call our office to schedule the follow-up appointment.

## 2012-12-11 ENCOUNTER — Other Ambulatory Visit (INDEPENDENT_AMBULATORY_CARE_PROVIDER_SITE_OTHER): Payer: Medicare Other

## 2012-12-11 DIAGNOSIS — I2510 Atherosclerotic heart disease of native coronary artery without angina pectoris: Secondary | ICD-10-CM

## 2012-12-11 DIAGNOSIS — E785 Hyperlipidemia, unspecified: Secondary | ICD-10-CM

## 2012-12-11 LAB — BASIC METABOLIC PANEL
CO2: 28 mEq/L (ref 19–32)
Calcium: 9.8 mg/dL (ref 8.4–10.5)
Chloride: 103 mEq/L (ref 96–112)
Glucose, Bld: 156 mg/dL — ABNORMAL HIGH (ref 70–99)
Potassium: 4.3 mEq/L (ref 3.5–5.1)
Sodium: 141 mEq/L (ref 135–145)

## 2012-12-11 LAB — LIPID PANEL
Cholesterol: 119 mg/dL (ref 0–200)
HDL: 40.6 mg/dL (ref >39.00)
Total CHOL/HDL Ratio: 3

## 2012-12-11 NOTE — Progress Notes (Signed)
Patient ID: Roy [name], male   DOB: [birth year], 79 y.o.   MRN: [id number] PCP: Dr. Arnoldo Morale  79 yo with history of CAD s/p PTCA D1 in 1999, CKD, diabetes, HTN, and hyperlipidemia presents for cardiology followup.  During the fall, he noted dyspnea and chest tightness with exertion.  This was slowly progressive.  He was having daily chest tightness.  Initially, I decided to to a stress test rather than cath because of his CKD.  ETT-myoview in 10/13 showed ischemic ECG changes but no TID and only a small fixed basal inferior defect with normal wall motion and EF 65%.   This was overall a low risk study.  Echo in 10/13 showed EF 55-60% with no major abnormalities.  Given ongoing symptoms, I admitted him overnight for hydration then cathed him in 12/13.  This showed nonobstructive CAD.  Exertional chest pain thought to be due to small vessel disease.  PFTs showed only mild obstructive airways disease.   Given ongoing significant symptoms, I cathed him in 12/13.  This showed nonobstructive CAD.  His symptoms were classic for angina, so may be due to small vessel disease.  Creatinine was stable post-cath.  I started him on ranolazine and he began to feel better.  He has not had any chest pain.  He is active, doing yardwork and training his dogs.  No exertional dyspnea.  Weight is up 2 lbs.  No claudication.   Labs (5/13): LDL 37, HDL 33 Labs (8/13): BNP 38 Labs (10/13): K 4, creatinine 2 => 1.94, BNP 53 Labs (12/13): K 4.2, creatinine 1.8 Labs (1/14): K 3.9, creatinine 2.1, LDL 46 Labs (6/14): K 4.7, creatinine 1.6  ECG: NSR at 58, QT interval normal  PMH: 1. H/o [place] Spotted Fever in 3/13.  2. H/o Lyme disease in 2008. 3. CAD: PTCA D1 in 1999.  Adenosine myoview in (10/09) with EF 60%, no ischemia or infarction.  ETT-sestamibi (10/13) with 7'1" exercise, ischemic ECG changes, small fixed mild basal inferior defect with EF 65% and normal wall motion.  TID not elevated.  This was a low risk study  overall.  Echo (10/13): EF 55-60%, normal RV size and systolic function, normal valves.  LHC (12/13): Nonobstructive CAD, ? Small vessel disease as cause of his exertional chest pain.  Improved symptoms with ranolazine.  4. CKD 5. H/o rheumatic fever in 1951 6. Type II diabetes 7. COPD: PFTs (12/13) with mild obstructive airways disease and insignificant response to bronchodilators.   8. Hyperlipidemia 9. HTN  SH: Married, lives in [place].  Quit smoking in 1960.  Likes to hunt.    FH: CAD  Current Outpatient Prescriptions  Medication Sig Dispense Refill  . acetaminophen (TYLENOL) 325 MG tablet Take 650 mg by mouth every evening.      [name] aspirin EC 81 MG tablet Take 1 tablet (81 mg total) by mouth daily.      [name] atorvastatin (LIPITOR) 10 MG tablet TAKE 1 TABLET BY MOUTH ONCE A DAY  30 tablet  5  . betamethasone dipropionate (DIPROLENE) 0.05 % ointment Apply 1 application topically daily. To outside of ear      . calcium-vitamin D (OSCAL WITH D) 500-200 MG-UNIT per tablet Take 1 tablet by mouth daily.      . Cetirizine HCl (ZYRTEC PO) Take 1 tablet by mouth daily as needed. For allergies      . glimepiride (AMARYL) 2 MG tablet Take 1 tablet (2 mg total) by mouth daily before breakfast.  30 tablet  11  . isosorbide mononitrate (IMDUR) 30 MG 24 hr tablet TAKE 3 TABLETS BY MOUTH ONCE DAILY  90 tablet  6  . metFORMIN (GLUCOPHAGE-XR) 500 MG 24 hr tablet TAKE 1 TABLET BY MOUTH ONCE A DAY  90 tablet  3  . metoprolol succinate (TOPROL-XL) 50 MG 24 hr tablet Take 1 tablet (50 mg total) by mouth daily. Take with or immediately following a meal.  90 tablet  3  . mometasone-formoterol (DULERA) 100-5 MCG/ACT AERO Inhale 2 puffs into the lungs 2 (two) times daily.      . nitroGLYCERIN (NITROSTAT) 0.4 MG SL tablet Place 0.4 mg under the tongue every 5 (five) minutes as needed. For chest pain      . Omega-3 Fatty Acids (FISH OIL PO) Take 2 capsules by mouth 2 (two) times daily.      Vladimir Faster  Glycol-Propyl Glycol (SYSTANE OP) Place 1 drop into both eyes 4 (four) times daily. Itchy/dry eye      . potassium citrate (UROCIT-K) 10 MEQ (1080 MG) SR tablet Take 10 mEq by mouth Twice daily.      . Pseudoephed HCl-CPM-DM HBr Tan 30-4-30 MG/5ML SUSP Take 10 mLs by mouth 2 (two) times daily as needed.  437 mL  0  . ranitidine (ZANTAC) 300 MG tablet TAKE 1 TABLET BY MOUTH TWICE A DAY  180 tablet  3  . ranolazine (RANEXA) 500 MG 12 hr tablet Take 1 tablet (500 mg total) by mouth 2 (two) times daily.  60 tablet  6  . traMADol (ULTRAM) 50 MG tablet TAKE 1 TABLET BY MOUTH WITH TYLENOL 325MG  EVERY 6 HOURS AS NEEDED FORPAIN (THIS IS A SUBSTITUTE FOR DARVOCET-N)  30 tablet  5  . traZODone (DESYREL) 150 MG tablet Take 150 mg by mouth at bedtime.      . traZODone (DESYREL) 150 MG tablet TAKE 1 TABLET BY MOUTH ONCE A DAY  30 tablet  5  . zinc sulfate 220 MG capsule Take 220 mg by mouth daily.       No current facility-administered medications for this visit.    BP 136/68  Pulse 58  Ht 5\' 9"  (1.753 m)  Wt 194 lb (87.998 kg)  BMI 28.64 kg/m2 General: NAD Neck: No JVD, no thyromegaly or thyroid nodule.  Lungs: Clear to auscultation bilaterally with normal respiratory effort. CV: Nondisplaced PMI.  Heart regular S1/S2, no S3/S4, 1/6 SEM.  No peripheral edema.  No carotid bruit.  Normal pedal pulses.  Abdomen: Soft, nontender, no hepatosplenomegaly, no distention.  Neurologic: Alert and oriented x 3.  Psych: Normal affect. Extremities: No clubbing or cyanosis.   Assessment/Plan: 1. CAD: Nonobstructive CAD on 12/13 cath.  I think that his symptoms may have been due to small vessel disease, which could explain the ischemic ECG response to exercise with only mild perfusion abnormality.  He is doing much better with advancement of his anti-anginal regimen. He thinks that ranolazine has helped.  Continue statin, ASA 81, Toprol XL, ranolazine, and Imdur.   QT normal on ranolazine today.  2. HTN: BP ok. 3.  CKD: Last creatinine 1.6 (baseline).  BMET today.  4. Hyperlipidemia: Check lipids today.    [name] 12/11/2012

## 2012-12-15 ENCOUNTER — Other Ambulatory Visit: Payer: Self-pay | Admitting: Internal Medicine

## 2012-12-31 ENCOUNTER — Other Ambulatory Visit: Payer: Self-pay

## 2012-12-31 DIAGNOSIS — R079 Chest pain, unspecified: Secondary | ICD-10-CM

## 2012-12-31 DIAGNOSIS — I2510 Atherosclerotic heart disease of native coronary artery without angina pectoris: Secondary | ICD-10-CM

## 2012-12-31 MED ORDER — METOPROLOL SUCCINATE ER 50 MG PO TB24: 50.0000 mg | ORAL_TABLET | Freq: Every day | ORAL | Status: DC

## 2013-01-02 ENCOUNTER — Ambulatory Visit (INDEPENDENT_AMBULATORY_CARE_PROVIDER_SITE_OTHER): Payer: Medicare Other | Admitting: Internal Medicine

## 2013-01-02 ENCOUNTER — Encounter: Payer: Self-pay | Admitting: Internal Medicine

## 2013-01-02 VITALS — BP 140/74 | HR 72 | Temp 97.8°F | Resp 16 | Ht 69.0 in | Wt 196.0 lb

## 2013-01-02 DIAGNOSIS — L209 Atopic dermatitis, unspecified: Secondary | ICD-10-CM

## 2013-01-02 DIAGNOSIS — B9689 Other specified bacterial agents as the cause of diseases classified elsewhere: Secondary | ICD-10-CM

## 2013-01-02 DIAGNOSIS — A499 Bacterial infection, unspecified: Secondary | ICD-10-CM

## 2013-01-02 DIAGNOSIS — G20A1 Parkinson's disease without dyskinesia, without mention of fluctuations: Secondary | ICD-10-CM

## 2013-01-02 DIAGNOSIS — J449 Chronic obstructive pulmonary disease, unspecified: Secondary | ICD-10-CM

## 2013-01-02 DIAGNOSIS — L2089 Other atopic dermatitis: Secondary | ICD-10-CM

## 2013-01-02 DIAGNOSIS — G20 Parkinson's disease: Secondary | ICD-10-CM

## 2013-01-02 DIAGNOSIS — E785 Hyperlipidemia, unspecified: Secondary | ICD-10-CM

## 2013-01-02 DIAGNOSIS — J209 Acute bronchitis, unspecified: Secondary | ICD-10-CM

## 2013-01-02 DIAGNOSIS — E119 Type 2 diabetes mellitus without complications: Secondary | ICD-10-CM

## 2013-01-02 DIAGNOSIS — J4489 Other specified chronic obstructive pulmonary disease: Secondary | ICD-10-CM

## 2013-01-02 LAB — LIPID PANEL
HDL: 41.4 mg/dL (ref >39.00)
Total CHOL/HDL Ratio: 3
VLDL: 25.8 mg/dL (ref 0.0–40.0)

## 2013-01-02 LAB — BASIC METABOLIC PANEL
CO2: 30 mEq/L (ref 19–32)
Calcium: 9.3 mg/dL (ref 8.4–10.5)
Chloride: 102 mEq/L (ref 96–112)
GFR: 47 mL/min — ABNORMAL LOW (ref >60.00)
Glucose, Bld: 144 mg/dL — ABNORMAL HIGH (ref 70–99)
Potassium: 4.7 mEq/L (ref 3.5–5.1)
Sodium: 140 mEq/L (ref 135–145)

## 2013-01-02 LAB — HEPATIC FUNCTION PANEL
Total Bilirubin: 0.6 mg/dL (ref 0.3–1.2)
Total Protein: 7.3 g/dL (ref 6.0–8.3)

## 2013-01-02 MED ORDER — BETAMETHASONE DIPROPIONATE 0.05 % EX OINT: 1.0000 "application " | TOPICAL_OINTMENT | Freq: Every day | CUTANEOUS | Status: DC

## 2013-01-02 MED ORDER — MOXIFLOXACIN HCL 400 MG PO TABS: 400.0000 mg | ORAL_TABLET | Freq: Every day | ORAL | Status: DC

## 2013-01-02 NOTE — Patient Instructions (Signed)
The patient is instructed to continue all medications as prescribed. Schedule followup with check out clerk upon leaving the clinic  Complete the antibiotics and call if not improve

## 2013-01-02 NOTE — Progress Notes (Signed)
Subjective:    Patient ID: Roy [name], male    DOB: [birth year], 79 y.o.   MRN: [id number]  HPI Patient is a 79 year old male followed for diabetes hyperlipidemia hypertension and heart disease also followed with cardiology.  Patient states that this week he developed an upper respiratory tract infection with symptomology including low-grade fever cough and now the cough has become productive with colored sputum.  He has noticed a transformation of the sputum to a darker color  Stable CBG's Stable HTN  Review of Systems  Constitutional: Positive for fever and fatigue.  HENT: Negative for congestion, hearing loss and postnasal drip.   Eyes: Negative for discharge, redness and visual disturbance.  Respiratory: Positive for cough, shortness of breath and wheezing.   Cardiovascular: Negative for leg swelling.  Gastrointestinal: Negative for abdominal pain, constipation and abdominal distention.  Genitourinary: Negative for urgency and frequency.  Musculoskeletal: Negative for arthralgias, joint swelling and neck pain.  Skin: Negative for color change and rash.  Neurological: Negative for weakness and light-headedness.  Hematological: Negative for adenopathy.  Psychiatric/Behavioral: Negative for behavioral problems.   Past Medical History  Diagnosis Date  . CAD (coronary artery disease)   . Hyperlipidemia   . Hypertension   . COPD (chronic obstructive pulmonary disease)     Stage 2  . Myocardial infarction   . Anginal pain   . Shortness of breath   . Lyme disease   . Sleep apnea     uses cpap  . Diabetes mellitus, type 2   . Heart murmur   . Fever 12/26/2011  . Chronic kidney disease     renal insufficiency  . GERD (gastroesophageal reflux disease)   . Arthritis     spine  . Chest pain on exertion 02/2012    History   Social History  . Marital Status: Married    Spouse Name: N/A    Number of Children: N/A  . Years of Education: N/A   Occupational History  .  Not on file.   Social History Main Topics  . Smoking status: Former Smoker    Quit date: 03/05/1958  . Smokeless tobacco: Never Used  . Alcohol Use: No  . Drug Use: No  . Sexual Activity: Not Currently   Other Topics Concern  . Not on file   Social History Narrative  . No narrative on file    Past Surgical History  Procedure Laterality Date  . Cervical laminectomy    . Cholecystectomy    . Appendectomy    . Kidney stone surgery    . Left heart cath  02/15/12    Family History  Problem Relation Age of Onset  . ALS Father     Allergies  Allergen Reactions  . Codeine Sulfate Other (See Comments)    Chest pain  . Cephalexin Rash    Current Outpatient Prescriptions on File Prior to Visit  Medication Sig Dispense Refill  . acetaminophen (TYLENOL) 325 MG tablet Take 650 mg by mouth every evening.      [name] aspirin EC 81 MG tablet Take 1 tablet (81 mg total) by mouth daily.      [name] atorvastatin (LIPITOR) 10 MG tablet TAKE 1 TABLET BY MOUTH ONCE A DAY  30 tablet  5  . betamethasone dipropionate (DIPROLENE) 0.05 % ointment Apply 1 application topically daily. To outside of ear      . calcium-vitamin D (OSCAL WITH D) 500-200 MG-UNIT per tablet Take 1 tablet by mouth daily.      [name]  Cetirizine HCl (ZYRTEC PO) Take 1 tablet by mouth daily as needed. For allergies      . glimepiride (AMARYL) 2 MG tablet TAKE 1 TABLET BY MOUTH ONCE A DAY BEFOREBREAKFAST  30 tablet  11  . isosorbide mononitrate (IMDUR) 30 MG 24 hr tablet TAKE 3 TABLETS BY MOUTH ONCE DAILY  90 tablet  6  . metFORMIN (GLUCOPHAGE-XR) 500 MG 24 hr tablet TAKE 1 TABLET BY MOUTH ONCE A DAY  90 tablet  3  . metoprolol succinate (TOPROL-XL) 50 MG 24 hr tablet Take 1 tablet (50 mg total) by mouth daily. Take with or immediately following a meal.  90 tablet  3  . mometasone-formoterol (DULERA) 100-5 MCG/ACT AERO Inhale 2 puffs into the lungs 2 (two) times daily.      . nitroGLYCERIN (NITROSTAT) 0.4 MG SL tablet Place 0.4 mg under  the tongue every 5 (five) minutes as needed. For chest pain      . Omega-3 Fatty Acids (FISH OIL PO) Take 2 capsules by mouth 2 (two) times daily.      [name] (SYSTANE OP) Place 1 drop into both eyes 4 (four) times daily. Itchy/dry eye      . potassium citrate (UROCIT-K) 10 MEQ (1080 MG) SR tablet Take 10 mEq by mouth Twice daily.      . Pseudoephed HCl-CPM-DM HBr Tan 30-4-30 MG/5ML SUSP Take 10 mLs by mouth 2 (two) times daily as needed.  437 mL  0  . ranitidine (ZANTAC) 300 MG tablet TAKE 1 TABLET BY MOUTH TWICE A DAY  180 tablet  3  . ranolazine (RANEXA) 500 MG 12 hr tablet Take 1 tablet (500 mg total) by mouth 2 (two) times daily.  60 tablet  6  . traMADol (ULTRAM) 50 MG tablet TAKE 1 TABLET BY MOUTH WITH TYLENOL 325MG  EVERY 6 HOURS AS NEEDED FORPAIN (THIS IS A SUBSTITUTE FOR DARVOCET-N)  30 tablet  5  . traZODone (DESYREL) 150 MG tablet Take 150 mg by mouth at bedtime.      . traZODone (DESYREL) 150 MG tablet TAKE 1 TABLET BY MOUTH ONCE A DAY  30 tablet  5  . zinc sulfate 220 MG capsule Take 220 mg by mouth daily.       No current facility-administered medications on file prior to visit.    BP 140/74  Pulse 72  Temp(Src) 97.8 F (36.6 C)  Resp 16  Ht 5\' 9"  (1.753 m)  Wt 196 lb (88.905 kg)  BMI 28.93 kg/m2        Objective:   Physical Exam  Nursing note and vitals reviewed. Constitutional: He appears well-developed and well-nourished.  HENT:  Head: Normocephalic and atraumatic.  Pharyngitis  Eyes: Conjunctivae are normal. Pupils are equal, round, and reactive to light.  Neck: Normal range of motion. Neck supple.  Cardiovascular: Normal rate and regular rhythm.   Murmur heard. Pulmonary/Chest: Effort normal.  Increased bronchial breath sounds without rales  Abdominal: Soft. Bowel sounds are normal.  Skin: Skin is warm and dry.          Assessment & Plan:  Lung fields are clear leaves of the active diagnosis of bacterial bronchitis given  his history of underlying COPD I would recommend that we treat this with an antibiotic  Will use Avelox 400 mg by mouth daily for 7 days   Monitor CBg's Check a1c Stable COPD on maintenance inhaler Stable hypertension

## 2013-01-03 ENCOUNTER — Other Ambulatory Visit: Payer: Self-pay | Admitting: Internal Medicine

## 2013-01-05 LAB — HEMOGLOBIN A1C: Hgb A1c MFr Bld: 6.9 % — ABNORMAL HIGH (ref 4.6–6.5)

## 2013-01-13 ENCOUNTER — Ambulatory Visit (INDEPENDENT_AMBULATORY_CARE_PROVIDER_SITE_OTHER): Payer: Medicare Other

## 2013-01-13 DIAGNOSIS — Z23 Encounter for immunization: Secondary | ICD-10-CM

## 2013-01-31 ENCOUNTER — Other Ambulatory Visit: Payer: Self-pay | Admitting: Internal Medicine

## 2013-02-06 ENCOUNTER — Other Ambulatory Visit: Payer: Self-pay

## 2013-02-06 DIAGNOSIS — R079 Chest pain, unspecified: Secondary | ICD-10-CM

## 2013-02-06 MED ORDER — RANOLAZINE ER 500 MG PO TB12: 500.0000 mg | ORAL_TABLET | Freq: Two times a day (BID) | ORAL | Status: DC

## 2013-02-09 ENCOUNTER — Other Ambulatory Visit: Payer: Self-pay | Admitting: Internal Medicine

## 2013-03-03 ENCOUNTER — Emergency Department (INDEPENDENT_AMBULATORY_CARE_PROVIDER_SITE_OTHER): Payer: Medicare Other

## 2013-03-03 ENCOUNTER — Encounter (HOSPITAL_COMMUNITY): Payer: Self-pay | Admitting: Emergency Medicine

## 2013-03-03 ENCOUNTER — Telehealth: Payer: Self-pay | Admitting: Internal Medicine

## 2013-03-03 ENCOUNTER — Emergency Department (HOSPITAL_COMMUNITY)
Admission: EM | Admit: 2013-03-03 | Discharge: 2013-03-03 | Disposition: A | Discharge status: Home / Self Care | Payer: Medicare Other | Source: Home / Self Care | Attending: Emergency Medicine | Admitting: Emergency Medicine

## 2013-03-03 DIAGNOSIS — J069 Acute upper respiratory infection, unspecified: Secondary | ICD-10-CM

## 2013-03-03 DIAGNOSIS — J329 Chronic sinusitis, unspecified: Secondary | ICD-10-CM

## 2013-03-03 DIAGNOSIS — J441 Chronic obstructive pulmonary disease with (acute) exacerbation: Secondary | ICD-10-CM

## 2013-03-03 LAB — CBC WITH DIFFERENTIAL/PLATELET
Basophils Relative: 0 % (ref 0–1)
Eosinophils Absolute: 0.7 10*3/uL (ref 0.0–0.7)
Eosinophils Relative: 10 % — ABNORMAL HIGH (ref 0–5)
Hemoglobin: 14.3 g/dL (ref 13.0–17.0)
Lymphs Abs: 1.3 10*3/uL (ref 0.7–4.0)
MCH: 30.4 pg (ref 26.0–34.0)
MCHC: 34.6 g/dL (ref 30.0–36.0)
MCV: 87.7 fL (ref 78.0–100.0)
Monocytes Relative: 16 % — ABNORMAL HIGH (ref 3–12)
Neutro Abs: 4.2 10*3/uL (ref 1.7–7.7)
Neutrophils Relative %: 57 % (ref 43–77)
RBC: 4.71 MIL/uL (ref 4.22–5.81)

## 2013-03-03 LAB — POCT I-STAT, CHEM 8
Calcium, Ion: 1.19 mmol/L (ref 1.13–1.30)
Chloride: 100 mEq/L (ref 96–112)
Glucose, Bld: 152 mg/dL — ABNORMAL HIGH (ref 70–99)
HCT: 45 % (ref 39.0–52.0)
Potassium: 4.1 mEq/L (ref 3.7–5.3)
Sodium: 139 mEq/L (ref 137–147)

## 2013-03-03 MED ORDER — LEVOFLOXACIN 750 MG PO TABS: 750.0000 mg | ORAL_TABLET | Freq: Every day | ORAL | Status: DC

## 2013-03-03 MED ORDER — METHYLPREDNISOLONE 4 MG PO KIT: PACK | ORAL | Status: DC

## 2013-03-03 MED ORDER — FLUTICASONE PROPIONATE 50 MCG/ACT NA SUSP: 2.0000 | Freq: Two times a day (BID) | NASAL | Status: DC

## 2013-03-03 MED ORDER — PREDNISONE 50 MG PO TABS: 50.0000 mg | ORAL_TABLET | Freq: Every day | ORAL | Status: DC

## 2013-03-03 MED ORDER — BENZONATATE 200 MG PO CAPS: 200.0000 mg | ORAL_CAPSULE | Freq: Three times a day (TID) | ORAL | Status: DC | PRN

## 2013-03-03 MED ORDER — MOXIFLOXACIN HCL 400 MG PO TABS: 400.0000 mg | ORAL_TABLET | Freq: Every day | ORAL | Status: DC

## 2013-03-03 NOTE — Telephone Encounter (Signed)
Patient Information:  Caller Name: Steward Drone  Phone: [phone]  Patient: Roy [name], Roy [name]  Gender: Male  DOB: 03-Jan-1933  Age: 79 Years  PCP: Benay Pillow (Adults only)  Office Follow Up:  Does the office need to follow up with this patient?: No  Instructions For The Office: N/A  RN Note:  Spouse states patient developed fever, sore throat, cough, chills aching. Onset 03/01/13. States patient is expectorating yellow/green sputum. Spouse states cough and chills are the worst of the sx. No wheezing. Care advice given per guidelines. Spouse advised Tylenol 650mg . q 4 hours as needed for fever. Increased fluids, inhaled steam, saline nasal washes, humidifier. Call back parameters reviewed. Spouse verbalizes understanding. No appts. available in [place] [facility]. RN spoke with Livia Snellen, in office. Orders received: Patient to be evaluated in the Urgent Care.  Spouse/ [name] informed of above. Spouse verbalizes understanding and agreeble. States she will take patient to [facility] for evaluation.    Symptoms  Reason For Call & Symptoms: Fever, aching, cough, sore throat  Reviewed Health History In EMR: Yes  Reviewed Medications In EMR: Yes  Reviewed Allergies In EMR: Yes  Reviewed Surgeries / Procedures: Yes  Date of Onset of Symptoms: 03/01/2013  Treatments Tried: Mucinex, Acetaminophen  Treatments Tried Worked: No  Any Fever: Yes  Fever Taken: Oral  Fever Time Of Reading: 07:00:00  Fever Last Reading: 100.9  Guideline(s) Used:  Influenza - Seasonal  Cough  Disposition Per Guideline:   Go to Office Now  Reason For Disposition Reached:   Fever > 100.5 F (38.1 C) and over 53 years of age  Advice Given:  Coughing Spasms:  Drink warm fluids. Inhale warm mist (Reason: both relax the airway and loosen up the phlegm).  Avoid Tobacco Smoke:  Smoking or being exposed to smoke makes coughs much worse.  Prevent Dehydration:  Drink adequate liquids.  This  will help soothe an irritated or dry throat and loosen up the phlegm.  Fever Medicines:  For fevers above 101 F (38.3 C) take either acetaminophen or ibuprofen.  Call Back If:  Difficulty breathing  You become worse.  For a Stuffy Nose - Use Nasal Washes:  Introduction: Saline (salt water) nasal irrigation (nasal wash) is an effective and simple home remedy for treating stuffy nose and sinus congestion. The nose can be irrigated by pouring, spraying, or squirting salt water into the nose and then letting it run back out.  For a Stuffy Nose - Use Nasal Washes:  Introduction: Saline (salt water) nasal irrigation (nasal wash) is an effective and simple home remedy for treating stuffy nose and sinus congestion. The nose can be irrigated by pouring, spraying, or squirting salt water into the nose and then letting it run back out.  How it Helps: The salt water rinses out excess mucus, washes out any irritants (dust, allergens) that might be present, and moistens the nasal cavity.  Patient Will Follow Care Advice:  YES

## 2013-03-03 NOTE — ED Notes (Signed)
C/o cough with brown-colored mucous, fever, chills, and nausea that started 2 days ago. Stated that his temp was 100.9 at 7 am, took some medication. Stated he has taken Mucinex Fast-Max and Tylenol with no sighs of relief, except for the brown-colored mucous. Denies any v/d.  Written by: Lenore Manner

## 2013-03-03 NOTE — ED Provider Notes (Signed)
CSN: [id number]     Arrival date & time 03/03/13  1017 History   None    Chief Complaint  Patient presents with  . URI   (Consider location/radiation/quality/duration/timing/severity/associated sxs/prior Treatment) HPI Comments: 79 year old male with a history of type 2 diabetes, coronary artery disease, stage II chronic kidney disease, COPD presents complaining of productive cough, fever, chills, nausea, and, mild shortness of breath on exertion that began 2 days ago. He says that his temperature was 100.9 this morning and has responded well to Tylenol. He is drinking a normal amount and is urinating a normal amount. He denies chest pain, shortness of breath, vomiting, diarrhea. No recent travel or sick contacts.   Patient is a 79 y.o. male presenting with URI.  URI Presenting symptoms: cough, fatigue and fever   Presenting symptoms: no ear pain and no sore throat   Associated symptoms: no arthralgias, no myalgias and no neck pain     Past Medical History  Diagnosis Date  . CAD (coronary artery disease)   . Hyperlipidemia   . Hypertension   . COPD (chronic obstructive pulmonary disease)     Stage 2  . Myocardial infarction   . Anginal pain   . Shortness of breath   . Lyme disease   . Sleep apnea     uses cpap  . Diabetes mellitus, type 2   . Heart murmur   . Fever 12/26/2011  . Chronic kidney disease     renal insufficiency  . GERD (gastroesophageal reflux disease)   . Arthritis     spine  . Chest pain on exertion 02/2012   Past Surgical History  Procedure Laterality Date  . Cervical laminectomy    . Cholecystectomy    . Appendectomy    . Kidney stone surgery    . Left heart cath  02/15/12   Family History  Problem Relation Age of Onset  . ALS Father    History  Substance Use Topics  . Smoking status: Former Smoker    Quit date: 03/05/1958  . Smokeless tobacco: Never Used  . Alcohol Use: No    Review of Systems  Constitutional: Positive for fever,  chills and fatigue.  HENT: Negative for ear pain, sinus pressure and sore throat.   Eyes: Negative for visual disturbance.  Respiratory: Positive for cough and shortness of breath (on exertion).   Cardiovascular: Negative for chest pain, palpitations and leg swelling.  Gastrointestinal: Positive for nausea. Negative for vomiting, abdominal pain, diarrhea and constipation.  Genitourinary: Negative for dysuria, urgency, frequency and hematuria.  Musculoskeletal: Negative for arthralgias, myalgias, neck pain and neck stiffness.  Skin: Negative for rash.  Neurological: Negative for dizziness, weakness and light-headedness.    Allergies  Codeine sulfate and Cephalexin  Home Medications   Current Outpatient Rx  Name  Route  Sig  Dispense  Refill  . glimepiride (AMARYL) 2 MG tablet      TAKE 1 TABLET BY MOUTH ONCE A DAY BEFOREBREAKFAST   30 tablet   11   . isosorbide mononitrate (IMDUR) 30 MG 24 hr tablet      TAKE 3 TABLETS BY MOUTH ONCE DAILY   90 tablet   0   . metFORMIN (GLUCOPHAGE-XR) 500 MG 24 hr tablet      TAKE 1 TABLET BY MOUTH ONCE A DAY   90 tablet   3   . metoprolol succinate (TOPROL-XL) 50 MG 24 hr tablet   Oral   Take 1 tablet (50 mg total) by  mouth daily. Take with or immediately following a meal.   90 tablet   3   . potassium citrate (UROCIT-K) 10 MEQ (1080 MG) SR tablet   Oral   Take 10 mEq by mouth Twice daily.         . ranitidine (ZANTAC) 300 MG tablet      TAKE 1 TABLET BY MOUTH TWICE A DAY   180 tablet   3   . ranolazine (RANEXA) 500 MG 12 hr tablet   Oral   Take 1 tablet (500 mg total) by mouth 2 (two) times daily.   60 tablet   6   . traZODone (DESYREL) 150 MG tablet      TAKE 1 TABLET BY MOUTH ONCE A DAY   30 tablet   5   . traZODone (DESYREL) 150 MG tablet      TAKE 1 TABLET BY MOUTH ONCE A DAY   30 tablet   6   . acetaminophen (TYLENOL) 325 MG tablet   Oral   Take 650 mg by mouth every evening.         [name] aspirin EC 81  MG tablet   Oral   Take 1 tablet (81 mg total) by mouth daily.         [name] atorvastatin (LIPITOR) 10 MG tablet      TAKE 1 TABLET BY MOUTH ONCE A DAY   30 tablet   5   . benzonatate (TESSALON) 200 MG capsule   Oral   Take 1 capsule (200 mg total) by mouth 3 (three) times daily as needed for cough.   20 capsule   0   . betamethasone dipropionate (DIPROLENE) 0.05 % ointment   Topical   Apply 1 application topically daily. To outside of ear   30 g   4   . calcium-vitamin D (OSCAL WITH D) 500-200 MG-UNIT per tablet   Oral   Take 1 tablet by mouth daily.         . Cetirizine HCl (ZYRTEC PO)   Oral   Take 1 tablet by mouth daily as needed. For allergies         . mometasone-formoterol (DULERA) 100-5 MCG/ACT AERO   Inhalation   Inhale 2 puffs into the lungs 2 (two) times daily.         [name] moxifloxacin (AVELOX) 400 MG tablet   Oral   Take 1 tablet (400 mg total) by mouth daily.   7 tablet   0   . moxifloxacin (AVELOX) 400 MG tablet   Oral   Take 1 tablet (400 mg total) by mouth daily.   5 tablet   0   . nitroGLYCERIN (NITROSTAT) 0.4 MG SL tablet   Sublingual   Place 0.4 mg under the tongue every 5 (five) minutes as needed. For chest pain         . Omega-3 Fatty Acids (FISH OIL PO)   Oral   Take 2 capsules by mouth 2 (two) times daily.         [name] (SYSTANE OP)   Both Eyes   Place 1 drop into both eyes 4 (four) times daily. Itchy/dry eye         . predniSONE (DELTASONE) 50 MG tablet   Oral   Take 1 tablet (50 mg total) by mouth daily with breakfast.   5 tablet   0   . Pseudoephed HCl-CPM-DM HBr Tan 30-4-30 MG/5ML SUSP   Oral   Take  10 mLs by mouth 2 (two) times daily as needed.   437 mL   0   . traMADol (ULTRAM) 50 MG tablet      TAKE 1 TABLET BY MOUTH WITH TYLENOL 325MG  EVERY 6 HOURS AS NEEDED FORPAIN (THIS IS A SUBSTITUTE FOR DARVOCET-N )   30 tablet   3   . traZODone (DESYREL) 150 MG tablet   Oral   Take  150 mg by mouth at bedtime.         [name] zinc sulfate 220 MG capsule   Oral   Take 220 mg by mouth daily.          BP 130/68  Pulse 70  Temp(Src) 98.2 F (36.8 C) (Oral)  Resp 20  SpO2 99% Physical Exam  Nursing note and vitals reviewed. Constitutional: He is oriented to person, place, and time. He appears well-developed and well-nourished. No distress.  HENT:  Head: Normocephalic and atraumatic.  Right Ear: External ear normal.  Left Ear: External ear normal.  Nose: Nose normal.  Mouth/Throat: Oropharynx is clear and moist. No oropharyngeal exudate.  Neck: Normal range of motion. Neck supple. No JVD present. No tracheal deviation present.  Cardiovascular: Normal rate, regular rhythm and normal heart sounds.  Exam reveals no gallop and no friction rub.   No murmur heard. Pulmonary/Chest: Effort normal and breath sounds normal. No respiratory distress. He has no wheezes. He has no rales.  Abdominal: Soft.  Lymphadenopathy:    He has no cervical adenopathy.  Neurological: He is alert and oriented to person, place, and time. Coordination normal.  Skin: Skin is warm and dry. No rash noted. He is not diaphoretic.  Psychiatric: He has a normal mood and affect. Judgment normal.    ED Course  Procedures (including critical care time) Labs Review Labs Reviewed  CBC WITH DIFFERENTIAL - Abnormal; Notable for the following:    Monocytes Relative 16 (*)    Monocytes Absolute 1.2 (*)    Eosinophils Relative 10 (*)    All other components within normal limits  POCT I-STAT, CHEM 8 - Abnormal; Notable for the following:    BUN 27 (*)    Creatinine, Ser 1.80 (*)    Glucose, Bld 152 (*)    All other components within normal limits   Imaging Review Dg Chest 2 View  03/03/2013   CLINICAL DATA:  Cough, chills, and fever for 3 days  EXAM: CHEST  2 VIEW  COMPARISON:  12/26/2011  FINDINGS: Heart size upper normal. Normal vascular pattern. Lungs clear. No pleural effusions. Old lateral  right rib fractures, stable.  IMPRESSION: No active cardiopulmonary disease.   Electronically Signed   By: Skipper Cliche M.D.   On: 03/03/2013 14:05      MDM   1. URI (upper respiratory infection)   2. COPD exacerbation    CXR normal.  No CBC relatively normal.  NAD.  Treat for mild-moderate COPD exacerbation, ED if worsening, f/u here or with PCP on Friday to recheck Cr++ bc slightly elevated over baseline.  Use inhaler PRN for SOB or wheezing.     Meds ordered this encounter  Medications  . moxifloxacin (AVELOX) 400 MG tablet    Sig: Take 1 tablet (400 mg total) by mouth daily.    Dispense:  5 tablet    Refill:  0    Order Specific Question:  Supervising Provider    Answer:  Jake Michaelis, DAVID C [id number]  . predniSONE (DELTASONE) 50 MG tablet  Sig: Take 1 tablet (50 mg total) by mouth daily with breakfast.    Dispense:  5 tablet    Refill:  0    Order Specific Question:  Supervising Provider    Answer:  Jake Michaelis, DAVID C [id number]  . benzonatate (TESSALON) 200 MG capsule    Sig: Take 1 capsule (200 mg total) by mouth 3 (three) times daily as needed for cough.    Dispense:  20 capsule    Refill:  0    Order Specific Question:  Supervising Provider    Answer:  Jake Michaelis, DAVID C [place], PA-C 03/03/13 [phone]

## 2013-03-03 NOTE — Telephone Encounter (Signed)
Noted  

## 2013-03-03 NOTE — ED Provider Notes (Signed)
Medical screening examination/treatment/procedure(s) were performed by non-physician practitioner and as supervising physician I was immediately available for consultation/collaboration.  Philipp Deputy, M.D.   Harden Mo, MD 03/03/13 [phone]

## 2013-03-03 NOTE — ED Notes (Signed)
Waiting discharge and blood work.

## 2013-03-06 ENCOUNTER — Encounter (HOSPITAL_COMMUNITY): Payer: Self-pay | Admitting: Emergency Medicine

## 2013-03-06 ENCOUNTER — Emergency Department (HOSPITAL_COMMUNITY)
Admission: EM | Admit: 2013-03-06 | Discharge: 2013-03-06 | Disposition: A | Discharge status: Home / Self Care | Payer: Medicare HMO | Source: Home / Self Care

## 2013-03-06 DIAGNOSIS — E119 Type 2 diabetes mellitus without complications: Secondary | ICD-10-CM

## 2013-03-06 DIAGNOSIS — N058 Unspecified nephritic syndrome with other morphologic changes: Secondary | ICD-10-CM

## 2013-03-06 DIAGNOSIS — E1121 Type 2 diabetes mellitus with diabetic nephropathy: Secondary | ICD-10-CM

## 2013-03-06 DIAGNOSIS — J441 Chronic obstructive pulmonary disease with (acute) exacerbation: Secondary | ICD-10-CM

## 2013-03-06 DIAGNOSIS — J9801 Acute bronchospasm: Secondary | ICD-10-CM

## 2013-03-06 DIAGNOSIS — E1129 Type 2 diabetes mellitus with other diabetic kidney complication: Secondary | ICD-10-CM

## 2013-03-06 LAB — POCT I-STAT, CHEM 8
BUN: 31 mg/dL — ABNORMAL HIGH (ref 6–23)
Calcium, Ion: 1.24 mmol/L (ref 1.13–1.30)
Chloride: 100 mEq/L (ref 96–112)
Creatinine, Ser: 1.7 mg/dL — ABNORMAL HIGH (ref 0.50–1.35)
GLUCOSE: 120 mg/dL — AB (ref 70–99)
HEMATOCRIT: 41 % (ref 39.0–52.0)
HEMOGLOBIN: 13.9 g/dL (ref 13.0–17.0)
Potassium: 3.7 mEq/L (ref 3.7–5.3)
Sodium: 141 mEq/L (ref 137–147)
TCO2: 27 mmol/L (ref 0–100)

## 2013-03-06 MED ORDER — IPRATROPIUM-ALBUTEROL 0.5-2.5 (3) MG/3ML IN SOLN
3.0000 mL | Freq: Once | RESPIRATORY_TRACT | Status: AC
  Administered 2013-03-06: 3 mL via RESPIRATORY_TRACT

## 2013-03-06 MED ORDER — ALBUTEROL SULFATE HFA 108 (90 BASE) MCG/ACT IN AERS: 2.0000 | INHALATION_SPRAY | RESPIRATORY_TRACT | Status: DC | PRN

## 2013-03-06 MED ORDER — IPRATROPIUM-ALBUTEROL 0.5-2.5 (3) MG/3ML IN SOLN
RESPIRATORY_TRACT | Status: AC
  Filled 2013-03-06: qty 3

## 2013-03-06 NOTE — ED Provider Notes (Signed)
Medical screening examination/treatment/procedure(s) were performed by resident physician or non-physician practitioner and as supervising physician I was immediately available for consultation/collaboration.   Pauline Good MD.   Billy Fischer, MD 03/06/13 1332

## 2013-03-06 NOTE — ED Notes (Signed)
Patient reports he is here for follow up of respiratory issues

## 2013-03-06 NOTE — Discharge Instructions (Signed)
Chronic Obstructive Pulmonary Disease Chronic obstructive pulmonary disease (COPD) is a condition in which airflow from the lungs is restricted. The lungs can never return to normal, but there are measures you can take which will improve them and make you feel better. CAUSES   Smoking.  Exposure to secondhand smoke.  Breathing in irritants such as air pollution, dust, cigarette smoke, strong odors, aerosol sprays, or paint fumes.  History of lung infections. SYMPTOMS   Deep, persistent (chronic) cough with a large amount of thick mucus.  Wheezing.  Shortness of breath, especially with physical activity.  Feeling like you cannot get enough air.  Difficulty breathing.  Rapid breaths (tachypnea).  Gray or bluish discoloration (cyanosis) of the skin, especially in fingers, toes, or lips.  Fatigue.  Weight loss.  Swelling in legs, ankles, or feet.  Fast heartbeat (tachycardia).  Frequent lung infections.   Chest tightness. DIAGNOSIS  Initial diagnosis may be based on your history, symptoms, and physical examination. Additional tests for COPD may include:  Chest X-ray.  Computed tomography (CT) scan.  Lung (pulmonary) function tests.  Blood tests. TREATMENT  Treatment focuses on making you comfortable (supportive care). Your caregiver may prescribe medicines (inhaled or pills) to help improve your breathing. Additional treatment options may include oxygen therapy and pulmonary rehabilitation. Treatment should also include reducing your exposure to known irritants and following a plan to stop smoking. HOME CARE INSTRUCTIONS   Take all medicines, including antibiotic medicines, as directed by your caregiver.  Use inhaled medicines as directed by your caregiver.  Avoid medicines or cough syrups that dry up your airway (antihistamines) and slow down the elimination of secretions. This decreases respiratory capacity and may lead to infections.  If you smoke, stop  smoking.  Avoid exposure to smoke, chemicals, and fumes that aggravate your breathing.  Avoid contact with individuals that have a contagious illness.  Avoid extreme temperature and humidity changes.  Use humidifiers at home and at your bedside if they do not make breathing difficult.  Drink enough water and fluids to keep your urine clear or pale yellow. This loosens secretions.  Eat healthy foods. Eating smaller, more frequent meals and resting before meals may help you maintain your strength.  Ask your caregiver about the use of vitamins and mineral supplements.  Stay active. Exercise and physical activity will help maintain your ability to do things you want to do.  Balance activity with periods of rest.  Assume a position of comfort if you become short of breath.  Learn and use relaxation techniques.  Learn and use controlled breathing techniques as directed by your caregiver. Controlled breathing techniques include:  Pursed lip breathing. This breathing technique starts with breathing in (inhaling) through your nose for 1 second. Next, purse your lips as if you were going to whistle. Then breathe out (exhale) through the pursed lips for 2 seconds.  Diaphragmatic breathing. Start by putting one hand on your abdomen just above your waist. Inhale slowly through your nose. The hand on your abdomen should move out. Then exhale slowly through pursed lips. You should be able to feel the hand on your abdomen moving in as you exhale.  Learn and use controlled coughing to clear mucus from your lungs. Controlled coughing is a series of short, progressive coughs. The steps of controlled coughing are: 1. Lean your head slightly forward. 2. Breathe in deeply using diaphragmatic breathing. 3. Try to hold your breath for 3 seconds. 4. Keep your mouth slightly open while coughing twice.  5. Spit any mucus out into a tissue. 6. Rest and repeat the steps once or twice as needed.  Receive all  protective vaccines your caregiver suggests, especially pneumococcal and influenza vaccines.  Learn to manage stress.  Schedule and attend all follow-up appointments as directed by your caregiver. It is important to keep all your appointments.  Participate in pulmonary rehabilitation as directed by your caregiver.  Use home oxygen as suggested. SEEK MEDICAL CARE IF:   You are coughing up more mucus than usual.  There is a change in the color or thickness of the mucus.  Breathing is more labored than usual.  Your breathing is faster than usual.  Your skin color is more cyanotic than usual.  You are running out of the medicine you take for your breathing.  You are anxious, apprehensive, or restless.  You have a fever. SEEK IMMEDIATE MEDICAL CARE IF:   You have a rapid heart rate.  You have shortness of breath while you are resting.  You have shortness of breath that prevents you from being able to talk.  You have shortness of breath that prevents you from performing your usual physical activities.  You have chest pain lasting longer than 5 minutes.  You have a seizure.  Your family or friends notice that you are agitated or confused. MAKE SURE YOU:   Understand these instructions.  Will watch your condition.  Will get help right away if you are not doing well or get worse. Document Released: 11/29/2004 Document Revised: 11/14/2011 Document Reviewed: 10/16/2012 [facility] Patient Information 2014 [place].  Bronchospasm, Adult A bronchospasm is a spasm or tightening of the airways going into the lungs. During a bronchospasm breathing becomes more difficult because the airways get smaller. When this happens there can be coughing, a whistling sound when breathing (wheezing), and difficulty breathing. Bronchospasm is often associated with asthma, but not all patients who experience a bronchospasm have asthma. CAUSES  A bronchospasm is caused by inflammation or  irritation of the airways. The inflammation or irritation may be triggered by:   Allergies (such as to animals, pollen, food, or mold). Allergens that cause bronchospasm may cause wheezing immediately after exposure or many hours later.   Infection. Viral infections are believed to be the most common cause of bronchospasm.   Exercise.   Irritants (such as pollution, cigarette smoke, strong odors, aerosol sprays, and paint fumes).   Weather changes. Winds increase molds and pollens in the air. Rain refreshes the air by washing irritants out. Cold air may cause inflammation.   Stress and emotional upset.  SIGNS AND SYMPTOMS   Wheezing.   Excessive nighttime coughing.   Frequent or severe coughing with a simple cold.   Chest tightness.   Shortness of breath.  DIAGNOSIS  Bronchospasm is usually diagnosed through a history and physical exam. Tests, such as chest X-rays, are sometimes done to look for other conditions. TREATMENT   Inhaled medicines can be given to open up your airways and help you breathe. The medicines can be given using either an inhaler or a nebulizer machine.  Corticosteroid medicines may be given for severe bronchospasm, usually when it is associated with asthma. HOME CARE INSTRUCTIONS   Always have a plan prepared for seeking medical care. Know when to call your health care provider and local emergency services (911 in the [place] Know where you can access local emergency care.  Only take medicines as directed by your health care provider.  If you were prescribed  an inhaler or nebulizer machine, ask your health care provider to explain how to use it correctly. Always use a spacer with your inhaler if you were given one.  It is necessary to remain calm during an attack. Try to relax and breathe more slowly.  Control your home environment in the following ways:   Change your heating and air conditioning filter at least once a month.   Limit  your use of fireplaces and wood stoves.  Do not smoke and do not allow smoking in your home.   Avoid exposure to perfumes and fragrances.   Get rid of pests (such as roaches and mice) and their droppings.   Throw away plants if you see mold on them.   Keep your house clean and dust free.   Replace carpet with wood, tile, or vinyl flooring. Carpet can trap dander and dust.   Use allergy-proof pillows, mattress covers, and box spring covers.   Wash bed sheets and blankets every week in hot water and dry them in a dryer.   Use blankets that are made of polyester or cotton.   Wash hands frequently. SEEK MEDICAL CARE IF:   You have muscle aches.   You have chest pain.   The sputum changes from clear or white to yellow, green, gray, or bloody.   The sputum you cough up gets thicker.   There are problems that may be related to the medicine you are given, such as a rash, itching, swelling, or trouble breathing.  SEEK IMMEDIATE MEDICAL CARE IF:   You have worsening wheezing and coughing even after taking your prescribed medicines.   You have increased difficulty breathing.   You develop severe chest pain. MAKE SURE YOU:   Understand these instructions.  Will watch your condition.  Will get help right away if you are not doing well or get worse. Document Released: 02/22/2003 Document Revised: 10/22/2012 Document Reviewed: 08/11/2012 [facility] Patient Information 2014 [place].  Diabetic Nephropathy Diabetic nephropathy is a complication of diabetes that leads to damaged kidneys. It develops slowly. The function of healthy kidneys is to filter and clean blood. Kidneys also get rid of body waste products and extra fluid. When the kidney filters are damaged, there is protein loss in the urine, a decline in kidney function, a buildup of kidney waste products and fluid, and high blood pressure. The damage progresses until the kidneys fail.  RISK  FACTORS  High blood pressure (hypertension).  High blood sugar (hyperglycemia).  Family history.  Aging.  Obstruction problems affecting the kidneys, the tubes that drain the kidneys (ureters), or the bladder.  Taking certain drugs or medicines. SYMPTOMS  Symptoms may not be seen or felt for many years. You may not notice any signs of kidney failure until your kidneys have lost much of their ability to function. An early sign of damage is when small amounts of protein (albumin) leak into the urine. However, this can only be found through a urine test. Without physical symptoms, a urine test is often not performed. When the kidneys fail, you may feel one or more of the following:  Swelling of the hands and feet from the extra fluid in your body.  Constant upset stomach.  Constant fatigue. DIAGNOSIS When someone has diabetes, screening tests are done to look for any early signs of problems before symptoms develop and before damage has already been done. These tests may include:   Annual urine tests to screen for trace amounts of protein in  the urine (microalbuminuria).  Urine collectionover 24 hours to measure kidney function.  Blood tests that measure kidney function. Your caregiver is aware that problems other than diabetes can damage kidneys. If screening tests show early kidney damage, but it is thought that a different problem is causing the damage, other tests may be performed. Examples of these tests include:  An ultrasound of your kidney.  Taking a tissue sample (biopsy) from the kidney. TREATMENT The goal of treatment is to prevent or slow down damage to your kidneys. Controlling hypertension and hyperglycemia is critical. Your goal is to maintain a blood pressure below 120/80. If you have certain other medical problems, this goal may be different. Talk to your caregiver to make sure that your blood pressure goal is right for your needs. Regular testing of your blood  glucose at home is important. Your goal is to have a normal blood glucose (110 or less when fasting) as often as possible.  In addition, maintaining your hemoglobin A1c level at less than 7% reduces your risk for complications, including kidney damage. Common treatments include:  Dieting by controlling what you eat as well as the portion sizes.  Exercising to control blood pressure and blood glucose.  Taking medicines.  Giving yourself insulin injections if your caregiver feels that it is necessary.  Getting early treatment for urinary tract infections.  Regularly following up with your caregiver. If your disease progresses to end-stage kidney failure, you will need dialysis or a transplant. Dialysis can be done in 1 of 2 ways:  Hemodialysis. Your blood flows from a tube in your arm through a machine. The machine filters waste and extra fluid. The clean blood flows back into your arm.  Peritoneal dialysis. Your abdomen is filled with a special fluid. The fluid collects waste products and extra fluid from your blood. The fluid is then drained from your abdomen and discarded. SEEK MEDICAL CARE IF:   You are having problems keeping your blood glucose in the goal range.  You have swelling of the hands or feet.  You have weakness.  You have muscles spasms.  You have a constant upset stomach.  You feel tired all the time and this is not normal for you. SEEK IMMEDIATE MEDICAL CARE IF:  You have unusual dizziness or weakness.  You have excessive sleepiness.  You have a seizure or convulsion.  You have severe, painful muscle spasms.  You have shortness of breath or trouble breathing.  You pass out or have a fainting episode.  You have chest pains. MAKE SURE YOU:  Understand these instructions.  Will watch your condition.  Will get help right away if you are not doing well or get worse. Document Released: 03/11/2007 Document Revised: 10/22/2012 Document Reviewed:  10/11/2010 [facility] Patient Information 2014 [place], [place].

## 2013-03-06 NOTE — ED Provider Notes (Signed)
CSN: [id number]     Arrival date & time 03/06/13  0801 History   First MD Initiated Contact with Patient 03/06/13 [phone]     Chief Complaint  Patient presents with  . Follow-up   (Consider location/radiation/quality/duration/timing/severity/associated sxs/prior Treatment) HPI Comments: 80 year old man was in the urgent care 3 days ago for evaluation low-grade fever and COPD exacerbation. He states he is here today for followup. He has no complaints today except he feels weak. He has no shortness of breath, chest pain, fever, chills, upper respiratory congestion, sore throat or other symptoms. He states he has a inhaler at home but cannot recall the name of it. Twisted only medication list is Dulera.  Zack, PA st he had him return primarily to chk Creatinine level.    Past Medical History  Diagnosis Date  . CAD (coronary artery disease)   . Hyperlipidemia   . Hypertension   . COPD (chronic obstructive pulmonary disease)     Stage 2  . Myocardial infarction   . Anginal pain   . Shortness of breath   . Lyme disease   . Sleep apnea     uses cpap  . Diabetes mellitus, type 2   . Heart murmur   . Fever 12/26/2011  . Chronic kidney disease     renal insufficiency  . GERD (gastroesophageal reflux disease)   . Arthritis     spine  . Chest pain on exertion 02/2012   Past Surgical History  Procedure Laterality Date  . Cervical laminectomy    . Cholecystectomy    . Appendectomy    . Kidney stone surgery    . Left heart cath  02/15/12   Family History  Problem Relation Age of Onset  . ALS Father    History  Substance Use Topics  . Smoking status: Former Smoker    Quit date: 03/05/1958  . Smokeless tobacco: Never Used  . Alcohol Use: No    Review of Systems  Constitutional: Positive for activity change. Negative for fever, chills and fatigue.  HENT: Negative.   Respiratory: Negative for chest tightness, shortness of breath, wheezing and stridor.   Cardiovascular: Negative  for chest pain, palpitations and leg swelling.  Gastrointestinal: Negative.   Skin: Negative for rash.  Neurological: Negative.     Allergies  Codeine sulfate and Cephalexin  Home Medications   Current Outpatient Rx  Name  Route  Sig  Dispense  Refill  . acetaminophen (TYLENOL) 325 MG tablet   Oral   Take 650 mg by mouth every evening.         [name] albuterol (PROVENTIL HFA;VENTOLIN HFA) 108 (90 BASE) MCG/ACT inhaler   Inhalation   Inhale 2 puffs into the lungs every 4 (four) hours as needed for wheezing or shortness of breath.   1 Inhaler   0   . aspirin EC 81 MG tablet   Oral   Take 1 tablet (81 mg total) by mouth daily.         [name] atorvastatin (LIPITOR) 10 MG tablet      TAKE 1 TABLET BY MOUTH ONCE A DAY   30 tablet   5   . benzonatate (TESSALON) 200 MG capsule   Oral   Take 1 capsule (200 mg total) by mouth 3 (three) times daily as needed for cough.   20 capsule   0   . betamethasone dipropionate (DIPROLENE) 0.05 % ointment   Topical   Apply 1 application topically daily. To outside of ear  30 g   4   . calcium-vitamin D (OSCAL WITH D) 500-200 MG-UNIT per tablet   Oral   Take 1 tablet by mouth daily.         . Cetirizine HCl (ZYRTEC PO)   Oral   Take 1 tablet by mouth daily as needed. For allergies         . glimepiride (AMARYL) 2 MG tablet      TAKE 1 TABLET BY MOUTH ONCE A DAY BEFOREBREAKFAST   30 tablet   11   . isosorbide mononitrate (IMDUR) 30 MG 24 hr tablet      TAKE 3 TABLETS BY MOUTH ONCE DAILY   90 tablet   0   . metFORMIN (GLUCOPHAGE-XR) 500 MG 24 hr tablet      TAKE 1 TABLET BY MOUTH ONCE A DAY   90 tablet   3   . metoprolol succinate (TOPROL-XL) 50 MG 24 hr tablet   Oral   Take 1 tablet (50 mg total) by mouth daily. Take with or immediately following a meal.   90 tablet   3   . mometasone-formoterol (DULERA) 100-5 MCG/ACT AERO   Inhalation   Inhale 2 puffs into the lungs 2 (two) times daily.         [name] moxifloxacin  (AVELOX) 400 MG tablet   Oral   Take 1 tablet (400 mg total) by mouth daily.   7 tablet   0   . moxifloxacin (AVELOX) 400 MG tablet   Oral   Take 1 tablet (400 mg total) by mouth daily.   5 tablet   0   . nitroGLYCERIN (NITROSTAT) 0.4 MG SL tablet   Sublingual   Place 0.4 mg under the tongue every 5 (five) minutes as needed. For chest pain         . Omega-3 Fatty Acids (FISH OIL PO)   Oral   Take 2 capsules by mouth 2 (two) times daily.         [name] (SYSTANE OP)   Both Eyes   Place 1 drop into both eyes 4 (four) times daily. Itchy/dry eye         . potassium citrate (UROCIT-K) 10 MEQ (1080 MG) SR tablet   Oral   Take 10 mEq by mouth Twice daily.         . predniSONE (DELTASONE) 50 MG tablet   Oral   Take 1 tablet (50 mg total) by mouth daily with breakfast.   5 tablet   0   . Pseudoephed HCl-CPM-DM HBr Tan 30-4-30 MG/5ML SUSP   Oral   Take 10 mLs by mouth 2 (two) times daily as needed.   437 mL   0   . ranitidine (ZANTAC) 300 MG tablet      TAKE 1 TABLET BY MOUTH TWICE A DAY   180 tablet   3   . ranolazine (RANEXA) 500 MG 12 hr tablet   Oral   Take 1 tablet (500 mg total) by mouth 2 (two) times daily.   60 tablet   6   . traMADol (ULTRAM) 50 MG tablet      TAKE 1 TABLET BY MOUTH WITH TYLENOL 325MG  EVERY 6 HOURS AS NEEDED FORPAIN (THIS IS A SUBSTITUTE FOR DARVOCET-N )   30 tablet   3   . traZODone (DESYREL) 150 MG tablet   Oral   Take 150 mg by mouth at bedtime.         [name]  traZODone (DESYREL) 150 MG tablet      TAKE 1 TABLET BY MOUTH ONCE A DAY   30 tablet   5   . traZODone (DESYREL) 150 MG tablet      TAKE 1 TABLET BY MOUTH ONCE A DAY   30 tablet   6   . zinc sulfate 220 MG capsule   Oral   Take 220 mg by mouth daily.          BP 146/66  Pulse 62  Temp(Src) 97.7 F (36.5 C) (Oral)  Resp 18  SpO2 95% Physical Exam  Nursing note and vitals reviewed. Constitutional: He is oriented to person,  place, and time. He appears well-developed and well-nourished. No distress.  HENT:  Mouth/Throat: Oropharynx is clear and moist. No oropharyngeal exudate.  Bilateral TMs are normal  Eyes: Conjunctivae and EOM are normal.  Neck: Normal range of motion. Neck supple.  Cardiovascular: Normal rate, regular rhythm and normal heart sounds.   Pulmonary/Chest: Effort normal. No respiratory distress. He has wheezes.  Bilateral diffuse wheezing and coarseness. No crackles.  Musculoskeletal: Normal range of motion. He exhibits no edema.  Lymphadenopathy:    He has no cervical adenopathy.  Neurological: He is alert and oriented to person, place, and time.  Skin: Skin is warm and dry. No rash noted.  Psychiatric: He has a normal mood and affect.    ED Course  Procedures (including critical care time) Labs Review Labs Reviewed  POCT I-STAT, CHEM 8 - Abnormal; Notable for the following:    BUN 31 (*)    Creatinine, Ser 1.70 (*)    Glucose, Bld 120 (*)    All other components within normal limits   Imaging Review No results found.     MDM   1. COPD exacerbation   2. Bronchospasm   3. Diabetic nephropathy   4. T2DM (type 2 diabetes mellitus)      Albuterol HFA 2 puffs every 4 hours when necessary cough and wheeze Creatinine is 1.7 which is 0.01 from 3 days ago. Suspect this is from diabetic nephropathy. It is recommended that the creatinine is 1.4 and above stop metformin. This was discussed with the patient and he is to call his physicians office to let him know what these findings. Also call for an appointment as soon as possible. Treatment will consist of limited in drugs and will exacerbate renal function and to optimize glycemic control. Continue medication given to you 3 days ago.  Janne Napoleon, NP 03/06/13 [phone]

## 2013-03-10 ENCOUNTER — Encounter: Payer: Self-pay | Admitting: Family

## 2013-03-10 ENCOUNTER — Other Ambulatory Visit: Payer: Self-pay | Admitting: Internal Medicine

## 2013-03-10 ENCOUNTER — Ambulatory Visit (INDEPENDENT_AMBULATORY_CARE_PROVIDER_SITE_OTHER): Payer: Medicare HMO | Admitting: Family

## 2013-03-10 ENCOUNTER — Ambulatory Visit: Payer: Self-pay | Admitting: Family Medicine

## 2013-03-10 VITALS — BP 132/80 | HR 76 | Wt 193.0 lb

## 2013-03-10 DIAGNOSIS — E119 Type 2 diabetes mellitus without complications: Secondary | ICD-10-CM

## 2013-03-10 DIAGNOSIS — Z794 Long term (current) use of insulin: Secondary | ICD-10-CM

## 2013-03-10 DIAGNOSIS — J441 Chronic obstructive pulmonary disease with (acute) exacerbation: Secondary | ICD-10-CM

## 2013-03-10 MED ORDER — GLIMEPIRIDE 4 MG PO TABS: ORAL_TABLET | ORAL | Status: DC

## 2013-03-10 MED ORDER — PREDNISONE 20 MG PO TABS: ORAL_TABLET | ORAL | Status: DC

## 2013-03-10 NOTE — Progress Notes (Signed)
Subjective:    Patient ID: Roy [name], male    DOB: 10/17/1932, 80 y.o.   MRN: [id number] HPI 80 year old caucasian male, nonsmoker presenting today for f/u for copd exacerbation.  He was seen at Urgent clinic on 03/03/13 and 03/06/13.  He was prescribed prednisone, moxiflaxin, and tessalon pearls.  He has currently finished the antibiotic and steroid but symptoms are still present.  He has chest tightness and congestion. Due to renal insufficiency, the UC discontinued Metformin.     Review of Systems  Constitutional: Negative.   HENT: Positive for congestion.        Nasal congestion  Eyes: Negative.   Respiratory: Positive for cough and chest tightness.        Cough and chest tightness  Cardiovascular: Negative.   Gastrointestinal: Negative.   Endocrine: Negative.   Genitourinary: Negative.   Musculoskeletal: Negative.   Skin: Negative.   Allergic/Immunologic: Negative.   Neurological: Negative.   Hematological: Negative.   Psychiatric/Behavioral: Negative.    Past Medical History  Diagnosis Date  . CAD (coronary artery disease)   . Hyperlipidemia   . Hypertension   . COPD (chronic obstructive pulmonary disease)     Stage 2  . Myocardial infarction   . Anginal pain   . Shortness of breath   . Lyme disease   . Sleep apnea     uses cpap  . Diabetes mellitus, type 2   . Heart murmur   . Fever 12/26/2011  . Chronic kidney disease     renal insufficiency  . GERD (gastroesophageal reflux disease)   . Arthritis     spine  . Chest pain on exertion 02/2012    History   Social History  . Marital Status: Married    Spouse Name: N/A    Number of Children: N/A  . Years of Education: N/A   Occupational History  . Not on file.   Social History Main Topics  . Smoking status: Former Smoker    Quit date: 03/05/1958  . Smokeless tobacco: Never Used  . Alcohol Use: No  . Drug Use: No  . Sexual Activity: Not Currently   Other Topics Concern  . Not on file    Social History Narrative  . No narrative on file    Past Surgical History  Procedure Laterality Date  . Cervical laminectomy    . Cholecystectomy    . Appendectomy    . Kidney stone surgery    . Left heart cath  02/15/12    Family History  Problem Relation Age of Onset  . ALS Father     Allergies  Allergen Reactions  . Codeine Sulfate Other (See Comments)    Chest pain  . Cephalexin Rash    Current Outpatient Prescriptions on File Prior to Visit  Medication Sig Dispense Refill  . acetaminophen (TYLENOL) 325 MG tablet Take 650 mg by mouth every evening.      [name] albuterol (PROVENTIL HFA;VENTOLIN HFA) 108 (90 BASE) MCG/ACT inhaler Inhale 2 puffs into the lungs every 4 (four) hours as needed for wheezing or shortness of breath.  1 Inhaler  0  . aspirin EC 81 MG tablet Take 1 tablet (81 mg total) by mouth daily.      [name] atorvastatin (LIPITOR) 10 MG tablet TAKE 1 TABLET BY MOUTH ONCE A DAY  30 tablet  5  . benzonatate (TESSALON) 200 MG capsule Take 1 capsule (200 mg total) by mouth 3 (three) times daily as needed  for cough.  20 capsule  0  . betamethasone dipropionate (DIPROLENE) 0.05 % ointment Apply 1 application topically daily. To outside of ear  30 g  4  . calcium-vitamin D (OSCAL WITH D) 500-200 MG-UNIT per tablet Take 1 tablet by mouth daily.      . Cetirizine HCl (ZYRTEC PO) Take 1 tablet by mouth daily as needed. For allergies      . metoprolol succinate (TOPROL-XL) 50 MG 24 hr tablet Take 1 tablet (50 mg total) by mouth daily. Take with or immediately following a meal.  90 tablet  3  . mometasone-formoterol (DULERA) 100-5 MCG/ACT AERO Inhale 2 puffs into the lungs 2 (two) times daily.      . nitroGLYCERIN (NITROSTAT) 0.4 MG SL tablet Place 0.4 mg under the tongue every 5 (five) minutes as needed. For chest pain      . Omega-3 Fatty Acids (FISH OIL PO) Take 2 capsules by mouth 2 (two) times daily.      [name] (SYSTANE OP) Place 1 drop into both  eyes 4 (four) times daily. Itchy/dry eye      . potassium citrate (UROCIT-K) 10 MEQ (1080 MG) SR tablet Take 10 mEq by mouth Twice daily.      . Pseudoephed HCl-CPM-DM HBr Tan 30-4-30 MG/5ML SUSP Take 10 mLs by mouth 2 (two) times daily as needed.  437 mL  0  . ranitidine (ZANTAC) 300 MG tablet TAKE 1 TABLET BY MOUTH TWICE A DAY  180 tablet  3  . ranolazine (RANEXA) 500 MG 12 hr tablet Take 1 tablet (500 mg total) by mouth 2 (two) times daily.  60 tablet  6  . traMADol (ULTRAM) 50 MG tablet TAKE 1 TABLET BY MOUTH WITH TYLENOL 325MG  EVERY 6 HOURS AS NEEDED FORPAIN (THIS IS A SUBSTITUTE FOR DARVOCET-N )  30 tablet  3  . traZODone (DESYREL) 150 MG tablet Take 150 mg by mouth at bedtime.      . traZODone (DESYREL) 150 MG tablet TAKE 1 TABLET BY MOUTH ONCE A DAY  30 tablet  5  . traZODone (DESYREL) 150 MG tablet TAKE 1 TABLET BY MOUTH ONCE A DAY  30 tablet  6  . zinc sulfate 220 MG capsule Take 220 mg by mouth daily.      [name] moxifloxacin (AVELOX) 400 MG tablet Take 1 tablet (400 mg total) by mouth daily.  7 tablet  0   No current facility-administered medications on file prior to visit.    BP 132/80  Pulse 76  Wt 193 lb (87.544 kg)  SpO2 93%chart     Objective:   Physical Exam  Constitutional: He is oriented to person, place, and time. He appears well-developed and well-nourished.  HENT:  Right Ear: External ear normal.  Left Ear: External ear normal.  Nose: Nose normal.  Mouth/Throat: Oropharynx is clear and moist.  Neck: Normal range of motion. Neck supple.  Cardiovascular: Normal rate, regular rhythm and normal heart sounds.   Pulmonary/Chest: Effort normal and breath sounds normal.  Cough and diminished breath sounds noted  Musculoskeletal: Normal range of motion.  Neurological: He is alert and oriented to person, place, and time.  Skin: Skin is warm and dry.          Assessment & Plan:  Assessment 1. COPD Exacerbation 2. Type II Diabetes Mellitus  Plan 1. Prednisone 60  mg x 3 days, 40 mg x 3 days, and 20 mg x 3 days. 2. Increase Amaryl  To  4 mg.   3. Continue current medications. 4. Drink plenty of fluids and use aseptic handwashing techniques. 5. Contact office if problems persist or worsen.

## 2013-03-10 NOTE — Patient Instructions (Addendum)
1. Increase Amaryl to 4mg  once daily.  Chronic Obstructive Pulmonary Disease Chronic obstructive pulmonary disease (COPD) is a condition in which airflow from the lungs is restricted. The lungs can never return to normal, but there are measures you can take which will improve them and make you feel better. CAUSES   Smoking.  Exposure to secondhand smoke.  Breathing in irritants such as air pollution, dust, cigarette smoke, strong odors, aerosol sprays, or paint fumes.  History of lung infections. SYMPTOMS   Deep, persistent (chronic) cough with a large amount of thick mucus.  Wheezing.  Shortness of breath, especially with physical activity.  Feeling like you cannot get enough air.  Difficulty breathing.  Rapid breaths (tachypnea).  Gray or bluish discoloration (cyanosis) of the skin, especially in fingers, toes, or lips.  Fatigue.  Weight loss.  Swelling in legs, ankles, or feet.  Fast heartbeat (tachycardia).  Frequent lung infections.   Chest tightness. DIAGNOSIS  Initial diagnosis may be based on your history, symptoms, and physical examination. Additional tests for COPD may include:  Chest X-ray.  Computed tomography (CT) scan.  Lung (pulmonary) function tests.  Blood tests. TREATMENT  Treatment focuses on making you comfortable (supportive care). Your caregiver may prescribe medicines (inhaled or pills) to help improve your breathing. Additional treatment options may include oxygen therapy and pulmonary rehabilitation. Treatment should also include reducing your exposure to known irritants and following a plan to stop smoking. HOME CARE INSTRUCTIONS   Take all medicines, including antibiotic medicines, as directed by your caregiver.  Use inhaled medicines as directed by your caregiver.  Avoid medicines or cough syrups that dry up your airway (antihistamines) and slow down the elimination of secretions. This decreases respiratory capacity and may lead to  infections.  If you smoke, stop smoking.  Avoid exposure to smoke, chemicals, and fumes that aggravate your breathing.  Avoid contact with individuals that have a contagious illness.  Avoid extreme temperature and humidity changes.  Use humidifiers at home and at your bedside if they do not make breathing difficult.  Drink enough water and fluids to keep your urine clear or pale yellow. This loosens secretions.  Eat healthy foods. Eating smaller, more frequent meals and resting before meals may help you maintain your strength.  Ask your caregiver about the use of vitamins and mineral supplements.  Stay active. Exercise and physical activity will help maintain your ability to do things you want to do.  Balance activity with periods of rest.  Assume a position of comfort if you become short of breath.  Learn and use relaxation techniques.  Learn and use controlled breathing techniques as directed by your caregiver. Controlled breathing techniques include:  Pursed lip breathing. This breathing technique starts with breathing in (inhaling) through your nose for 1 second. Next, purse your lips as if you were going to whistle. Then breathe out (exhale) through the pursed lips for 2 seconds.  Diaphragmatic breathing. Start by putting one hand on your abdomen just above your waist. Inhale slowly through your nose. The hand on your abdomen should move out. Then exhale slowly through pursed lips. You should be able to feel the hand on your abdomen moving in as you exhale.  Learn and use controlled coughing to clear mucus from your lungs. Controlled coughing is a series of short, progressive coughs. The steps of controlled coughing are: 1. Lean your head slightly forward. 2. Breathe in deeply using diaphragmatic breathing. 3. Try to hold your breath for 3 seconds. 4.  Keep your mouth slightly open while coughing twice. 5. Spit any mucus out into a tissue. 6. Rest and repeat the steps once  or twice as needed.  Receive all protective vaccines your caregiver suggests, especially pneumococcal and influenza vaccines.  Learn to manage stress.  Schedule and attend all follow-up appointments as directed by your caregiver. It is important to keep all your appointments.  Participate in pulmonary rehabilitation as directed by your caregiver.  Use home oxygen as suggested. SEEK MEDICAL CARE IF:   You are coughing up more mucus than usual.  There is a change in the color or thickness of the mucus.  Breathing is more labored than usual.  Your breathing is faster than usual.  Your skin color is more cyanotic than usual.  You are running out of the medicine you take for your breathing.  You are anxious, apprehensive, or restless.  You have a fever. SEEK IMMEDIATE MEDICAL CARE IF:   You have a rapid heart rate.  You have shortness of breath while you are resting.  You have shortness of breath that prevents you from being able to talk.  You have shortness of breath that prevents you from performing your usual physical activities.  You have chest pain lasting longer than 5 minutes.  You have a seizure.  Your family or friends notice that you are agitated or confused. MAKE SURE YOU:   Understand these instructions.  Will watch your condition.  Will get help right away if you are not doing well or get worse. Document Released: 11/29/2004 Document Revised: 11/14/2011 Document Reviewed: 10/16/2012 [facility] Patient Information 2014 [place].

## 2013-03-11 ENCOUNTER — Other Ambulatory Visit: Payer: Self-pay

## 2013-03-11 MED ORDER — GLIMEPIRIDE 4 MG PO TABS: ORAL_TABLET | ORAL | Status: DC

## 2013-03-11 MED ORDER — PREDNISONE 20 MG PO TABS: ORAL_TABLET | ORAL | Status: AC

## 2013-03-25 ENCOUNTER — Telehealth: Payer: Self-pay | Admitting: Cardiology

## 2013-03-25 NOTE — Telephone Encounter (Signed)
Patient received a letter from [facility] stating that they are giving patient some Ranexa on a temporary basis and asking that Dr. Aundra Dubin would change to another medication.  I have given the letter to medical records to give to RN.  Please call patient regarding a new Rx or letter requesting [employer] make an exception.

## 2013-03-25 NOTE — Telephone Encounter (Signed)
I spoke with Roy [name] at [facility] [place], reference # [id number]. She told me Ranexa did not require prior authorization and pt should be able to get future refills without any further information from Dr Aundra Dubin. Pt advised.

## 2013-03-25 NOTE — Telephone Encounter (Signed)
Walk in pt Form "[employer]" paper Dropped Off gave to [facility]

## 2013-04-01 ENCOUNTER — Telehealth: Payer: Self-pay | Admitting: Internal Medicine

## 2013-04-01 NOTE — Telephone Encounter (Signed)
Relevant patient education mailed to patient.  

## 2013-05-06 ENCOUNTER — Telehealth: Payer: Self-pay | Admitting: Internal Medicine

## 2013-05-06 MED ORDER — ISOSORBIDE MONONITRATE ER 30 MG PO TB24: ORAL_TABLET | ORAL | Status: DC

## 2013-05-06 MED ORDER — RANITIDINE HCL 300 MG PO TABS: ORAL_TABLET | ORAL | Status: DC

## 2013-05-06 MED ORDER — ATORVASTATIN CALCIUM 10 MG PO TABS: ORAL_TABLET | ORAL | Status: DC

## 2013-05-06 MED ORDER — TRAMADOL HCL 50 MG PO TABS: ORAL_TABLET | ORAL | Status: DC

## 2013-05-06 MED ORDER — TRAZODONE HCL 150 MG PO TABS: ORAL_TABLET | ORAL | Status: DC

## 2013-05-06 MED ORDER — GLIMEPIRIDE 4 MG PO TABS: ORAL_TABLET | ORAL | Status: DC

## 2013-05-06 NOTE — Telephone Encounter (Signed)
Also requesting refill of traMADol (ULTRAM) 50 MG tablet along with previous meds requested below.

## 2013-05-06 NOTE — Telephone Encounter (Signed)
[facility] [place], [address] [facility] [address] requesting new scripts for the following:  traZODone (DESYREL) 150 MG tablet atorvastatin (LIPITOR) 10 MG tablet glimepiride (AMARYL) 4 MG tablet ranitidine (ZANTAC) 300 MG tablet isosorbide mononitrate (IMDUR) 30 MG 24 hr tablet

## 2013-05-06 NOTE — Telephone Encounter (Signed)
rx sent in electronically 

## 2013-05-07 ENCOUNTER — Other Ambulatory Visit: Payer: Self-pay

## 2013-05-07 DIAGNOSIS — R079 Chest pain, unspecified: Secondary | ICD-10-CM

## 2013-05-07 DIAGNOSIS — I2510 Atherosclerotic heart disease of native coronary artery without angina pectoris: Secondary | ICD-10-CM

## 2013-05-07 MED ORDER — RANOLAZINE ER 500 MG PO TB12: 500.0000 mg | ORAL_TABLET | Freq: Two times a day (BID) | ORAL | Status: DC

## 2013-05-07 MED ORDER — METOPROLOL SUCCINATE ER 50 MG PO TB24: 50.0000 mg | ORAL_TABLET | Freq: Every day | ORAL | Status: DC

## 2013-05-08 ENCOUNTER — Telehealth: Payer: Self-pay | Admitting: Internal Medicine

## 2013-05-08 NOTE — Telephone Encounter (Signed)
Noted  

## 2013-05-08 NOTE — Telephone Encounter (Signed)
Patient Information:  Caller Name: Steward Drone  Phone: [phone]  Patient: Roy [name], Roy [name]  Gender: Male  DOB: [birth year]  Age: 80 Years  PCP: Benay Pillow (Adults only)  Office Follow Up:  Does the office need to follow up with this patient?: No  Instructions For The Office: N/A   Symptoms  Reason For Call & Symptoms: Pt swallowed his Spireva capsule orally by mistake. RN called the pharmacist Caryl Pina at [facility] who advised per drug information that I capsule swallowed is not absorbed well/so recommendations are to go ahead and take it by inalaltion. Pt notified. Pt has been taking this medication over the past 2 weeks. RN advised that he go ahead and keep it stored in a separate place and not take it at the same time as his morning meds to prevent future occurances.  Reviewed Health History In EMR: Yes  Reviewed Medications In EMR: Yes  Reviewed Allergies In EMR: Yes  Reviewed Surgeries / Procedures: Yes  Date of Onset of Symptoms: 05/08/2013  Guideline(s) Used:  No Protocol Available - Information Only  Disposition Per Guideline:   Home Care  Reason For Disposition Reached:   Information only question and nurse able to answer  Advice Given:  Call Back If:  New symptoms develop  You become worse.  Patient Will Follow Care Advice:  YES

## 2013-05-18 ENCOUNTER — Ambulatory Visit (INDEPENDENT_AMBULATORY_CARE_PROVIDER_SITE_OTHER): Payer: Medicare HMO | Admitting: Internal Medicine

## 2013-05-18 ENCOUNTER — Encounter: Payer: Self-pay | Admitting: Internal Medicine

## 2013-05-18 VITALS — BP 148/80 | HR 54 | Temp 97.8°F | Resp 20 | Ht 69.0 in | Wt 196.0 lb

## 2013-05-18 DIAGNOSIS — E1165 Type 2 diabetes mellitus with hyperglycemia: Secondary | ICD-10-CM

## 2013-05-18 DIAGNOSIS — T887XXA Unspecified adverse effect of drug or medicament, initial encounter: Secondary | ICD-10-CM

## 2013-05-18 DIAGNOSIS — E1169 Type 2 diabetes mellitus with other specified complication: Principal | ICD-10-CM

## 2013-05-18 DIAGNOSIS — IMO0002 Reserved for concepts with insufficient information to code with codable children: Secondary | ICD-10-CM

## 2013-05-18 LAB — HEMOGLOBIN A1C: Hgb A1c MFr Bld: 7.4 % — ABNORMAL HIGH (ref 4.6–6.5)

## 2013-05-18 LAB — COMPREHENSIVE METABOLIC PANEL
ALBUMIN: 4.1 g/dL (ref 3.5–5.2)
ALT: 19 U/L (ref 0–53)
AST: 14 U/L (ref 0–37)
Alkaline Phosphatase: 64 U/L (ref 39–117)
BILIRUBIN TOTAL: 0.6 mg/dL (ref 0.3–1.2)
BUN: 26 mg/dL — ABNORMAL HIGH (ref 6–23)
CO2: 28 mEq/L (ref 19–32)
Calcium: 9.3 mg/dL (ref 8.4–10.5)
Chloride: 101 mEq/L (ref 96–112)
Creatinine, Ser: 1.7 mg/dL — ABNORMAL HIGH (ref 0.4–1.5)
GFR: 40.48 mL/min — ABNORMAL LOW (ref >60.00)
GLUCOSE: 157 mg/dL — AB (ref 70–99)
POTASSIUM: 4.4 meq/L (ref 3.5–5.1)
Sodium: 139 mEq/L (ref 135–145)
Total Protein: 7.2 g/dL (ref 6.0–8.3)

## 2013-05-18 NOTE — Progress Notes (Signed)
Subjective:    Patient ID: Roy [name], male    DOB: 1932-12-21, 80 y.o.   MRN: [id number]  HPI Loss of his sister Diabetic foot exam due, wife takes care of feet! HTN stable Roy [name] is seeing for CAD Feels well CBG's were up around bronchitis and tooth infection  Past Medical History  Diagnosis Date  . CAD (coronary artery disease)   . Hyperlipidemia   . Hypertension   . COPD (chronic obstructive pulmonary disease)     Stage 2  . Myocardial infarction   . Anginal pain   . Shortness of breath   . Lyme disease   . Sleep apnea     uses cpap  . Diabetes mellitus, type 2   . Heart murmur   . Fever 12/26/2011  . Chronic kidney disease     renal insufficiency  . GERD (gastroesophageal reflux disease)   . Arthritis     spine  . Chest pain on exertion 02/2012    History   Social History  . Marital Status: Married    Spouse Name: N/A    Number of Children: N/A  . Years of Education: N/A   Occupational History  . Not on file.   Social History Main Topics  . Smoking status: Former Smoker    Quit date: 03/05/1958  . Smokeless tobacco: Never Used  . Alcohol Use: No  . Drug Use: No  . Sexual Activity: Not Currently   Other Topics Concern  . Not on file   Social History Narrative  . No narrative on file    Past Surgical History  Procedure Laterality Date  . Cervical laminectomy    . Cholecystectomy    . Appendectomy    . Kidney stone surgery    . Left heart cath  02/15/12    Family History  Problem Relation Age of Onset  . ALS Father     Allergies  Allergen Reactions  . Codeine Sulfate Other (See Comments)    Chest pain  . Cephalexin Rash    Current Outpatient Prescriptions on File Prior to Visit  Medication Sig Dispense Refill  . acetaminophen (TYLENOL) 325 MG tablet Take 650 mg by mouth every evening.      [name] albuterol (PROVENTIL HFA;VENTOLIN HFA) 108 (90 BASE) MCG/ACT inhaler Inhale 2 puffs into the lungs every 4 (four) hours as needed for  wheezing or shortness of breath.  1 Inhaler  0  . aspirin EC 81 MG tablet Take 1 tablet (81 mg total) by mouth daily.      [name] atorvastatin (LIPITOR) 10 MG tablet TAKE 1 TABLET BY MOUTH ONCE A DAY  90 tablet  3  . benzonatate (TESSALON) 200 MG capsule Take 1 capsule (200 mg total) by mouth 3 (three) times daily as needed for cough.  20 capsule  0  . betamethasone dipropionate (DIPROLENE) 0.05 % ointment Apply 1 application topically daily. To outside of ear  30 g  4  . calcium-vitamin D (OSCAL WITH D) 500-200 MG-UNIT per tablet Take 1 tablet by mouth daily.      . Cetirizine HCl (ZYRTEC PO) Take 1 tablet by mouth daily as needed. For allergies      . glimepiride (AMARYL) 4 MG tablet TAKE 1 TABLET BY MOUTH ONCE A DAY BEFORE BREAKFAST  90 tablet  3  . isosorbide mononitrate (IMDUR) 30 MG 24 hr tablet TAKE 3 TABLETS BY MOUTH ONCE A DAY  90 tablet  3  . metoprolol succinate (TOPROL-XL)  50 MG 24 hr tablet Take 1 tablet (50 mg total) by mouth daily. Take with or immediately following a meal.  90 tablet  1  . mometasone-formoterol (DULERA) 100-5 MCG/ACT AERO Inhale 2 puffs into the lungs 2 (two) times daily.      [name] moxifloxacin (AVELOX) 400 MG tablet Take 1 tablet (400 mg total) by mouth daily.  7 tablet  0  . nitroGLYCERIN (NITROSTAT) 0.4 MG SL tablet Place 0.4 mg under the tongue every 5 (five) minutes as needed. For chest pain      . Omega-3 Fatty Acids (FISH OIL PO) Take 2 capsules by mouth 2 (two) times daily.      [name] (SYSTANE OP) Place 1 drop into both eyes 4 (four) times daily. Itchy/dry eye      . potassium citrate (UROCIT-K) 10 MEQ (1080 MG) SR tablet Take 10 mEq by mouth Twice daily.      . Pseudoephed HCl-CPM-DM HBr Tan 30-4-30 MG/5ML SUSP Take 10 mLs by mouth 2 (two) times daily as needed.  437 mL  0  . ranitidine (ZANTAC) 300 MG tablet TAKE 1 TABLET BY MOUTH TWICE A DAY  180 tablet  3  . ranolazine (RANEXA) 500 MG 12 hr tablet Take 1 tablet (500 mg total) by mouth  2 (two) times daily.  180 tablet  1  . traMADol (ULTRAM) 50 MG tablet TAKE 1 TABLET BY MOUTH WITH TYLENOL 325MG  EVERY 6 HOURS AS NEEDED FORPAIN (THIS IS A SUBSTITUTE FOR DARVOCET-N )  90 tablet  1  . traZODone (DESYREL) 150 MG tablet TAKE 1 TABLET BY MOUTH ONCE A DAY  90 tablet  1  . zinc sulfate 220 MG capsule Take 220 mg by mouth daily.       No current facility-administered medications on file prior to visit.    BP 148/80  Pulse 54  Temp(Src) 97.8 F (36.6 C) (Oral)  Resp 20  Ht 5\' 9"  (1.753 m)  Wt 196 lb (88.905 kg)  BMI 28.93 kg/m2     Review of Systems  Constitutional: Positive for fatigue. Negative for fever.  HENT: Negative for congestion, hearing loss and postnasal drip.   Eyes: Negative for discharge, redness and visual disturbance.  Respiratory: Positive for chest tightness and shortness of breath. Negative for cough and wheezing.   Cardiovascular: Positive for chest pain. Negative for leg swelling.  Gastrointestinal: Negative for abdominal pain, constipation and abdominal distention.  Genitourinary: Negative for urgency and frequency.  Musculoskeletal: Negative for arthralgias, joint swelling and neck pain.  Skin: Negative for color change and rash.  Neurological: Negative for weakness and light-headedness.  Hematological: Negative for adenopathy.  Psychiatric/Behavioral: Negative for behavioral problems.   Past Medical History  Diagnosis Date  . CAD (coronary artery disease)   . Hyperlipidemia   . Hypertension   . COPD (chronic obstructive pulmonary disease)     Stage 2  . Myocardial infarction   . Anginal pain   . Shortness of breath   . Lyme disease   . Sleep apnea     uses cpap  . Diabetes mellitus, type 2   . Heart murmur   . Fever 12/26/2011  . Chronic kidney disease     renal insufficiency  . GERD (gastroesophageal reflux disease)   . Arthritis     spine  . Chest pain on exertion 02/2012    History   Social History  . Marital Status:  Married    Spouse Name: N/A  Number of Children: N/A  . Years of Education: N/A   Occupational History  . Not on file.   Social History Main Topics  . Smoking status: Former Smoker    Quit date: 03/05/1958  . Smokeless tobacco: Never Used  . Alcohol Use: No  . Drug Use: No  . Sexual Activity: Not Currently   Other Topics Concern  . Not on file   Social History Narrative  . No narrative on file    Past Surgical History  Procedure Laterality Date  . Cervical laminectomy    . Cholecystectomy    . Appendectomy    . Kidney stone surgery    . Left heart cath  02/15/12    Family History  Problem Relation Age of Onset  . ALS Father     Allergies  Allergen Reactions  . Codeine Sulfate Other (See Comments)    Chest pain  . Cephalexin Rash    Current Outpatient Prescriptions on File Prior to Visit  Medication Sig Dispense Refill  . acetaminophen (TYLENOL) 325 MG tablet Take 650 mg by mouth every evening.      [name] albuterol (PROVENTIL HFA;VENTOLIN HFA) 108 (90 BASE) MCG/ACT inhaler Inhale 2 puffs into the lungs every 4 (four) hours as needed for wheezing or shortness of breath.  1 Inhaler  0  . aspirin EC 81 MG tablet Take 1 tablet (81 mg total) by mouth daily.      [name] atorvastatin (LIPITOR) 10 MG tablet TAKE 1 TABLET BY MOUTH ONCE A DAY  90 tablet  3  . benzonatate (TESSALON) 200 MG capsule Take 1 capsule (200 mg total) by mouth 3 (three) times daily as needed for cough.  20 capsule  0  . betamethasone dipropionate (DIPROLENE) 0.05 % ointment Apply 1 application topically daily. To outside of ear  30 g  4  . calcium-vitamin D (OSCAL WITH D) 500-200 MG-UNIT per tablet Take 1 tablet by mouth daily.      . Cetirizine HCl (ZYRTEC PO) Take 1 tablet by mouth daily as needed. For allergies      . glimepiride (AMARYL) 4 MG tablet TAKE 1 TABLET BY MOUTH ONCE A DAY BEFORE BREAKFAST  90 tablet  3  . isosorbide mononitrate (IMDUR) 30 MG 24 hr tablet TAKE 3 TABLETS BY MOUTH ONCE A DAY   90 tablet  3  . metoprolol succinate (TOPROL-XL) 50 MG 24 hr tablet Take 1 tablet (50 mg total) by mouth daily. Take with or immediately following a meal.  90 tablet  1  . mometasone-formoterol (DULERA) 100-5 MCG/ACT AERO Inhale 2 puffs into the lungs 2 (two) times daily.      [name] moxifloxacin (AVELOX) 400 MG tablet Take 1 tablet (400 mg total) by mouth daily.  7 tablet  0  . nitroGLYCERIN (NITROSTAT) 0.4 MG SL tablet Place 0.4 mg under the tongue every 5 (five) minutes as needed. For chest pain      . Omega-3 Fatty Acids (FISH OIL PO) Take 2 capsules by mouth 2 (two) times daily.      [name] (SYSTANE OP) Place 1 drop into both eyes 4 (four) times daily. Itchy/dry eye      . potassium citrate (UROCIT-K) 10 MEQ (1080 MG) SR tablet Take 10 mEq by mouth Twice daily.      . Pseudoephed HCl-CPM-DM HBr Tan 30-4-30 MG/5ML SUSP Take 10 mLs by mouth 2 (two) times daily as needed.  437 mL  0  . ranitidine (ZANTAC) 300  MG tablet TAKE 1 TABLET BY MOUTH TWICE A DAY  180 tablet  3  . ranolazine (RANEXA) 500 MG 12 hr tablet Take 1 tablet (500 mg total) by mouth 2 (two) times daily.  180 tablet  1  . traMADol (ULTRAM) 50 MG tablet TAKE 1 TABLET BY MOUTH WITH TYLENOL 325MG  EVERY 6 HOURS AS NEEDED FORPAIN (THIS IS A SUBSTITUTE FOR DARVOCET-N )  90 tablet  1  . traZODone (DESYREL) 150 MG tablet TAKE 1 TABLET BY MOUTH ONCE A DAY  90 tablet  1  . zinc sulfate 220 MG capsule Take 220 mg by mouth daily.       No current facility-administered medications on file prior to visit.    BP 148/80  Pulse 54  Temp(Src) 97.8 F (36.6 C) (Oral)  Resp 20  Ht 5\' 9"  (1.753 m)  Wt 196 lb (88.905 kg)  BMI 28.93 kg/m2       Objective:   Physical Exam  Nursing note and vitals reviewed. Constitutional: He is oriented to person, place, and time. He appears well-developed and well-nourished.  HENT:  Head: Normocephalic and atraumatic.  Eyes: Conjunctivae are normal. Pupils are equal, round, and  reactive to light.  Neck: Normal range of motion. Neck supple.  Cardiovascular: Normal rate and regular rhythm.   Murmur heard. Pulmonary/Chest: Effort normal and breath sounds normal.  Abdominal: Soft. Bowel sounds are normal.  Musculoskeletal: He exhibits edema. He exhibits no tenderness.  Neurological: He is alert and oriented to person, place, and time.          Assessment & Plan:  Chest pains increased due to being out of the ranexa. [facility] did not send medications. Once resumed the CP stopped! Reveiwed the ER visit in Jan for bronchitis A1c and bmet today FOot exam excellent! CBG's have been high

## 2013-05-18 NOTE — Patient Instructions (Signed)
The patient is instructed to continue all medications as prescribed. Schedule followup with check out clerk upon leaving the clinic  

## 2013-05-18 NOTE — Progress Notes (Signed)
Pre-visit discussion using our clinic review tool. No additional management support is needed unless otherwise documented below in the visit note.  

## 2013-05-27 ENCOUNTER — Telehealth: Payer: Self-pay

## 2013-05-27 NOTE — Telephone Encounter (Signed)
Relevant patient education mailed to patient.  

## 2013-06-10 ENCOUNTER — Ambulatory Visit (INDEPENDENT_AMBULATORY_CARE_PROVIDER_SITE_OTHER): Payer: Medicare HMO | Admitting: Cardiology

## 2013-06-10 ENCOUNTER — Encounter: Payer: Self-pay | Admitting: Cardiology

## 2013-06-10 VITALS — BP 140/63 | HR 59 | Ht 69.5 in | Wt 195.0 lb

## 2013-06-10 DIAGNOSIS — I10 Essential (primary) hypertension: Secondary | ICD-10-CM

## 2013-06-10 DIAGNOSIS — E785 Hyperlipidemia, unspecified: Secondary | ICD-10-CM

## 2013-06-10 DIAGNOSIS — N189 Chronic kidney disease, unspecified: Secondary | ICD-10-CM

## 2013-06-10 DIAGNOSIS — I2510 Atherosclerotic heart disease of native coronary artery without angina pectoris: Secondary | ICD-10-CM

## 2013-06-10 NOTE — Progress Notes (Signed)
Patient ID: Roy [name], male   DOB: [birth year], 80 y.o.   MRN: [id number] PCP: Dr. Arnoldo Morale  80 yo with history of CAD s/p PTCA D1 in 1999, CKD, diabetes, HTN, and hyperlipidemia presents for cardiology followup.  In the fall of 2013, he developed exertional chest pain. ETT-myoview in 10/13 showed ischemic ECG changes but no TID and only a small fixed basal inferior defect with normal wall motion and EF 65%.   This was overall a low risk study.  Echo in 10/13 showed EF 55-60% with no major abnormalities.  Given ongoing symptoms, I admitted him overnight for hydration then cathed him in 12/13.  This showed nonobstructive CAD.  Exertional chest pain thought to be due to small vessel disease.  PFTs showed only mild obstructive airways disease.  I started him on ranolazine and his symptoms resolved.  On ranolazine, he really has not had any chest pain.  He is active, doing yardwork and training his dogs.  He went rabbit hunting this winter.  No exertional dyspnea.  Weight is stable.  No claudication. He ran out of Ranexa earlier this year for 3 days.  He developed exertional chest pain on those days. Symptoms resolved when he went back on Ranexa.    Labs (5/13): LDL 37, HDL 33 Labs (8/13): BNP 38 Labs (10/13): K 4, creatinine 2 => 1.94, BNP 53 Labs (12/13): K 4.2, creatinine 1.8 Labs (1/14): K 3.9, creatinine 2.1, LDL 46 Labs (6/14): K 4.7, creatinine 1.6 Labs (10/14): LDL 49, HDL 41 Labs (3/15): K 4.4, creatinine 1.7  ECG: NSR at 56, QT interval normal  PMH: 1. H/o [place] Spotted Fever in 3/13.  2. H/o Lyme disease in 2008. 3. CAD: PTCA D1 in 1999.  Adenosine myoview in (10/09) with EF 60%, no ischemia or infarction.  ETT-sestamibi (10/13) with 7'1" exercise, ischemic ECG changes, small fixed mild basal inferior defect with EF 65% and normal wall motion.  TID not elevated.  This was a low risk study overall.  Echo (10/13): EF 55-60%, normal RV size and systolic function, normal valves.  LHC  (12/13): Nonobstructive CAD, ? Small vessel disease as cause of his exertional chest pain.  Improved symptoms with ranolazine.  4. CKD 5. H/o rheumatic fever in 1951 6. Type II diabetes 7. COPD: PFTs (12/13) with mild obstructive airways disease and insignificant response to bronchodilators.   8. Hyperlipidemia 9. HTN  SH: Married, lives in [place].  Quit smoking in 1960.  Likes to hunt.    FH: CAD  Current Outpatient Prescriptions  Medication Sig Dispense Refill  . acetaminophen (TYLENOL) 325 MG tablet Take 650 mg by mouth every evening.      [name] albuterol (PROVENTIL HFA;VENTOLIN HFA) 108 (90 BASE) MCG/ACT inhaler Inhale 2 puffs into the lungs every 4 (four) hours as needed for wheezing or shortness of breath.  1 Inhaler  0  . aspirin EC 81 MG tablet Take 1 tablet (81 mg total) by mouth daily.      [name] atorvastatin (LIPITOR) 10 MG tablet TAKE 1 TABLET BY MOUTH ONCE A DAY  90 tablet  3  . benzonatate (TESSALON) 200 MG capsule Take 1 capsule (200 mg total) by mouth 3 (three) times daily as needed for cough.  20 capsule  0  . betamethasone dipropionate (DIPROLENE) 0.05 % ointment Apply 1 application topically daily. To outside of ear  30 g  4  . calcium-vitamin D (OSCAL WITH D) 500-200 MG-UNIT per tablet Take 1 tablet by mouth  daily.      . Cetirizine HCl (ZYRTEC PO) Take 1 tablet by mouth daily as needed. For allergies      . glimepiride (AMARYL) 4 MG tablet TAKE 1 TABLET BY MOUTH ONCE A DAY BEFORE BREAKFAST  90 tablet  3  . isosorbide mononitrate (IMDUR) 30 MG 24 hr tablet TAKE 3 TABLETS BY MOUTH ONCE A DAY  90 tablet  3  . metoprolol succinate (TOPROL-XL) 50 MG 24 hr tablet Take 1 tablet (50 mg total) by mouth daily. Take with or immediately following a meal.  90 tablet  1  . mometasone-formoterol (DULERA) 100-5 MCG/ACT AERO Inhale 2 puffs into the lungs 2 (two) times daily.      [name] moxifloxacin (AVELOX) 400 MG tablet Take 1 tablet (400 mg total) by mouth daily.  7 tablet  0  .  nitroGLYCERIN (NITROSTAT) 0.4 MG SL tablet Place 0.4 mg under the tongue every 5 (five) minutes as needed. For chest pain      . Omega-3 Fatty Acids (FISH OIL PO) Take 2 capsules by mouth 2 (two) times daily.      [name] (SYSTANE OP) Place 1 drop into both eyes 4 (four) times daily. Itchy/dry eye      . potassium citrate (UROCIT-K) 10 MEQ (1080 MG) SR tablet Take 10 mEq by mouth Twice daily.      . Pseudoephed HCl-CPM-DM HBr Tan 30-4-30 MG/5ML SUSP Take 10 mLs by mouth 2 (two) times daily as needed.  437 mL  0  . ranitidine (ZANTAC) 300 MG tablet TAKE 1 TABLET BY MOUTH TWICE A DAY  180 tablet  3  . ranolazine (RANEXA) 500 MG 12 hr tablet Take 1 tablet (500 mg total) by mouth 2 (two) times daily.  180 tablet  1  . traMADol (ULTRAM) 50 MG tablet TAKE 1 TABLET BY MOUTH WITH TYLENOL 325MG  EVERY 6 HOURS AS NEEDED FORPAIN (THIS IS A SUBSTITUTE FOR DARVOCET-N )  90 tablet  1  . traZODone (DESYREL) 150 MG tablet TAKE 1 TABLET BY MOUTH ONCE A DAY  90 tablet  1  . zinc sulfate 220 MG capsule Take 220 mg by mouth daily.       No current facility-administered medications for this visit.    BP 140/63  Pulse 59  Ht 5' 9.5" (1.765 m)  Wt 88.451 kg (195 lb)  BMI 28.39 kg/m2 General: NAD Neck: No JVD, no thyromegaly or thyroid nodule.  Lungs: Clear to auscultation bilaterally with normal respiratory effort. CV: Nondisplaced PMI.  Heart regular S1/S2, no S3/S4, 1/6 SEM.  No peripheral edema.  No carotid bruit.  Normal pedal pulses.  Abdomen: Soft, nontender, no hepatosplenomegaly, no distention.  Neurologic: Alert and oriented x 3.  Psych: Normal affect. Extremities: No clubbing or cyanosis.   Assessment/Plan: 1. CAD: Nonobstructive CAD on 12/13 cath.  I think that his symptoms may have been due to small vessel disease, which could explain the ischemic ECG response to exercise with only mild perfusion abnormality.  He has done quite well on ranolazine and is not having angina.   Continue statin, ASA 81, Toprol XL, ranolazine, and Imdur.   QT normal on ranolazine today.  2. HTN: BP ok. 3. CKD: Last creatinine 1.7 (baseline).   4. Hyperlipidemia: Good lipids in 10/14.   Larey Dresser 06/10/2013

## 2013-06-10 NOTE — Patient Instructions (Signed)
Your physician wants you to follow-up in: 6 months with Dr Aundra Dubin. (October 2015).  You will receive a reminder letter in the mail two months in advance. If you don't receive a letter, please call our office to schedule the follow-up appointment.

## 2013-07-21 ENCOUNTER — Telehealth: Payer: Self-pay | Admitting: Internal Medicine

## 2013-07-21 NOTE — Telephone Encounter (Addendum)
Pt would like to know if you will accept him and his wife (polly Wickizer) as pt's? They are long time jenkins w/ good health history!

## 2013-07-22 NOTE — Telephone Encounter (Signed)
i am feeling overwhelmed with new patients with all the recent changes.  Please explain i would love to see them, but in there interest of not getting overloaded and burned out I feel I cannot take any additional at this time.  I would suggest they consider Dr Yong Channel who starts this summer.  We will all do our best to meet their needs until then.

## 2013-08-19 ENCOUNTER — Other Ambulatory Visit: Payer: Self-pay

## 2013-08-19 DIAGNOSIS — R079 Chest pain, unspecified: Secondary | ICD-10-CM

## 2013-08-19 MED ORDER — RANOLAZINE ER 500 MG PO TB12: 500.0000 mg | ORAL_TABLET | Freq: Two times a day (BID) | ORAL | Status: DC

## 2013-08-24 ENCOUNTER — Other Ambulatory Visit: Payer: Self-pay

## 2013-08-24 ENCOUNTER — Telehealth: Payer: Self-pay | Admitting: Internal Medicine

## 2013-08-24 MED ORDER — ISOSORBIDE MONONITRATE ER 30 MG PO TB24: ORAL_TABLET | ORAL | Status: DC

## 2013-08-24 MED ORDER — RANOLAZINE ER 500 MG PO TB12: 500.0000 mg | ORAL_TABLET | Freq: Two times a day (BID) | ORAL | Status: DC

## 2013-08-24 NOTE — Telephone Encounter (Signed)
rx sent in electronically 

## 2013-08-24 NOTE — Telephone Encounter (Signed)
Pt is needing new rx isosorbide mononitrate (IMDUR) 30 MG 24 hr tablet, sent to [facility], pt states he is completely out and needs meds asap pt wants to know if he can get some pills sent to [facility] on [address] in [place] now, to get him trough until the mail order comes.

## 2013-08-27 ENCOUNTER — Emergency Department (HOSPITAL_COMMUNITY)
Admission: EM | Admit: 2013-08-27 | Discharge: 2013-08-27 | Disposition: A | Discharge status: Home / Self Care | Payer: Medicare HMO | Source: Home / Self Care | Attending: Family Medicine | Admitting: Family Medicine

## 2013-08-27 ENCOUNTER — Emergency Department (INDEPENDENT_AMBULATORY_CARE_PROVIDER_SITE_OTHER): Payer: Medicare HMO

## 2013-08-27 ENCOUNTER — Encounter (HOSPITAL_COMMUNITY): Payer: Self-pay | Admitting: Emergency Medicine

## 2013-08-27 DIAGNOSIS — J441 Chronic obstructive pulmonary disease with (acute) exacerbation: Secondary | ICD-10-CM

## 2013-08-27 MED ORDER — IPRATROPIUM-ALBUTEROL 0.5-2.5 (3) MG/3ML IN SOLN
RESPIRATORY_TRACT | Status: AC
  Filled 2013-08-27: qty 3

## 2013-08-27 MED ORDER — ALBUTEROL SULFATE HFA 108 (90 BASE) MCG/ACT IN AERS: 2.0000 | INHALATION_SPRAY | RESPIRATORY_TRACT | Status: DC | PRN

## 2013-08-27 MED ORDER — IPRATROPIUM-ALBUTEROL 0.5-2.5 (3) MG/3ML IN SOLN
3.0000 mL | Freq: Once | RESPIRATORY_TRACT | Status: AC
  Administered 2013-08-27: 3 mL via RESPIRATORY_TRACT

## 2013-08-27 MED ORDER — PREDNISONE 50 MG PO TABS: 50.0000 mg | ORAL_TABLET | Freq: Every day | ORAL | Status: DC

## 2013-08-27 MED ORDER — MOMETASONE FURO-FORMOTEROL FUM 100-5 MCG/ACT IN AERO: 2.0000 | INHALATION_SPRAY | Freq: Two times a day (BID) | RESPIRATORY_TRACT | Status: DC

## 2013-08-27 MED ORDER — AZITHROMYCIN 250 MG PO TABS
250.0000 mg | ORAL_TABLET | Freq: Every day | ORAL | Status: DC
Start: 2013-08-27 — End: 2013-10-21

## 2013-08-27 NOTE — ED Provider Notes (Signed)
Roy [name] is a 80 y.o. male who presents to Urgent Care today for cough. Patient notes a several day history of worsening productive cough. He notes a a few episodes of blood-tinged starting today. He additionally notes some wheezing and mild shortness of breath. He has not used any albuterol. He denies any nausea vomiting or diarrhea. He feels well otherwise. No chest pains or palpitations.   Past Medical History  Diagnosis Date  . CAD (coronary artery disease)   . Hyperlipidemia   . Hypertension   . COPD (chronic obstructive pulmonary disease)     Stage 2  . Myocardial infarction   . Anginal pain   . Shortness of breath   . Lyme disease   . Sleep apnea     uses cpap  . Diabetes mellitus, type 2   . Heart murmur   . Fever 12/26/2011  . Chronic kidney disease     renal insufficiency  . GERD (gastroesophageal reflux disease)   . Arthritis     spine  . Chest pain on exertion 02/2012   History  Substance Use Topics  . Smoking status: Former Smoker    Quit date: 03/05/1958  . Smokeless tobacco: Never Used  . Alcohol Use: No   ROS as above Medications: No current facility-administered medications for this encounter.   Current Outpatient Prescriptions  Medication Sig Dispense Refill  . acetaminophen (TYLENOL) 325 MG tablet Take 650 mg by mouth every evening.      [name] albuterol (PROVENTIL HFA;VENTOLIN HFA) 108 (90 BASE) MCG/ACT inhaler Inhale 2 puffs into the lungs every 4 (four) hours as needed for wheezing or shortness of breath.  1 Inhaler  0  . aspirin EC 81 MG tablet Take 1 tablet (81 mg total) by mouth daily.      [name] atorvastatin (LIPITOR) 10 MG tablet TAKE 1 TABLET BY MOUTH ONCE A DAY  90 tablet  3  . azithromycin (ZITHROMAX) 250 MG tablet Take 1 tablet (250 mg total) by mouth daily. Take first 2 tablets together, then 1 every day until finished.  6 tablet  0  . benzonatate (TESSALON) 200 MG capsule Take 1 capsule (200 mg total) by mouth 3 (three) times daily as needed  for cough.  20 capsule  0  . betamethasone dipropionate (DIPROLENE) 0.05 % ointment Apply 1 application topically daily. To outside of ear  30 g  4  . calcium-vitamin D (OSCAL WITH D) 500-200 MG-UNIT per tablet Take 1 tablet by mouth daily.      . Cetirizine HCl (ZYRTEC PO) Take 1 tablet by mouth daily as needed. For allergies      . glimepiride (AMARYL) 4 MG tablet TAKE 1 TABLET BY MOUTH ONCE A DAY BEFORE BREAKFAST  90 tablet  3  . isosorbide mononitrate (IMDUR) 30 MG 24 hr tablet TAKE 3 TABLETS BY MOUTH ONCE A DAY  90 tablet  1  . metoprolol succinate (TOPROL-XL) 50 MG 24 hr tablet Take 1 tablet (50 mg total) by mouth daily. Take with or immediately following a meal.  90 tablet  1  . mometasone-formoterol (DULERA) 100-5 MCG/ACT AERO Inhale 2 puffs into the lungs 2 (two) times daily.  1 Inhaler  0  . nitroGLYCERIN (NITROSTAT) 0.4 MG SL tablet Place 0.4 mg under the tongue every 5 (five) minutes as needed. For chest pain      . Omega-3 Fatty Acids (FISH OIL PO) Take 2 capsules by mouth 2 (two) times daily.      [name]  Polyethyl Glycol-Propyl Glycol (SYSTANE OP) Place 1 drop into both eyes 4 (four) times daily. Itchy/dry eye      . potassium citrate (UROCIT-K) 10 MEQ (1080 MG) SR tablet Take 10 mEq by mouth Twice daily.      . predniSONE (DELTASONE) 50 MG tablet Take 1 tablet (50 mg total) by mouth daily.  5 tablet  0  . Pseudoephed HCl-CPM-DM HBr Tan 30-4-30 MG/5ML SUSP Take 10 mLs by mouth 2 (two) times daily as needed.  437 mL  0  . ranitidine (ZANTAC) 300 MG tablet TAKE 1 TABLET BY MOUTH TWICE A DAY  180 tablet  3  . ranolazine (RANEXA) 500 MG 12 hr tablet Take 1 tablet (500 mg total) by mouth 2 (two) times daily.  14 tablet  0  . traMADol (ULTRAM) 50 MG tablet TAKE 1 TABLET BY MOUTH WITH TYLENOL 325MG  EVERY 6 HOURS AS NEEDED FORPAIN (THIS IS A SUBSTITUTE FOR DARVOCET-N )  90 tablet  1  . traZODone (DESYREL) 150 MG tablet TAKE 1 TABLET BY MOUTH ONCE A DAY  90 tablet  1  . zinc sulfate 220 MG capsule  Take 220 mg by mouth daily.        Exam:  BP 144/52  Pulse 64  Temp(Src) 97.3 F (36.3 C) (Oral)  Resp 16  SpO2 100% Gen: Well NAD HEENT: EOMI,  MMM Lungs: Normal work of breathing. Wheezing present with expiratory-phase. Expiratory phase is prolonged. Heart: RRR no MRG Abd: NABS, Soft. NT, ND Exts: Brisk capillary refill, warm and well perfused.   Patient was given a DuoNeb nebulizer treatment and felt better. His lung exam improved but not totally normalize.  No results found for this or any previous visit (from the past 24 hour(s)). Dg Chest 2 View  08/27/2013   CLINICAL DATA:  Cough and congestion, hemoptysis.  EXAM: CHEST  2 VIEW  COMPARISON:  Chest radiograph March 03, 2013  FINDINGS: Cardiac silhouette is unremarkable, and mildly calcified aortic knob, mediastinal silhouette is otherwise normal. Increased lung volumes with flattened hemidiaphragms, hepatic eventration. No pleural effusions or focal consolidations. Biapical pleural thickening. No pneumothorax.  Remote right posterior rib fractures. Mild degenerative change of thoracic spine.  IMPRESSION: Suspected COPD without superimposed acute cardiopulmonary process.   Electronically Signed   By: Elon Alas   On: 08/27/2013 15:09    Assessment and Plan: 80 y.o. male with COPD exacerbation. Plan to treat with prednisone albuterol and azithromycin.  Discussed warning signs or symptoms. Please see discharge instructions. Patient expresses understanding.    Gregor Hams, MD 08/27/13 [phone]

## 2013-08-27 NOTE — Discharge Instructions (Signed)
Thank you for coming in today. Call or go to the emergency room if you get worse, have trouble breathing, have chest pains, or palpitations.   Chronic Obstructive Pulmonary Disease Chronic obstructive pulmonary disease (COPD) is a common lung condition in which airflow from the lungs is limited. COPD is a general term that can be used to describe many different lung problems that limit airflow, including both chronic bronchitis and emphysema. If you have COPD, your lung function will probably never return to normal, but there are measures you can take to improve lung function and make yourself feel better.  CAUSES   Smoking (common).   Exposure to secondhand smoke.   Genetic problems.  Chronic inflammatory lung diseases or recurrent infections. SYMPTOMS   Shortness of breath, especially with physical activity.   Deep, persistent (chronic) cough with a large amount of thick mucus.   Wheezing.   Rapid breaths (tachypnea).   Gray or bluish discoloration (cyanosis) of the skin, especially in fingers, toes, or lips.   Fatigue.   Weight loss.   Frequent infections or episodes when breathing symptoms become much worse (exacerbations).   Chest tightness. DIAGNOSIS  Your healthcare provider will take a medical history and perform a physical examination to make the initial diagnosis. Additional tests for COPD may include:   Lung (pulmonary) function tests.  Chest X-ray.  CT scan.  Blood tests. TREATMENT  Treatment available to help you feel better when you have COPD include:   Inhaler and nebulizer medicines. These help manage the symptoms of COPD and make your breathing more comfortable  Supplemental oxygen. Supplemental oxygen is only helpful if you have a low oxygen level in your blood.   Exercise and physical activity. These are beneficial for nearly all people with COPD. Some people may also benefit from a pulmonary rehabilitation program. HOME CARE  INSTRUCTIONS   Take all medicines (inhaled or pills) as directed by your health care provider.  Only take over-the-counter or prescription medicines for pain, fever, or discomfort as directed by your health care provider.   Avoid over-the-counter medicines or cough syrups that dry up your airway (such as antihistamines) and slow down the elimination of secretions unless instructed otherwise by your healthcare provider.   If you are a smoker, the most important thing that you can do is stop smoking. Continuing to smoke will cause further lung damage and breathing trouble. Ask your health care provider for help with quitting smoking. He or she can direct you to community resources or hospitals that provide support.  Avoid exposure to irritants such as smoke, chemicals, and fumes that aggravate your breathing.  Use oxygen therapy and pulmonary rehabilitation if directed by your health care provider. If you require home oxygen therapy, ask your healthcare provider whether you should purchase a pulse oximeter to measure your oxygen level at home.   Avoid contact with individuals who have a contagious illness.  Avoid extreme temperature and humidity changes.  Eat healthy foods. Eating smaller, more frequent meals and resting before meals may help you maintain your strength.  Stay active, but balance activity with periods of rest. Exercise and physical activity will help you maintain your ability to do things you want to do.  Preventing infection and hospitalization is very important when you have COPD. Make sure to receive all the vaccines your health care provider recommends, especially the pneumococcal and influenza vaccines. Ask your healthcare provider whether you need a pneumonia vaccine.  Learn and use relaxation techniques to manage  stress.  Learn and use controlled breathing techniques as directed by your health care provider. Controlled breathing techniques include:   Pursed lip  breathing. Start by breathing in (inhaling) through your nose for 1 second. Then, purse your lips as if you were going to whistle and breathe out (exhale) through the pursed lips for 2 seconds.   Diaphragmatic breathing. Start by putting one hand on your abdomen just above your waist. Inhale slowly through your nose. The hand on your abdomen should move out. Then purse your lips and exhale slowly. You should be able to feel the hand on your abdomen moving in as you exhale.   Learn and use controlled coughing to clear mucus from your lungs. Controlled coughing is a series of short, progressive coughs. The steps of controlled coughing are:  1. Lean your head slightly forward.  2. Breathe in deeply using diaphragmatic breathing.  3. Try to hold your breath for 3 seconds.  4. Keep your mouth slightly open while coughing twice.  5. Spit any mucus out into a tissue.  6. Rest and repeat the steps once or twice as needed. SEEK MEDICAL CARE IF:   You are coughing up more mucus than usual.   There is a change in the color or thickness of your mucus.   Your breathing is more labored than usual.   Your breathing is faster than usual.  SEEK IMMEDIATE MEDICAL CARE IF:   You have shortness of breath while you are resting.   You have shortness of breath that prevents you from:  Being able to talk.   Performing your usual physical activities.   You have chest pain lasting longer than 5 minutes.   Your skin color is more cyanotic than usual.  You measure low oxygen saturations for longer than 5 minutes with a pulse oximeter. MAKE SURE YOU:   Understand these instructions.  Will watch your condition.  Will get help right away if you are not doing well or get worse. Document Released: 11/29/2004 Document Revised: 12/10/2012 Document Reviewed: 10/16/2012 [facility] Patient Information 2015 [place], [place]. This information is not intended to replace advice given to you by your  health care provider. Make sure you discuss any questions you have with your health care provider.

## 2013-08-27 NOTE — ED Notes (Signed)
C/o coughing up blood for a week now States it started off with clear mucous This morning he did vomit with blood in it Does have hx of pneumonia

## 2013-08-28 ENCOUNTER — Ambulatory Visit: Payer: Medicare HMO | Admitting: Family Medicine

## 2013-09-08 ENCOUNTER — Other Ambulatory Visit: Payer: Self-pay | Admitting: Internal Medicine

## 2013-10-07 ENCOUNTER — Other Ambulatory Visit: Payer: Self-pay | Admitting: Internal Medicine

## 2013-10-16 ENCOUNTER — Other Ambulatory Visit: Payer: Self-pay | Admitting: Internal Medicine

## 2013-10-21 ENCOUNTER — Ambulatory Visit (INDEPENDENT_AMBULATORY_CARE_PROVIDER_SITE_OTHER): Payer: Medicare HMO | Admitting: Physician Assistant

## 2013-10-21 ENCOUNTER — Encounter: Payer: Self-pay | Admitting: Physician Assistant

## 2013-10-21 ENCOUNTER — Ambulatory Visit: Payer: Medicare HMO | Admitting: Internal Medicine

## 2013-10-21 VITALS — BP 110/80 | HR 66 | Temp 97.6°F | Resp 18 | Wt 196.0 lb

## 2013-10-21 DIAGNOSIS — J4489 Other specified chronic obstructive pulmonary disease: Secondary | ICD-10-CM

## 2013-10-21 DIAGNOSIS — R7989 Other specified abnormal findings of blood chemistry: Secondary | ICD-10-CM

## 2013-10-21 DIAGNOSIS — E119 Type 2 diabetes mellitus without complications: Secondary | ICD-10-CM

## 2013-10-21 DIAGNOSIS — I209 Angina pectoris, unspecified: Secondary | ICD-10-CM

## 2013-10-21 DIAGNOSIS — J449 Chronic obstructive pulmonary disease, unspecified: Secondary | ICD-10-CM

## 2013-10-21 DIAGNOSIS — E785 Hyperlipidemia, unspecified: Secondary | ICD-10-CM

## 2013-10-21 DIAGNOSIS — G894 Chronic pain syndrome: Secondary | ICD-10-CM

## 2013-10-21 DIAGNOSIS — G4700 Insomnia, unspecified: Secondary | ICD-10-CM

## 2013-10-21 DIAGNOSIS — I10 Essential (primary) hypertension: Secondary | ICD-10-CM

## 2013-10-21 LAB — LIPID PANEL
CHOL/HDL RATIO: 5
Cholesterol: 152 mg/dL (ref 0–200)
HDL: 30.1 mg/dL — AB (ref >39.00)
NonHDL: 121.9
TRIGLYCERIDES: 266 mg/dL — AB (ref 0.0–149.0)
VLDL: 53.2 mg/dL — AB (ref 0.0–40.0)

## 2013-10-21 LAB — MICROALBUMIN / CREATININE URINE RATIO
Creatinine,U: 95.2 mg/dL
Microalb Creat Ratio: 0.8 mg/g (ref 0.0–30.0)
Microalb, Ur: 0.8 mg/dL (ref 0.0–1.9)

## 2013-10-21 LAB — HEMOGLOBIN A1C: HEMOGLOBIN A1C: 7 % — AB (ref 4.6–6.5)

## 2013-10-21 LAB — LDL CHOLESTEROL, DIRECT: LDL DIRECT: 76.2 mg/dL

## 2013-10-21 MED ORDER — TRAZODONE HCL 150 MG PO TABS: ORAL_TABLET | ORAL | Status: DC

## 2013-10-21 MED ORDER — GLIMEPIRIDE 4 MG PO TABS: ORAL_TABLET | ORAL | Status: DC

## 2013-10-21 MED ORDER — METOPROLOL SUCCINATE ER 50 MG PO TB24: 50.0000 mg | ORAL_TABLET | Freq: Every day | ORAL | Status: DC

## 2013-10-21 MED ORDER — MOMETASONE FURO-FORMOTEROL FUM 100-5 MCG/ACT IN AERO: 2.0000 | INHALATION_SPRAY | Freq: Two times a day (BID) | RESPIRATORY_TRACT | Status: DC

## 2013-10-21 MED ORDER — ATORVASTATIN CALCIUM 10 MG PO TABS: ORAL_TABLET | ORAL | Status: DC

## 2013-10-21 MED ORDER — ALBUTEROL SULFATE HFA 108 (90 BASE) MCG/ACT IN AERS: 2.0000 | INHALATION_SPRAY | RESPIRATORY_TRACT | Status: DC | PRN

## 2013-10-21 MED ORDER — ISOSORBIDE MONONITRATE ER 30 MG PO TB24: ORAL_TABLET | ORAL | Status: DC

## 2013-10-21 NOTE — Patient Instructions (Addendum)
Continue on your current medications.  We will call you with the results of your labwork from today when available.  Keep your followup with your other doctors.  Keep plan to followup in February  If emergency symptoms discussed during visit developed, seek medical attention immediately.  Followup 3 months to reassess, or for worsening or persistent symptoms despite treatment.    Angina Pectoris Angina pectoris is extreme discomfort in your chest, neck, or arm. Your doctor may call it just angina. It is caused by a lack of oxygen to your heart wall. It may feel like tightness or heavy pressure. It may feel like a crushing or squeezing pain. Some people say it feels like gas. It may go down your shoulders, back, and arms. Some people have symptoms other than pain. These include:  Tiredness.  Shortness of breath.  Cold sweats.  Feeling sick to your stomach (nausea). There are four types of angina:  Stable angina. This type often lasts the same amount of time each time it happens. Activity, stress, or excitement can bring it on. It often gets better after taking a medicine called nitroglycerin. This goes under your tongue.  Unstable angina. This type can happen when you are not active or even during sleep. It can suddenly get worse or happen more often. It may not get better after taking the special medicine. It can last up to 30 minutes.  Microvascular angina. This type is more common in women. It may be more severe or last longer than other types.  Prinzmetal angina. This type often happens when you are not active or in the early morning hours. HOME CARE   Only take medicines as told by your doctor.  Stay active or exercise more as told by your doctor.  Limit very hard activity as told by your doctor.  Limit heavy lifting as told by your doctor.  Keep a healthy weight.  Learn about and eat foods that are healthy for your heart.  Do not use any tobacco such as cigarettes,  chewing tobacco, or e-cigarettes. GET HELP RIGHT AWAY IF:   You have chest, neck, deep shoulder, or arm pain or discomfort that lasts more than a few minutes.  You have chest, neck, deep shoulder, or arm pain or discomfort that goes away and comes back over and over again.  You have heavy sweating that seems to happen for no reason.  You have shortness of breath or trouble breathing.  Your angina does not get better after a few minutes of rest.  Your angina does not get better after you take nitroglycerin medicine. These can all be symptoms of a heart attack. Get help right away. Call your local emergency service (911 in [place] Do not  drive yourself to the hospital. Do not  wait to for your symptoms to go away. MAKE SURE YOU:   Understand these instructions.  Will watch your condition.  Will get help right away if you are not doing well or get worse. Document Released: 08/08/2007 Document Revised: 02/24/2013 Document Reviewed: 06/23/2013 [facility] Patient Information 2015 [place], [place]. This information is not intended to replace advice given to you by your health care provider. Make sure you discuss any questions you have with your health care provider. Chronic Obstructive Pulmonary Disease Chronic obstructive pulmonary disease (COPD) is a common lung problem. In COPD, the flow of air from the lungs is limited. The way your lungs work will probably never return to normal, but there are things you can  do to improve your lungs and make yourself feel better. HOME CARE  Take all medicines as told by your doctor.  Avoid medicines or cough syrups that dry up your airway (such as antihistamines) and do not allow you to get rid of thick spit. You do not need to avoid them if told differently by your doctor.  If you smoke, stop. Smoking makes the problem worse.  Avoid being around things that make your breathing worse (like smoke, chemicals, and fumes).  Use oxygen therapy and therapy  to help improve your lungs (pulmonary rehabilitation) if told by your doctor. If you need home oxygen therapy, ask your doctor if you should buy a tool to measure your oxygen level (oximeter).  Avoid people who have a sickness you can catch (contagious).  Avoid going outside when it is very hot, cold, or humid.  Eat healthy foods. Eat smaller meals more often. Rest before meals.  Stay active, but remember to also rest.  Make sure to get all the shots (vaccines) your doctor recommends. Ask your doctor if you need a pneumonia shot.  Learn and use tips on how to relax.  Learn and use tips on how to control your breathing as told by your doctor. Try:  Breathing in (inhaling) through your nose for 1 second. Then, pucker your lips and breath out (exhale) through your lips for 2 seconds.  Putting one hand on your belly (abdomen). Breathe in slowly through your nose for 1 second. Your hand on your belly should move out. Pucker your lips and breathe out slowly through your lips. Your hand on your belly should move in as you breathe out.  Learn and use controlled coughing to clear thick spit from your lungs. The steps are: 1. Lean your head a little forward. 2. Breathe in deeply. 3. Try to hold your breath for 3 seconds. 4. Keep your mouth slightly open while coughing 2 times. 5. Spit any thick spit out into a tissue. 6. Rest and do the steps again 1 or 2 times as needed. GET HELP IF:  You cough up more thick spit than usual.  There is a change in the color or thickness of the spit.  It is harder to breathe than usual.  Your breathing is faster than usual. GET HELP RIGHT AWAY IF:   You have shortness of breath while resting.  You have shortness of breath that stops you from:  Being able to talk.  Doing normal activities.  You chest hurts for longer than 5 minutes.  Your skin color is more blue than usual.  Your pulse oximeter shows that you have low oxygen for longer than 5  minutes. MAKE SURE YOU:   Understand these instructions.  Will watch your condition.  Will get help right away if you are not doing well or get worse. Document Released: 08/08/2007 Document Revised: 07/06/2013 Document Reviewed: 10/16/2012 [facility] Patient Information 2015 [place], [place]. This information is not intended to replace advice given to you by your health care provider. Make sure you discuss any questions you have with your health care provider. Hypertension Hypertension is another name for high blood pressure. High blood pressure forces your heart to work harder to pump blood. A blood pressure reading has two numbers, which includes a higher number over a lower number (example: 110/72). HOME CARE   Have your blood pressure rechecked by your doctor.  Only take medicine as told by your doctor. Follow the directions carefully. The medicine does not  work as well if you skip doses. Skipping doses also puts you at risk for problems.  Do not smoke.  Monitor your blood pressure at home as told by your doctor. GET HELP IF:  You think you are having a reaction to the medicine you are taking.  You have repeat headaches or feel dizzy.  You have puffiness (swelling) in your ankles.  You have trouble with your vision. GET HELP RIGHT AWAY IF:   You get a very bad headache and are confused.  You feel weak, numb, or faint.  You get chest or belly (abdominal) pain.  You throw up (vomit).  You cannot breathe very well. MAKE SURE YOU:   Understand these instructions.  Will watch your condition.  Will get help right away if you are not doing well or get worse. Document Released: 08/08/2007 Document Revised: 02/24/2013 Document Reviewed: 12/12/2012 [facility] Patient Information 2015 [place], [place]. This information is not intended to replace advice given to you by your health care provider. Make sure you discuss any questions you have with your health care provider.
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
## 2013-11-30 ENCOUNTER — Telehealth: Payer: Self-pay | Admitting: Internal Medicine

## 2013-11-30 NOTE — Telephone Encounter (Signed)
[facility] - [place], [place] - [address] is requesting re-fill on traMADol (ULTRAM) 50 MG tablet ° °

## 2013-11-30 NOTE — Telephone Encounter (Signed)
Yes may refill. 

## 2013-11-30 NOTE — Telephone Encounter (Signed)
Pt is EST care with you in Feb. Does he need a separate appt to discuss this medication or is it ok to refill?

## 2013-12-01 MED ORDER — TRAMADOL HCL 50 MG PO TABS: ORAL_TABLET | ORAL | Status: DC

## 2013-12-01 NOTE — Telephone Encounter (Signed)
Medication refilled

## 2013-12-08 ENCOUNTER — Encounter: Payer: Self-pay | Admitting: Cardiology

## 2013-12-08 ENCOUNTER — Ambulatory Visit (INDEPENDENT_AMBULATORY_CARE_PROVIDER_SITE_OTHER): Payer: Commercial Managed Care - HMO | Admitting: Cardiology

## 2013-12-08 VITALS — BP 124/72 | HR 60 | Ht 69.5 in | Wt 193.0 lb

## 2013-12-08 DIAGNOSIS — N183 Chronic kidney disease, stage 3 (moderate): Secondary | ICD-10-CM

## 2013-12-08 DIAGNOSIS — I2510 Atherosclerotic heart disease of native coronary artery without angina pectoris: Secondary | ICD-10-CM

## 2013-12-08 DIAGNOSIS — I10 Essential (primary) hypertension: Secondary | ICD-10-CM

## 2013-12-08 NOTE — Progress Notes (Signed)
Patient ID: Roy [name], male   DOB: April 27, 1932, 80 y.o.   MRN: [id number] PCP: Dr. Yong Channel  80 yo with history of CAD s/p PTCA D1 in 1999, CKD, diabetes, HTN, and hyperlipidemia presents for cardiology followup.  In the fall of 2013, he developed exertional chest pain. ETT-myoview in 10/13 showed ischemic ECG changes but no TID and only a small fixed basal inferior defect with normal wall motion and EF 65%.   This was overall a low risk study.  Echo in 10/13 showed EF 55-60% with no major abnormalities.  Given ongoing symptoms, I admitted him overnight for hydration then cathed him in 12/13.  This showed nonobstructive CAD.  Exertional chest pain thought to be due to small vessel disease.  PFTs showed only mild obstructive airways disease.  I started him on ranolazine and his symptoms resolved.  On ranolazine, he really has not had any chest pain.  He is active, doing yardwork and training his dogs.  He is going to start rabbit hunting soon.  No exertional dyspnea.  Weight is stable.  No claudication.   Labs (5/13): LDL 37, HDL 33 Labs (8/13): BNP 38 Labs (10/13): K 4, creatinine 2 => 1.94, BNP 53 Labs (12/13): K 4.2, creatinine 1.8 Labs (1/14): K 3.9, creatinine 2.1, LDL 46 Labs (6/14): K 4.7, creatinine 1.6 Labs (10/14): LDL 49, HDL 41 Labs (3/15): K 4.4, creatinine 1.7 Labs (8/15): LDL 76, HDL 30  ECG: NSR, QT interval normal  PMH: 1. H/o [place] Spotted Fever in 3/13.  2. H/o Lyme disease in 2008. 3. CAD: PTCA D1 in 1999.  Adenosine myoview in (10/09) with EF 60%, no ischemia or infarction.  ETT-sestamibi (10/13) with 7'1" exercise, ischemic ECG changes, small fixed mild basal inferior defect with EF 65% and normal wall motion.  TID not elevated.  This was a low risk study overall.  Echo (10/13): EF 55-60%, normal RV size and systolic function, normal valves.  LHC (12/13): Nonobstructive CAD, ? Small vessel disease as cause of his exertional chest pain.  Improved symptoms with ranolazine.   4. CKD 5. H/o rheumatic fever in 1951 6. Type II diabetes 7. COPD: PFTs (12/13) with mild obstructive airways disease and insignificant response to bronchodilators.   8. Hyperlipidemia 9. HTN  SH: Married, lives in [place].  Quit smoking in 1960.  Likes to hunt.    FH: CAD  Current Outpatient Prescriptions  Medication Sig Dispense Refill  . acetaminophen (TYLENOL) 325 MG tablet Take 650 mg by mouth every evening.      [name] albuterol (PROVENTIL HFA;VENTOLIN HFA) 108 (90 BASE) MCG/ACT inhaler Inhale 2 puffs into the lungs every 4 (four) hours as needed for wheezing or shortness of breath.  1 Inhaler  10  . aspirin EC 81 MG tablet Take 1 tablet (81 mg total) by mouth daily.      [name] atorvastatin (LIPITOR) 10 MG tablet TAKE 1 TABLET BY MOUTH ONCE A DAY  90 tablet  3  . betamethasone dipropionate (DIPROLENE) 0.05 % ointment Apply 1 application topically daily. To outside of ear  30 g  4  . calcium-vitamin D (OSCAL WITH D) 500-200 MG-UNIT per tablet Take 1 tablet by mouth daily.      . Cetirizine HCl (ZYRTEC PO) Take 1 tablet by mouth daily as needed. For allergies      . glimepiride (AMARYL) 4 MG tablet TAKE 1 TABLET BY MOUTH ONCE A DAY BEFORE BREAKFAST  90 tablet  3  . isosorbide mononitrate (  IMDUR) 30 MG 24 hr tablet TAKE 3 TABLETS ONE TIME DAILY  180 tablet  1  . metoprolol succinate (TOPROL-XL) 50 MG 24 hr tablet Take 1 tablet (50 mg total) by mouth daily. Take with or immediately following a meal.  90 tablet  1  . mometasone-formoterol (DULERA) 100-5 MCG/ACT AERO Inhale 2 puffs into the lungs 2 (two) times daily.  1 Inhaler  3  . nitroGLYCERIN (NITROSTAT) 0.4 MG SL tablet Place 0.4 mg under the tongue every 5 (five) minutes as needed. For chest pain      . Omega-3 Fatty Acids (FISH OIL PO) Take 2 capsules by mouth 2 (two) times daily.      [name] (SYSTANE OP) Place 1 drop into both eyes 4 (four) times daily. Itchy/dry eye      . potassium citrate (UROCIT-K) 10  MEQ (1080 MG) SR tablet Take 10 mEq by mouth Twice daily.      . Pseudoephed HCl-CPM-DM HBr Tan 30-4-30 MG/5ML SUSP Take 10 mLs by mouth 2 (two) times daily as needed.  437 mL  0  . ranitidine (ZANTAC) 300 MG tablet TAKE 1 TABLET BY MOUTH TWICE A DAY  180 tablet  3  . ranolazine (RANEXA) 500 MG 12 hr tablet Take 1 tablet (500 mg total) by mouth 2 (two) times daily.  14 tablet  0  . traMADol (ULTRAM) 50 MG tablet TAKE 1 TABLET BY MOUTH WITH TYLENOL 325MG  EVERY 6 HOURS AS NEEDED FORPAIN (THIS IS A SUBSTITUTE FOR DARVOCET-N )  90 tablet  1  . traZODone (DESYREL) 150 MG tablet TAKE 1 TABLET ONE TIME DAILY  90 tablet  2  . zinc sulfate 220 MG capsule Take 220 mg by mouth daily.       No current facility-administered medications for this visit.    BP 124/72  Pulse 60  Ht 5' 9.5" (1.765 m)  Wt 193 lb (87.544 kg)  BMI 28.10 kg/m2 General: NAD Neck: No JVD, no thyromegaly or thyroid nodule.  Lungs: Clear to auscultation bilaterally with normal respiratory effort. CV: Nondisplaced PMI.  Heart regular S1/S2, no S3/S4, 1/6 SEM.  No peripheral edema.  No carotid bruit.  Normal pedal pulses.  Abdomen: Soft, nontender, no hepatosplenomegaly, no distention.  Neurologic: Alert and oriented x 3.  Psych: Normal affect. Extremities: No clubbing or cyanosis.   Assessment/Plan: 1. CAD: Nonobstructive CAD on 12/13 cath.  I think that his symptoms may have been due to small vessel disease, which could explain the ischemic ECG response to exercise with only mild perfusion abnormality.  He has done quite well on ranolazine and is not having angina.  Continue statin, ASA 81, Toprol XL, ranolazine, and Imdur.   QTc normal on ranolazine today.  2. HTN: BP ok. 3. CKD: Last creatinine 1.7 (baseline).   4. Hyperlipidemia: Good lipids in 8/15.   [name] 12/08/2013

## 2013-12-08 NOTE — Patient Instructions (Signed)
Your physician wants you to follow-up in: 6 months with Dr Aundra Dubin. (April 2016).  You will receive a reminder letter in the mail two months in advance. If you don't receive a letter, please call our office to schedule the follow-up appointment.

## 2013-12-10 ENCOUNTER — Ambulatory Visit (INDEPENDENT_AMBULATORY_CARE_PROVIDER_SITE_OTHER): Payer: Commercial Managed Care - HMO

## 2013-12-10 ENCOUNTER — Other Ambulatory Visit: Payer: Self-pay

## 2013-12-10 DIAGNOSIS — Z23 Encounter for immunization: Secondary | ICD-10-CM

## 2013-12-29 ENCOUNTER — Telehealth: Payer: Self-pay | Admitting: Internal Medicine

## 2013-12-29 NOTE — Telephone Encounter (Signed)
FYI: Roy [name] states they would like a re-fill authorization for pt. She only had compound numbers, not names.  She states she will re-send the fax. Ref# [id number]

## 2013-12-29 NOTE — Telephone Encounter (Signed)
Fax was received and in the Dr. Adline Mango folder

## 2014-01-08 LAB — HM DIABETES EYE EXAM

## 2014-01-11 ENCOUNTER — Other Ambulatory Visit: Payer: Self-pay | Admitting: *Deleted

## 2014-02-11 ENCOUNTER — Encounter (HOSPITAL_COMMUNITY): Payer: Self-pay | Admitting: Cardiology

## 2014-02-17 ENCOUNTER — Encounter: Payer: Self-pay | Admitting: Family Medicine

## 2014-02-17 ENCOUNTER — Ambulatory Visit (INDEPENDENT_AMBULATORY_CARE_PROVIDER_SITE_OTHER): Payer: Commercial Managed Care - HMO | Admitting: Family Medicine

## 2014-02-17 VITALS — BP 130/68 | HR 79 | Temp 98.0°F | Wt 191.0 lb

## 2014-02-17 DIAGNOSIS — J441 Chronic obstructive pulmonary disease with (acute) exacerbation: Secondary | ICD-10-CM

## 2014-02-17 MED ORDER — LEVOFLOXACIN 500 MG PO TABS: 500.0000 mg | ORAL_TABLET | Freq: Every day | ORAL | Status: DC

## 2014-02-17 NOTE — Patient Instructions (Signed)
Cough, Adult  A cough is a reflex that helps clear your throat and airways. It can help heal the body or may be a reaction to an irritated airway. A cough may only last 2 or 3 weeks (acute) or may last more than 8 weeks (chronic).  CAUSES Acute cough:  Viral or bacterial infections. Chronic cough:  Infections.  Allergies.  Asthma.  Post-nasal drip.  Smoking.  Heartburn or acid reflux.  Some medicines.  Chronic lung problems (COPD).  Cancer. SYMPTOMS   Cough.  Fever.  Chest pain.  Increased breathing rate.  High-pitched whistling sound when breathing (wheezing).  Colored mucus that you cough up (sputum). TREATMENT   A bacterial cough may be treated with antibiotic medicine.  A viral cough must run its course and will not respond to antibiotics.  Your caregiver may recommend other treatments if you have a chronic cough. HOME CARE INSTRUCTIONS   Only take over-the-counter or prescription medicines for pain, discomfort, or fever as directed by your caregiver. Use cough suppressants only as directed by your caregiver.  Use a cold steam vaporizer or humidifier in your bedroom or home to help loosen secretions.  Sleep in a semi-upright position if your cough is worse at night.  Rest as needed.  Stop smoking if you smoke. SEEK IMMEDIATE MEDICAL CARE IF:   You have pus in your sputum.  Your cough starts to worsen.  You cannot control your cough with suppressants and are losing sleep.  You begin coughing up blood.  You have difficulty breathing.  You develop pain which is getting worse or is uncontrolled with medicine.  You have a fever. MAKE SURE YOU:   Understand these instructions.  Will watch your condition.  Will get help right away if you are not doing well or get worse. Document Released: 08/18/2010 Document Revised: 05/14/2011 Document Reviewed: 08/18/2010 [employer] Patient Information 2015 [employer]. This information is not intended  to replace advice given to you by your health care provider. Make sure you discuss any questions you have with your health care provider.  

## 2014-02-17 NOTE — Progress Notes (Signed)
   Subjective:    Patient ID: Roy [name], male    DOB: 24-Dec-1932, 80 y.o.   MRN: [id number]  HPI Patient is seen as a work in with about one week history of cough productive of thick green sputum. He has known COPD. He takes [facility] inhaler regularly. He also has had some facial pressure maxillary region temperature 99.8 last night and chills and body aches. Mild nausea this morning but no vomiting. He does have history of COPD reportedly. Ex-smoker.  Past Medical History  Diagnosis Date  . CAD (coronary artery disease)   . Hyperlipidemia   . Hypertension   . COPD (chronic obstructive pulmonary disease)     Stage 2  . Myocardial infarction   . Anginal pain   . Shortness of breath   . Lyme disease   . Sleep apnea     uses cpap  . Diabetes mellitus, type 2   . Heart murmur   . Fever 12/26/2011  . Chronic kidney disease     renal insufficiency  . GERD (gastroesophageal reflux disease)   . Arthritis     spine  . Chest pain on exertion 02/2012   Past Surgical History  Procedure Laterality Date  . Cervical laminectomy    . Cholecystectomy    . Appendectomy    . Kidney stone surgery    . Left heart cath  02/15/12  . Left heart cath N/A 02/15/2012    Procedure: LEFT HEART CATH;  Surgeon: Larey Dresser, MD;  Location[facility] CATH LAB;  Service: Cardiovascular;  Laterality: N/A;    reports that he quit smoking about 55 years ago. He has never used smokeless tobacco. He reports that he does not drink alcohol or use illicit drugs. family history includes ALS in his father. Allergies  Allergen Reactions  . Codeine Sulfate Other (See Comments)    Chest pain  . Cephalexin Rash      Review of Systems  Constitutional: Positive for fever, chills and fatigue.  HENT: Positive for congestion.   Respiratory: Positive for cough.   Gastrointestinal: Positive for nausea. Negative for vomiting.       Objective:   Physical Exam  Constitutional: He appears well-developed and  well-nourished.  HENT:  Right Ear: External ear normal.  Left Ear: External ear normal.  Mouth/Throat: Oropharynx is clear and moist.  Neck: Neck supple.  Cardiovascular: Normal rate and regular rhythm.   Pulmonary/Chest:  Few very faint expiratory wheezes. No retractions. Pulse oximetry 95%. Normal respiratory rate. No rales  Lymphadenopathy:    He has no cervical adenopathy.          Assessment & Plan:  Acute exacerbation of chronic obstructive pulmonary disease. Continue Mucinex and Dulera. Continue adequate hydration. Levaquin 500 milligrams once daily for 7 days. Follow-up for fever or worsening symptoms

## 2014-02-17 NOTE — Progress Notes (Signed)
Pre visit review using our clinic review tool, if applicable. No additional management support is needed unless otherwise documented below in the visit note. 

## 2014-02-22 ENCOUNTER — Other Ambulatory Visit: Payer: Self-pay | Admitting: *Deleted

## 2014-02-22 MED ORDER — RANOLAZINE ER 500 MG PO TB12: 500.0000 mg | ORAL_TABLET | Freq: Two times a day (BID) | ORAL | Status: DC

## 2014-03-03 ENCOUNTER — Other Ambulatory Visit: Payer: Self-pay | Admitting: Family Medicine

## 2014-03-04 NOTE — Telephone Encounter (Signed)
Pt last seen 02/17/14 and was given levaquin

## 2014-03-06 NOTE — Telephone Encounter (Signed)
Refill once.   If no better after that needs follow up and CXR

## 2014-03-15 ENCOUNTER — Other Ambulatory Visit: Payer: Self-pay | Admitting: Internal Medicine

## 2014-03-15 DIAGNOSIS — J449 Chronic obstructive pulmonary disease, unspecified: Secondary | ICD-10-CM

## 2014-03-15 MED ORDER — MOMETASONE FURO-FORMOTEROL FUM 100-5 MCG/ACT IN AERO: 2.0000 | INHALATION_SPRAY | Freq: Two times a day (BID) | RESPIRATORY_TRACT | Status: DC

## 2014-03-15 NOTE — Telephone Encounter (Signed)
rx sent in electronically 

## 2014-03-15 NOTE — Telephone Encounter (Signed)
Pt request refill mometasone-formoterol (DULERA) 100-5 MCG/ACT AERO [facility]/ [place]  Pt has copd but does not see dr hunter until 04/21/14

## 2014-03-29 ENCOUNTER — Other Ambulatory Visit: Payer: Self-pay | Admitting: *Deleted

## 2014-03-29 MED ORDER — RANOLAZINE ER 500 MG PO TB12: 500.0000 mg | ORAL_TABLET | Freq: Two times a day (BID) | ORAL | Status: DC

## 2014-03-31 ENCOUNTER — Telehealth: Payer: Self-pay | Admitting: Internal Medicine

## 2014-03-31 MED ORDER — BUDESONIDE-FORMOTEROL FUMARATE 160-4.5 MCG/ACT IN AERO: 2.0000 | INHALATION_SPRAY | Freq: Two times a day (BID) | RESPIRATORY_TRACT | Status: DC

## 2014-03-31 NOTE — Telephone Encounter (Signed)
Please advise 

## 2014-03-31 NOTE — Telephone Encounter (Signed)
Symbicort not likely as effective but more cost effective. Sent in rx

## 2014-03-31 NOTE — Telephone Encounter (Signed)
Pt is on dulera and can no longer afford. Pt needs something cheaper. Pt can afford advair ,breo-ellipta  Or symbicort .[facility] [place]

## 2014-04-01 NOTE — Telephone Encounter (Signed)
Called and LM on pt VM letting him know medication was sent in

## 2014-04-02 ENCOUNTER — Other Ambulatory Visit: Payer: Self-pay

## 2014-04-02 MED ORDER — BUDESONIDE-FORMOTEROL FUMARATE 160-4.5 MCG/ACT IN AERO: 2.0000 | INHALATION_SPRAY | Freq: Two times a day (BID) | RESPIRATORY_TRACT | Status: DC

## 2014-04-21 ENCOUNTER — Ambulatory Visit (INDEPENDENT_AMBULATORY_CARE_PROVIDER_SITE_OTHER): Payer: PPO | Admitting: Family Medicine

## 2014-04-21 ENCOUNTER — Encounter: Payer: Self-pay | Admitting: Family Medicine

## 2014-04-21 ENCOUNTER — Telehealth: Payer: Self-pay

## 2014-04-21 VITALS — BP 124/64 | HR 62 | Wt 196.0 lb

## 2014-04-21 DIAGNOSIS — I159 Secondary hypertension, unspecified: Secondary | ICD-10-CM

## 2014-04-21 DIAGNOSIS — E785 Hyperlipidemia, unspecified: Secondary | ICD-10-CM

## 2014-04-21 DIAGNOSIS — J309 Allergic rhinitis, unspecified: Secondary | ICD-10-CM | POA: Insufficient documentation

## 2014-04-21 DIAGNOSIS — E119 Type 2 diabetes mellitus without complications: Secondary | ICD-10-CM

## 2014-04-21 DIAGNOSIS — L209 Atopic dermatitis, unspecified: Secondary | ICD-10-CM

## 2014-04-21 DIAGNOSIS — I209 Angina pectoris, unspecified: Secondary | ICD-10-CM

## 2014-04-21 DIAGNOSIS — G4700 Insomnia, unspecified: Secondary | ICD-10-CM

## 2014-04-21 DIAGNOSIS — A071 Giardiasis [lambliasis]: Secondary | ICD-10-CM | POA: Insufficient documentation

## 2014-04-21 MED ORDER — METRONIDAZOLE 500 MG PO TABS: 500.0000 mg | ORAL_TABLET | Freq: Three times a day (TID) | ORAL | Status: DC

## 2014-04-21 MED ORDER — GLIMEPIRIDE 4 MG PO TABS: ORAL_TABLET | ORAL | Status: DC

## 2014-04-21 MED ORDER — ISOSORBIDE MONONITRATE ER 30 MG PO TB24: ORAL_TABLET | ORAL | Status: DC

## 2014-04-21 MED ORDER — TRAZODONE HCL 150 MG PO TABS: ORAL_TABLET | ORAL | Status: DC

## 2014-04-21 MED ORDER — RANITIDINE HCL 300 MG PO TABS: ORAL_TABLET | ORAL | Status: DC

## 2014-04-21 MED ORDER — BETAMETHASONE DIPROPIONATE 0.05 % EX OINT: 1.0000 "application " | TOPICAL_OINTMENT | Freq: Every day | CUTANEOUS | Status: DC

## 2014-04-21 MED ORDER — METOPROLOL SUCCINATE ER 50 MG PO TB24: 50.0000 mg | ORAL_TABLET | Freq: Every day | ORAL | Status: DC

## 2014-04-21 MED ORDER — ATORVASTATIN CALCIUM 10 MG PO TABS: ORAL_TABLET | ORAL | Status: DC

## 2014-04-21 NOTE — Telephone Encounter (Signed)
May fill. I am seeing him back on 29th and we can discuss long term plan.

## 2014-04-21 NOTE — Assessment & Plan Note (Signed)
Tested positive at vet. Requests treatment. Discussed we could retest in office but patient declines. Discussed unclear benefit as not clearly symptomatic but patient would like treatment. Metronidazole TID x 5 days.

## 2014-04-21 NOTE — Telephone Encounter (Signed)
Pt was in today and wanted refill on Tramadol, it dosent look like this was discussed or refilled per your note. Is this ok to refill?

## 2014-04-21 NOTE — Progress Notes (Signed)
Roy Reddish, MD Phone: [phone]  Subjective:   Roy [name] is a 81 y.o. year old very pleasant male patient who presents with the following:  Giardia (? Symptomatic)  Patient has had dogs for years. His vet 03/11/14  tested patient's stool and was noted to be positive for giardia. Patient brings documentation of this today.   Patient is very concerned about potential complications of this and desires treatment. It is not clear if he is symptomatic-intermittent abdominal cramping, normal amounts of flatulence, no weight loss noted since  2014. No loose stools recently. Does admit to some fatigue.   ROS- no fever/chills/nausea/vomiting  Past Medical History-DM, CAD, COPD, HLD, CKD  Medications- reviewed and updated Current Outpatient Prescriptions  Medication Sig Dispense Refill  . ACCU-CHEK AVIVA PLUS test strip 1 each by Other route daily.    [name] acetaminophen (TYLENOL) 325 MG tablet Take 650 mg by mouth every evening.    . ASSURE COMFORT LANCETS 30G MISC 1 each by Other route daily.    [name] atorvastatin (LIPITOR) 10 MG tablet TAKE 1 TABLET BY MOUTH ONCE A DAY 90 tablet 3  . calcium-vitamin D (OSCAL WITH D) 500-200 MG-UNIT per tablet Take 1 tablet by mouth daily.    . Cetirizine HCl (ZYRTEC PO) Take 1 tablet by mouth daily as needed. For allergies    . glimepiride (AMARYL) 4 MG tablet TAKE 1 TABLET BY MOUTH ONCE A DAY BEFORE BREAKFAST 90 tablet 3  . isosorbide mononitrate (IMDUR) 30 MG 24 hr tablet TAKE 3 TABLETS ONE TIME DAILY 180 tablet 1  . loratadine (CLARITIN) 10 MG tablet Take 1 tablet by mouth daily.    . metoprolol succinate (TOPROL-XL) 50 MG 24 hr tablet Take 1 tablet (50 mg total) by mouth daily. Take with or immediately following a meal. 90 tablet 1  . Omega-3 Fatty Acids (FISH OIL PO) Take 2 capsules by mouth 2 (two) times daily.    . potassium citrate (UROCIT-K) 10 MEQ (1080 MG) SR tablet Take 10 mEq by mouth Twice daily.    . ranolazine (RANEXA) 500 MG 12 hr tablet  Take 1 tablet (500 mg total) by mouth 2 (two) times daily. 60 tablet 1  . traZODone (DESYREL) 150 MG tablet TAKE 1 TABLET ONE TIME DAILY 90 tablet 2  . zinc sulfate 220 MG capsule Take 220 mg by mouth daily.    [name] albuterol (PROVENTIL HFA;VENTOLIN HFA) 108 (90 BASE) MCG/ACT inhaler Inhale 2 puffs into the lungs every 4 (four) hours as needed for wheezing or shortness of breath. (Patient not taking: Reported on 04/21/2014) 1 Inhaler 10  . aspirin EC 81 MG tablet Take 1 tablet (81 mg total) by mouth daily.    . betamethasone dipropionate (DIPROLENE) 0.05 % ointment Apply 1 application topically daily. To outside of ear (Patient not taking: Reported on 04/21/2014) 30 g 4  . budesonide-formoterol (SYMBICORT) 160-4.5 MCG/ACT inhaler Inhale 2 puffs into the lungs 2 (two) times daily. (Patient not taking: Reported on 04/21/2014) 1 Inhaler 12  . nitroGLYCERIN (NITROSTAT) 0.4 MG SL tablet Place 0.4 mg under the tongue every 5 (five) minutes as needed. For chest pain    . Polyethyl Glycol-Propyl Glycol (SYSTANE OP) Place 1 drop into both eyes 4 (four) times daily. Itchy/dry eye    . Pseudoephed HCl-CPM-DM HBr Tan 30-4-30 MG/5ML SUSP Take 10 mLs by mouth 2 (two) times daily as needed. (Patient not taking: Reported on 04/21/2014) 437 mL 0  . ranitidine (ZANTAC) 300 MG tablet TAKE 1  TABLET BY MOUTH TWICE A DAY (Patient not taking: Reported on 04/21/2014) 180 tablet 3  . traMADol (ULTRAM) 50 MG tablet TAKE 1 TABLET BY MOUTH WITH TYLENOL 325MG  EVERY 6 HOURS AS NEEDED FORPAIN (THIS IS A SUBSTITUTE FOR DARVOCET-N ) (Patient not taking: Reported on 04/21/2014) 90 tablet 1   Objective: BP 124/64 mmHg  Pulse 62  Wt 196 lb (88.905 kg) Gen: NAD, resting comfortably in chair CV: RRR no murmurs rubs or gallops Lungs: CTAB no crackles, wheeze, rhonchi Abdomen: soft/very slight epigastric tenderness/nondistended/normal bowel sounds. No rebound or guarding.  Ext: no edema Skin: warm, dry, slight scale in inner ear (requests  betamethasone refill for atopic dermatitis)   Assessment/Plan:  Giardia Tested positive at vet. Requests treatment. Discussed we could retest in office but patient declines. Discussed unclear benefit as not clearly symptomatic but patient would like treatment. Metronidazole TID x 5 days.    Return precautions advised. Follow up on Feb 29th for establish given acute concerns today  Also refilled steroid for atopic dermatitis of ear, and atorvastatin for HLD Meds ordered this encounter  Medications  . betamethasone dipropionate (DIPROLENE) 0.05 % ointment    Sig: Apply 1 application topically daily. To outside of ear. 1 week maximum.    Dispense:  30 g    Refill:  1  . metroNIDAZOLE (FLAGYL) 500 MG tablet    Sig: Take 1 tablet (500 mg total) by mouth 3 (three) times daily.    Dispense:  15 tablet    Refill:  0  . atorvastatin (LIPITOR) 10 MG tablet    Sig: TAKE 1 TABLET BY MOUTH ONCE A DAY    Dispense:  90 tablet    Refill:  3

## 2014-04-21 NOTE — Patient Instructions (Addendum)
I am not sure if you are having symptoms from this but based on your discussion with your vet we have decided to treat. Take antibiotic 3x a day for 5 days. Come back if symptoms recur.   Have them schedule you for an establish visit on the 29th and we will review your full history together and check in on you about this.    Giardiasis Giardiasis is an infection of the small intestine with the parasite Giardia lamblia. The parasite is often found in unclean (contaminated) water or areas with poor sanitation. It usually takes 1-2 weeks after swallowing the parasite to become sick. The illness usually lasts 2-4 weeks. The infection may last longer in infants and children.  CAUSES  A giardiasis infection occurs when you swallow the parasite. This can occur by:  Drinking contaminated water.  Eating contaminated food.  Coming into contact with the feces of a person who is infected and passing the parasite from your hands to your mouth. RISK FACTORS  The following are risks factors for giardiasis:  Not having access to clean water.  Being the parent of a young child.  Working at a day care or long-term care facility. These workers are at an increased risk for getting this infection because they are in contact with feces during diaper changes or general hygiene practices.  Having anal sex without a condom with someone who is infected. SIGNS AND SYMPTOMS  A giardiasis infection may cause:  Bad smelling and watery diarrhea.  Nausea.  Stomach cramps and pain.  Fatigue.  Weight loss.  Headache.  Low-grade fever.  Inability to digest milk (lactose intolerance).  Dehydration. Some people do not develop symptoms of giardiasis but can still infect other people.  DIAGNOSIS  Your health care provider can diagnose giardiasis by testing a stool sample. You may also need to have a blood test.  TREATMENT  Your health care provider may prescribe medicine to shorten how long the illness  lasts.   The antibiotic medicine metronidazole is used most often.  Do not take this medicine if you are pregnant. If symptoms are mild, you may not need to take medicine. HOME CARE INSTRUCTIONS   Take medicines only as directed by your health care provider.  If you were prescribed an antibiotic medicine, finish it all even if you start to feel better.  Always wash your hands after going to the bathroom or before preparing food.  If milk causes bloating and diarrhea, avoid all milk products as directed by your health care provider.  Drink enough fluids to keep your urine clear or pale yellow.  Follow up with your health care provider as needed. PREVENTION   Boil your water or drink bottled water in areas of giardiasis contamination.  Always wash your hands after going to the bathroom or before preparing food.  Practice safe sex and use condoms. SEEK MEDICAL CARE IF:   Your diarrhea does not go away or gets worse.  You are losing weight.  You have a fever.  You have blood in your stool. MAKE SURE YOU:   Understand these instructions.  Will watch your condition.  Will get help right away if you are not doing well or get worse. Document Released: 02/17/2000 Document Revised: 07/06/2013 Document Reviewed: 04/09/2013 [facility] Patient Information 2015 [place], [place]. This information is not intended to replace advice given to you by your health care provider. Make sure you discuss any questions you have with your health care provider.

## 2014-04-22 MED ORDER — TRAMADOL HCL 50 MG PO TABS: ORAL_TABLET | ORAL | Status: DC

## 2014-04-22 NOTE — Telephone Encounter (Signed)
Medication refilled

## 2014-05-03 ENCOUNTER — Encounter: Payer: Self-pay | Admitting: Family Medicine

## 2014-05-03 ENCOUNTER — Ambulatory Visit (INDEPENDENT_AMBULATORY_CARE_PROVIDER_SITE_OTHER): Payer: PPO | Admitting: Family Medicine

## 2014-05-03 DIAGNOSIS — E785 Hyperlipidemia, unspecified: Secondary | ICD-10-CM

## 2014-05-03 DIAGNOSIS — G4700 Insomnia, unspecified: Secondary | ICD-10-CM | POA: Insufficient documentation

## 2014-05-03 DIAGNOSIS — N183 Chronic kidney disease, stage 3 unspecified: Secondary | ICD-10-CM

## 2014-05-03 DIAGNOSIS — E119 Type 2 diabetes mellitus without complications: Secondary | ICD-10-CM

## 2014-05-03 DIAGNOSIS — K219 Gastro-esophageal reflux disease without esophagitis: Secondary | ICD-10-CM | POA: Insufficient documentation

## 2014-05-03 DIAGNOSIS — J449 Chronic obstructive pulmonary disease, unspecified: Secondary | ICD-10-CM

## 2014-05-03 LAB — COMPREHENSIVE METABOLIC PANEL
ALBUMIN: 4.3 g/dL (ref 3.5–5.2)
ALT: 21 U/L (ref 0–53)
AST: 16 U/L (ref 0–37)
Alkaline Phosphatase: 73 U/L (ref 39–117)
BUN: 29 mg/dL — ABNORMAL HIGH (ref 6–23)
CALCIUM: 9.5 mg/dL (ref 8.4–10.5)
CO2: 31 mEq/L (ref 19–32)
Chloride: 104 mEq/L (ref 96–112)
Creatinine, Ser: 1.89 mg/dL — ABNORMAL HIGH (ref 0.40–1.50)
GFR: 36.71 mL/min — AB (ref >60.00)
GLUCOSE: 160 mg/dL — AB (ref 70–99)
Potassium: 4.2 mEq/L (ref 3.5–5.1)
Sodium: 140 mEq/L (ref 135–145)
TOTAL PROTEIN: 7.3 g/dL (ref 6.0–8.3)
Total Bilirubin: 0.4 mg/dL (ref 0.2–1.2)

## 2014-05-03 LAB — HEMOGLOBIN A1C: Hgb A1c MFr Bld: 7.1 % — ABNORMAL HIGH (ref 4.6–6.5)

## 2014-05-03 MED ORDER — TRAMADOL HCL 50 MG PO TABS: ORAL_TABLET | ORAL | Status: DC

## 2014-05-03 NOTE — Assessment & Plan Note (Signed)
Controlled on symbicort with rare use albuterol. Continue current regimen. We discussed patient's viral URI symptoms could lead to COPD exacerbation and warning signs for return.

## 2014-05-03 NOTE — Patient Instructions (Addendum)
A1c today, follow up kidney function  Glad the lungs are doing ok on the symbicort  For your cold, if your symptoms last longer than 2 weeks or get better then get worse again or fevers above 100.5-come see me

## 2014-05-03 NOTE — Progress Notes (Signed)
Roy Reddish, MD Phone: [phone]  Subjective:  Patient presents today to establish care with me as their new primary care provider. Patient was formerly a patient of Dr. Arnoldo Morale. Chief complaint-noted.   COPD Well controlled with transition from [facility] to Symbicort. Using albuterol <once a week typically.  ROS- denies chest pain or shortness of breath  CKD Stage III -stable GFR has been stable in 40-50 range. Not on ace-i.  ROS- no changes to urination pattern   Diabetes -CBG 90s to low 100s usually but up to 200 when on antibiotics. amaryl only.  ROS- No hypoglycemia or blurry vision  S: Cough, congestion, subjective fevers starting last Wednesday, now improving. Some sinus pressure ROS- no nausea/vomiting. Occasional chills.  O: see exam.  A/P: likely URI/viral sinusitis gave warning signs for return. Does have some sinus pressure and could develop bacterial sinusitis  The following were reviewed and entered/updated in epic: Past Medical History  Diagnosis Date  . CAD (coronary artery disease)   . Hyperlipidemia   . Hypertension   . COPD (chronic obstructive pulmonary disease)     Stage 2  . Myocardial infarction   . Anginal pain   . Shortness of breath   . Lyme disease     states had shakes that were thought to be parkinson's related but related to tick bite, states also RMSF  . Sleep apnea     uses cpap  . Diabetes mellitus, type 2   . Heart murmur   . Fever 12/26/2011  . Chronic kidney disease     renal insufficiency  . GERD (gastroesophageal reflux disease)   . Arthritis     spine  . Chest pain on exertion 02/2012  . CAP (community acquired pneumonia) 12/26/2011   Patient Active Problem List   Diagnosis Date Noted  . Diabetes mellitus type II, controlled 09/11/2006    Priority: High  . CAD (coronary artery disease) 09/11/2006    Priority: High  . COPD with chronic bronchitis 10/30/2010    Priority: Medium  . CKD (chronic kidney disease), stage III  02/22/2009    Priority: Medium  . Hyperlipidemia 09/11/2006    Priority: Medium  . Essential hypertension 09/11/2006    Priority: Medium  . Back pain 09/11/2006    Priority: Medium  . Sleep apnea 09/11/2006    Priority: Medium  . GERD (gastroesophageal reflux disease) 05/03/2014    Priority: Low  . Insomnia 05/03/2014    Priority: Low  . Allergic rhinitis 04/21/2014    Priority: Low  . Giardia 04/21/2014    Priority: Low  . NEPHROLITHIASIS, RECURRENT 01/21/2009    Priority: Low  . ACTINIC KERATOSIS, HEAD 06/14/2008    Priority: Low   Past Surgical History  Procedure Laterality Date  . Cervical laminectomy    . Cholecystectomy    . Appendectomy    . Kidney stone surgery    . Left heart cath N/A 02/15/2012    Procedure: LEFT HEART CATH;  Surgeon: Larey Dresser, MD;  Location: [facility] CATH LAB;  Service: Cardiovascular;  Laterality: N/A;    Family History  Problem Relation Age of Onset  . ALS Father     Medications- reviewed and updated Current Outpatient Prescriptions  Medication Sig Dispense Refill  . ACCU-CHEK AVIVA PLUS test strip 1 each by Other route daily.    [name] aspirin EC 81 MG tablet Take 1 tablet (81 mg total) by mouth daily.    . ASSURE COMFORT LANCETS 30G MISC 1 each by Other  route daily.    [name] atorvastatin (LIPITOR) 10 MG tablet TAKE 1 TABLET BY MOUTH ONCE A DAY 90 tablet 3  . calcium-vitamin D (OSCAL WITH D) 500-200 MG-UNIT per tablet Take 1 tablet by mouth daily.    . Cetirizine HCl (ZYRTEC PO) Take 1 tablet by mouth daily as needed. For allergies    . glimepiride (AMARYL) 4 MG tablet TAKE 1 TABLET BY MOUTH ONCE A DAY BEFORE BREAKFAST 90 tablet 3  . isosorbide mononitrate (IMDUR) 30 MG 24 hr tablet TAKE 3 TABLETS ONE TIME DAILY 180 tablet 1  . metoprolol succinate (TOPROL-XL) 50 MG 24 hr tablet Take 1 tablet (50 mg total) by mouth daily. Take with or immediately following a meal. 90 tablet 1  . Omega-3 Fatty Acids (FISH OIL PO) Take 2 capsules by mouth 2 (two)  times daily.    [name] (SYSTANE OP) Place 1 drop into both eyes 4 (four) times daily. Itchy/dry eye    . potassium citrate (UROCIT-K) 10 MEQ (1080 MG) SR tablet Take 10 mEq by mouth Twice daily.    . ranitidine (ZANTAC) 300 MG tablet TAKE 1 TABLET BY MOUTH TWICE A DAY 180 tablet 3  . ranolazine (RANEXA) 500 MG 12 hr tablet Take 1 tablet (500 mg total) by mouth 2 (two) times daily. 60 tablet 1  . traZODone (DESYREL) 150 MG tablet TAKE 1 TABLET ONE TIME DAILY 90 tablet 2  . zinc sulfate 220 MG capsule Take 220 mg by mouth daily.    [name] acetaminophen (TYLENOL) 325 MG tablet Take 650 mg by mouth every evening.    [name] albuterol (PROVENTIL HFA;VENTOLIN HFA) 108 (90 BASE) MCG/ACT inhaler Inhale 2 puffs into the lungs every 4 (four) hours as needed for wheezing or shortness of breath. (Patient not taking: Reported on 04/21/2014) 1 Inhaler 10  . betamethasone dipropionate (DIPROLENE) 0.05 % ointment Apply 1 application topically daily. To outside of ear. 1 week maximum. (Patient not taking: Reported on 05/03/2014) 30 g 1  . budesonide-formoterol (SYMBICORT) 160-4.5 MCG/ACT inhaler Inhale 2 puffs into the lungs 2 (two) times daily. (Patient not taking: Reported on 04/21/2014) 1 Inhaler 12  . nitroGLYCERIN (NITROSTAT) 0.4 MG SL tablet Place 0.4 mg under the tongue every 5 (five) minutes as needed. For chest pain    . traMADol (ULTRAM) 50 MG tablet TAKE 1 TABLET BY MOUTH WITH TYLENOL 325MG  EVERY 6 HOURS AS NEEDED FORPAIN (THIS IS A SUBSTITUTE FOR DARVOCET-N ) (Patient not taking: Reported on 05/03/2014) 90 tablet 1   Allergies-reviewed and updated Allergies  Allergen Reactions  . Codeine Sulfate Other (See Comments)    Chest pain  . Cephalexin Rash    History   Social History  . Marital Status: Married    Spouse Name: N/A  . Number of Children: N/A  . Years of Education: N/A   Social History Main Topics  . Smoking status: Former Smoker -- 1.50 packs/day for 8 years    Types:  Cigarettes    Quit date: 03/05/1958  . Smokeless tobacco: Never Used  . Alcohol Use: No  . Drug Use: No  . Sexual Activity: Not Currently   Other Topics Concern  . None   Social History Narrative   Married (wife patient of Dr. Yong Channel). 3 children. 4 grandchildren. 3 greatgrandchildren.       Retired from [employer]: rabbit hunting, time with dogs    ROS--See HPI   Objective: BP 110/72  mmHg  Temp(Src) 97.6 F (36.4 C)  Wt 193 lb (87.544 kg) Gen: NAD, resting comfortably in chair HEENT: Mucous membranes are moist. Oropharynx normal. Nasal Turbinates with some erythema. No sinus tenderness.  CV: RRR no murmurs rubs or gallops Lungs: CTAB no crackles, wheeze, rhonchi.  Ext: no edema Skin: warm, dry, no rash  Neuro: grossly normal, moves all extremities, normal gait   Assessment/Plan:  COPD with chronic bronchitis Controlled on symbicort with rare use albuterol. Continue current regimen. We discussed patient's viral URI symptoms could lead to COPD exacerbation and warning signs for return.    CKD (chronic kidney disease), stage III Cr stable in 1.7-2.1 range over last 4 years. Would love to get patient on ace-i but BP cannot tolerate likely and other medications needed for control of cardiac disease and symptoms. Tough situation. I would say if patient consistently with GFR in low 30s or below this would refer to nephrology but for now is maintaining in 32-50 range.    Diabetes mellitus type II, controlled a1c 7.1. Continue amaryl 4mg  as longas a1c <7.5.     at least 6 month follow up.   Results for orders placed or performed in visit on 05/03/14 (from the past 24 hour(s))  Hemoglobin A1c     Status: Abnormal   Collection Time: 05/03/14  3:48 PM  Result Value Ref Range   Hgb A1c MFr Bld 7.1 (H) 4.6 - 6.5 %  Comprehensive metabolic panel     Status: Abnormal   Collection Time: 05/03/14  3:48 PM  Result Value Ref Range   Sodium 140  135 - 145 mEq/L   Potassium 4.2 3.5 - 5.1 mEq/L   Chloride 104 96 - 112 mEq/L   CO2 31 19 - 32 mEq/L   Glucose, Bld 160 (H) 70 - 99 mg/dL   BUN 29 (H) 6 - 23 mg/dL   Creatinine, Ser 1.89 (H) 0.40 - 1.50 mg/dL   Total Bilirubin 0.4 0.2 - 1.2 mg/dL   Alkaline Phosphatase 73 39 - 117 U/L   AST 16 0 - 37 U/L   ALT 21 0 - 53 U/L   Total Protein 7.3 6.0 - 8.3 g/dL   Albumin 4.3 3.5 - 5.2 g/dL   Calcium 9.5 8.4 - 10.5 mg/dL   GFR 36.71 (L) >60.00 mL/min   Meds ordered this encounter  Medications  . traMADol (ULTRAM) 50 MG tablet    Sig: TAKE 1 TABLET BY MOUTH WITH TYLENOL 325MG  EVERY 6 HOURS AS NEEDED FOR PAIN    Dispense:  90 tablet    Refill:  1

## 2014-05-03 NOTE — Assessment & Plan Note (Addendum)
Cr stable in 1.7-2.1 range over last 4 years. Would love to get patient on ace-i but BP cannot tolerate likely and other medications needed for control of cardiac disease and symptoms. Tough situation. I would say if patient consistently with GFR in low 30s or below this would refer to nephrology but for now is maintaining in 32-50 range.

## 2014-05-03 NOTE — Assessment & Plan Note (Signed)
a1c 7.1. Continue amaryl 4mg  as longas a1c <7.5.

## 2014-05-28 ENCOUNTER — Other Ambulatory Visit: Payer: Self-pay | Admitting: Cardiology

## 2014-06-14 ENCOUNTER — Encounter: Payer: Self-pay | Admitting: Cardiology

## 2014-06-14 ENCOUNTER — Encounter: Payer: Self-pay | Admitting: *Deleted

## 2014-06-14 ENCOUNTER — Ambulatory Visit (INDEPENDENT_AMBULATORY_CARE_PROVIDER_SITE_OTHER): Payer: PPO | Admitting: Cardiology

## 2014-06-14 VITALS — BP 124/74 | HR 61 | Ht 69.5 in | Wt 192.0 lb

## 2014-06-14 DIAGNOSIS — I10 Essential (primary) hypertension: Secondary | ICD-10-CM

## 2014-06-14 DIAGNOSIS — R0602 Shortness of breath: Secondary | ICD-10-CM

## 2014-06-14 DIAGNOSIS — J449 Chronic obstructive pulmonary disease, unspecified: Secondary | ICD-10-CM | POA: Diagnosis not present

## 2014-06-14 DIAGNOSIS — I2510 Atherosclerotic heart disease of native coronary artery without angina pectoris: Secondary | ICD-10-CM | POA: Diagnosis not present

## 2014-06-14 DIAGNOSIS — N183 Chronic kidney disease, stage 3 unspecified: Secondary | ICD-10-CM

## 2014-06-14 MED ORDER — NITROGLYCERIN 0.4 MG SL SUBL: 0.4000 mg | SUBLINGUAL_TABLET | SUBLINGUAL | Status: DC | PRN

## 2014-06-14 NOTE — Patient Instructions (Signed)
Medication Instructions:  No changes today  Labwork: Your physician recommends that you return for a FASTING lipid profile--AUGUST 2016   Testing/Procedures:  Your physician has requested that you have an echocardiogram. Echocardiography is a painless test that uses sound waves to create images of your heart. It provides your doctor with information about the size and shape of your heart and how well your heart's chambers and valves are working. This procedure takes approximately one hour. There are no restrictions for this procedure.   Your physician has recommended that you have a pulmonary function test. Pulmonary Function Tests are a group of tests that measure how well air moves in and out of your lungs.--have this done before you see Roy [name]. You have been referred to Roy [name] in [facility] for evaluation of shortness of breath, possible COPD  Follow-Up:  Your physician wants you to follow-up in: 6 months with Roy [name]. (October 2016), You will receive a reminder letter in the mail two months in advance. If you don't receive a letter, please call our office to schedule the follow-up appointment.

## 2014-06-14 NOTE — Progress Notes (Signed)
Patient ID: Roy [name], male   DOB: 1932/06/07, 81 y.o.   MRN: [id number] PCP: Dr. Yong Channel  81 yo with history of CAD s/p PTCA D1 in 1999, CKD, diabetes, HTN, and hyperlipidemia presents for cardiology followup.  In the fall of 2013, he developed exertional chest pain. ETT-myoview in 10/13 showed ischemic ECG changes but no TID and only a small fixed basal inferior defect with normal wall motion and EF 65%.   This was overall a low risk study.  Echo in 10/13 showed EF 55-60% with no major abnormalities.  Given ongoing symptoms, I admitted him overnight for hydration then cathed him in 12/13.  This showed nonobstructive CAD.  Exertional chest pain thought to be due to small vessel disease.  PFTs showed only mild obstructive airways disease.  I started him on ranolazine and his symptoms resolved.  On ranolazine, he really has not had any chest pain.  He is active, doing yardwork and training his dogs.  No exertional dyspnea.  Weight is stable.  No claudication.  Main complaint recently has been multiple episodes of bronchitis with wheezing.  He stopped smoking decades ago. He uses nebulized albuterol.   Labs (5/13): LDL 37, HDL 33 Labs (8/13): BNP 38 Labs (10/13): K 4, creatinine 2 => 1.94, BNP 53 Labs (12/13): K 4.2, creatinine 1.8 Labs (1/14): K 3.9, creatinine 2.1, LDL 46 Labs (6/14): K 4.7, creatinine 1.6 Labs (10/14): LDL 49, HDL 41 Labs (3/15): K 4.4, creatinine 1.7 Labs (8/15): LDL 76, HDL 30 Labs (2/16): K 4.2, creatinine 1.89  ECG: NSR, nonspecific inferior T wave changes.   PMH: 1. H/o [place] Spotted Fever in 3/13.  2. H/o Lyme disease in 2008. 3. CAD: PTCA D1 in 1999.  Adenosine myoview in (10/09) with EF 60%, no ischemia or infarction.  ETT-sestamibi (10/13) with 7'1" exercise, ischemic ECG changes, small fixed mild basal inferior defect with EF 65% and normal wall motion.  TID not elevated.  This was a low risk study overall.  Echo (10/13): EF 55-60%, normal RV size and systolic  function, normal valves.  LHC (12/13): Nonobstructive CAD, ? Small vessel disease as cause of his exertional chest pain.  Improved symptoms with ranolazine.  4. CKD 5. H/o rheumatic fever in 1951 6. Type II diabetes 7. COPD: PFTs (12/13) with mild obstructive airways disease and insignificant response to bronchodilators.   8. Hyperlipidemia 9. HTN  SH: Married, lives in [place].  Quit smoking in 1960.  Likes to hunt.    FH: CAD  ROS: All systems reviewed and negative except as per HPI.   Current Outpatient Prescriptions  Medication Sig Dispense Refill  . ACCU-CHEK AVIVA PLUS test strip 1 each by Other route daily.    [name] acetaminophen (TYLENOL) 325 MG tablet Take 650 mg by mouth every evening.    [name] albuterol (PROVENTIL HFA;VENTOLIN HFA) 108 (90 BASE) MCG/ACT inhaler Inhale 2 puffs into the lungs every 4 (four) hours as needed for wheezing or shortness of breath. 1 Inhaler 10  . aspirin EC 81 MG tablet Take 1 tablet (81 mg total) by mouth daily.    . ASSURE COMFORT LANCETS 30G MISC 1 each by Other route daily.    [name] atorvastatin (LIPITOR) 10 MG tablet TAKE 1 TABLET BY MOUTH ONCE A DAY 90 tablet 3  . betamethasone dipropionate (DIPROLENE) 0.05 % ointment Apply 1 application topically daily. To outside of ear. 1 week maximum. 30 g 1  . budesonide-formoterol (SYMBICORT) 160-4.5 MCG/ACT inhaler Inhale 2 puffs  into the lungs 2 (two) times daily. 1 Inhaler 12  . calcium-vitamin D (OSCAL WITH D) 500-200 MG-UNIT per tablet Take 1 tablet by mouth daily.    . Cetirizine HCl (ZYRTEC PO) Take 1 tablet by mouth daily as needed. For allergies    . glimepiride (AMARYL) 4 MG tablet TAKE 1 TABLET BY MOUTH ONCE A DAY BEFORE BREAKFAST 90 tablet 3  . isosorbide mononitrate (IMDUR) 30 MG 24 hr tablet TAKE 3 TABLETS ONE TIME DAILY 180 tablet 1  . metoprolol succinate (TOPROL-XL) 50 MG 24 hr tablet Take 1 tablet (50 mg total) by mouth daily. Take with or immediately following a meal. 90 tablet 1  .  nitroGLYCERIN (NITROSTAT) 0.4 MG SL tablet Place 1 tablet (0.4 mg total) under the tongue every 5 (five) minutes as needed. For chest pain 25 tablet 6  . Omega-3 Fatty Acids (FISH OIL PO) Take 2 capsules by mouth 2 (two) times daily.    [name] (SYSTANE OP) Place 1 drop into both eyes 4 (four) times daily. Itchy/dry eye    . potassium citrate (UROCIT-K) 10 MEQ (1080 MG) SR tablet Take 10 mEq by mouth Twice daily.    [name] RANEXA 500 MG 12 hr tablet TAKE 1 TABLET BY MOUTH TWICE A DAY 60 tablet 3  . ranitidine (ZANTAC) 300 MG tablet TAKE 1 TABLET BY MOUTH TWICE A DAY 180 tablet 3  . traMADol (ULTRAM) 50 MG tablet TAKE 1 TABLET BY MOUTH WITH TYLENOL 325MG  EVERY 6 HOURS AS NEEDED FOR PAIN 90 tablet 1  . traZODone (DESYREL) 150 MG tablet TAKE 1 TABLET ONE TIME DAILY 90 tablet 2  . zinc sulfate 220 MG capsule Take 220 mg by mouth daily.     No current facility-administered medications for this visit.    BP 124/74 mmHg  Pulse 61  Ht 5' 9.5" (1.765 m)  Wt 192 lb (87.091 kg)  BMI 27.96 kg/m2 General: NAD Neck: No JVD, no thyromegaly or thyroid nodule.  Lungs: Clear to auscultation bilaterally with normal respiratory effort. CV: Nondisplaced PMI.  Heart regular S1/S2, no S3/S4, 2/6 HSM at apex.  No peripheral edema.  No carotid bruit.  Normal pedal pulses.  Abdomen: Soft, nontender, no hepatosplenomegaly, no distention.  Neurologic: Alert and oriented x 3.  Psych: Normal affect. Extremities: No clubbing or cyanosis.   Assessment/Plan: 1. CAD: Nonobstructive CAD on 12/13 cath.  I think that his chest pain symptoms may have been due to small vessel disease, which could explain the ischemic ECG response to exercise with only mild perfusion abnormality.  He has done quite well on ranolazine and is not having angina.  Continue statin, ASA 81, Toprol XL, ranolazine, and Imdur.   QTc normal on ranolazine today.  2. HTN: BP ok. 3. CKD: Last creatinine 1.89 (baseline).   4.  Hyperlipidemia: Good lipids in 8/15.  5. Murmur: History of rheumatic fever, murmur on exam.  I'll set him up for an echo.  6. Wheezing/frequent episodes of bronchitis: Mild COPD by PFTs in 2013.  I'll repeat PFTs and set him up to see pulmonary.    [name] 06/14/2014

## 2014-06-17 ENCOUNTER — Ambulatory Visit (HOSPITAL_COMMUNITY): Payer: PPO | Attending: Cardiology

## 2014-06-17 DIAGNOSIS — R0602 Shortness of breath: Secondary | ICD-10-CM

## 2014-06-17 DIAGNOSIS — I2510 Atherosclerotic heart disease of native coronary artery without angina pectoris: Secondary | ICD-10-CM | POA: Insufficient documentation

## 2014-06-17 NOTE — Progress Notes (Signed)
2D Echo completed. 06/17/2014 

## 2014-06-18 ENCOUNTER — Telehealth: Payer: Self-pay | Admitting: *Deleted

## 2014-06-18 NOTE — Telephone Encounter (Signed)
Patient informed. 

## 2014-06-18 NOTE — Telephone Encounter (Signed)
-----   Message from Larey Dresser, MD sent at 06/17/2014  8:38 PM EDT ----- EF 60-65%, no significant change

## 2014-07-13 ENCOUNTER — Encounter: Payer: Self-pay | Admitting: Pulmonary Disease

## 2014-07-13 ENCOUNTER — Ambulatory Visit (INDEPENDENT_AMBULATORY_CARE_PROVIDER_SITE_OTHER): Payer: PPO | Admitting: Pulmonary Disease

## 2014-07-13 VITALS — BP 128/70 | HR 66 | Ht 70.0 in | Wt 197.0 lb

## 2014-07-13 DIAGNOSIS — J449 Chronic obstructive pulmonary disease, unspecified: Secondary | ICD-10-CM

## 2014-07-13 DIAGNOSIS — I2510 Atherosclerotic heart disease of native coronary artery without angina pectoris: Secondary | ICD-10-CM

## 2014-07-13 DIAGNOSIS — J4530 Mild persistent asthma, uncomplicated: Secondary | ICD-10-CM | POA: Diagnosis not present

## 2014-07-13 DIAGNOSIS — R0602 Shortness of breath: Secondary | ICD-10-CM

## 2014-07-13 LAB — PULMONARY FUNCTION TEST
DL/VA % pred: 83 %
DL/VA: 3.84 ml/min/mmHg/L
DLCO unc % pred: 62 %
DLCO unc: 20.14 ml/min/mmHg
FEF 25-75 Post: 1.58 L/sec
FEF 25-75 Pre: 1.35 L/sec
FEF2575-%Change-Post: 16 %
FEF2575-%PRED-POST: 79 %
FEF2575-%Pred-Pre: 67 %
FEV1-%CHANGE-POST: 2 %
FEV1-%PRED-POST: 89 %
FEV1-%PRED-PRE: 87 %
FEV1-POST: 2.58 L
FEV1-Pre: 2.51 L
FEV1FVC-%CHANGE-POST: 4 %
FEV1FVC-%PRED-PRE: 96 %
FEV6-%Change-Post: 0 %
FEV6-%Pred-Post: 93 %
FEV6-%Pred-Pre: 94 %
FEV6-Post: 3.52 L
FEV6-Pre: 3.55 L
FEV6FVC-%Change-Post: 0 %
FEV6FVC-%PRED-PRE: 105 %
FEV6FVC-%Pred-Post: 106 %
FVC-%Change-Post: -1 %
FVC-%Pred-Post: 88 %
FVC-%Pred-Pre: 89 %
FVC-Post: 3.57 L
FVC-Pre: 3.62 L
POST FEV1/FVC RATIO: 72 %
POST FEV6/FVC RATIO: 99 %
PRE FEV6/FVC RATIO: 98 %
Pre FEV1/FVC ratio: 69 %
RV % pred: 85 %
RV: 2.26 L
TLC % PRED: 82 %
TLC: 5.81 L

## 2014-07-13 MED ORDER — AEROCHAMBER MV MISC: Status: DC

## 2014-07-13 NOTE — Patient Instructions (Signed)
We will arrange a CT scan of your chest in call you with the results Take the Symbicort with a spacer 2 puffs twice a day no matter how you feel We will see you back in 2-3 weeks or sooner if needed

## 2014-07-13 NOTE — Progress Notes (Signed)
PFT done today. 

## 2014-07-13 NOTE — Assessment & Plan Note (Signed)
He has been labeled as having COPD but he only smoked for about 8 years in the 43s and 42s. He has symptoms of an airways disease and he does have some mild airflow obstruction which partially improved with a bronchodilator today. I explained to him that this fits more with a diagnosis of chronic asthma with recurrent exacerbations. Additionally, he tells me today that he had a severe episode of pneumonia when he was 81 years old and he said "it didn't look too good". This makes me wonder if he does not have bronchiectasis. His symptoms could be explained by bronchiectasis. Additionally, when patients with chronic obstructive asthma have recurrent exacerbations it does make [place] consider the possibility of an atypical mycobacterial disease.  Plan: Start using Symbicort with a spacer to ensure adequate drug delivery High-resolution CT chest to look for evidence of bronchiectasis or an atypical mycobacterial infection Follow-up in 2-3 weeks or sooner if needed

## 2014-07-13 NOTE — Progress Notes (Signed)
Subjective:    Patient ID: Roy [name], male    DOB: [birth year], 81 y.o.   MRN: [id number]  HPI Chief Complaint  Patient presents with  . Advice Only    Referred by Dr. Aundra Dubin for sob.  Pt had pft in our office today.      This is a very pleasant 81 year old male who comes to my clinic today for evaluation of shortness of breath as well as recurrent episodes of bronchitis. He says that as an infant he had no respiratory problems but at age 57 he had a very severe episode of pneumonia which kept him housebound for several weeks. Apparently he was seen by doctors multiple times per day and given high doses of antibiotics for this. Unfortunately after that he started smoking 2-1/2 packs of cigarettes daily from age 38 through age 76. However, after that he never really had much in the way of chronic respiratory problems.  For the last several years however he has had recurrent episodes of bronchitis which brings him to his primary care doctor's office at least 2-3 times per year. He says that he will be treated with antibiotics and prednisone during this time. About 3 months ago he was put on Symbicort which she has been taking appropriately. He says that this has not improved his breathing much. He has noticed a slight worsening in terms of shortness of breath on exertion over the last year. He does cough up mucus every day which is now clear in color. When he was sick he was coughing up green to brown mucus. He denies weight loss, fevers, or chills. No chest pain or leg swelling.  Past Medical History  Diagnosis Date  . CAD (coronary artery disease)   . Hyperlipidemia   . Hypertension   . COPD (chronic obstructive pulmonary disease)     Stage 2  . Myocardial infarction   . Anginal pain   . Shortness of breath   . Lyme disease     states had shakes that were thought to be parkinson's related but related to tick bite, states also RMSF  . Sleep apnea     uses cpap  . Diabetes mellitus,  type 2   . Heart murmur   . Fever 12/26/2011  . Chronic kidney disease     renal insufficiency  . GERD (gastroesophageal reflux disease)   . Arthritis     spine  . Chest pain on exertion 02/2012  . CAP (community acquired pneumonia) 12/26/2011     Family History  Problem Relation Age of Onset  . ALS Father   . Asthma Daughter   . Cancer Sister     breast/liver     History   Social History  . Marital Status: Married    Spouse Name: N/A  . Number of Children: N/A  . Years of Education: N/A   Occupational History  . Not on file.   Social History Main Topics  . Smoking status: Former Smoker -- 1.50 packs/day for 8 years    Types: Cigarettes    Quit date: 03/05/1958  . Smokeless tobacco: Never Used  . Alcohol Use: No  . Drug Use: No  . Sexual Activity: Not Currently   Other Topics Concern  . Not on file   Social History Narrative   Married (wife patient of Dr. Yong Channel). 3 children. 4 grandchildren. 3 greatgrandchildren.       Retired from [employer]:  rabbit hunting, time with dogs     Allergies  Allergen Reactions  . Codeine Sulfate Other (See Comments)    Chest pain  . Cephalexin Rash     Outpatient Prescriptions Prior to Visit  Medication Sig Dispense Refill  . ACCU-CHEK AVIVA PLUS test strip 1 each by Other route daily.    [name] acetaminophen (TYLENOL) 325 MG tablet Take 650 mg by mouth every evening.    [name] albuterol (PROVENTIL HFA;VENTOLIN HFA) 108 (90 BASE) MCG/ACT inhaler Inhale 2 puffs into the lungs every 4 (four) hours as needed for wheezing or shortness of breath. 1 Inhaler 10  . aspirin EC 81 MG tablet Take 1 tablet (81 mg total) by mouth daily.    . ASSURE COMFORT LANCETS 30G MISC 1 each by Other route daily.    [name] atorvastatin (LIPITOR) 10 MG tablet TAKE 1 TABLET BY MOUTH ONCE A DAY 90 tablet 3  . betamethasone dipropionate (DIPROLENE) 0.05 % ointment Apply 1 application topically daily. To outside of ear. 1 week  maximum. 30 g 1  . budesonide-formoterol (SYMBICORT) 160-4.5 MCG/ACT inhaler Inhale 2 puffs into the lungs 2 (two) times daily. 1 Inhaler 12  . calcium-vitamin D (OSCAL WITH D) 500-200 MG-UNIT per tablet Take 1 tablet by mouth daily.    . Cetirizine HCl (ZYRTEC PO) Take 1 tablet by mouth daily as needed. For allergies    . glimepiride (AMARYL) 4 MG tablet TAKE 1 TABLET BY MOUTH ONCE A DAY BEFORE BREAKFAST 90 tablet 3  . isosorbide mononitrate (IMDUR) 30 MG 24 hr tablet TAKE 3 TABLETS ONE TIME DAILY 180 tablet 1  . metoprolol succinate (TOPROL-XL) 50 MG 24 hr tablet Take 1 tablet (50 mg total) by mouth daily. Take with or immediately following a meal. 90 tablet 1  . nitroGLYCERIN (NITROSTAT) 0.4 MG SL tablet Place 1 tablet (0.4 mg total) under the tongue every 5 (five) minutes as needed. For chest pain 25 tablet 6  . Omega-3 Fatty Acids (FISH OIL PO) Take 2 capsules by mouth 2 (two) times daily.    [name] (SYSTANE OP) Place 1 drop into both eyes 4 (four) times daily. Itchy/dry eye    . potassium citrate (UROCIT-K) 10 MEQ (1080 MG) SR tablet Take 10 mEq by mouth Twice daily.    [name] RANEXA 500 MG 12 hr tablet TAKE 1 TABLET BY MOUTH TWICE A DAY 60 tablet 3  . ranitidine (ZANTAC) 300 MG tablet TAKE 1 TABLET BY MOUTH TWICE A DAY 180 tablet 3  . traMADol (ULTRAM) 50 MG tablet TAKE 1 TABLET BY MOUTH WITH TYLENOL 325MG  EVERY 6 HOURS AS NEEDED FOR PAIN 90 tablet 1  . traZODone (DESYREL) 150 MG tablet TAKE 1 TABLET ONE TIME DAILY 90 tablet 2  . zinc sulfate 220 MG capsule Take 220 mg by mouth daily.     No facility-administered medications prior to visit.       Review of Systems  Constitutional: Negative for fever and unexpected weight change.  HENT: Negative for congestion, dental problem, ear pain, nosebleeds, postnasal drip, rhinorrhea, sinus pressure, sneezing, sore throat and trouble swallowing.   Eyes: Negative for redness and itching.  Respiratory: Positive for  shortness of breath. Negative for cough, chest tightness and wheezing.   Cardiovascular: Negative for palpitations and leg swelling.  Gastrointestinal: Negative for nausea and vomiting.  Genitourinary: Negative for dysuria.  Musculoskeletal: Negative for joint swelling.  Skin: Negative for rash.  Neurological: Negative for headaches.  Hematological: Does not bruise/bleed  easily.  Psychiatric/Behavioral: Negative for dysphoric mood. The patient is not nervous/anxious.        Objective:   Physical Exam Filed Vitals:   07/13/14 1030  BP: 128/70  Pulse: 66  Height: 5\' 10"  (1.778 m)  Weight: 197 lb (89.359 kg)  SpO2: 99%   Gen: well appearing, no acute distress HENT: NCAT, OP clear, neck supple without masses Eyes: PERRL, EOMi Lymph: no cervical lymphadenopathy PULM: CTA B CV: RRR, no mgr, no JVD GI: BS+, soft, nontender, no hsm Derm: no rash or skin breakdown MSK: normal bulk and tone Neuro: A&Ox4, CN II-XII intact, strength 5/5 in all 4 extremities Psyche: normal mood and affect  2015 CXR images personally reviewed. > mild hyperinflation and some scarring vs atelectasis in the R base Cardiology visit with Dr. Aundra Dubin reviewed> stable cardiac symptoms., echo with normal LV size and function, mildly elevated RVSP, referred to [place] for further evaluation     Assessment & Plan:  Mild persistent asthma He has been labeled as having COPD but he only smoked for about 8 years in the 1950s and 1960s. He has symptoms of an airways disease and he does have some mild airflow obstruction which partially improved with a bronchodilator today. I explained to him that this fits more with a diagnosis of chronic asthma with recurrent exacerbations. Additionally, he tells me today that he had a severe episode of pneumonia when he was 81 years old and he said "it didn't look too good". This makes me wonder if he does not have bronchiectasis. His symptoms could be explained by bronchiectasis.  Additionally, when patients with chronic obstructive asthma have recurrent exacerbations it does make [place] consider the possibility of an atypical mycobacterial disease.  Plan: Start using Symbicort with a spacer to ensure adequate drug delivery High-resolution CT chest to look for evidence of bronchiectasis or an atypical mycobacterial infection Follow-up in 2-3 weeks or sooner if needed

## 2014-07-16 ENCOUNTER — Ambulatory Visit (INDEPENDENT_AMBULATORY_CARE_PROVIDER_SITE_OTHER)
Admission: RE | Admit: 2014-07-16 | Discharge: 2014-07-16 | Disposition: A | Discharge status: Home / Self Care | Payer: PPO | Source: Ambulatory Visit | Attending: Pulmonary Disease | Admitting: Pulmonary Disease

## 2014-07-16 DIAGNOSIS — J449 Chronic obstructive pulmonary disease, unspecified: Secondary | ICD-10-CM | POA: Diagnosis not present

## 2014-07-19 ENCOUNTER — Telehealth: Payer: Self-pay

## 2014-07-19 NOTE — Telephone Encounter (Signed)
-----   Message from Juanito Doom, MD sent at 07/19/2014  2:51 PM EDT ----- A, Please let him know that his CT was OK, no abnormalities. Thanks B

## 2014-07-19 NOTE — Telephone Encounter (Signed)
Pt aware of results.  Nothing further needed.  

## 2014-07-27 ENCOUNTER — Ambulatory Visit (INDEPENDENT_AMBULATORY_CARE_PROVIDER_SITE_OTHER): Payer: PPO | Admitting: Adult Health

## 2014-07-27 ENCOUNTER — Encounter: Payer: Self-pay | Admitting: Adult Health

## 2014-07-27 VITALS — BP 116/68 | HR 60 | Temp 98.3°F | Ht 70.0 in | Wt 196.8 lb

## 2014-07-27 DIAGNOSIS — Z23 Encounter for immunization: Secondary | ICD-10-CM | POA: Diagnosis not present

## 2014-07-27 DIAGNOSIS — J4530 Mild persistent asthma, uncomplicated: Secondary | ICD-10-CM

## 2014-07-27 NOTE — Addendum Note (Signed)
Addended by: Parke Poisson E on: 07/27/2014 03:01 PM   Modules accepted: Orders

## 2014-07-27 NOTE — Patient Instructions (Signed)
Continue on Symbicort 2 puffs Twice daily  , rinse after use .  Prevnar vaccine today  Follow up Dr. Lake Bells in 3 months and As needed

## 2014-07-27 NOTE — Progress Notes (Signed)
   Subjective:    Patient ID: Roy [name], male    DOB: [birth year], 81 y.o.   MRN: [id number]  HPI 81 yo with minimal smoking hx seen for initial pulmonary consult 07/13/14 for recurrent bronchitis   07/27/2014 Follow up : Asthma  Pt returns for 2 week follow up .  Seem last ov for evaluation for recurrent bronchitis .  Spirometry showed mild airflow obstruction w/ partial improvement with BD.  He was started on Symbicort.  HRCT showed no sign of bronchiectasis or ILD . Only mild biapical pleural scarring .  He says he is feeling better.  Has less dyspnea.  Denies any increased cough, wheezing, tightness, purulent sputum      Review of Systems  Constitutional: Negative for fever and unexpected weight change.  HENT: Negative for congestion, dental problem, ear pain, nosebleeds, postnasal drip, rhinorrhea, sinus pressure, sneezing, sore throat and trouble swallowing.   Eyes: Negative for redness and itching.  Respiratory:   Negative for cough, chest tightness and wheezing.   Cardiovascular: Negative for palpitations and leg swelling.  Gastrointestinal: Negative for nausea and vomiting.  Genitourinary: Negative for dysuria.  Musculoskeletal: Negative for joint swelling.  Skin: Negative for rash.  Neurological: Negative for headaches.  Hematological: Does not bruise/bleed easily.  Psychiatric/Behavioral: Negative for dysphoric mood. The patient is not nervous/anxious.        Objective:   Physical Exam GEN: A/Ox3; pleasant , NAD, elderly   HEENT:  [place]/AT,  EACs-clear, TMs-wnl, NOSE-clear, THROAT-clear, no lesions, no postnasal drip or exudate noted.   NECK:  Supple w/ fair ROM; no JVD; normal carotid impulses w/o bruits; no thyromegaly or nodules palpated; no lymphadenopathy.  RESP  Clear  P & A; w/o, wheezes/ rales/ or rhonchi.no accessory muscle use, no dullness to percussion  CARD:  RRR, no m/r/g  , no peripheral edema, pulses intact, no cyanosis or clubbing.  GI:   Soft  & nt; nml bowel sounds; no organomegaly or masses detected.  Musco: Warm bil, no deformities or joint swelling noted.   Neuro: alert, no focal deficits noted.    Skin: Warm, no lesions or rashe  07/19/14 CT chest  No ILD or bronchiectasis.

## 2014-07-27 NOTE — Assessment & Plan Note (Signed)
Improved control with Symbicort  CT showed no ILD or Bronchietasis   Plan  Continue on Symbicort 2 puffs Twice daily  , rinse after use .  Prevnar vaccine today  Follow up Dr. Lake Bells in 3 months and As needed

## 2014-07-28 NOTE — Progress Notes (Signed)
I agree with this plan of care 

## 2014-08-10 ENCOUNTER — Telehealth: Payer: Self-pay | Admitting: Family Medicine

## 2014-08-10 MED ORDER — RANITIDINE HCL 300 MG PO TABS: ORAL_TABLET | ORAL | Status: DC

## 2014-08-10 NOTE — Telephone Encounter (Signed)
Medication refilled

## 2014-08-10 NOTE — Telephone Encounter (Signed)
Pt request refill of the following: ranitidine (ZANTAC) 300 MG tablet   Phamacy:  [facility]

## 2014-08-27 ENCOUNTER — Other Ambulatory Visit: Payer: Self-pay | Admitting: Family Medicine

## 2014-08-31 ENCOUNTER — Other Ambulatory Visit: Payer: Self-pay

## 2014-08-31 ENCOUNTER — Telehealth: Payer: Self-pay | Admitting: Family Medicine

## 2014-08-31 MED ORDER — RANOLAZINE ER 500 MG PO TB12: 500.0000 mg | ORAL_TABLET | Freq: Two times a day (BID) | ORAL | Status: DC

## 2014-08-31 MED ORDER — BUDESONIDE-FORMOTEROL FUMARATE 160-4.5 MCG/ACT IN AERO: 2.0000 | INHALATION_SPRAY | Freq: Two times a day (BID) | RESPIRATORY_TRACT | Status: DC

## 2014-08-31 NOTE — Telephone Encounter (Signed)
Medication refilled

## 2014-08-31 NOTE — Telephone Encounter (Signed)
Pt would like refill of budesonide-formoterol (SYMBICORT) 160-4.5 MCG/ACT inhaler But please sen to new pharm: [facility]/ [address] [facility]

## 2014-10-18 ENCOUNTER — Other Ambulatory Visit (INDEPENDENT_AMBULATORY_CARE_PROVIDER_SITE_OTHER): Payer: PPO | Admitting: *Deleted

## 2014-10-18 DIAGNOSIS — R0602 Shortness of breath: Secondary | ICD-10-CM

## 2014-10-18 DIAGNOSIS — I2510 Atherosclerotic heart disease of native coronary artery without angina pectoris: Secondary | ICD-10-CM

## 2014-10-18 LAB — LIPID PANEL
Cholesterol: 118 mg/dL (ref 0–200)
HDL: 35.7 mg/dL — ABNORMAL LOW (ref >39.00)
LDL Cholesterol: 54 mg/dL (ref 0–99)
NonHDL: 82.63
TRIGLYCERIDES: 142 mg/dL (ref 0.0–149.0)
Total CHOL/HDL Ratio: 3
VLDL: 28.4 mg/dL (ref 0.0–40.0)

## 2014-10-25 ENCOUNTER — Encounter: Payer: Self-pay | Admitting: Pulmonary Disease

## 2014-10-25 ENCOUNTER — Ambulatory Visit (INDEPENDENT_AMBULATORY_CARE_PROVIDER_SITE_OTHER): Payer: PPO | Admitting: Pulmonary Disease

## 2014-10-25 VITALS — BP 126/72 | HR 60 | Ht 70.0 in | Wt 196.0 lb

## 2014-10-25 DIAGNOSIS — J4530 Mild persistent asthma, uncomplicated: Secondary | ICD-10-CM | POA: Diagnosis not present

## 2014-10-25 NOTE — Assessment & Plan Note (Signed)
This has been a stable interval for Roy [name]. He has mild persistent asthma with airflow obstruction. I do not believe he has COPD as his smoking history was minimal. He has been doing well with Symbicort. His immunizations are up-to-date. He is currently asymptomatic.  Plan: Continue Symbicort with a spacer twice a day Consider decreasing the dose to 80/4.5 on the next visit Follow-up one year or sooner if needed Get a flu shot in the fall

## 2014-10-25 NOTE — Progress Notes (Signed)
Subjective:    Patient ID: Roy [name], male    DOB: 01-Sep-1932, 81 y.o.   MRN: [id number]  Synopsis Chronic obstructive asthma, minimal smoking history.  Only smoked about 10 years in the 1950's. 07/13/2014 pulmonary function testing> ratio 69%, FEV1 2.51 L (87% predicted, obstruction resolved after bronchodilator and FEV1 increased to 2.58 L, 2% change with bronchodilator Total lung capacity 5.81 L (82% predicted) DLCO 20.14 (62% predicted). 07/2014 CT chest ([place]) > no ILD or bronchiectasis  HPI Chief Complaint  Patient presents with  . Follow-up    pt c/o sob in a.m. but does well after a.m. neb tx.  no other complaints.   CAT score 3.    Mr. Roy [name] has been doing well recently.  He has been caring for his wife as she had syncope a few weeks back and had a car accident.  He has been doing well.  He turns 82 next month.  He continues to take the symbicort twice a day with a spacer.  No doctors visits. He says that he is no longer having any trouble with shortness of breath. He sometimes has some fatigue in the morning, but the symbicort usually helps with this.   Past Medical History  Diagnosis Date  . CAD (coronary artery disease)   . Hyperlipidemia   . Hypertension   . Myocardial infarction   . Anginal pain   . Shortness of breath   . Lyme disease     states had shakes that were thought to be parkinson's related but related to tick bite, states also RMSF  . Sleep apnea     uses cpap  . Diabetes mellitus, type 2   . Heart murmur   . Fever 12/26/2011  . Chronic kidney disease     renal insufficiency  . GERD (gastroesophageal reflux disease)   . Arthritis     spine  . Chest pain on exertion 02/2012  . CAP (community acquired pneumonia) 12/26/2011  . Asthma       Review of Systems     Objective:   Physical Exam  Filed Vitals:   10/25/14 1616  BP: 126/72  Pulse: 60  Height: 5\' 10"  (1.778 m)  Weight: 196 lb (88.905 kg)  SpO2: 99%   RA  Gen: well  appearing HENT: OP clear, TM's clear, neck supple PULM: CTA B, normal percussion CV: RRR, no mgr, trace edema GI: BS+, soft, nontender Derm: no cyanosis or rash Psyche: normal mood and affect       Assessment & Plan:  Mild persistent asthma This has been a stable interval for Pheng. He has mild persistent asthma with airflow obstruction. I do not believe he has COPD as his smoking history was minimal. He has been doing well with Symbicort. His immunizations are up-to-date. He is currently asymptomatic.  Plan: Continue Symbicort with a spacer twice a day Consider decreasing the dose to 80/4.5 on the next visit Follow-up one year or sooner if needed Get a flu shot in the fall     Current outpatient prescriptions:  .  ACCU-CHEK AVIVA PLUS test strip, 1 each by Other route daily., Disp: , Rfl:  .  acetaminophen (TYLENOL) 325 MG tablet, Take 650 mg by mouth every evening., Disp: , Rfl:  .  albuterol (PROVENTIL HFA;VENTOLIN HFA) 108 (90 BASE) MCG/ACT inhaler, Inhale 2 puffs into the lungs every 4 (four) hours as needed for wheezing or shortness of breath., Disp: 1 Inhaler, Rfl: 10 .  aspirin EC  81 MG tablet, Take 1 tablet (81 mg total) by mouth daily., Disp: , Rfl:  .  ASSURE COMFORT LANCETS 30G MISC, 1 each by Other route daily., Disp: , Rfl:  .  atorvastatin (LIPITOR) 10 MG tablet, TAKE 1 TABLET BY MOUTH ONCE A DAY, Disp: 90 tablet, Rfl: 3 .  betamethasone dipropionate (DIPROLENE) 0.05 % ointment, Apply 1 application topically daily. To outside of ear. 1 week maximum., Disp: 30 g, Rfl: 1 .  budesonide-formoterol (SYMBICORT) 160-4.5 MCG/ACT inhaler, Inhale 2 puffs into the lungs 2 (two) times daily., Disp: 1 Inhaler, Rfl: 12 .  calcium-vitamin D (OSCAL WITH D) 500-200 MG-UNIT per tablet, Take 1 tablet by mouth daily., Disp: , Rfl:  .  glimepiride (AMARYL) 4 MG tablet, TAKE 1 TABLET BY MOUTH ONCE A DAY BEFORE BREAKFAST, Disp: 90 tablet, Rfl: 3 .  isosorbide mononitrate (IMDUR) 30 MG 24  hr tablet, TAKE THREE TABLETS BY MOUTH ONCE DAILY, Disp: 180 tablet, Rfl: 3 .  metoprolol succinate (TOPROL-XL) 50 MG 24 hr tablet, Take 1 tablet (50 mg total) by mouth daily. Take with or immediately following a meal., Disp: 90 tablet, Rfl: 1 .  nitroGLYCERIN (NITROSTAT) 0.4 MG SL tablet, Place 1 tablet (0.4 mg total) under the tongue every 5 (five) minutes as needed. For chest pain, Disp: 25 tablet, Rfl: 6 .  Omega-3 Fatty Acids (FISH OIL PO), Take 2 capsules by mouth 2 (two) times daily., Disp: , Rfl:  .  Polyethyl Glycol-Propyl Glycol (SYSTANE OP), Place 1 drop into both eyes 4 (four) times daily. Itchy/dry eye, Disp: , Rfl:  .  potassium citrate (UROCIT-K) 10 MEQ (1080 MG) SR tablet, Take 10 mEq by mouth Twice daily., Disp: , Rfl:  .  ranitidine (ZANTAC) 300 MG tablet, TAKE 1 TABLET BY MOUTH TWICE A DAY, Disp: 180 tablet, Rfl: 3 .  ranolazine (RANEXA) 500 MG 12 hr tablet, Take 1 tablet (500 mg total) by mouth 2 (two) times daily., Disp: 60 tablet, Rfl: 3 .  Spacer/Aero-Holding Chambers (AEROCHAMBER MV) inhaler, Use as instructed, Disp: 1 each, Rfl: 0 .  traMADol (ULTRAM) 50 MG tablet, TAKE 1 TABLET BY MOUTH WITH TYLENOL 325MG  EVERY 6 HOURS AS NEEDED FOR PAIN, Disp: 90 tablet, Rfl: 1 .  traZODone (DESYREL) 150 MG tablet, TAKE 1 TABLET ONE TIME DAILY, Disp: 90 tablet, Rfl: 2 .  zinc sulfate 220 MG capsule, Take 220 mg by mouth daily., Disp: , Rfl:

## 2014-10-25 NOTE — Patient Instructions (Signed)
Keep taking your inhaler as you're doing Get a flu shot in the fall Stay active We'll see you back in a year

## 2014-11-04 ENCOUNTER — Telehealth: Payer: Self-pay | Admitting: Family Medicine

## 2014-11-04 NOTE — Telephone Encounter (Signed)
Spoke with Pt and he does not need any supplies at this moment, thnaks Mrs. Juliann Pulse.

## 2014-11-04 NOTE — Telephone Encounter (Addendum)
[facility] calling to [facility] on request for pt's diabetic supplies. [facility] we would need to confirm w/ pt he would like to get his rx from them.. They are going to refax

## 2014-11-14 ENCOUNTER — Other Ambulatory Visit: Payer: Self-pay | Admitting: Family Medicine

## 2014-12-24 ENCOUNTER — Other Ambulatory Visit: Payer: Self-pay | Admitting: Family Medicine

## 2014-12-24 ENCOUNTER — Other Ambulatory Visit: Payer: Self-pay | Admitting: Cardiology

## 2014-12-27 ENCOUNTER — Telehealth: Payer: Self-pay | Admitting: Cardiology

## 2014-12-27 NOTE — Telephone Encounter (Signed)
°  STAT if patient is at the pharmacy , call can be transferred to refill team.   1. Which medications need to be refilled? [place]   2. Which pharmacy/location is medication to be sent to? [facility] on [address] in [place]  3. Do they need a 30 day or 90 day supply? 90 day supply

## 2015-01-29 ENCOUNTER — Other Ambulatory Visit: Payer: Self-pay | Admitting: Family Medicine

## 2015-02-07 LAB — HM DIABETES EYE EXAM

## 2015-02-08 ENCOUNTER — Encounter (HOSPITAL_COMMUNITY): Payer: Self-pay | Admitting: Emergency Medicine

## 2015-02-08 ENCOUNTER — Emergency Department (HOSPITAL_COMMUNITY): Payer: PPO

## 2015-02-08 ENCOUNTER — Emergency Department (INDEPENDENT_AMBULATORY_CARE_PROVIDER_SITE_OTHER)
Admission: EM | Admit: 2015-02-08 | Discharge: 2015-02-08 | Disposition: A | Discharge status: Nursing Home (Includes ICF) | Payer: PPO | Source: Home / Self Care

## 2015-02-08 ENCOUNTER — Emergency Department (HOSPITAL_COMMUNITY)
Admission: EM | Admit: 2015-02-08 | Discharge: 2015-02-09 | Disposition: A | Discharge status: Home / Self Care | Payer: PPO | Attending: Emergency Medicine | Admitting: Emergency Medicine

## 2015-02-08 DIAGNOSIS — Z7982 Long term (current) use of aspirin: Secondary | ICD-10-CM | POA: Insufficient documentation

## 2015-02-08 DIAGNOSIS — Z8619 Personal history of other infectious and parasitic diseases: Secondary | ICD-10-CM | POA: Diagnosis not present

## 2015-02-08 DIAGNOSIS — E785 Hyperlipidemia, unspecified: Secondary | ICD-10-CM | POA: Diagnosis not present

## 2015-02-08 DIAGNOSIS — I25119 Atherosclerotic heart disease of native coronary artery with unspecified angina pectoris: Secondary | ICD-10-CM | POA: Diagnosis not present

## 2015-02-08 DIAGNOSIS — K219 Gastro-esophageal reflux disease without esophagitis: Secondary | ICD-10-CM | POA: Insufficient documentation

## 2015-02-08 DIAGNOSIS — M1990 Unspecified osteoarthritis, unspecified site: Secondary | ICD-10-CM | POA: Insufficient documentation

## 2015-02-08 DIAGNOSIS — Z8701 Personal history of pneumonia (recurrent): Secondary | ICD-10-CM | POA: Insufficient documentation

## 2015-02-08 DIAGNOSIS — Z9889 Other specified postprocedural states: Secondary | ICD-10-CM | POA: Diagnosis not present

## 2015-02-08 DIAGNOSIS — J45909 Unspecified asthma, uncomplicated: Secondary | ICD-10-CM | POA: Insufficient documentation

## 2015-02-08 DIAGNOSIS — R109 Unspecified abdominal pain: Secondary | ICD-10-CM | POA: Insufficient documentation

## 2015-02-08 DIAGNOSIS — R1111 Vomiting without nausea: Secondary | ICD-10-CM | POA: Diagnosis not present

## 2015-02-08 DIAGNOSIS — Z791 Long term (current) use of non-steroidal anti-inflammatories (NSAID): Secondary | ICD-10-CM | POA: Insufficient documentation

## 2015-02-08 DIAGNOSIS — E119 Type 2 diabetes mellitus without complications: Secondary | ICD-10-CM | POA: Diagnosis not present

## 2015-02-08 DIAGNOSIS — I252 Old myocardial infarction: Secondary | ICD-10-CM | POA: Insufficient documentation

## 2015-02-08 DIAGNOSIS — R1084 Generalized abdominal pain: Secondary | ICD-10-CM | POA: Diagnosis not present

## 2015-02-08 DIAGNOSIS — Z87891 Personal history of nicotine dependence: Secondary | ICD-10-CM | POA: Diagnosis not present

## 2015-02-08 DIAGNOSIS — Z7951 Long term (current) use of inhaled steroids: Secondary | ICD-10-CM | POA: Diagnosis not present

## 2015-02-08 DIAGNOSIS — I129 Hypertensive chronic kidney disease with stage 1 through stage 4 chronic kidney disease, or unspecified chronic kidney disease: Secondary | ICD-10-CM | POA: Insufficient documentation

## 2015-02-08 DIAGNOSIS — N289 Disorder of kidney and ureter, unspecified: Secondary | ICD-10-CM

## 2015-02-08 DIAGNOSIS — G4730 Sleep apnea, unspecified: Secondary | ICD-10-CM | POA: Diagnosis not present

## 2015-02-08 DIAGNOSIS — N189 Chronic kidney disease, unspecified: Secondary | ICD-10-CM | POA: Insufficient documentation

## 2015-02-08 DIAGNOSIS — R112 Nausea with vomiting, unspecified: Secondary | ICD-10-CM

## 2015-02-08 LAB — URINE MICROSCOPIC-ADD ON

## 2015-02-08 LAB — CBC
HCT: 43.4 % (ref 39.0–52.0)
Hemoglobin: 14.7 g/dL (ref 13.0–17.0)
MCH: 29 pg (ref 26.0–34.0)
MCHC: 33.9 g/dL (ref 30.0–36.0)
MCV: 85.6 fL (ref 78.0–100.0)
PLATELETS: 127 10*3/uL — AB (ref 150–400)
RBC: 5.07 MIL/uL (ref 4.22–5.81)
RDW: 13.7 % (ref 11.5–15.5)
WBC: 11.6 10*3/uL — AB (ref 4.0–10.5)

## 2015-02-08 LAB — COMPREHENSIVE METABOLIC PANEL
ALBUMIN: 4.3 g/dL (ref 3.5–5.0)
ALT: 20 U/L (ref 17–63)
ANION GAP: 14 (ref 5–15)
AST: 18 U/L (ref 15–41)
Alkaline Phosphatase: 65 U/L (ref 38–126)
BUN: 19 mg/dL (ref 6–20)
CALCIUM: 10.4 mg/dL — AB (ref 8.9–10.3)
CO2: 27 mmol/L (ref 22–32)
CREATININE: 1.73 mg/dL — AB (ref 0.61–1.24)
Chloride: 98 mmol/L — ABNORMAL LOW (ref 101–111)
GFR calc non Af Amer: 36 mL/min — ABNORMAL LOW (ref >60)
GFR, EST AFRICAN AMERICAN: 41 mL/min — AB (ref >60)
Glucose, Bld: 199 mg/dL — ABNORMAL HIGH (ref 65–99)
Potassium: 3.8 mmol/L (ref 3.5–5.1)
SODIUM: 139 mmol/L (ref 135–145)
TOTAL PROTEIN: 7.6 g/dL (ref 6.5–8.1)
Total Bilirubin: 1.2 mg/dL (ref 0.3–1.2)

## 2015-02-08 LAB — URINALYSIS, ROUTINE W REFLEX MICROSCOPIC
Bilirubin Urine: NEGATIVE
Glucose, UA: 100 mg/dL — AB
Hgb urine dipstick: NEGATIVE
KETONES UR: 15 mg/dL — AB
Leukocytes, UA: NEGATIVE
Nitrite: NEGATIVE
PROTEIN: 100 mg/dL — AB
Specific Gravity, Urine: 1.021 (ref 1.005–1.030)
pH: 8.5 — ABNORMAL HIGH (ref 5.0–8.0)

## 2015-02-08 LAB — I-STAT CG4 LACTIC ACID, ED: Lactic Acid, Venous: 2.05 mmol/L (ref 0.5–2.0)

## 2015-02-08 LAB — LIPASE, BLOOD: LIPASE: 23 U/L (ref 11–51)

## 2015-02-08 MED ORDER — ONDANSETRON 4 MG PO TBDP
4.0000 mg | ORAL_TABLET | Freq: Once | ORAL | Status: AC
  Administered 2015-02-08: 4 mg via ORAL

## 2015-02-08 MED ORDER — HYDROMORPHONE HCL 1 MG/ML IJ SOLN
1.0000 mg | Freq: Once | INTRAMUSCULAR | Status: AC
  Administered 2015-02-08: 1 mg via INTRAVENOUS
  Filled 2015-02-08: qty 1

## 2015-02-08 MED ORDER — ONDANSETRON HCL 4 MG/2ML IJ SOLN
4.0000 mg | Freq: Once | INTRAMUSCULAR | Status: AC
  Administered 2015-02-08: 4 mg via INTRAVENOUS
  Filled 2015-02-08: qty 2

## 2015-02-08 MED ORDER — ONDANSETRON 4 MG PO TBDP
ORAL_TABLET | ORAL | Status: AC
  Filled 2015-02-08: qty 1

## 2015-02-08 MED ORDER — SODIUM CHLORIDE 0.9 % IV SOLN
1000.0000 mL | INTRAVENOUS | Status: DC
  Administered 2015-02-09: 1000 mL via INTRAVENOUS

## 2015-02-08 MED ORDER — SODIUM CHLORIDE 0.9 % IV SOLN
1000.0000 mL | Freq: Once | INTRAVENOUS | Status: AC
  Administered 2015-02-08: 1000 mL via INTRAVENOUS

## 2015-02-08 NOTE — ED Provider Notes (Signed)
CSN: [id number]     Arrival date & time 02/08/15  2024 History   First MD Initiated Contact with Patient 02/08/15 2221     Chief Complaint  Patient presents with  . Abdominal Pain     (Consider location/radiation/quality/duration/timing/severity/associated sxs/prior Treatment) Patient is a 81 y.o. male presenting with abdominal pain. The history is provided by the patient.  Abdominal Pain He had onset this afternoon of crampy midabdominal pain with occasional radiation to the back. Pain is severe and he rates it at 10/10. There is associated nausea and vomiting. There is temporary relief of pain with vomiting. He denies constipation or diarrhea. He denies fever, chills, sweats. He was seen at urgent care and was transferred here with suspicion for possible bowel obstruction. He does have history of multiple abdominal surgeries including 2 surgeries for appendicitis and also cholecystectomy.  Past Medical History  Diagnosis Date  . CAD (coronary artery disease)   . Hyperlipidemia   . Hypertension   . Myocardial infarction ([place])   . Anginal pain ([place])   . Shortness of breath   . Lyme disease     states had shakes that were thought to be parkinson's related but related to tick bite, states also RMSF  . Sleep apnea     uses cpap  . Diabetes mellitus, type 2 ([place])   . Heart murmur   . Fever 12/26/2011  . Chronic kidney disease     renal insufficiency  . GERD (gastroesophageal reflux disease)   . Arthritis     spine  . Chest pain on exertion 02/2012  . CAP (community acquired pneumonia) 12/26/2011  . Asthma    Past Surgical History  Procedure Laterality Date  . Cervical laminectomy    . Cholecystectomy    . Appendectomy    . Kidney stone surgery    . Left heart cath N/A 02/15/2012    Procedure: LEFT HEART CATH;  Surgeon: Larey Dresser, MD;  Location: [facility] CATH LAB;  Service: Cardiovascular;  Laterality: N/A;   Family History  Problem Relation Age of Onset  . ALS Father   .  Asthma Daughter   . Cancer Sister     breast/liver   Social History  Substance Use Topics  . Smoking status: Former Smoker -- 1.50 packs/day for 8 years    Types: Cigarettes    Quit date: 03/05/1958  . Smokeless tobacco: Never Used  . Alcohol Use: No    Review of Systems  Gastrointestinal: Positive for abdominal pain.  All other systems reviewed and are negative.     Allergies  Codeine sulfate and Cephalexin  Home Medications   Prior to Admission medications   Medication Sig Start Date End Date Taking? Authorizing Provider  ACCU-CHEK AVIVA PLUS test strip 1 each by Other route daily. 12/02/13   Historical Provider, MD  acetaminophen (TYLENOL) 325 MG tablet Take 650 mg by mouth every evening.    Historical Provider, MD  albuterol (PROVENTIL HFA;VENTOLIN HFA) 108 (90 BASE) MCG/ACT inhaler Inhale 2 puffs into the lungs every 4 (four) hours as needed for wheezing or shortness of breath. 10/21/13   Zenaida Niece, PA-C  aspirin EC 81 MG tablet Take 1 tablet (81 mg total) by mouth daily. 03/11/12   Larey Dresser, MD  ASSURE COMFORT LANCETS 30G MISC 1 each by Other route daily. 12/02/13   Historical Provider, MD  atorvastatin (LIPITOR) 10 MG tablet TAKE 1 TABLET BY MOUTH ONCE A DAY 04/21/14   Marin Olp,  MD  betamethasone dipropionate (DIPROLENE) 0.05 % ointment Apply 1 application topically daily. To outside of ear. 1 week maximum. 04/21/14   Marin Olp, MD  budesonide-formoterol [facility]) 160-4.5 MCG/ACT inhaler Inhale 2 puffs into the lungs 2 (two) times daily. 08/31/14   Marin Olp, MD  calcium-vitamin D (OSCAL WITH D) 500-200 MG-UNIT per tablet Take 1 tablet by mouth daily.    Historical Provider, MD  glimepiride (AMARYL) 4 MG tablet TAKE 1 TABLET BY MOUTH ONCE A DAY BEFORE BREAKFAST 04/21/14   Marin Olp, MD  isosorbide mononitrate (IMDUR) 30 MG 24 hr tablet TAKE THREE TABLETS BY MOUTH ONCE DAILY 08/27/14   Marin Olp, MD  metoprolol succinate  (TOPROL-XL) 50 MG 24 hr tablet TAKE ONE TABLET BY MOUTH ONCE DAILY. TAKE WITH OR IMMEDIATELY FOLLOWING A MEAL 12/25/14   Marin Olp, MD  nitroGLYCERIN (NITROSTAT) 0.4 MG SL tablet Place 1 tablet (0.4 mg total) under the tongue every 5 (five) minutes as needed. For chest pain 06/14/14   Larey Dresser, MD  Omega-3 Fatty Acids (FISH OIL PO) Take 2 capsules by mouth 2 (two) times daily.    Historical Provider, MD  Polyethyl Glycol-Propyl Glycol (SYSTANE OP) Place 1 drop into both eyes 4 (four) times daily. Itchy/dry eye    Historical Provider, MD  potassium citrate (UROCIT-K) 10 MEQ (1080 MG) SR tablet Take 10 mEq by mouth Twice daily. 01/29/12   Historical Provider, MD  RANEXA 500 MG 12 hr tablet TAKE ONE TABLET BY MOUTH TWICE DAILY 12/27/14   Larey Dresser, MD  ranitidine (ZANTAC) 300 MG tablet TAKE 1 TABLET BY MOUTH TWICE A DAY 08/10/14   Marin Olp, MD  Spacer/Aero-Holding Chambers (AEROCHAMBER MV) inhaler Use as instructed 07/13/14   Juanito Doom, MD  traMADol (ULTRAM) 50 MG tablet TAKE ONE TABLET BY MOUTH WITH TYLENOL 325MG   EVERY 6 HOURS AS NEEDED FOR PAIN 11/15/14   Marin Olp, MD  traZODone (DESYREL) 150 MG tablet TAKE ONE TABLET BY MOUTH ONCE DAILY 01/31/15   Marin Olp, MD  zinc sulfate 220 MG capsule Take 220 mg by mouth daily.    Historical Provider, MD   BP 144/74 mmHg  Pulse 74  Temp(Src) 99.3 F (37.4 C) (Oral)  Resp 20  SpO2 100% Physical Exam  Nursing note and vitals reviewed.   81 year old male, resting comfortably and in no acute distress. Vital signs are significant for mild hypertension. Oxygen saturation is 100%, which is normal. Head is normocephalic and atraumatic. PERRLA, EOMI. Oropharynx is clear. Neck is nontender and supple without adenopathy or JVD. Back is nontender and there is no CVA tenderness. Lungs are clear without rales, wheezes, or rhonchi. Chest is nontender. Heart has regular rate and rhythm without murmur. Abdomen is soft,  flat, with mild periumbilical tenderness. There are no masses or hepatosplenomegaly and peristalsis is hypoactive. Extremities have trace edema, full range of motion is present. Skin is warm and dry without rash. Neurologic: Mental status is normal, cranial nerves are intact, there are no motor or sensory deficits.  ED Course  Procedures (including critical care time) Labs Review Results for orders placed or performed during the hospital encounter of 02/08/15  Lipase, blood  Result Value Ref Range   Lipase 23 11 - 51 U/L  Comprehensive metabolic panel  Result Value Ref Range   Sodium 139 135 - 145 mmol/L   Potassium 3.8 3.5 - 5.1 mmol/L   Chloride 98 (L) 101 -  111 mmol/L   CO2 27 22 - 32 mmol/L   Glucose, Bld 199 (H) 65 - 99 mg/dL   BUN 19 6 - 20 mg/dL   Creatinine, Ser 1.73 (H) 0.61 - 1.24 mg/dL   Calcium 10.4 (H) 8.9 - 10.3 mg/dL   Total Protein 7.6 6.5 - 8.1 g/dL   Albumin 4.3 3.5 - 5.0 g/dL   AST 18 15 - 41 U/L   ALT 20 17 - 63 U/L   Alkaline Phosphatase 65 38 - 126 U/L   Total Bilirubin 1.2 0.3 - 1.2 mg/dL   GFR calc non Af Amer 36 (L) >60 mL/min   GFR calc Af Amer 41 (L) >60 mL/min   Anion gap 14 5 - 15  CBC  Result Value Ref Range   WBC 11.6 (H) 4.0 - 10.5 K/uL   RBC 5.07 4.22 - 5.81 MIL/uL   Hemoglobin 14.7 13.0 - 17.0 g/dL   HCT 43.4 39.0 - 52.0 %   MCV 85.6 78.0 - 100.0 fL   MCH 29.0 26.0 - 34.0 pg   MCHC 33.9 30.0 - 36.0 g/dL   RDW 13.7 11.5 - 15.5 %   Platelets 127 (L) 150 - 400 K/uL  Urinalysis, Routine w reflex microscopic (not at [facility])  Result Value Ref Range   Color, Urine YELLOW YELLOW   APPearance CLOUDY (A) CLEAR   Specific Gravity, Urine 1.021 1.005 - 1.030   pH 8.5 (H) 5.0 - 8.0   Glucose, UA 100 (A) NEGATIVE mg/dL   Hgb urine dipstick NEGATIVE NEGATIVE   Bilirubin Urine NEGATIVE NEGATIVE   Ketones, ur 15 (A) NEGATIVE mg/dL   Protein, ur 100 (A) NEGATIVE mg/dL   Nitrite NEGATIVE NEGATIVE   Leukocytes, UA NEGATIVE NEGATIVE  Urine  microscopic-add on  Result Value Ref Range   Squamous Epithelial / LPF 0-5 (A) NONE SEEN   WBC, UA 0-5 0 - 5 WBC/hpf   RBC / HPF 0-5 0 - 5 RBC/hpf   Bacteria, UA RARE (A) NONE SEEN   Urine-Other MUCOUS PRESENT   I-Stat CG4 Lactic Acid, ED  Result Value Ref Range   Lactic Acid, Venous 2.05 (HH) 0.5 - 2.0 mmol/L   Comment NOTIFIED PHYSICIAN    Imaging Review Dg Abd Acute W/chest  02/08/2015  CLINICAL DATA:  Mid abdominal pain and nausea and vomiting beginning today. EXAM: DG ABDOMEN ACUTE W/ 1V CHEST COMPARISON:  Chest radiograph on 08/27/2013 FINDINGS: There is no evidence of dilated bowel loops or free intraperitoneal air. Scattered air-fluid levels are seen throughout the colon as well as a moderate amount of stool. No evidence of free air. Numerous abdominal wall sutures noted. Heart size and mediastinal contours are within normal limits. Both lungs are clear. No evidence of pneumothorax or pleural effusion. Several old right rib fracture deformities are again noted. IMPRESSION: Nonspecific, nonobstructive bowel gas pattern. No active cardiopulmonary disease. Electronically Signed   By: Earle Gell M.D.   On: 02/08/2015 23:48   I have personally reviewed and evaluated these images and lab results as part of my medical decision-making.   EKG Interpretation   Date/Time:  Tuesday February 08 2015 22:30:45 EST Ventricular Rate:  69 PR Interval:  150 QRS Duration: 84 QT Interval:  414 QTC Calculation: 443 R Axis:   30 Text Interpretation:  Sinus rhythm RSR' in V1 or V2, right VCD or RVH  Borderline T abnormalities, diffuse leads When compared with ECG of  02/15/2012, No significant change was found Confirmed by [facility]  MD, Margie Urbanowicz  ([id number]) on 02/08/2015 10:36:12 PM      MDM   Final diagnoses:  Abdominal pain, unspecified abdominal location  Nausea and vomiting, vomiting of unspecified type  Renal insufficiency    Abdominal pain with vomiting suspicious for bowel obstruction. He is  being sent for abdominal x-rays. Old records were reviewed confirming urgent care visit earlier today.  Abdominal x-rays are read by radiologist as nonspecific but I am concerned that it actually does show obstruction. He will be sent for CT of abdomen and pelvis. Because of renal insufficiency, scan will be done without IV contrast. Case is signed out to Dr. Dina Rich to evaluate CT scan.  Delora Fuel, MD [id number]

## 2015-02-08 NOTE — ED Notes (Signed)
Urgent care called and advised Roy [name] was going to be brought by shuttle to be evaluated for a possible bowel obstruction.  I was advised he was nauseated and vomiting and was given a 4mg  OTC Zofran.

## 2015-02-08 NOTE — ED Notes (Signed)
The patient presented to [facility] with a complaint of N/V that started this afternoon.

## 2015-02-08 NOTE — ED Provider Notes (Signed)
CSN: [id number]     Arrival date & time 02/08/15  1945 History   None    Chief Complaint  Patient presents with  . Emesis  . Abdominal Cramping   (Consider location/radiation/quality/duration/timing/severity/associated sxs/prior Treatment) HPI History obtained from patient:   LOCATION: Abdomen SEVERITY: 8 DURATION: Several hours today CONTEXT: Sudden onset after vomiting multiple surgeries in the past on his abdomen. QUALITY: MODIFYING FACTORS: Trying to drink fluids ASSOCIATED SYMPTOMS: Severe abdominal pain TIMING: Constant OCCUPATION: Retired  Past Medical History  Diagnosis Date  . CAD (coronary artery disease)   . Hyperlipidemia   . Hypertension   . Myocardial infarction ([place])   . Anginal pain ([place])   . Shortness of breath   . Lyme disease     states had shakes that were thought to be parkinson's related but related to tick bite, states also RMSF  . Sleep apnea     uses cpap  . Diabetes mellitus, type 2 ([place])   . Heart murmur   . Fever 12/26/2011  . Chronic kidney disease     renal insufficiency  . GERD (gastroesophageal reflux disease)   . Arthritis     spine  . Chest pain on exertion 02/2012  . CAP (community acquired pneumonia) 12/26/2011  . Asthma    Past Surgical History  Procedure Laterality Date  . Cervical laminectomy    . Cholecystectomy    . Appendectomy    . Kidney stone surgery    . Left heart cath N/A 02/15/2012    Procedure: LEFT HEART CATH;  Surgeon: Larey Dresser, MD;  Location: [facility] CATH LAB;  Service: Cardiovascular;  Laterality: N/A;   Family History  Problem Relation Age of Onset  . ALS Father   . Asthma Daughter   . Cancer Sister     breast/liver   Social History  Substance Use Topics  . Smoking status: Former Smoker -- 1.50 packs/day for 8 years    Types: Cigarettes    Quit date: 03/05/1958  . Smokeless tobacco: Never Used  . Alcohol Use: No    Review of Systems Positive for vomiting and abdominal pain Negative for  diarrhea, fever, chest pain, shortness of breath Allergies  Codeine sulfate and Cephalexin  Home Medications   Prior to Admission medications   Medication Sig Start Date End Date Taking? Authorizing Provider  ACCU-CHEK AVIVA PLUS test strip 1 each by Other route daily. 12/02/13   Historical Provider, MD  acetaminophen (TYLENOL) 325 MG tablet Take 650 mg by mouth every evening.    Historical Provider, MD  albuterol (PROVENTIL HFA;VENTOLIN HFA) 108 (90 BASE) MCG/ACT inhaler Inhale 2 puffs into the lungs every 4 (four) hours as needed for wheezing or shortness of breath. 10/21/13   Zenaida Niece, PA-C  aspirin EC 81 MG tablet Take 1 tablet (81 mg total) by mouth daily. 03/11/12   Larey Dresser, MD  ASSURE COMFORT LANCETS 30G MISC 1 each by Other route daily. 12/02/13   Historical Provider, MD  atorvastatin (LIPITOR) 10 MG tablet TAKE 1 TABLET BY MOUTH ONCE A DAY 04/21/14   Marin Olp, MD  betamethasone dipropionate (DIPROLENE) 0.05 % ointment Apply 1 application topically daily. To outside of ear. 1 week maximum. 04/21/14   Marin Olp, MD  budesonide-formoterol [facility]) 160-4.5 MCG/ACT inhaler Inhale 2 puffs into the lungs 2 (two) times daily. 08/31/14   Marin Olp, MD  calcium-vitamin D (OSCAL WITH D) 500-200 MG-UNIT per tablet Take 1 tablet by mouth daily.  Historical Provider, MD  glimepiride (AMARYL) 4 MG tablet TAKE 1 TABLET BY MOUTH ONCE A DAY BEFORE BREAKFAST 04/21/14   Marin Olp, MD  isosorbide mononitrate (IMDUR) 30 MG 24 hr tablet TAKE THREE TABLETS BY MOUTH ONCE DAILY 08/27/14   Marin Olp, MD  metoprolol succinate (TOPROL-XL) 50 MG 24 hr tablet TAKE ONE TABLET BY MOUTH ONCE DAILY. TAKE WITH OR IMMEDIATELY FOLLOWING A MEAL 12/25/14   Marin Olp, MD  nitroGLYCERIN (NITROSTAT) 0.4 MG SL tablet Place 1 tablet (0.4 mg total) under the tongue every 5 (five) minutes as needed. For chest pain 06/14/14   Larey Dresser, MD  Omega-3 Fatty Acids (FISH OIL PO)  Take 2 capsules by mouth 2 (two) times daily.    Historical Provider, MD  Polyethyl Glycol-Propyl Glycol (SYSTANE OP) Place 1 drop into both eyes 4 (four) times daily. Itchy/dry eye    Historical Provider, MD  potassium citrate (UROCIT-K) 10 MEQ (1080 MG) SR tablet Take 10 mEq by mouth Twice daily. 01/29/12   Historical Provider, MD  RANEXA 500 MG 12 hr tablet TAKE ONE TABLET BY MOUTH TWICE DAILY 12/27/14   Larey Dresser, MD  ranitidine (ZANTAC) 300 MG tablet TAKE 1 TABLET BY MOUTH TWICE A DAY 08/10/14   Marin Olp, MD  Spacer/Aero-Holding Chambers (AEROCHAMBER MV) inhaler Use as instructed 07/13/14   Juanito Doom, MD  traMADol (ULTRAM) 50 MG tablet TAKE ONE TABLET BY MOUTH WITH TYLENOL 325MG   EVERY 6 HOURS AS NEEDED FOR PAIN 11/15/14   Marin Olp, MD  traZODone (DESYREL) 150 MG tablet TAKE ONE TABLET BY MOUTH ONCE DAILY 01/31/15   Marin Olp, MD  zinc sulfate 220 MG capsule Take 220 mg by mouth daily.    Historical Provider, MD   Meds Ordered and Administered this Visit   Medications  ondansetron (ZOFRAN-ODT) disintegrating tablet 4 mg (4 mg Oral Given 02/08/15 2007)    BP 157/78 mmHg  Pulse 72  Temp(Src) 99.6 F (37.6 C) (Oral)  Resp 18  SpO2 99% No data found.   Physical Exam  Constitutional: He is oriented to person, place, and time. He appears well-developed. He appears distressed.  Cardiovascular: Normal rate.   Pulmonary/Chest: Effort normal and breath sounds normal.  Abdominal: He exhibits distension. There is tenderness. There is guarding. There is no rebound.  Musculoskeletal: Normal range of motion.  Neurological: He is alert and oriented to person, place, and time.  Skin: Skin is warm and dry.  Psychiatric: He has a normal mood and affect. His behavior is normal. Judgment and thought content normal.  Nursing note and vitals reviewed.   ED Course  Procedures (including critical care time)  Labs Review Labs Reviewed - No data to  display  Imaging Review No results found.   Visual Acuity Review  Right Eye Distance:   Left Eye Distance:   Bilateral Distance:    Right Eye Near:   Left Eye Near:    Bilateral Near:         MDM   1. Non-intractable vomiting without nausea, unspecified vomiting type   2. Generalized abdominal pain    presentation is highly suspicious for bowel obstruction patient is referred to emergency department for further evaluation and treatment and he is administered Zofran here for his nausea. No laboratory data is obtained here. No imaging studies. Plan emergent transport to emergency department    Konrad Felix, [place] 02/08/15 2018

## 2015-02-08 NOTE — ED Notes (Signed)
Brought pt back to room from triage pt states he was having cp that was radiating to his back performed EKG and gave into MD. RN aware

## 2015-02-08 NOTE — ED Notes (Signed)
Pt. reports intermittent pain across abdomen onset 2 pm this afternoon with multiple emesis , denies diarrhea or fever , last BM last night , seen at [facility] urgent care received ODT Zofran with slight relief , sent here for further testing and treatment .

## 2015-02-09 ENCOUNTER — Encounter (HOSPITAL_COMMUNITY): Payer: Self-pay | Admitting: Radiology

## 2015-02-09 ENCOUNTER — Emergency Department (HOSPITAL_COMMUNITY): Payer: PPO

## 2015-02-09 LAB — I-STAT CG4 LACTIC ACID, ED: Lactic Acid, Venous: 0.88 mmol/L (ref 0.5–2.0)

## 2015-02-09 MED ORDER — ONDANSETRON HCL 4 MG/2ML IJ SOLN
INTRAMUSCULAR | Status: AC
  Filled 2015-02-09: qty 2

## 2015-02-09 MED ORDER — HYDROCODONE-ACETAMINOPHEN 5-325 MG PO TABS: 1.0000 | ORAL_TABLET | Freq: Four times a day (QID) | ORAL | Status: DC | PRN

## 2015-02-09 MED ORDER — BARIUM SULFATE 2.1 % PO SUSP
ORAL | Status: AC
  Administered 2015-02-09: 04:00:00
  Filled 2015-02-09: qty 2

## 2015-02-09 MED ORDER — ONDANSETRON HCL 4 MG/2ML IJ SOLN
4.0000 mg | Freq: Once | INTRAMUSCULAR | Status: AC
  Administered 2015-02-09: 4 mg via INTRAVENOUS

## 2015-02-09 MED ORDER — ONDANSETRON 4 MG PO TBDP: 4.0000 mg | ORAL_TABLET | Freq: Three times a day (TID) | ORAL | Status: DC | PRN

## 2015-02-09 NOTE — ED Provider Notes (Signed)
Patient signed out pending CT scan. CT scan is negative for obstruction. Patient continues to report persistent nausea but is able to tolerate fluids. Discussed with patient supportive care at home including anti-emetics. He and his wife were given strict return precautions including worsening pain, vomiting, or any new or worsening symptoms.  Results for orders placed or performed during the hospital encounter of 02/08/15  Lipase, blood  Result Value Ref Range   Lipase 23 11 - 51 U/L  Comprehensive metabolic panel  Result Value Ref Range   Sodium 139 135 - 145 mmol/L   Potassium 3.8 3.5 - 5.1 mmol/L   Chloride 98 (L) 101 - 111 mmol/L   CO2 27 22 - 32 mmol/L   Glucose, Bld 199 (H) 65 - 99 mg/dL   BUN 19 6 - 20 mg/dL   Creatinine, Ser 1.73 (H) 0.61 - 1.24 mg/dL   Calcium 10.4 (H) 8.9 - 10.3 mg/dL   Total Protein 7.6 6.5 - 8.1 g/dL   Albumin 4.3 3.5 - 5.0 g/dL   AST 18 15 - 41 U/L   ALT 20 17 - 63 U/L   Alkaline Phosphatase 65 38 - 126 U/L   Total Bilirubin 1.2 0.3 - 1.2 mg/dL   GFR calc non Af Amer 36 (L) >60 mL/min   GFR calc Af Amer 41 (L) >60 mL/min   Anion gap 14 5 - 15  CBC  Result Value Ref Range   WBC 11.6 (H) 4.0 - 10.5 K/uL   RBC 5.07 4.22 - 5.81 MIL/uL   Hemoglobin 14.7 13.0 - 17.0 g/dL   HCT 43.4 39.0 - 52.0 %   MCV 85.6 78.0 - 100.0 fL   MCH 29.0 26.0 - 34.0 pg   MCHC 33.9 30.0 - 36.0 g/dL   RDW 13.7 11.5 - 15.5 %   Platelets 127 (L) 150 - 400 K/uL  Urinalysis, Routine w reflex microscopic (not at [facility])  Result Value Ref Range   Color, Urine YELLOW YELLOW   APPearance CLOUDY (A) CLEAR   Specific Gravity, Urine 1.021 1.005 - 1.030   pH 8.5 (H) 5.0 - 8.0   Glucose, UA 100 (A) NEGATIVE mg/dL   Hgb urine dipstick NEGATIVE NEGATIVE   Bilirubin Urine NEGATIVE NEGATIVE   Ketones, ur 15 (A) NEGATIVE mg/dL   Protein, ur 100 (A) NEGATIVE mg/dL   Nitrite NEGATIVE NEGATIVE   Leukocytes, UA NEGATIVE NEGATIVE  Urine microscopic-add on  Result Value Ref Range   Squamous  Epithelial / LPF 0-5 (A) NONE SEEN   WBC, UA 0-5 0 - 5 WBC/hpf   RBC / HPF 0-5 0 - 5 RBC/hpf   Bacteria, UA RARE (A) NONE SEEN   Urine-Other MUCOUS PRESENT   I-Stat CG4 Lactic Acid, ED  Result Value Ref Range   Lactic Acid, Venous 2.05 (HH) 0.5 - 2.0 mmol/L   Comment NOTIFIED PHYSICIAN   I-Stat CG4 Lactic Acid, ED  Result Value Ref Range   Lactic Acid, Venous 0.88 0.5 - 2.0 mmol/L   Ct Abdomen Pelvis Wo Contrast  02/09/2015  CLINICAL DATA:  Acute onset of intermittent generalized abdominal pain and vomiting. Initial encounter. EXAM: CT ABDOMEN AND PELVIS WITHOUT CONTRAST TECHNIQUE: Multidetector CT imaging of the abdomen and pelvis was performed following the standard protocol without IV contrast. COMPARISON:  CT of the abdomen and pelvis performed 02/15/2009, and abdominal radiograph performed 02/08/2015 FINDINGS: Minimal right basilar atelectasis is noted. Scattered coronary artery calcifications are seen. The liver and spleen are unremarkable in appearance. The  patient is status post cholecystectomy. The pancreas and adrenal glands are unremarkable. Nonspecific perinephric stranding is noted bilaterally. Scattered small bilateral renal stones are seen, measuring up to 3 mm in size. Bilateral renal cysts are seen, measuring up to 7.0 cm in size. There is no evidence of hydronephrosis. No obstructing ureteral stones are seen. No free fluid is identified. The small bowel is unremarkable in appearance. The stomach is within normal limits. No acute vascular abnormalities are seen. Mild scattered calcification is noted along the abdominal aorta and its branches. Postoperative change is noted along the midline anterior abdominal wall. The patient is status post appendectomy. Mild diverticulosis is noted along the transverse, descending and sigmoid colon, without evidence of diverticulitis. The bladder is mildly distended and grossly unremarkable. The prostate is mildly enlarged, measuring 5.0 cm in  transverse dimension. A small left inguinal hernia is seen, containing only fat. No inguinal lymphadenopathy is seen. No acute osseous abnormalities are identified. Vacuum phenomenon is noted at L5-S1. IMPRESSION: 1. No acute abnormality seen within the abdomen or pelvis. 2. Scattered small bilateral renal stones, measuring up to 3 mm in size. Bilateral renal cysts seen. 3. Mild scattered calcification along the abdominal aorta and its branches. 4. Mild diverticulosis along the transverse, descending and sigmoid colon, without evidence of diverticulitis. 5. Small left inguinal hernia, containing only fat. 6. Mildly enlarged prostate. 7. Scattered for artery calcifications seen. Electronically Signed   By: Garald Balding M.D.   On: 02/09/2015 03:27   Dg Abd Acute W/chest  02/08/2015  CLINICAL DATA:  Mid abdominal pain and nausea and vomiting beginning today. EXAM: DG ABDOMEN ACUTE W/ 1V CHEST COMPARISON:  Chest radiograph on 08/27/2013 FINDINGS: There is no evidence of dilated bowel loops or free intraperitoneal air. Scattered air-fluid levels are seen throughout the colon as well as a moderate amount of stool. No evidence of free air. Numerous abdominal wall sutures noted. Heart size and mediastinal contours are within normal limits. Both lungs are clear. No evidence of pneumothorax or pleural effusion. Several old right rib fracture deformities are again noted. IMPRESSION: Nonspecific, nonobstructive bowel gas pattern. No active cardiopulmonary disease. Electronically Signed   By: Earle Gell M.D.   On: 02/08/2015 23:48      Merryl Hacker, MD 02/09/15 [phone]

## 2015-02-09 NOTE — Discharge Instructions (Signed)
You were seen today for abdominal pain and nausea. Her CT scan and lab work is reassuring.  He will be discharged home with nausea medication. Follow-up with her primary physician for recheck of your kidney function. If you have any new or worsening symptoms you should be reevaluated immediately.  Abdominal Pain, Adult Many things can cause abdominal pain. Usually, abdominal pain is not caused by a disease and will improve without treatment. It can often be observed and treated at home. Your health care provider will do a physical exam and possibly order blood tests and X-rays to help determine the seriousness of your pain. However, in many cases, more time must pass before a clear cause of the pain can be found. Before that point, your health care provider may not know if you need more testing or further treatment. HOME CARE INSTRUCTIONS Monitor your abdominal pain for any changes. The following actions may help to alleviate any discomfort you are experiencing:  Only take over-the-counter or prescription medicines as directed by your health care provider.  Do not take laxatives unless directed to do so by your health care provider.  Try a clear liquid diet (broth, tea, or water) as directed by your health care provider. Slowly move to a bland diet as tolerated. SEEK MEDICAL CARE IF:  You have unexplained abdominal pain.  You have abdominal pain associated with nausea or diarrhea.  You have pain when you urinate or have a bowel movement.  You experience abdominal pain that wakes you in the night.  You have abdominal pain that is worsened or improved by eating food.  You have abdominal pain that is worsened with eating fatty foods.  You have a fever. SEEK IMMEDIATE MEDICAL CARE IF:  Your pain does not go away within 2 hours.  You keep throwing up (vomiting).  Your pain is felt only in portions of the abdomen, such as the right side or the left lower portion of the abdomen.  You pass  bloody or black tarry stools. MAKE SURE YOU:  Understand these instructions.  Will watch your condition.  Will get help right away if you are not doing well or get worse.   This information is not intended to replace advice given to you by your health care provider. Make sure you discuss any questions you have with your health care provider.   Document Released: 11/29/2004 Document Revised: 11/10/2014 Document Reviewed: 10/29/2012 Elsevier Interactive Patient Education [employer].

## 2015-02-09 NOTE — ED Notes (Signed)
Family at bedside. 

## 2015-02-10 ENCOUNTER — Encounter: Payer: Self-pay | Admitting: Cardiology

## 2015-02-14 ENCOUNTER — Other Ambulatory Visit: Payer: Self-pay | Admitting: Cardiology

## 2015-02-17 ENCOUNTER — Encounter: Payer: Self-pay | Admitting: Cardiology

## 2015-02-17 ENCOUNTER — Ambulatory Visit (INDEPENDENT_AMBULATORY_CARE_PROVIDER_SITE_OTHER): Payer: PPO | Admitting: Cardiology

## 2015-02-17 VITALS — BP 120/60 | HR 60 | Ht 70.0 in | Wt 195.0 lb

## 2015-02-17 DIAGNOSIS — N183 Chronic kidney disease, stage 3 unspecified: Secondary | ICD-10-CM

## 2015-02-17 DIAGNOSIS — I10 Essential (primary) hypertension: Secondary | ICD-10-CM | POA: Diagnosis not present

## 2015-02-17 DIAGNOSIS — I2510 Atherosclerotic heart disease of native coronary artery without angina pectoris: Secondary | ICD-10-CM

## 2015-02-17 DIAGNOSIS — E785 Hyperlipidemia, unspecified: Secondary | ICD-10-CM

## 2015-02-17 MED ORDER — RANOLAZINE ER 500 MG PO TB12: 500.0000 mg | ORAL_TABLET | Freq: Two times a day (BID) | ORAL | Status: DC

## 2015-02-17 NOTE — Patient Instructions (Signed)
Medication Instructions:  Your physician recommends that you continue on your current medications as directed. Please refer to the Current Medication list given to you today.  Labwork: No new orders.   Testing/Procedures: No new orders.   Follow-Up: Your physician wants you to follow-up in: 1 YEAR with Dr Aundra Dubin.  You will receive a reminder letter in the mail two months in advance. If you don't receive a letter, please call our office to schedule the follow-up appointment.   Any Other Special Instructions Will Be Listed Below (If Applicable).     If you need a refill on your cardiac medications before your next appointment, please call your pharmacy.

## 2015-02-19 NOTE — Progress Notes (Signed)
Patient ID: Roy [name], male   DOB: 29-Dec-1932, 81 y.o.   MRN: [id number] PCP: Dr. Yong Channel  81 yo with history of CAD s/p PTCA D1 in 1999, CKD, diabetes, HTN, and hyperlipidemia presents for cardiology followup.  In the fall of 2013, he developed exertional chest pain. ETT-myoview in 10/13 showed ischemic ECG changes but no TID and only a small fixed basal inferior defect with normal wall motion and EF 65%.   This was overall a low risk study.  Echo in 10/13 showed EF 55-60% with no major abnormalities.  Given ongoing symptoms, I admitted him overnight for hydration then cathed him in 12/13.  This showed nonobstructive CAD.  Exertional chest pain thought to be due to small vessel disease.  PFTs showed only mild obstructive airways disease.  I started him on ranolazine and his symptoms resolved.  On ranolazine, he really has not had any chest pain.  He is active, doing yardwork and training his dogs.  No exertional dyspnea.  No claudication.  He has been diagnosed with asthma and is now using inhalers.    Labs (5/13): LDL 37, HDL 33 Labs (8/13): BNP 38 Labs (10/13): K 4, creatinine 2 => 1.94, BNP 53 Labs (12/13): K 4.2, creatinine 1.8 Labs (1/14): K 3.9, creatinine 2.1, LDL 46 Labs (6/14): K 4.7, creatinine 1.6 Labs (10/14): LDL 49, HDL 41 Labs (3/15): K 4.4, creatinine 1.7 Labs (8/15): LDL 76, HDL 30 Labs (2/16): K 4.2, creatinine 1.89 Labs (8/16): LDL 54, HDL 36 Labs (12/16): K 3.8, creatinine 1.73  ECG: NSR, nonspecific T wave changes, QTc 440 msec.   PMH: 1. H/o [place] Spotted Fever in 3/13.  2. H/o Lyme disease in 2008. 3. CAD: PTCA D1 in 1999.  Adenosine myoview in (10/09) with EF 60%, no ischemia or infarction.  ETT-sestamibi (10/13) with 7'1" exercise, ischemic ECG changes, small fixed mild basal inferior defect with EF 65% and normal wall motion.  TID not elevated.  This was a low risk study overall.  Echo (10/13): EF 55-60%, normal RV size and systolic function, normal valves.   LHC (12/13): Nonobstructive CAD, ? Small vessel disease as cause of his exertional chest pain.  Improved symptoms with ranolazine.  Echo (4/16) with EF 60-65%, mild biatrial enlargement.   4. CKD 5. H/o rheumatic fever in 1951 6. Type II diabetes 7. Asthma: PFTs (12/13) with mild obstructive airways disease.  CT chest showed no interstitial lung disease.  8. Hyperlipidemia 9. HTN  SH: Married, lives in [place].  Quit smoking in 1960.  Likes to hunt.    FH: CAD  ROS: All systems reviewed and negative except as per HPI.   Current Outpatient Prescriptions  Medication Sig Dispense Refill  . ACCU-CHEK AVIVA PLUS test strip 1 each by Other route daily.    [name] acetaminophen (TYLENOL) 325 MG tablet Take 650 mg by mouth every evening.    [name] albuterol (PROVENTIL HFA;VENTOLIN HFA) 108 (90 BASE) MCG/ACT inhaler Inhale 2 puffs into the lungs every 4 (four) hours as needed for wheezing or shortness of breath. 1 Inhaler 10  . aspirin EC 81 MG tablet Take 1 tablet (81 mg total) by mouth daily.    . ASSURE COMFORT LANCETS 30G MISC 1 each by Other route daily.    [name] atorvastatin (LIPITOR) 10 MG tablet TAKE 1 TABLET BY MOUTH ONCE A DAY 90 tablet 3  . betamethasone dipropionate (DIPROLENE) 0.05 % ointment Apply 1 application topically daily. To outside of ear. 1 week maximum.  30 g 1  . budesonide-formoterol (SYMBICORT) 160-4.5 MCG/ACT inhaler Inhale 2 puffs into the lungs 2 (two) times daily. 1 Inhaler 12  . calcium-vitamin D (OSCAL WITH D) 500-200 MG-UNIT per tablet Take 1 tablet by mouth daily.    [name] glimepiride (AMARYL) 4 MG tablet TAKE 1 TABLET BY MOUTH ONCE A DAY BEFORE BREAKFAST 90 tablet 3  . HYDROcodone-acetaminophen (NORCO/VICODIN) 5-325 MG tablet Take 1-2 tablets by mouth every 6 (six) hours as needed. 10 tablet 0  . isosorbide mononitrate (IMDUR) 30 MG 24 hr tablet TAKE THREE TABLETS BY MOUTH ONCE DAILY 180 tablet 3  . metoprolol succinate (TOPROL-XL) 50 MG 24 hr tablet TAKE ONE TABLET BY MOUTH  ONCE DAILY. TAKE WITH OR IMMEDIATELY FOLLOWING A MEAL 90 tablet 0  . nitroGLYCERIN (NITROSTAT) 0.4 MG SL tablet Place 1 tablet (0.4 mg total) under the tongue every 5 (five) minutes as needed. For chest pain 25 tablet 6  . Omega-3 Fatty Acids (FISH OIL PO) Take 2 capsules by mouth 2 (two) times daily.    . ondansetron (ZOFRAN ODT) 4 MG disintegrating tablet Take 1 tablet (4 mg total) by mouth every 8 (eight) hours as needed for nausea or vomiting. 20 tablet 0  . Polyethyl Glycol-Propyl Glycol (SYSTANE OP) Place 1 drop into both eyes 4 (four) times daily. Itchy/dry eye    . potassium citrate (UROCIT-K) 10 MEQ (1080 MG) SR tablet Take 10 mEq by mouth Twice daily.    . ranitidine (ZANTAC) 300 MG tablet TAKE 1 TABLET BY MOUTH TWICE A DAY 180 tablet 3  . ranolazine (RANEXA) 500 MG 12 hr tablet Take 1 tablet (500 mg total) by mouth 2 (two) times daily. 60 tablet 11  . Spacer/Aero-Holding Chambers (AEROCHAMBER MV) inhaler Use as instructed 1 each 0  . traMADol (ULTRAM) 50 MG tablet TAKE ONE TABLET BY MOUTH WITH TYLENOL 325MG   EVERY 6 HOURS AS NEEDED FOR PAIN 90 tablet 1  . traZODone (DESYREL) 150 MG tablet TAKE ONE TABLET BY MOUTH ONCE DAILY 90 tablet 2  . zinc sulfate 220 MG capsule Take 220 mg by mouth daily.     No current facility-administered medications for this visit.    BP 120/60 mmHg  Pulse 60  Ht 5\' 10"  (1.778 m)  Wt 195 lb (88.451 kg)  BMI 27.98 kg/m2 General: NAD Neck: No JVD, no thyromegaly or thyroid nodule.  Lungs: Clear to auscultation bilaterally with normal respiratory effort. CV: Nondisplaced PMI.  Heart regular S1/S2, no S3/S4, no murmur.  No peripheral edema.  No carotid bruit.  Normal pedal pulses.  Abdomen: Soft, nontender, no hepatosplenomegaly, no distention.  Neurologic: Alert and oriented x 3.  Psych: Normal affect. Extremities: No clubbing or cyanosis.   Assessment/Plan: 1. CAD: Nonobstructive CAD on 12/13 cath.  I think that his chest pain symptoms may have been  due to small vessel disease, which could explain the ischemic ECG response to exercise with only mild perfusion abnormality.  He has done quite well on ranolazine and is not having angina.  Continue statin, ASA 81, Toprol XL, ranolazine, and Imdur.   QTc normal on ranolazine today.  2. HTN: BP ok. 3. CKD: Last creatinine 1.7 (baseline).   4. Hyperlipidemia: Good lipids in 8/16.  5. Asthma: Followed by pulmonary.   [name] 02/19/2015

## 2015-02-23 ENCOUNTER — Encounter: Payer: Self-pay | Admitting: Family Medicine

## 2015-02-23 ENCOUNTER — Ambulatory Visit (INDEPENDENT_AMBULATORY_CARE_PROVIDER_SITE_OTHER): Payer: PPO | Admitting: Family Medicine

## 2015-02-23 VITALS — BP 126/60 | Temp 97.9°F | Ht 70.0 in | Wt 198.0 lb

## 2015-02-23 DIAGNOSIS — N183 Chronic kidney disease, stage 3 unspecified: Secondary | ICD-10-CM

## 2015-02-23 DIAGNOSIS — D696 Thrombocytopenia, unspecified: Secondary | ICD-10-CM

## 2015-02-23 DIAGNOSIS — E1122 Type 2 diabetes mellitus with diabetic chronic kidney disease: Secondary | ICD-10-CM

## 2015-02-23 DIAGNOSIS — I25119 Atherosclerotic heart disease of native coronary artery with unspecified angina pectoris: Secondary | ICD-10-CM | POA: Diagnosis not present

## 2015-02-23 LAB — COMPREHENSIVE METABOLIC PANEL
ALK PHOS: 76 U/L (ref 39–117)
ALT: 12 U/L (ref 0–53)
AST: 12 U/L (ref 0–37)
Albumin: 4.4 g/dL (ref 3.5–5.2)
BILIRUBIN TOTAL: 0.7 mg/dL (ref 0.2–1.2)
BUN: 32 mg/dL — AB (ref 6–23)
CALCIUM: 9.4 mg/dL (ref 8.4–10.5)
CO2: 27 meq/L (ref 19–32)
Chloride: 102 mEq/L (ref 96–112)
Creatinine, Ser: 1.77 mg/dL — ABNORMAL HIGH (ref 0.40–1.50)
GFR: 39.51 mL/min — ABNORMAL LOW (ref >60.00)
GLUCOSE: 301 mg/dL — AB (ref 70–99)
POTASSIUM: 3.9 meq/L (ref 3.5–5.1)
Sodium: 139 mEq/L (ref 135–145)
TOTAL PROTEIN: 6.8 g/dL (ref 6.0–8.3)

## 2015-02-23 LAB — HEMOGLOBIN A1C: Hgb A1c MFr Bld: 8 % — ABNORMAL HIGH (ref 4.6–6.5)

## 2015-02-23 LAB — MICROALBUMIN / CREATININE URINE RATIO
Creatinine,U: 113.7 mg/dL
MICROALB/CREAT RATIO: 2.4 mg/g (ref 0.0–30.0)
Microalb, Ur: 2.7 mg/dL — ABNORMAL HIGH (ref 0.0–1.9)

## 2015-02-23 LAB — CBC
HEMATOCRIT: 38.6 % — AB (ref 39.0–52.0)
Hemoglobin: 12.7 g/dL — ABNORMAL LOW (ref 13.0–17.0)
MCHC: 33 g/dL (ref 30.0–36.0)
MCV: 86.7 fl (ref 78.0–100.0)
Platelets: 141 10*3/uL — ABNORMAL LOW (ref 150.0–400.0)
RBC: 4.46 Mil/uL (ref 4.22–5.81)
RDW: 14.4 % (ref 11.5–15.5)
WBC: 8 10*3/uL (ref 4.0–10.5)

## 2015-02-23 MED ORDER — ISOSORBIDE MONONITRATE ER 30 MG PO TB24: ORAL_TABLET | ORAL | Status: DC

## 2015-02-23 MED ORDER — METOPROLOL SUCCINATE ER 50 MG PO TB24: ORAL_TABLET | ORAL | Status: DC

## 2015-02-23 MED ORDER — ATORVASTATIN CALCIUM 10 MG PO TABS: ORAL_TABLET | ORAL | Status: DC

## 2015-02-23 NOTE — Assessment & Plan Note (Signed)
S: continues to follow with Dr. Aundra Dubin. Small vessel disease as cause of chest pain with nonobstructive CAD on cath 02/2012. Requires imdur and ranexa to control angina A/P: continue cards follow up as well as ranexa and imdur. Continue metoprolol and aspirin as well.

## 2015-02-23 NOTE — Progress Notes (Signed)
Pre visit review using our clinic review tool, if applicable. No additional management support is needed unless otherwise documented below in the visit note. 

## 2015-02-23 NOTE — Progress Notes (Signed)
[name], MD  Subjective:  Roy [name] is a 81 y.o. year old very pleasant male patient who presents with:  See problem oriented charting ROS- shortness of breath due to asthma improves with his treatments, no chest pain while on ranexa and imdur. Denies headaches or blurry vision.   Past Medical History-  Patient Active Problem List   Diagnosis Date Noted  . Type II diabetes mellitus with renal manifestations ([place]) 09/11/2006    Priority: High  . Coronary artery disease with angina pectoris ([place]) 09/11/2006    Priority: High  . Thrombocytopenia ([place]) 02/23/2015    Priority: Medium  . Mild persistent asthma 10/30/2010    Priority: Medium  . CKD (chronic kidney disease), stage III 02/22/2009    Priority: Medium  . Hyperlipidemia 09/11/2006    Priority: Medium  . Essential hypertension 09/11/2006    Priority: Medium  . Back pain 09/11/2006    Priority: Medium  . Sleep apnea 09/11/2006    Priority: Medium  . GERD (gastroesophageal reflux disease) 05/03/2014    Priority: Low  . Insomnia 05/03/2014    Priority: Low  . Allergic rhinitis 04/21/2014    Priority: Low  . Giardia 04/21/2014    Priority: Low  . NEPHROLITHIASIS, RECURRENT 01/21/2009    Priority: Low  . ACTINIC KERATOSIS, HEAD 06/14/2008    Priority: Low    Medications- reviewed and updated Current Outpatient Prescriptions  Medication Sig Dispense Refill  . ACCU-CHEK AVIVA PLUS test strip 1 each by Other route daily.    [name] acetaminophen (TYLENOL) 325 MG tablet Take 650 mg by mouth every evening.    [name] aspirin EC 81 MG tablet Take 1 tablet (81 mg total) by mouth daily.    [name] atorvastatin (LIPITOR) 10 MG tablet TAKE 1 TABLET BY MOUTH ONCE A DAY 90 tablet 3  . betamethasone dipropionate (DIPROLENE) 0.05 % ointment Apply 1 application topically daily. To outside of ear. 1 week maximum. 30 g 1  . budesonide-formoterol (SYMBICORT) 160-4.5 MCG/ACT inhaler Inhale 2 puffs into the lungs 2 (two) times daily. 1 Inhaler  12  . calcium-vitamin D (OSCAL WITH D) 500-200 MG-UNIT per tablet Take 1 tablet by mouth daily.    [name] glimepiride (AMARYL) 4 MG tablet TAKE 1 TABLET BY MOUTH ONCE A DAY BEFORE BREAKFAST 90 tablet 3  . HYDROcodone-acetaminophen (NORCO/VICODIN) 5-325 MG tablet Take 1-2 tablets by mouth every 6 (six) hours as needed. 10 tablet 0  . isosorbide mononitrate (IMDUR) 30 MG 24 hr tablet TAKE THREE TABLETS BY MOUTH ONCE DAILY 180 tablet 3  . metoprolol succinate (TOPROL-XL) 50 MG 24 hr tablet TAKE ONE TABLET BY MOUTH ONCE DAILY. TAKE WITH OR IMMEDIATELY FOLLOWING A MEAL 90 tablet 0  . nitroGLYCERIN (NITROSTAT) 0.4 MG SL tablet Place 1 tablet (0.4 mg total) under the tongue every 5 (five) minutes as needed. For chest pain 25 tablet 6  . Omega-3 Fatty Acids (FISH OIL PO) Take 2 capsules by mouth 2 (two) times daily.    [name] (SYSTANE OP) Place 1 drop into both eyes 4 (four) times daily. Itchy/dry eye    . ranitidine (ZANTAC) 300 MG tablet TAKE 1 TABLET BY MOUTH TWICE A DAY 180 tablet 3  . ranolazine (RANEXA) 500 MG 12 hr tablet Take 1 tablet (500 mg total) by mouth 2 (two) times daily. 60 tablet 11  . Spacer/Aero-Holding Chambers (AEROCHAMBER MV) inhaler Use as instructed 1 each 0  . traMADol (ULTRAM) 50 MG tablet TAKE ONE TABLET BY  MOUTH WITH TYLENOL 325MG   EVERY 6 HOURS AS NEEDED FOR PAIN 90 tablet 1  . traZODone (DESYREL) 150 MG tablet TAKE ONE TABLET BY MOUTH ONCE DAILY 90 tablet 2  . zinc sulfate 220 MG capsule Take 220 mg by mouth daily.    [name] albuterol (PROVENTIL HFA;VENTOLIN HFA) 108 (90 BASE) MCG/ACT inhaler Inhale 2 puffs into the lungs every 4 (four) hours as needed for wheezing or shortness of breath. (Patient not taking: Reported on 02/23/2015) 1 Inhaler 10  . ASSURE COMFORT LANCETS 30G MISC 1 each by Other route daily.    . ondansetron (ZOFRAN ODT) 4 MG disintegrating tablet Take 1 tablet (4 mg total) by mouth every 8 (eight) hours as needed for nausea or vomiting.  (Patient not taking: Reported on 02/23/2015) 20 tablet 0  . potassium citrate (UROCIT-K) 10 MEQ (1080 MG) SR tablet Take 10 mEq by mouth Twice daily.     No current facility-administered medications for this visit.    Objective: BP 126/60 mmHg  Temp(Src) 97.9 F (36.6 C) (Oral)  Ht 5\' 10"  (1.778 m)  Wt 198 lb (89.812 kg)  BMI 28.41 kg/m2 Gen: NAD, resting comfortably, appears younger than stated age Slight runny nose CV: RRR no murmurs rubs or gallops Lungs: CTAB no crackles, wheeze, rhonchi Abdomen: soft/nontender/nondistended/normal bowel sounds.  Ext: no edema, 2+ PT pulses Skin: warm, dry Neuro: grossly normal, moves all extremities  Diabetic Foot Exam - Simple   Simple Foot Form  Diabetic Foot exam was performed with the following findings:  Yes 02/23/2015  8:24 PM  Visual Inspection  No deformities, no ulcerations, no other skin breakdown bilaterally:  Yes  Sensation Testing  Intact to touch and monofilament testing bilaterally:  Yes  Pulse Check  Posterior Tibialis and Dorsalis pulse intact bilaterally:  Yes  Comments       Assessment/Plan:  Type II diabetes mellitus with renal manifestations ([place]) S: poorly controlled. On amaryl 4mg  CBGs- 209 at peak. No lows. After a few days back down. Yesterday was 130 fasting Exercise and diet- some poor choices  Lab Results  Component Value Date   HGBA1C 8.0* 02/23/2015   HGBA1C 7.1* 05/03/2014   HGBA1C 7.0* 10/21/2013   A/P: titrate to 6mg  amaryl and follow up in 3.5 months. Cautious of hypoglycemia given CKD or would go to 8.  Needs tighter diabetes control to protect kidneys as well. We will try to get on ace-i   CKD (chronic kidney disease), stage III S: largely stable with GFR ranging from low 30s to 40s. Blood pressure is controlled. On lipid agent. Not on ace-i despite being diabetic and likely cause of CKD A/P: trial 2.5mg  lisinopril and follow up bmet in 2 weeks to make sure renal artery stenosis not at play.     Coronary artery disease with angina pectoris ([place]) S: continues to follow with Dr. Aundra Dubin. Small vessel disease as cause of chest pain with nonobstructive CAD on cath 02/2012. Requires imdur and ranexa to control angina A/P: continue cards follow up as well as ranexa and imdur. Continue metoprolol and aspirin as well.    Thrombocytopenia ([place]) S: mild thombocytopenia 100-150 for several years without worsening pattern A/P: stable today, we will continue to trend. CBC to also follow mild anemia noted on CBC today   3.5 month follow up per phone call Return precautions advised.   Orders Placed This Encounter  Procedures  . CBC    [place]  . Comprehensive metabolic panel    [place]  .  Hemoglobin A1c    [place]  . Microalbumin / creatinine urine ratio    [place]    Meds ordered this encounter  Medications  . isosorbide mononitrate (IMDUR) 30 MG 24 hr tablet    Sig: TAKE THREE TABLETS BY MOUTH ONCE DAILY    Dispense:  180 tablet    Refill:  3  . metoprolol succinate (TOPROL-XL) 50 MG 24 hr tablet    Sig: TAKE ONE TABLET BY MOUTH ONCE DAILY. TAKE WITH OR IMMEDIATELY FOLLOWING A MEAL    Dispense:  90 tablet    Refill:  0  . atorvastatin (LIPITOR) 10 MG tablet    Sig: TAKE 1 TABLET BY MOUTH ONCE A DAY    Dispense:  90 tablet    Refill:  3

## 2015-02-23 NOTE — Assessment & Plan Note (Signed)
S: poorly controlled. On amaryl 4mg  CBGs- 209 at peak. No lows. After a few days back down. Yesterday was 130 fasting Exercise and diet- some poor choices  Lab Results  Component Value Date   HGBA1C 8.0* 02/23/2015   HGBA1C 7.1* 05/03/2014   HGBA1C 7.0* 10/21/2013   A/P: titrate to 6mg  amaryl and follow up in 3.5 months. Cautious of hypoglycemia given CKD or would go to 8.  Needs tighter diabetes control to protect kidneys as well. We will try to get on ace-i

## 2015-02-23 NOTE — Assessment & Plan Note (Signed)
S: mild thombocytopenia 100-150 for several years without worsening pattern A/P: stable today, we will continue to trend. CBC to also follow mild anemia noted on CBC today

## 2015-02-23 NOTE — Assessment & Plan Note (Signed)
S: largely stable with GFR ranging from low 30s to 40s. Blood pressure is controlled. On lipid agent. Not on ace-i despite being diabetic and likely cause of CKD A/P: trial 2.5mg  lisinopril and follow up bmet in 2 weeks to make sure renal artery stenosis not at play.

## 2015-02-23 NOTE — Patient Instructions (Addendum)
Labs before you go  There is a medicine I want to put you on to help protect your kidneys and if kidney function is stable we will call you to start this. If we start this, would do a repeat test in 2 weeks for kidney function because in most cases it helps the kidneys but there is a rare person that it may hurt the kidney  Health Maintenance Due  Topic Date Due  . FOOT EXAM - [employer] job!!!! 05/19/2014  . URINE MICROALBUMIN - today 10/22/2014  . HEMOGLOBIN A1C - today 11/01/2014

## 2015-02-24 ENCOUNTER — Other Ambulatory Visit: Payer: Self-pay

## 2015-02-24 ENCOUNTER — Other Ambulatory Visit: Payer: Self-pay | Admitting: Family Medicine

## 2015-02-24 DIAGNOSIS — E1122 Type 2 diabetes mellitus with diabetic chronic kidney disease: Secondary | ICD-10-CM

## 2015-02-24 DIAGNOSIS — N183 Chronic kidney disease, stage 3 (moderate): Principal | ICD-10-CM

## 2015-02-24 DIAGNOSIS — E119 Type 2 diabetes mellitus without complications: Secondary | ICD-10-CM

## 2015-02-24 MED ORDER — LISINOPRIL 5 MG PO TABS: 2.5000 mg | ORAL_TABLET | Freq: Every day | ORAL | Status: DC

## 2015-02-24 MED ORDER — GLIMEPIRIDE 4 MG PO TABS: ORAL_TABLET | ORAL | Status: DC

## 2015-03-10 ENCOUNTER — Other Ambulatory Visit (INDEPENDENT_AMBULATORY_CARE_PROVIDER_SITE_OTHER): Payer: PPO

## 2015-03-10 DIAGNOSIS — E1122 Type 2 diabetes mellitus with diabetic chronic kidney disease: Secondary | ICD-10-CM | POA: Diagnosis not present

## 2015-03-10 DIAGNOSIS — N183 Chronic kidney disease, stage 3 unspecified: Secondary | ICD-10-CM

## 2015-03-10 LAB — BASIC METABOLIC PANEL
BUN: 26 mg/dL — ABNORMAL HIGH (ref 6–23)
CHLORIDE: 103 meq/L (ref 96–112)
CO2: 29 mEq/L (ref 19–32)
Calcium: 9.5 mg/dL (ref 8.4–10.5)
Creatinine, Ser: 1.65 mg/dL — ABNORMAL HIGH (ref 0.40–1.50)
GFR: 42.84 mL/min — ABNORMAL LOW (ref >60.00)
Glucose, Bld: 175 mg/dL — ABNORMAL HIGH (ref 70–99)
POTASSIUM: 4.2 meq/L (ref 3.5–5.1)
SODIUM: 140 meq/L (ref 135–145)

## 2015-04-18 DIAGNOSIS — N400 Enlarged prostate without lower urinary tract symptoms: Secondary | ICD-10-CM | POA: Diagnosis not present

## 2015-04-22 DIAGNOSIS — N400 Enlarged prostate without lower urinary tract symptoms: Secondary | ICD-10-CM | POA: Diagnosis not present

## 2015-04-22 DIAGNOSIS — Z0000 Encounter for general adult medical examination without abnormal findings: Secondary | ICD-10-CM | POA: Diagnosis not present

## 2015-04-25 ENCOUNTER — Other Ambulatory Visit: Payer: Self-pay | Admitting: Family Medicine

## 2015-05-24 ENCOUNTER — Other Ambulatory Visit: Payer: Self-pay | Admitting: Family Medicine

## 2015-05-25 NOTE — Telephone Encounter (Signed)
Yes thanks 

## 2015-05-25 NOTE — Telephone Encounter (Signed)
Refill ok? 

## 2015-06-28 ENCOUNTER — Encounter: Payer: Self-pay | Admitting: Family Medicine

## 2015-06-28 ENCOUNTER — Ambulatory Visit (INDEPENDENT_AMBULATORY_CARE_PROVIDER_SITE_OTHER): Payer: PPO | Admitting: Family Medicine

## 2015-06-28 VITALS — BP 132/78 | HR 65 | Temp 98.0°F | Wt 194.0 lb

## 2015-06-28 DIAGNOSIS — I10 Essential (primary) hypertension: Secondary | ICD-10-CM | POA: Diagnosis not present

## 2015-06-28 DIAGNOSIS — N183 Chronic kidney disease, stage 3 unspecified: Secondary | ICD-10-CM

## 2015-06-28 DIAGNOSIS — I25119 Atherosclerotic heart disease of native coronary artery with unspecified angina pectoris: Secondary | ICD-10-CM

## 2015-06-28 DIAGNOSIS — E1122 Type 2 diabetes mellitus with diabetic chronic kidney disease: Secondary | ICD-10-CM | POA: Diagnosis not present

## 2015-06-28 LAB — CBC
HCT: 37.1 % — ABNORMAL LOW (ref 39.0–52.0)
Hemoglobin: 12.3 g/dL — ABNORMAL LOW (ref 13.0–17.0)
MCHC: 33 g/dL (ref 30.0–36.0)
MCV: 86 fl (ref 78.0–100.0)
Platelets: 138 10*3/uL — ABNORMAL LOW (ref 150.0–400.0)
RBC: 4.32 Mil/uL (ref 4.22–5.81)
RDW: 15.3 % (ref 11.5–15.5)
WBC: 6.7 10*3/uL (ref 4.0–10.5)

## 2015-06-28 LAB — HEMOGLOBIN A1C: HEMOGLOBIN A1C: 7.4 % — AB (ref 4.6–6.5)

## 2015-06-28 LAB — COMPREHENSIVE METABOLIC PANEL
ALK PHOS: 56 U/L (ref 39–117)
ALT: 11 U/L (ref 0–53)
AST: 13 U/L (ref 0–37)
Albumin: 4.4 g/dL (ref 3.5–5.2)
BILIRUBIN TOTAL: 0.8 mg/dL (ref 0.2–1.2)
BUN: 29 mg/dL — AB (ref 6–23)
CO2: 29 meq/L (ref 19–32)
CREATININE: 1.91 mg/dL — AB (ref 0.40–1.50)
Calcium: 9.7 mg/dL (ref 8.4–10.5)
Chloride: 102 mEq/L (ref 96–112)
GFR: 36.16 mL/min — AB (ref >60.00)
GLUCOSE: 165 mg/dL — AB (ref 70–99)
Potassium: 4.3 mEq/L (ref 3.5–5.1)
Sodium: 138 mEq/L (ref 135–145)
TOTAL PROTEIN: 6.6 g/dL (ref 6.0–8.3)

## 2015-06-28 NOTE — Assessment & Plan Note (Signed)
S: GFR in 30 to 40 range most recently A/P: consider ace-i given diabetes but microalbumin/creatinine ratio not elevated and patient already on multiple medicatoins so will not adjust for now, check GFR

## 2015-06-28 NOTE — Progress Notes (Signed)
Subjective:  Roy [name] is a 82 y.o. year old very pleasant male patient who presents for/with See problem oriented charting ROS- has some scaly spots with red base on face but scheduled dermatology follow up. No chest pain or shortness of breath. No headache or blurry vision.   Past Medical History-  Patient Active Problem List   Diagnosis Date Noted  . Type II diabetes mellitus with renal manifestations ([name]) 09/11/2006    Priority: High  . Coronary artery disease with angina pectoris ([name]) 09/11/2006    Priority: High  . Thrombocytopenia ([name]) 02/23/2015    Priority: Medium  . Mild persistent asthma 10/30/2010    Priority: Medium  . CKD (chronic kidney disease), stage III 02/22/2009    Priority: Medium  . Hyperlipidemia 09/11/2006    Priority: Medium  . Essential hypertension 09/11/2006    Priority: Medium  . Back pain 09/11/2006    Priority: Medium  . Sleep apnea 09/11/2006    Priority: Medium  . GERD (gastroesophageal reflux disease) 05/03/2014    Priority: Low  . Insomnia 05/03/2014    Priority: Low  . Allergic rhinitis 04/21/2014    Priority: Low  . Giardia 04/21/2014    Priority: Low  . NEPHROLITHIASIS, RECURRENT 01/21/2009    Priority: Low  . ACTINIC KERATOSIS, HEAD 06/14/2008    Priority: Low    Medications- reviewed and updated Current Outpatient Prescriptions  Medication Sig Dispense Refill  . ACCU-CHEK AVIVA PLUS test strip 1 each by Other route daily.    [name] aspirin EC 81 MG tablet Take 1 tablet (81 mg total) by mouth daily.    . ASSURE COMFORT LANCETS 30G MISC 1 each by Other route daily.    [name] atorvastatin (LIPITOR) 10 MG tablet TAKE 1 TABLET BY MOUTH ONCE A DAY 90 tablet 3  . betamethasone dipropionate (DIPROLENE) 0.05 % ointment APPLY TO THE OUTSIDE OF EAR DAILY FOR ONE WEEK MAXIMUM 30 g 1  . budesonide-formoterol (SYMBICORT) 160-4.5 MCG/ACT inhaler Inhale 2 puffs into the lungs 2 (two) times daily. 1 Inhaler 12  . calcium-vitamin D (OSCAL WITH D)  500-200 MG-UNIT per tablet Take 1 tablet by mouth daily.    [name] glimepiride (AMARYL) 4 MG tablet Take 1.5 tablets daily. 90 tablet 3  . isosorbide mononitrate (IMDUR) 30 MG 24 hr tablet TAKE THREE TABLETS BY MOUTH ONCE DAILY 180 tablet 3  . lisinopril (PRINIVIL,ZESTRIL) 5 MG tablet Take 0.5 tablets (2.5 mg total) by mouth daily. 30 tablet 5  . metoprolol succinate (TOPROL-XL) 50 MG 24 hr tablet TAKE ONE TABLET BY MOUTH ONCE DAILY. TAKE WITH OR IMMEDIATELY FOLLOWING A MEAL 90 tablet 0  . Omega-3 Fatty Acids (FISH OIL PO) Take 2 capsules by mouth 2 (two) times daily.    Roy [name] Glycol-Propyl Glycol (SYSTANE OP) Place 1 drop into both eyes 4 (four) times daily. Itchy/dry eye    . potassium citrate (UROCIT-K) 10 MEQ (1080 MG) SR tablet Take 10 mEq by mouth Twice daily.    . ranitidine (ZANTAC) 300 MG tablet TAKE 1 TABLET BY MOUTH TWICE A DAY 180 tablet 3  . ranolazine (RANEXA) 500 MG 12 hr tablet Take 1 tablet (500 mg total) by mouth 2 (two) times daily. 60 tablet 11  . Spacer/Aero-Holding Chambers (AEROCHAMBER MV) inhaler Use as instructed 1 each 0  . traZODone (DESYREL) 150 MG tablet TAKE ONE TABLET BY MOUTH ONCE DAILY 90 tablet 2  . zinc sulfate 220 MG capsule Take 220 mg by mouth daily.    [name]  acetaminophen (TYLENOL) 325 MG tablet Take 650 mg by mouth every evening. Reported on 06/28/2015    . albuterol (PROVENTIL HFA;VENTOLIN HFA) 108 (90 BASE) MCG/ACT inhaler Inhale 2 puffs into the lungs every 4 (four) hours as needed for wheezing or shortness of breath. (Patient not taking: Reported on 02/23/2015) 1 Inhaler 10  . HYDROcodone-acetaminophen (NORCO/VICODIN) 5-325 MG tablet Take 1-2 tablets by mouth every 6 (six) hours as needed. (Patient not taking: Reported on 06/28/2015) 10 tablet 0  . nitroGLYCERIN (NITROSTAT) 0.4 MG SL tablet Place 1 tablet (0.4 mg total) under the tongue every 5 (five) minutes as needed. For chest pain (Patient not taking: Reported on 06/28/2015) 25 tablet 6  . ondansetron  (ZOFRAN ODT) 4 MG disintegrating tablet Take 1 tablet (4 mg total) by mouth every 8 (eight) hours as needed for nausea or vomiting. (Patient not taking: Reported on 02/23/2015) 20 tablet 0  . traMADol (ULTRAM) 50 MG tablet TAKE ONE TABLET BY MOUTH EVERY 6 HOURS AS NEEDED FOR PAIN WITH TYLENOL (Patient not taking: Reported on 06/28/2015) 90 tablet 0   No current facility-administered medications for this visit.    Objective: BP 132/78 mmHg  Pulse 65  Temp(Src) 98 F (36.7 C)  Wt 194 lb (87.998 kg) Gen: NAD, resting comfortably, appears stated age CV: RRR no murmurs rubs or gallops Lungs: CTAB no crackles, wheeze, rhonchi Abdomen: soft/nontender/nondistended/normal bowel sounds. No rebound or guarding.  Ext: no edema Skin: warm, dry Neuro: grossly normal, moves all extremities  Assessment/Plan:  Type II diabetes mellitus with renal manifestations ([name]) S: improved controlled. On amaryl 6mg  with no low blood sugars (up from 4mg ) CBGs- mostly from 108-140 or 150 in AM Exercise and diet- working on increasing activity  Lab Results  Component Value Date   HGBA1C 8.0* 02/23/2015   HGBA1C 7.1* 05/03/2014   HGBA1C 7.0* 10/21/2013   A/P: update a1c today, hopeful improved. GFR in 30-40 range if remains there would consider metformin 250mg  once daily vs. Increasing to amaryl 8mg    CKD (chronic kidney disease), stage III S: GFR in 30 to 40 range most recently A/P: consider ace-i given diabetes but microalbumin/creatinine ratio not elevated and patient already on multiple medicatoins so will not adjust for now, check GFR  Coronary artery disease with angina pectoris ([name]) S: continues to follow with cardiology Dr. Aundra Dubin. Has nonobstructive CAD per cath 2013. Needs continued ranexa and imdur to control angina. Also on metoprolol and aspirin as well as atorvastatin A/P: no change in medication  Essential hypertension S: controlled on imdur 30mg  and metoprolol 50mg  XL BP Readings from  Last 3 Encounters:  06/28/15 140/70  02/23/15 126/60  02/17/15 120/60  A/P:Continue current meds:  No change, check cbc as also has anemia and thrombocytopenia but mild   Return in about 3 months (around 10/03/2015) for or later for follow up. Return precautions advised.   Orders Placed This Encounter  Procedures  . Hemoglobin A1c    [place]  . Comprehensive metabolic panel    [place]  . CBC    [place]   [name], MD

## 2015-06-28 NOTE — Patient Instructions (Signed)
Get labs before you leave. No changes in medication unless labs lead [place] to those changes. I am glad you are doing well!

## 2015-06-28 NOTE — Assessment & Plan Note (Signed)
S: continues to follow with cardiology Dr. Aundra Dubin. Has nonobstructive CAD per cath 2013. Needs continued ranexa and imdur to control angina. Also on metoprolol and aspirin as well as atorvastatin A/P: no change in medication

## 2015-06-28 NOTE — Assessment & Plan Note (Signed)
S: improved controlled. On amaryl 6mg  with no low blood sugars (up from 4mg ) CBGs- mostly from 108-140 or 150 in AM Exercise and diet- working on increasing activity  Lab Results  Component Value Date   HGBA1C 8.0* 02/23/2015   HGBA1C 7.1* 05/03/2014   HGBA1C 7.0* 10/21/2013   A/P: update a1c today, hopeful improved. GFR in 30-40 range if remains there would consider metformin 250mg  once daily vs. Increasing to amaryl 8mg 

## 2015-06-28 NOTE — Assessment & Plan Note (Signed)
S: controlled on imdur 30mg  and metoprolol 50mg  XL BP Readings from Last 3 Encounters:  06/28/15 140/70  02/23/15 126/60  02/17/15 120/60  A/P:Continue current meds:  No change, check cbc as also has anemia and thrombocytopenia but mild

## 2015-06-29 ENCOUNTER — Other Ambulatory Visit: Payer: Self-pay

## 2015-06-29 ENCOUNTER — Telehealth: Payer: Self-pay | Admitting: Family Medicine

## 2015-06-29 MED ORDER — METFORMIN HCL 500 MG PO TABS: 250.0000 mg | ORAL_TABLET | Freq: Every day | ORAL | Status: DC

## 2015-06-29 NOTE — Telephone Encounter (Signed)
Pt returning your call Advised Dr gone for the day and will the next day.

## 2015-06-30 NOTE — Telephone Encounter (Signed)
Returned pt call, reults given to pt wife as she is on [place].

## 2015-07-09 ENCOUNTER — Other Ambulatory Visit: Payer: Self-pay | Admitting: Family Medicine

## 2015-07-24 ENCOUNTER — Encounter (HOSPITAL_COMMUNITY): Payer: Self-pay | Admitting: Emergency Medicine

## 2015-07-24 ENCOUNTER — Ambulatory Visit (HOSPITAL_COMMUNITY)
Admission: EM | Admit: 2015-07-24 | Discharge: 2015-07-24 | Disposition: A | Discharge status: Home / Self Care | Payer: PPO | Attending: Family Medicine | Admitting: Family Medicine

## 2015-07-24 DIAGNOSIS — K140 Glossitis: Secondary | ICD-10-CM | POA: Diagnosis not present

## 2015-07-24 MED ORDER — CLINDAMYCIN HCL 300 MG PO CAPS: 300.0000 mg | ORAL_CAPSULE | Freq: Two times a day (BID) | ORAL | Status: DC

## 2015-07-24 NOTE — Discharge Instructions (Signed)
It was nice seeing you today. I am sorry about your tongue injury. Due to hx of DM2, I will like to give you some antibiotic coverage. Please keep mouth clean with good hygiene. Use tylenol as needed for pain  Tongue Laceration A tongue laceration is a cut on the tongue. Most cuts on the tongue heal without any problems. Minor cuts usually do not need stitches (sutures). A more serious cut may need stitches if:  It goes all the way through the tongue.  It is on the side of the tongue. Very bad cuts may be treated with medicine to prevent infection (antibiotic medicine). HOME CARE Pay attention to any changes in your cut. Take these actions to help your tongue to heal.  Take over-the-counter and prescription medicines only as told by your doctor.  If you were prescribed an antibiotic medicine, take it as told by your doctor. Do not stop taking it even if you start to feel better.  If you were told to use a germ-killing (antiseptic) mouth rinse, use it exactly as told by your doctor.  If your cut was closed with stitches, do not pull on them. Leave the stitches in place for as long as your doctor tells you to. Follow instructions from your doctor about:  How to care for your stitches.  When and how your stitches will need to be taken out.  Rinse your mouth with water after each time that you eat.  If directed, apply ice to your cut.  Cover an ice cube with a thin cloth.  Hold the covered ice cube on the cut for 1-3 minutes, if you can. Do this 4 times per day, for 1-2 days.  After one day, use salt-water or other mouth rinses 4-6 times per day as told by your doctor. To make a salt-water mixture, completely dissolve -1 tsp of salt in 1 cup of warm water. Your doctor may tell you to use a mouth rinse that kills germs.  If you did not injure your teeth, gently brush and floss them as usual.  Do not brush or floss loose or broken teeth.  Do not brush or floss teeth that have been  put back in the right place by your doctor.  Follow instructions from your doctor about what you cannot eat or drink. Do not eat hard or chewy foods until your cut has healed.  Do not eat hot food or have any hot drinks while your mouth is numb.  Keep all follow-up visits as told by your doctor. This is important. GET HELP IF:  You have a fever.  You have pus coming from your cut.  A cut that was closed breaks open. GET HELP RIGHT AWAY IF:  You have bleeding that does not stop when you put pressure on it.  You have trouble breathing.  You have worse swelling or pain in your cut.  You have worse swelling or pain in other parts of your mouth, neck, or face.   This information is not intended to replace advice given to you by your health care provider. Make sure you discuss any questions you have with your health care provider.   Document Released: 05/14/2011 Document Revised: 11/10/2014 Document Reviewed: 04/28/2014 Elsevier Interactive Patient Education [employer].

## 2015-07-24 NOTE — ED Provider Notes (Signed)
CSN: [id number]     Arrival date & time 07/24/15  1526 History   First MD Initiated Contact with Patient 07/24/15 1707     No chief complaint on file.  (Consider location/radiation/quality/duration/timing/severity/associated sxs/prior Treatment) Patient is a 82 y.o. male presenting with mouth sores. The history is provided by the patient. No language interpreter was used.  Mouth Lesions Location:  Tongue (Patient c/o cut on his tongue which he noticed few hours after he went out to lunch with his wife at [place]. After getting back home to feed his dog he noticed blood in his mouth) Quality:  Bleeding and painful (small cut) Pain details:    Quality:  Sore   Severity:  Mild   Duration:  3 hours   Timing:  Intermittent Onset quality:  Sudden Duration:  3 hours Progression:  Unchanged Chronicity:  New Context: trauma   Context: not a change in diet, not a change in medications and not a possible infection   Context comment:  He ate burger. Denies eating any bony food or fish. He can not remember if he bit himself Relieved by:  Nothing Worsened by:  Eating Ineffective treatments:  None tried Associated symptoms: no dental pain, no ear pain, no fever, no sore throat and no swollen glands     Past Medical History  Diagnosis Date  . CAD (coronary artery disease)   . Hyperlipidemia   . Hypertension   . Myocardial infarction ([place])   . Anginal pain ([place])   . Shortness of breath   . Lyme disease     states had shakes that were thought to be parkinson's related but related to tick bite, states also RMSF  . Sleep apnea     uses cpap  . Diabetes mellitus, type 2 ([place])   . Heart murmur   . Fever 12/26/2011  . Chronic kidney disease     renal insufficiency  . GERD (gastroesophageal reflux disease)   . Arthritis     spine  . Chest pain on exertion 02/2012  . CAP (community acquired pneumonia) 12/26/2011  . Asthma    Past Surgical History  Procedure Laterality Date  . Cervical  laminectomy    . Cholecystectomy    . Appendectomy    . Kidney stone surgery    . Left heart cath N/A 02/15/2012    Procedure: LEFT HEART CATH;  Surgeon: Larey Dresser, MD;  Location: [facility] CATH LAB;  Service: Cardiovascular;  Laterality: N/A;   Family History  Problem Relation Age of Onset  . ALS Father   . Asthma Daughter   . Cancer Sister     breast/liver   Social History  Substance Use Topics  . Smoking status: Former Smoker -- 1.50 packs/day for 8 years    Types: Cigarettes    Quit date: 03/05/1958  . Smokeless tobacco: Never Used  . Alcohol Use: No    Review of Systems  Constitutional: Negative for fever.  HENT: Positive for mouth sores. Negative for ear pain and sore throat.        Tongue trauma  Respiratory: Negative.   Cardiovascular: Negative.   All other systems reviewed and are negative.   Allergies  Codeine sulfate and Cephalexin  Home Medications   Prior to Admission medications   Medication Sig Start Date End Date Taking? Authorizing Provider  ACCU-CHEK AVIVA PLUS test strip 1 each by Other route daily. 12/02/13   Historical Provider, MD  acetaminophen (TYLENOL) 325 MG tablet Take 650 mg by  mouth every evening. Reported on 06/28/2015    Historical Provider, MD  albuterol (PROVENTIL HFA;VENTOLIN HFA) 108 (90 BASE) MCG/ACT inhaler Inhale 2 puffs into the lungs every 4 (four) hours as needed for wheezing or shortness of breath. Patient not taking: Reported on 02/23/2015 10/21/13   Zenaida Niece, PA-C  aspirin EC 81 MG tablet Take 1 tablet (81 mg total) by mouth daily. 03/11/12   Larey Dresser, MD  ASSURE COMFORT LANCETS 30G MISC 1 each by Other route daily. 12/02/13   Historical Provider, MD  atorvastatin (LIPITOR) 10 MG tablet TAKE 1 TABLET BY MOUTH ONCE A DAY 02/23/15   Marin Olp, MD  betamethasone dipropionate (DIPROLENE) 0.05 % ointment APPLY TO THE OUTSIDE OF EAR DAILY FOR ONE WEEK MAXIMUM 04/26/15   Marin Olp, MD  budesonide-formoterol  [facility]) 160-4.5 MCG/ACT inhaler Inhale 2 puffs into the lungs 2 (two) times daily. 08/31/14   Marin Olp, MD  calcium-vitamin D (OSCAL WITH D) 500-200 MG-UNIT per tablet Take 1 tablet by mouth daily.    Historical Provider, MD  glimepiride (AMARYL) 4 MG tablet Take 1.5 tablets daily. 02/24/15   Marin Olp, MD  HYDROcodone-acetaminophen (NORCO/VICODIN) 5-325 MG tablet Take 1-2 tablets by mouth every 6 (six) hours as needed. Patient not taking: Reported on 06/28/2015 02/09/15   Merryl Hacker, MD  isosorbide mononitrate (IMDUR) 30 MG 24 hr tablet TAKE THREE TABLETS BY MOUTH ONCE DAILY 02/23/15   Marin Olp, MD  lisinopril (PRINIVIL,ZESTRIL) 5 MG tablet Take 0.5 tablets (2.5 mg total) by mouth daily. 02/24/15   Marin Olp, MD  metFORMIN (GLUCOPHAGE) 500 MG tablet Take 0.5 tablets (250 mg total) by mouth daily with breakfast. 06/29/15   Marin Olp, MD  metoprolol succinate (TOPROL-XL) 50 MG 24 hr tablet TAKE ONE TABLET BY MOUTH ONCE DAILY. TAKE WITH OR IMMEDIATELY FOLLOWING A MEAL 07/11/15   Marin Olp, MD  nitroGLYCERIN (NITROSTAT) 0.4 MG SL tablet Place 1 tablet (0.4 mg total) under the tongue every 5 (five) minutes as needed. For chest pain Patient not taking: Reported on 06/28/2015 06/14/14   Larey Dresser, MD  Omega-3 Fatty Acids (FISH OIL PO) Take 2 capsules by mouth 2 (two) times daily.    Historical Provider, MD  ondansetron (ZOFRAN ODT) 4 MG disintegrating tablet Take 1 tablet (4 mg total) by mouth every 8 (eight) hours as needed for nausea or vomiting. Patient not taking: Reported on 02/23/2015 02/09/15   Merryl Hacker, MD  Polyethyl Glycol-Propyl Glycol (SYSTANE OP) Place 1 drop into both eyes 4 (four) times daily. Itchy/dry eye    Historical Provider, MD  potassium citrate (UROCIT-K) 10 MEQ (1080 MG) SR tablet Take 10 mEq by mouth Twice daily. 01/29/12   Historical Provider, MD  ranitidine (ZANTAC) 300 MG tablet TAKE 1 TABLET BY MOUTH TWICE A DAY 08/10/14    Marin Olp, MD  ranolazine (RANEXA) 500 MG 12 hr tablet Take 1 tablet (500 mg total) by mouth 2 (two) times daily. 02/17/15   Larey Dresser, MD  Spacer/Aero-Holding Chambers (AEROCHAMBER MV) inhaler Use as instructed 07/13/14   Juanito Doom, MD  traMADol (ULTRAM) 50 MG tablet TAKE ONE TABLET BY MOUTH EVERY 6 HOURS AS NEEDED FOR PAIN WITH TYLENOL Patient not taking: Reported on 06/28/2015 05/25/15   Marin Olp, MD  traZODone (DESYREL) 150 MG tablet TAKE ONE TABLET BY MOUTH ONCE DAILY 01/31/15   Marin Olp, MD  zinc sulfate 220 MG  capsule Take 220 mg by mouth daily.    Historical Provider, MD   Meds Ordered and Administered this Visit  Medications - No data to display  BP 159/86 mmHg  Pulse 65  Temp(Src) 97.9 F (36.6 C) (Oral)  Resp 20  SpO2 99% No data found.   Physical Exam  Constitutional: He appears well-developed. No distress.  HENT:  Head: Normocephalic and atraumatic.  Mouth/Throat: Uvula is midline and oropharynx is clear and moist. No oral lesions. Lacerations present.    Cardiovascular: Normal rate, regular rhythm and normal heart sounds.   No murmur heard. Pulmonary/Chest: Effort normal and breath sounds normal. No respiratory distress. He has no wheezes.  Nursing note and vitals reviewed.   ED Course  Procedures (including critical care time)  Labs Review Labs Reviewed - No data to display  Imaging Review No results found.   Visual Acuity Review  Right Eye Distance:   Left Eye Distance:   Bilateral Distance:    Right Eye Near:   Left Eye Near:    Bilateral Near:         MDM  No diagnosis found. Ulceration, tongue traumatic  Likely tongue bite unknowingly given his DM. No FB palpable or seen on exam. Clean water mouth rise recommended and good oral hygiene. Prophylactic A/B coverage given given hx of DM. Return precaution discussed. Patient agreed with plan.    Kinnie Feil, MD 07/24/15 1730

## 2015-07-24 NOTE — ED Notes (Signed)
PT has a swollen lesion on the top of his tongue. PT reports he ate lunch today, went home and was moving around and suddenly his mouth filled with blood. Pt did not notice lesion at all prior to that. PT takes 81 mg of ASA a day and is diabetic. PT is unaware of any injury.

## 2015-07-28 ENCOUNTER — Telehealth: Payer: Self-pay | Admitting: Pediatrics

## 2015-07-28 NOTE — Telephone Encounter (Signed)
Rec'd fax in regards to pt needing diabetic supplies.  Per Dr. Yong Channel I called to verify request and inquire to exactly shich supplies pt needs.  Left message with wife and called cell phone number. Cell# rang 3x and disconnected.

## 2015-07-29 ENCOUNTER — Other Ambulatory Visit: Payer: Self-pay | Admitting: Pediatrics

## 2015-07-29 MED ORDER — GLUCOSE BLOOD VI STRP: ORAL_STRIP | Status: DC

## 2015-07-29 NOTE — Telephone Encounter (Signed)
I spoke with patient today.  He states the only supplies he needs at this point are test strips.  I faxed order for those to pharmacy.

## 2015-07-29 NOTE — Telephone Encounter (Signed)
Left message with wife for patient to call back.

## 2015-07-29 NOTE — Telephone Encounter (Signed)
Response to faxed refill request. Sent to [facility].

## 2015-07-30 ENCOUNTER — Other Ambulatory Visit: Payer: Self-pay | Admitting: Family Medicine

## 2015-08-02 ENCOUNTER — Other Ambulatory Visit: Payer: Self-pay | Admitting: Pediatrics

## 2015-08-02 DIAGNOSIS — Z794 Long term (current) use of insulin: Principal | ICD-10-CM

## 2015-08-02 DIAGNOSIS — E118 Type 2 diabetes mellitus with unspecified complications: Secondary | ICD-10-CM

## 2015-08-02 MED ORDER — GLUCOSE BLOOD VI STRP: ORAL_STRIP | Status: DC

## 2015-08-02 NOTE — Telephone Encounter (Signed)
Roy [name] faxed a request to change glucose testing supplies to One Touch, Free Style, or Precision products.  I spoke to [name]) she states patient has Accu-check Aviva Plus meter, as well as, One Touch Ultra.  Rx for One Touch Ultra will be sent to [facility], [facility], [place]. Wife notified that Rx for accu-check sent to mail order pharmacy last week per pts request. She voiced understanding.

## 2015-08-02 NOTE — Telephone Encounter (Signed)
[place] sent rx back to clarify testing instructions. QD testing. Faxed back to [phone]

## 2015-08-03 ENCOUNTER — Telehealth: Payer: Self-pay | Admitting: *Deleted

## 2015-08-03 NOTE — Telephone Encounter (Signed)
Spoke with pt regarding test strips. He states to send prescription to [facility]. He states he test once a day and occasionally twice a day.  Form completed for patients test strips. Fax to [facility].

## 2015-08-04 ENCOUNTER — Other Ambulatory Visit: Payer: Self-pay | Admitting: Family Medicine

## 2015-08-04 MED ORDER — RANITIDINE HCL 300 MG PO TABS: ORAL_TABLET | ORAL | Status: DC

## 2015-09-07 ENCOUNTER — Other Ambulatory Visit: Payer: Self-pay | Admitting: Family Medicine

## 2015-09-08 NOTE — Telephone Encounter (Signed)
Yes thanks 

## 2015-09-27 ENCOUNTER — Encounter: Payer: Self-pay | Admitting: Family Medicine

## 2015-09-27 ENCOUNTER — Ambulatory Visit (INDEPENDENT_AMBULATORY_CARE_PROVIDER_SITE_OTHER): Payer: PPO | Admitting: Family Medicine

## 2015-09-27 VITALS — BP 120/66 | HR 61 | Temp 97.8°F | Wt 191.8 lb

## 2015-09-27 DIAGNOSIS — J449 Chronic obstructive pulmonary disease, unspecified: Secondary | ICD-10-CM

## 2015-09-27 DIAGNOSIS — N183 Chronic kidney disease, stage 3 unspecified: Secondary | ICD-10-CM

## 2015-09-27 DIAGNOSIS — E1122 Type 2 diabetes mellitus with diabetic chronic kidney disease: Secondary | ICD-10-CM

## 2015-09-27 DIAGNOSIS — I25119 Atherosclerotic heart disease of native coronary artery with unspecified angina pectoris: Secondary | ICD-10-CM | POA: Diagnosis not present

## 2015-09-27 DIAGNOSIS — I10 Essential (primary) hypertension: Secondary | ICD-10-CM

## 2015-09-27 DIAGNOSIS — D696 Thrombocytopenia, unspecified: Secondary | ICD-10-CM | POA: Diagnosis not present

## 2015-09-27 LAB — CBC
HEMATOCRIT: 36.5 % — AB (ref 39.0–52.0)
HEMOGLOBIN: 12.2 g/dL — AB (ref 13.0–17.0)
MCHC: 33.4 g/dL (ref 30.0–36.0)
MCV: 86.9 fl (ref 78.0–100.0)
Platelets: 123 10*3/uL — ABNORMAL LOW (ref 150.0–400.0)
RBC: 4.2 Mil/uL — ABNORMAL LOW (ref 4.22–5.81)
RDW: 14.8 % (ref 11.5–15.5)
WBC: 7.2 10*3/uL (ref 4.0–10.5)

## 2015-09-27 LAB — COMPREHENSIVE METABOLIC PANEL
ALK PHOS: 55 U/L (ref 39–117)
ALT: 14 U/L (ref 0–53)
AST: 13 U/L (ref 0–37)
Albumin: 4.2 g/dL (ref 3.5–5.2)
BUN: 29 mg/dL — ABNORMAL HIGH (ref 6–23)
CO2: 30 meq/L (ref 19–32)
Calcium: 9.6 mg/dL (ref 8.4–10.5)
Chloride: 103 mEq/L (ref 96–112)
Creatinine, Ser: 1.96 mg/dL — ABNORMAL HIGH (ref 0.40–1.50)
GFR: 35.07 mL/min — AB (ref >60.00)
GLUCOSE: 144 mg/dL — AB (ref 70–99)
POTASSIUM: 4.8 meq/L (ref 3.5–5.1)
Sodium: 139 mEq/L (ref 135–145)
TOTAL PROTEIN: 6.5 g/dL (ref 6.0–8.3)
Total Bilirubin: 0.6 mg/dL (ref 0.2–1.2)

## 2015-09-27 LAB — HEMOGLOBIN A1C: Hgb A1c MFr Bld: 7.2 % — ABNORMAL HIGH (ref 4.6–6.5)

## 2015-09-27 LAB — LDL CHOLESTEROL, DIRECT: Direct LDL: 45 mg/dL

## 2015-09-27 MED ORDER — TETANUS-DIPHTH-ACELL PERTUSSIS 5-2.5-18.5 LF-MCG/0.5 IM SUSP: 0.5000 mL | Freq: Once | INTRAMUSCULAR | 0 refills | Status: AC

## 2015-09-27 MED ORDER — ALBUTEROL SULFATE HFA 108 (90 BASE) MCG/ACT IN AERS: 2.0000 | INHALATION_SPRAY | RESPIRATORY_TRACT | 10 refills | Status: DC | PRN

## 2015-09-27 NOTE — Progress Notes (Signed)
Pre visit review using our clinic review tool, if applicable. No additional management support is needed unless otherwise documented below in the visit note. 

## 2015-09-27 NOTE — Assessment & Plan Note (Signed)
S: controlled. On imdur 90mg , lisinopril 5mg ,  and metoprolol 50mg  XL BP Readings from Last 3 Encounters:  09/27/15 120/66  07/24/15 159/86  06/28/15 132/78  A/P:Continue current meds:  Well controlled now

## 2015-09-27 NOTE — Patient Instructions (Signed)
Things look great. HOpeful a1c is under 7, as long as under 7.5 unlikely to make any changes  No med changes today  Labs before you leave

## 2015-09-27 NOTE — Assessment & Plan Note (Signed)
S: Patient doing well on metoprolol, imdur, ranexa. States since on ranexa has not had to use nitroglycerin. Also compliant with statin and aspirin A/P: continue current medications

## 2015-09-27 NOTE — Assessment & Plan Note (Signed)
S: platelets slightly low at 138k- stable in high of the low range A/P: update today

## 2015-09-27 NOTE — Assessment & Plan Note (Signed)
S: improved controlled. On amaryl 6mg  last visit- metformin 250mg  once daily added with GFR in 30-40 range.  CBGs- 80 to 130 in am Lab Results  Component Value Date   HGBA1C 7.4 (H) 06/28/2015   HGBA1C 8.0 (H) 02/23/2015   HGBA1C 7.1 (H) 05/03/2014   A/P: as long as under 7.5 likely not to change regimen but hopeful under 7.

## 2015-09-27 NOTE — Assessment & Plan Note (Signed)
S: GFR remains in 30-40 range. On low dose ace-I at lisinopril 5mg  A/P: update bmet today

## 2015-09-27 NOTE — Progress Notes (Signed)
Subjective:  Roy [name] is a 82 y.o. year old very pleasant male patient who presents for/with See problem oriented charting ROS- No chest pain or shortness of breath (except if wheezing and improves with albuterol- very rare). No headache or blurry vision.  .see any ROS included in HPI as well.   Past Medical History-  Patient Active Problem List   Diagnosis Date Noted  . Type II diabetes mellitus with renal manifestations ([place]) 09/11/2006    Priority: High  . Coronary artery disease with angina pectoris ([place]) 09/11/2006    Priority: High  . Thrombocytopenia ([place]) 02/23/2015    Priority: Medium  . Mild persistent asthma 10/30/2010    Priority: Medium  . CKD (chronic kidney disease), stage III 02/22/2009    Priority: Medium  . Hyperlipidemia 09/11/2006    Priority: Medium  . Essential hypertension 09/11/2006    Priority: Medium  . Back pain 09/11/2006    Priority: Medium  . Sleep apnea 09/11/2006    Priority: Medium  . GERD (gastroesophageal reflux disease) 05/03/2014    Priority: Low  . Insomnia 05/03/2014    Priority: Low  . Allergic rhinitis 04/21/2014    Priority: Low  . Giardia 04/21/2014    Priority: Low  . NEPHROLITHIASIS, RECURRENT 01/21/2009    Priority: Low  . ACTINIC KERATOSIS, HEAD 06/14/2008    Priority: Low    Medications- reviewed and updated Current Outpatient Prescriptions  Medication Sig Dispense Refill  . acetaminophen (TYLENOL) 325 MG tablet Take 650 mg by mouth every evening. Reported on 06/28/2015    . albuterol (PROVENTIL HFA;VENTOLIN HFA) 108 (90 BASE) MCG/ACT inhaler Inhale 2 puffs into the lungs every 4 (four) hours as needed for wheezing or shortness of breath. 1 Inhaler 10  . aspirin EC 81 MG tablet Take 1 tablet (81 mg total) by mouth daily.    . ASSURE COMFORT LANCETS 30G MISC 1 each by Other route daily.    [name] atorvastatin (LIPITOR) 10 MG tablet TAKE 1 TABLET BY MOUTH ONCE A DAY 90 tablet 3  . betamethasone dipropionate (DIPROLENE) 0.05  % ointment APPLY TO THE OUTSIDE OF EAR DAILY FOR ONE WEEK MAXIMUM 30 g 1  . budesonide-formoterol (SYMBICORT) 160-4.5 MCG/ACT inhaler Inhale 2 puffs into the lungs 2 (two) times daily. 1 Inhaler 12  . calcium-vitamin D (OSCAL WITH D) 500-200 MG-UNIT per tablet Take 1 tablet by mouth daily.    . clindamycin (CLEOCIN) 300 MG capsule Take 1 capsule (300 mg total) by mouth 2 (two) times daily. 20 capsule 0  . glimepiride (AMARYL) 4 MG tablet Take 1.5 tablets daily. 90 tablet 3  . glucose blood (ACCU-CHEK AVIVA PLUS) test strip Test blood glucose daily. 100 each 12  . glucose blood test strip One Touch Ultra Test Strips 100 each 12  . HYDROcodone-acetaminophen (NORCO/VICODIN) 5-325 MG tablet Take 1-2 tablets by mouth every 6 (six) hours as needed. 10 tablet 0  . isosorbide mononitrate (IMDUR) 30 MG 24 hr tablet TAKE THREE TABLETS BY MOUTH ONCE DAILY 180 tablet 3  . lisinopril (PRINIVIL,ZESTRIL) 5 MG tablet Take 0.5 tablets (2.5 mg total) by mouth daily. 30 tablet 5  . metFORMIN (GLUCOPHAGE) 500 MG tablet Take 0.5 tablets (250 mg total) by mouth daily with breakfast. 90 tablet 3  . metoprolol succinate (TOPROL-XL) 50 MG 24 hr tablet TAKE ONE TABLET BY MOUTH ONCE DAILY. TAKE WITH OR IMMEDIATELY FOLLOWING A MEAL 90 tablet 2  . nitroGLYCERIN (NITROSTAT) 0.4 MG SL tablet Place 1 tablet (0.4 mg  total) under the tongue every 5 (five) minutes as needed. For chest pain 25 tablet 6  . Omega-3 Fatty Acids (FISH OIL PO) Take 2 capsules by mouth 2 (two) times daily.    . ondansetron (ZOFRAN ODT) 4 MG disintegrating tablet Take 1 tablet (4 mg total) by mouth every 8 (eight) hours as needed for nausea or vomiting. 20 tablet 0  . Polyethyl Glycol-Propyl Glycol (SYSTANE OP) Place 1 drop into both eyes 4 (four) times daily. Itchy/dry eye    . potassium citrate (UROCIT-K) 10 MEQ (1080 MG) SR tablet Take 10 mEq by mouth Twice daily.    . ranitidine (ZANTAC) 300 MG tablet TAKE 1 TABLET BY MOUTH TWICE A DAY 180 tablet 0  .  ranolazine (RANEXA) 500 MG 12 hr tablet Take 1 tablet (500 mg total) by mouth 2 (two) times daily. 60 tablet 11  . Spacer/Aero-Holding Chambers (AEROCHAMBER MV) inhaler Use as instructed 1 each 0  . traMADol (ULTRAM) 50 MG tablet TAKE ONE TABLET BY MOUTH EVERY 6 HOURS AS NEEDED FOR PAIN WITH  TYLENOL 90 tablet 0  . traZODone (DESYREL) 150 MG tablet TAKE ONE TABLET BY MOUTH ONCE DAILY 90 tablet 2  . zinc sulfate 220 MG capsule Take 220 mg by mouth daily.     No current facility-administered medications for this visit.     Objective: BP 120/66 (BP Location: Left Arm, Patient Position: Sitting, Cuff Size: Normal)   Pulse 61   Temp 97.8 F (36.6 C) (Oral)   Wt 191 lb 12.8 oz (87 kg)   SpO2 96%   BMI 27.52 kg/m  Gen: NAD, resting comfortably, appears stated age CV: RRR no murmurs rubs or gallops Lungs: CTAB no crackles, wheeze, rhonchi Abdomen: soft/nontender/nondistended/normal bowel sounds. No rebound or guarding.  Ext: no edema Skin: warm, dry, some bruising on arms noted Neuro: grossly normal, moves all extremities  Assessment/Plan:   Type II diabetes mellitus with renal manifestations ([place]) S: improved controlled. On amaryl 6mg  last visit- metformin 250mg  once daily added with GFR in 30-40 range.  CBGs- 80 to 130 in am Lab Results  Component Value Date   HGBA1C 7.4 (H) 06/28/2015   HGBA1C 8.0 (H) 02/23/2015   HGBA1C 7.1 (H) 05/03/2014   A/P: as long as under 7.5 likely not to change regimen but hopeful under 7.    Coronary artery disease with angina pectoris ([place]) S: Patient doing well on metoprolol, imdur, ranexa. States since on ranexa has not had to use nitroglycerin. Also compliant with statin and aspirin A/P: continue current medications   Thrombocytopenia ([facility]) S: platelets slightly low at 138k- stable in high of the low range A/P: update today   CKD (chronic kidney disease), stage III S: GFR remains in 30-40 range. On low dose ace-I at lisinopril 5mg  A/P:  update bmet today  Essential hypertension S: controlled. On imdur 90mg , lisinopril 5mg ,  and metoprolol 50mg  XL BP Readings from Last 3 Encounters:  09/27/15 120/66  07/24/15 159/86  06/28/15 132/78  A/P:Continue current meds:  Well controlled now   Return in about 14 weeks (around 01/03/2016).  Orders Placed This Encounter  Procedures  . CBC    [place]  . Comprehensive metabolic panel    [place]  . LDL cholesterol, direct    [place]  . Hemoglobin A1c    [place]   Take Tdap to pharmacy Meds ordered this encounter  Medications  . albuterol (PROVENTIL HFA;VENTOLIN HFA) 108 (90 Base) MCG/ACT inhaler    Sig: Inhale 2  puffs into the lungs every 4 (four) hours as needed for wheezing or shortness of breath.    Dispense:  1 Inhaler    Refill:  10  . Tdap (BOOSTRIX) 5-2.5-18.5 LF-MCG/0.5 injection    Sig: Inject 0.5 mLs into the muscle once.    Dispense:  0.5 mL    Refill:  0    Return precautions advised.  [name], MD

## 2015-09-30 ENCOUNTER — Other Ambulatory Visit: Payer: Self-pay | Admitting: Family Medicine

## 2015-09-30 ENCOUNTER — Ambulatory Visit (HOSPITAL_COMMUNITY)
Admission: EM | Admit: 2015-09-30 | Discharge: 2015-09-30 | Disposition: A | Discharge status: Home / Self Care | Payer: PPO | Attending: Emergency Medicine | Admitting: Emergency Medicine

## 2015-09-30 ENCOUNTER — Encounter (HOSPITAL_COMMUNITY): Payer: Self-pay | Admitting: Emergency Medicine

## 2015-09-30 ENCOUNTER — Ambulatory Visit (INDEPENDENT_AMBULATORY_CARE_PROVIDER_SITE_OTHER): Payer: PPO

## 2015-09-30 DIAGNOSIS — M5136 Other intervertebral disc degeneration, lumbar region: Secondary | ICD-10-CM

## 2015-09-30 DIAGNOSIS — M545 Low back pain, unspecified: Secondary | ICD-10-CM

## 2015-09-30 DIAGNOSIS — M51369 Other intervertebral disc degeneration, lumbar region without mention of lumbar back pain or lower extremity pain: Secondary | ICD-10-CM

## 2015-09-30 DIAGNOSIS — W19XXXA Unspecified fall, initial encounter: Secondary | ICD-10-CM

## 2015-09-30 DIAGNOSIS — S3992XA Unspecified injury of lower back, initial encounter: Secondary | ICD-10-CM | POA: Diagnosis not present

## 2015-09-30 MED ORDER — CYCLOBENZAPRINE HCL 5 MG PO TABS: ORAL_TABLET | ORAL | 0 refills | Status: DC

## 2015-09-30 MED ORDER — HYDROCODONE-ACETAMINOPHEN 5-325 MG PO TABS: 1.0000 | ORAL_TABLET | Freq: Four times a day (QID) | ORAL | 0 refills | Status: DC | PRN

## 2015-09-30 MED ORDER — HYDROCODONE-ACETAMINOPHEN 5-325 MG PO TABS
ORAL_TABLET | ORAL | Status: AC
  Filled 2015-09-30: qty 1

## 2015-09-30 MED ORDER — HYDROCODONE-ACETAMINOPHEN 5-325 MG PO TABS
1.0000 | ORAL_TABLET | Freq: Once | ORAL | Status: AC
  Administered 2015-09-30: 1 via ORAL

## 2015-09-30 NOTE — ED Triage Notes (Signed)
The patient presented to the [facility] with a complaint of lower -mid back pain that started today secondary to a fall from the standing position where the patient fell backwards and landed on his back.

## 2015-09-30 NOTE — ED Provider Notes (Signed)
CSN: [id number]     Arrival date & time 09/30/15  1727 History   First MD Initiated Contact with Patient 09/30/15 [place]     Chief Complaint  Patient presents with  . Fall  . Back Pain   (Consider location/radiation/quality/duration/timing/severity/associated sxs/prior Treatment) 82 yo male with a prior history of back surgery (>10 years) presents with lower back pain following a fall today. Patient was off of his lawnmower trying to turn it and fell back landing flat upon his back. He had some pain immediately but after finishing the lawn and working in the lawn his pain became more severe. He has mostly spinal pain. No leg pain, weakness or numbness. No loss of bowel or bladder control.       Past Medical History:  Diagnosis Date  . Anginal pain ([place])   . Arthritis    spine  . Asthma   . CAD (coronary artery disease)   . CAP (community acquired pneumonia) 12/26/2011  . Chest pain on exertion 02/2012  . Chronic kidney disease    renal insufficiency  . Diabetes mellitus, type 2 ([place])   . Fever 12/26/2011  . GERD (gastroesophageal reflux disease)   . Heart murmur   . Hyperlipidemia   . Hypertension   . Lyme disease    states had shakes that were thought to be parkinson's related but related to tick bite, states also RMSF  . Myocardial infarction ([place])   . Shortness of breath   . Sleep apnea    uses cpap   Past Surgical History:  Procedure Laterality Date  . APPENDECTOMY    . CERVICAL LAMINECTOMY    . CHOLECYSTECTOMY    . KIDNEY STONE SURGERY    . LEFT HEART CATH N/A 02/15/2012   Procedure: LEFT HEART CATH;  Surgeon: Larey Dresser, MD;  Location: [facility] CATH LAB;  Service: Cardiovascular;  Laterality: N/A;   Family History  Problem Relation Age of Onset  . ALS Father   . Asthma Daughter   . Cancer Sister     breast/liver   Social History  Substance Use Topics  . Smoking status: Former Smoker    Packs/day: 1.50    Years: 8.00    Types: Cigarettes    Quit date:  03/05/1958  . Smokeless tobacco: Never Used  . Alcohol use No    Review of Systems  All other systems reviewed and are negative.   Allergies  Codeine sulfate and Cephalexin  Home Medications   Prior to Admission medications   Medication Sig Start Date End Date Taking? Authorizing Provider  acetaminophen (TYLENOL) 325 MG tablet Take 650 mg by mouth every evening. Reported on 06/28/2015    Historical Provider, MD  albuterol (PROVENTIL HFA;VENTOLIN HFA) 108 (90 Base) MCG/ACT inhaler Inhale 2 puffs into the lungs every 4 (four) hours as needed for wheezing or shortness of breath. 09/27/15   Marin Olp, MD  aspirin EC 81 MG tablet Take 1 tablet (81 mg total) by mouth daily. 03/11/12   Larey Dresser, MD  ASSURE COMFORT LANCETS 30G MISC 1 each by Other route daily. 12/02/13   Historical Provider, MD  atorvastatin (LIPITOR) 10 MG tablet TAKE 1 TABLET BY MOUTH ONCE A DAY 02/23/15   Marin Olp, MD  betamethasone dipropionate (DIPROLENE) 0.05 % ointment APPLY TO THE OUTSIDE OF EAR DAILY FOR ONE WEEK MAXIMUM 04/26/15   Marin Olp, MD  calcium-vitamin D (OSCAL WITH D) 500-200 MG-UNIT per tablet Take 1 tablet by mouth  daily.    Historical Provider, MD  cyclobenzaprine (FLEXERIL) 5 MG tablet 1 tablet every 8 hours if needed for muscle spasms. 09/30/15   Bjorn Pippin, PA-C  glimepiride (AMARYL) 4 MG tablet Take 1.5 tablets daily. 02/24/15   Marin Olp, MD  glucose blood (ACCU-CHEK AVIVA PLUS) test strip Test blood glucose daily. 07/29/15   Marin Olp, MD  glucose blood test strip One Touch Ultra Test Strips 08/02/15   Marin Olp, MD  HYDROcodone-acetaminophen (NORCO/VICODIN) 5-325 MG tablet Take 1 tablet by mouth every 6 (six) hours as needed for moderate pain. 09/30/15   Bjorn Pippin, PA-C  isosorbide mononitrate (IMDUR) 30 MG 24 hr tablet TAKE THREE TABLETS BY MOUTH ONCE DAILY 02/23/15   Marin Olp, MD  lisinopril (PRINIVIL,ZESTRIL) 5 MG tablet Take 0.5 tablets  (2.5 mg total) by mouth daily. 02/24/15   Marin Olp, MD  metFORMIN (GLUCOPHAGE) 500 MG tablet Take 0.5 tablets (250 mg total) by mouth daily with breakfast. 06/29/15   Marin Olp, MD  metoprolol succinate (TOPROL-XL) 50 MG 24 hr tablet TAKE ONE TABLET BY MOUTH ONCE DAILY. TAKE WITH OR IMMEDIATELY FOLLOWING A MEAL 07/11/15   Marin Olp, MD  nitroGLYCERIN (NITROSTAT) 0.4 MG SL tablet Place 1 tablet (0.4 mg total) under the tongue every 5 (five) minutes as needed. For chest pain 06/14/14   Larey Dresser, MD  Omega-3 Fatty Acids (FISH OIL PO) Take 2 capsules by mouth 2 (two) times daily.    Historical Provider, MD  Polyethyl Glycol-Propyl Glycol (SYSTANE OP) Place 1 drop into both eyes 4 (four) times daily. Itchy/dry eye    Historical Provider, MD  potassium citrate (UROCIT-K) 10 MEQ (1080 MG) SR tablet Take 10 mEq by mouth Twice daily. 01/29/12   Historical Provider, MD  ranitidine (ZANTAC) 300 MG tablet TAKE 1 TABLET BY MOUTH TWICE A DAY 08/04/15   Laurey Morale, MD  ranolazine (RANEXA) 500 MG 12 hr tablet Take 1 tablet (500 mg total) by mouth 2 (two) times daily. 02/17/15   Larey Dresser, MD  SYMBICORT 160-4.5 MCG/ACT inhaler INHALE TWO PUFFS BY MOUTH TWICE DAILY 09/30/15   Marin Olp, MD  traMADol (ULTRAM) 50 MG tablet TAKE ONE TABLET BY MOUTH EVERY 6 HOURS AS NEEDED FOR PAIN WITH  TYLENOL 09/08/15   Marin Olp, MD  traZODone (DESYREL) 150 MG tablet TAKE ONE TABLET BY MOUTH ONCE DAILY 01/31/15   Marin Olp, MD  zinc sulfate 220 MG capsule Take 220 mg by mouth daily.    Historical Provider, MD   Meds Ordered and Administered this Visit   Medications  HYDROcodone-acetaminophen (NORCO/VICODIN) 5-325 MG per tablet 1 tablet (1 tablet Oral Given 09/30/15 1938)    BP 173/83 (BP Location: Left Arm)   Pulse 63   Temp 97.5 F (36.4 C) (Oral)   Resp 20   SpO2 98%  No data found.   Physical Exam  Constitutional: He is oriented to person, place, and time. He appears  well-developed and well-nourished. No distress.  Sitting in wheelchair  Musculoskeletal:  No obvious swelling is noted along lumbar spine. Pain along upper lumbar spine. No para spinal tenderness is noted.   Neurological: He is alert and oriented to person, place, and time. He exhibits normal muscle tone. Coordination normal.  Skin: Skin is warm and dry. He is not diaphoretic.  Psychiatric: His behavior is normal.  Nursing note and vitals reviewed.   Urgent Care Course  Clinical Course  Value Comment By Time  DG Lumbar Spine Complete (Reviewed) Bjorn Pippin, PA-C 07/28 1854  DG Lumbar Spine Complete (Reviewed) Bjorn Pippin, PA-C 07/28 1902    Procedures (including critical care time)  Labs Review Labs Reviewed - No data to display  Imaging Review Dg Lumbar Spine Complete  Result Date: 09/30/2015 CLINICAL DATA:  Fall off lawn more today, landed on back. EXAM: LUMBAR SPINE - COMPLETE 4+ VIEW COMPARISON:  None. FINDINGS: Degenerative spurring throughout the lumbar spine. Mild degenerative facet disease in the lower lumbar spine. Normal alignment. No fracture. SI joints are symmetric and unremarkable. IMPRESSION: Degenerative changes.  No acute findings. Electronically Signed   By: Rolm Baptise M.D.   On: 09/30/2015 19:12    Visual Acuity Review  Right Eye Distance:   Left Eye Distance:   Bilateral Distance:    Right Eye Near:   Left Eye Near:    Bilateral Near:         MDM   1. Midline low back pain without sciatica   2. Fall, initial encounter   3. DDD (degenerative disc disease), lumbar    No fracture or acute findings by xray. Treat symptomatically with Hydrocodone as needed (no allergy to this) and prn low dose muscle relaxer. No NSAID's in the setting of CKD. Suggest heat/ice, gentle ROM, avoid lifting and taking it easy. If he is not improving then suggest f/u with ortho.     Bjorn Pippin, PA-C 09/30/15 1942

## 2015-09-30 NOTE — Discharge Instructions (Signed)
So sorry you fell. Your xrays show some wear and tear arthritis, but otherwise no fractures. Suspect this is a contusion. Treat with rest, heat, gentle ROM. May use pain medications as needed for pain. Muscle relaxer's as needed. You cannot have aleve or advil with your kidney disease so avoid them. If you are not improving then f/u with the Orthopedic on call.  Hope you feel better.

## 2015-10-04 DIAGNOSIS — M545 Low back pain: Secondary | ICD-10-CM | POA: Diagnosis not present

## 2015-10-25 ENCOUNTER — Ambulatory Visit: Payer: PPO | Admitting: Pulmonary Disease

## 2015-10-28 ENCOUNTER — Ambulatory Visit (INDEPENDENT_AMBULATORY_CARE_PROVIDER_SITE_OTHER): Payer: PPO | Admitting: Pulmonary Disease

## 2015-10-28 ENCOUNTER — Encounter: Payer: Self-pay | Admitting: Pulmonary Disease

## 2015-10-28 ENCOUNTER — Other Ambulatory Visit: Payer: Self-pay | Admitting: Family Medicine

## 2015-10-28 DIAGNOSIS — J4530 Mild persistent asthma, uncomplicated: Secondary | ICD-10-CM | POA: Diagnosis not present

## 2015-10-28 NOTE — Progress Notes (Signed)
History of Present Illness Roy [name] is a 82 y.o. male with asthma followed by Dr. Lake Bells  Synopsis Chronic obstructive asthma, minimal smoking history.  Only smoked about 10 years in the 1950's. 07/13/2014 pulmonary function testing> ratio 69%, FEV1 2.51 L (87% predicted, obstruction resolved after bronchodilator and FEV1 increased to 2.58 L, 2% change with bronchodilator Total lung capacity 5.81 L (82% predicted) DLCO 20.14 (62% predicted). 07/2014 CT chest ([place]) > no ILD or bronchiectasis  10/28/2015 Annual Follow Up: Pt. Presents to the office today for follow up. He did recently fall in July while mowing his church yard, he went to the ED and was checked out. He did not have any breaks, just bruising.He states his breathing is doing well. He has no issues, and no exacerbations. He is compliant with his Symbicort 2 puffs am and pm. He has not had to use his rescue inhaler at all.He has no cough, either productive or non-productive.   Tests: 07/13/2014 pulmonary function testing> ratio 69%, FEV1 2.51 L (87% predicted, obstruction resolved after bronchodilator and FEV1 increased to 2.58 L, 2% change with bronchodilator Total lung capacity 5.81 L (82% predicted) DLCO 20.14 (62% predicted).   Past medical hx Past Medical History:  Diagnosis Date  . Anginal pain ([place])   . Arthritis    spine  . Asthma   . CAD (coronary artery disease)   . CAP (community acquired pneumonia) 12/26/2011  . Chest pain on exertion 02/2012  . Chronic kidney disease    renal insufficiency  . Diabetes mellitus, type 2 ([place])   . Fever 12/26/2011  . GERD (gastroesophageal reflux disease)   . Heart murmur   . Hyperlipidemia   . Hypertension   . Lyme disease    states had shakes that were thought to be parkinson's related but related to tick bite, states also RMSF  . Myocardial infarction ([place])   . Shortness of breath   . Sleep apnea    uses cpap     Past surgical hx, Family hx, Social hx all  reviewed.  Current Outpatient Prescriptions on File Prior to Visit  Medication Sig  . acetaminophen (TYLENOL) 325 MG tablet Take 650 mg by mouth every evening. Reported on 06/28/2015  . albuterol (PROVENTIL HFA;VENTOLIN HFA) 108 (90 Base) MCG/ACT inhaler Inhale 2 puffs into the lungs every 4 (four) hours as needed for wheezing or shortness of breath.  [name] aspirin EC 81 MG tablet Take 1 tablet (81 mg total) by mouth daily.  . ASSURE COMFORT LANCETS 30G MISC 1 each by Other route daily.  [name] atorvastatin (LIPITOR) 10 MG tablet TAKE 1 TABLET BY MOUTH ONCE A DAY  . betamethasone dipropionate (DIPROLENE) 0.05 % ointment APPLY TO THE OUTSIDE OF EAR DAILY FOR ONE WEEK MAXIMUM  . calcium-vitamin D (OSCAL WITH D) 500-200 MG-UNIT per tablet Take 1 tablet by mouth daily.  . cyclobenzaprine (FLEXERIL) 5 MG tablet 1 tablet every 8 hours if needed for muscle spasms.  [name] glimepiride (AMARYL) 4 MG tablet Take 1.5 tablets daily.  [name] glucose blood (ACCU-CHEK AVIVA PLUS) test strip Test blood glucose daily.  [name] glucose blood test strip One Touch Ultra Test Strips  . HYDROcodone-acetaminophen (NORCO/VICODIN) 5-325 MG tablet Take 1 tablet by mouth every 6 (six) hours as needed for moderate pain.  . isosorbide mononitrate (IMDUR) 30 MG 24 hr tablet TAKE THREE TABLETS BY MOUTH ONCE DAILY  . lisinopril (PRINIVIL,ZESTRIL) 5 MG tablet Take 0.5 tablets (2.5 mg total) by mouth daily.  [name]  metFORMIN (GLUCOPHAGE) 500 MG tablet Take 0.5 tablets (250 mg total) by mouth daily with breakfast.  . metoprolol succinate (TOPROL-XL) 50 MG 24 hr tablet TAKE ONE TABLET BY MOUTH ONCE DAILY. TAKE WITH OR IMMEDIATELY FOLLOWING A MEAL  . nitroGLYCERIN (NITROSTAT) 0.4 MG SL tablet Place 1 tablet (0.4 mg total) under the tongue every 5 (five) minutes as needed. For chest pain  . Omega-3 Fatty Acids (FISH OIL PO) Take 2 capsules by mouth 2 (two) times daily.  [name] (SYSTANE OP) Place 1 drop into both eyes 4 (four) times  daily. Itchy/dry eye  . potassium citrate (UROCIT-K) 10 MEQ (1080 MG) SR tablet Take 10 mEq by mouth Twice daily.  . ranolazine (RANEXA) 500 MG 12 hr tablet Take 1 tablet (500 mg total) by mouth 2 (two) times daily.  . SYMBICORT 160-4.5 MCG/ACT inhaler INHALE TWO PUFFS BY MOUTH TWICE DAILY  . traMADol (ULTRAM) 50 MG tablet TAKE ONE TABLET BY MOUTH EVERY 6 HOURS AS NEEDED FOR PAIN WITH  TYLENOL  . traZODone (DESYREL) 150 MG tablet TAKE ONE TABLET BY MOUTH ONCE DAILY  . zinc sulfate 220 MG capsule Take 220 mg by mouth daily.   No current facility-administered medications on file prior to visit.      Allergies  Allergen Reactions  . Codeine Sulfate Other (See Comments)    Chest pain  . Cephalexin Rash    Review Of Systems:  Constitutional:   No  weight loss, night sweats,  Fevers, chills, fatigue, or  lassitude.  HEENT:   No headaches,  Difficulty swallowing,  Tooth/dental problems, or  Sore throat,                No sneezing, itching, ear ache, nasal congestion, post nasal drip,   CV:  No chest pain,  Orthopnea, PND, swelling in lower extremities, anasarca, dizziness, palpitations, syncope.   GI  No heartburn, indigestion, abdominal pain, nausea, vomiting, diarrhea, change in bowel habits, loss of appetite, bloody stools.   Resp: No shortness of breath with exertion or at rest.  No excess mucus, no productive cough,  No non-productive cough,  No coughing up of blood.  No change in color of mucus.  No wheezing.  No chest wall deformity  Skin: no rash or lesions.  GU: no dysuria, change in color of urine, no urgency or frequency.  No flank pain, no hematuria   MS:  + joint pain no swelling.  No decreased range of motion.  + back pain.  Psych:  No change in mood or affect. No depression or anxiety.  No memory loss.   Vital Signs BP 130/72 (BP Location: Left Arm)   Pulse 68   Ht 5\' 10"  (1.778 m)   Wt 185 lb 9.6 oz (84.2 kg)   SpO2 96%   BMI 26.63 kg/m    Physical  Exam:  General- No distress,  A&Ox3, pleasant and appears younger than stated age. ENT: No sinus tenderness, TM clear, pale nasal mucosa, no oral exudate,no post nasal drip, no LAN Cardiac: S1, S2, regular rate and rhythm, no murmur Chest: No wheeze/ rales/ dullness; no accessory muscle use, no nasal flaring, no sternal retractions Abd.: Soft Non-tender Ext: No clubbing cyanosis, edema Neuro:  normal strength Skin: No rashes, warm and dry Psych: normal mood and behavior   Assessment/Plan  Mild persistent asthma Stable interval without exacerbations: Recent fall, but he is recovering. Plan: Continue using your symbicort 2 puffs in the morning and 2 puffs  in the evening. Remember to rinse your mouth after using. Use your rescue inhaler as needed. Remember to get your Tetanus shot. Remember to get your flu shot this fall. Follow up with with Dr. Lake Bells in 1 year. Please contact office for sooner follow up if symptoms do not improve or worsen or seek emergency care      Magdalen Spatz, NP 10/28/2015  2:57 PM

## 2015-10-28 NOTE — Patient Instructions (Addendum)
It is nice to meet you today. Continue using your symbicort 2 puffs in the morning and 2 puffs in the evening. Remember to rinse your mouth after using. Use your rescue inhaler as needed. Remember to get your Tetanus shot. Remember to get your flu shot this fall. Follow up with with Dr. Lake Bells in 1 year. Please contact office for sooner follow up if symptoms do not improve or worsen or seek emergency care

## 2015-10-28 NOTE — Assessment & Plan Note (Addendum)
Stable interval without exacerbations: Recent fall, but he is recovering. Plan: Continue using your symbicort 2 puffs in the morning and 2 puffs in the evening. Remember to rinse your mouth after using. Use your rescue inhaler as needed. Remember to get your Tetanus shot. Remember to get your flu shot this fall. Follow up with with Dr. Lake Bells in 1 year. Please contact office for sooner follow up if symptoms do not improve or worsen or seek emergency care

## 2015-10-28 NOTE — Progress Notes (Signed)
Attending:  I have seen and examined the patient with nurse practitioner/resident and agree with the note above.  We formulated the plan together and I elicited the following history.    This is been a stable interval for Mr. Appel, no respiratory complaints recently, continues to take Symbicort but his pressure did with the cost.  On exam: Well-appearing Lungs clear to auscultation, no wheezing today Cardiovascular regular rate and rhythm no murmurs gallops or rubs  Chronic obstructive asthma: Continue Symbicort, advised to apply for financial assistance, flu shot in the fall  > 50% of this 27 minute visit spent face to face  Follow-up 6 months  Roselie Awkward, MD [employer] [facility] Pager: [phone] Cell: [phone] After 3pm or if no response, call [phone]

## 2015-11-11 ENCOUNTER — Other Ambulatory Visit: Payer: Self-pay | Admitting: Family Medicine

## 2015-11-11 DIAGNOSIS — E119 Type 2 diabetes mellitus without complications: Secondary | ICD-10-CM

## 2015-11-16 ENCOUNTER — Encounter: Payer: Self-pay | Admitting: Internal Medicine

## 2015-11-18 ENCOUNTER — Telehealth: Payer: Self-pay | Admitting: Pulmonary Disease

## 2015-11-18 NOTE — Telephone Encounter (Signed)
Called spoke with spouse. Requesting symbicort sample. We currently did not have any. Nothing further needed

## 2015-11-23 ENCOUNTER — Other Ambulatory Visit: Payer: Self-pay | Admitting: Cardiology

## 2015-11-23 DIAGNOSIS — I2510 Atherosclerotic heart disease of native coronary artery without angina pectoris: Secondary | ICD-10-CM

## 2015-11-23 DIAGNOSIS — R0602 Shortness of breath: Secondary | ICD-10-CM

## 2015-12-14 ENCOUNTER — Other Ambulatory Visit: Payer: Self-pay | Admitting: Family Medicine

## 2016-01-03 ENCOUNTER — Ambulatory Visit (INDEPENDENT_AMBULATORY_CARE_PROVIDER_SITE_OTHER): Payer: PPO | Admitting: Family Medicine

## 2016-01-03 ENCOUNTER — Encounter: Payer: Self-pay | Admitting: Family Medicine

## 2016-01-03 DIAGNOSIS — I10 Essential (primary) hypertension: Secondary | ICD-10-CM | POA: Diagnosis not present

## 2016-01-03 DIAGNOSIS — N183 Chronic kidney disease, stage 3 unspecified: Secondary | ICD-10-CM

## 2016-01-03 DIAGNOSIS — E1122 Type 2 diabetes mellitus with diabetic chronic kidney disease: Secondary | ICD-10-CM

## 2016-01-03 LAB — HEMOGLOBIN A1C: HEMOGLOBIN A1C: 6 % (ref 4.6–6.5)

## 2016-01-03 LAB — CBC WITH DIFFERENTIAL/PLATELET
BASOS ABS: 0 10*3/uL (ref 0.0–0.1)
Basophils Relative: 0.5 % (ref 0.0–3.0)
Eosinophils Absolute: 1.5 10*3/uL — ABNORMAL HIGH (ref 0.0–0.7)
Eosinophils Relative: 20.6 % — ABNORMAL HIGH (ref 0.0–5.0)
HCT: 34.1 % — ABNORMAL LOW (ref 39.0–52.0)
Hemoglobin: 11.5 g/dL — ABNORMAL LOW (ref 13.0–17.0)
LYMPHS ABS: 1.2 10*3/uL (ref 0.7–4.0)
Lymphocytes Relative: 17.3 % (ref 12.0–46.0)
MCHC: 33.6 g/dL (ref 30.0–36.0)
MCV: 86.4 fl (ref 78.0–100.0)
MONOS PCT: 7.3 % (ref 3.0–12.0)
Monocytes Absolute: 0.5 10*3/uL (ref 0.1–1.0)
NEUTROS ABS: 3.9 10*3/uL (ref 1.4–7.7)
NEUTROS PCT: 54.3 % (ref 43.0–77.0)
PLATELETS: 130 10*3/uL — AB (ref 150.0–400.0)
RBC: 3.95 Mil/uL — ABNORMAL LOW (ref 4.22–5.81)
RDW: 15 % (ref 11.5–15.5)
WBC: 7.1 10*3/uL (ref 4.0–10.5)

## 2016-01-03 LAB — BASIC METABOLIC PANEL
BUN: 23 mg/dL (ref 6–23)
CALCIUM: 9.4 mg/dL (ref 8.4–10.5)
CHLORIDE: 104 meq/L (ref 96–112)
CO2: 28 mEq/L (ref 19–32)
CREATININE: 1.77 mg/dL — AB (ref 0.40–1.50)
GFR: 39.43 mL/min — ABNORMAL LOW (ref >60.00)
Glucose, Bld: 113 mg/dL — ABNORMAL HIGH (ref 70–99)
Potassium: 4.5 mEq/L (ref 3.5–5.1)
Sodium: 140 mEq/L (ref 135–145)

## 2016-01-03 MED ORDER — TETANUS-DIPHTH-ACELL PERTUSSIS 5-2.5-18.5 LF-MCG/0.5 IM SUSP: 0.5000 mL | Freq: Once | INTRAMUSCULAR | 0 refills | Status: AC

## 2016-01-03 NOTE — Patient Instructions (Addendum)
We spoke with your pharmacy. It looks like they gave you flu and shingles shot but not tetanus. I want you to return to get tetanus and let us know the date you get it.   Labs before you leave  I would also like for you to sign up for an annual wellness visit with our nurse, Manuela Schwartz, before the end of the year if possible who specializes in the annual wellness exam. This is a free benefit under medicare that may help [place] find additional ways to help you. Some highlights are reviewing medications, lifestyle, and doing a dementia screen. You could bring date of your tetanus shot to her  Then see me in 14 weeks for diabetes follow up

## 2016-01-03 NOTE — Progress Notes (Signed)
Subjective:  Roy [name] is a 82 y.o. year old very pleasant male patient who presents for/with See problem oriented charting ROS- has not had to use nitroglycerin. No chest pain or shortness of breath. No headache or blurry vision. .see any ROS included in HPI as well.   Past Medical History-  Patient Active Problem List   Diagnosis Date Noted  . Type II diabetes mellitus with renal manifestations ([place]) 09/11/2006    Priority: High  . Coronary artery disease with angina pectoris ([place]) 09/11/2006    Priority: High  . Thrombocytopenia ([place]) 02/23/2015    Priority: Medium  . Mild persistent asthma 10/30/2010    Priority: Medium  . CKD (chronic kidney disease), stage III 02/22/2009    Priority: Medium  . Hyperlipidemia 09/11/2006    Priority: Medium  . Essential hypertension 09/11/2006    Priority: Medium  . Back pain 09/11/2006    Priority: Medium  . Sleep apnea 09/11/2006    Priority: Medium  . GERD (gastroesophageal reflux disease) 05/03/2014    Priority: Low  . Insomnia 05/03/2014    Priority: Low  . Allergic rhinitis 04/21/2014    Priority: Low  . Giardia 04/21/2014    Priority: Low  . NEPHROLITHIASIS, RECURRENT 01/21/2009    Priority: Low  . ACTINIC KERATOSIS, HEAD 06/14/2008    Priority: Low    Medications- reviewed and updated Current Outpatient Prescriptions  Medication Sig Dispense Refill  . acetaminophen (TYLENOL) 325 MG tablet Take 650 mg by mouth every evening. Reported on 06/28/2015    . albuterol (PROVENTIL HFA;VENTOLIN HFA) 108 (90 Base) MCG/ACT inhaler Inhale 2 puffs into the lungs every 4 (four) hours as needed for wheezing or shortness of breath. 1 Inhaler 10  . aspirin EC 81 MG tablet Take 1 tablet (81 mg total) by mouth daily.    . ASSURE COMFORT LANCETS 30G MISC 1 each by Other route daily.    [name] atorvastatin (LIPITOR) 10 MG tablet TAKE 1 TABLET BY MOUTH ONCE A DAY 90 tablet 3  . betamethasone dipropionate (DIPROLENE) 0.05 % ointment APPLY TO THE  OUTSIDE OF EAR DAILY FOR ONE WEEK MAXIMUM 30 g 1  . calcium-vitamin D (OSCAL WITH D) 500-200 MG-UNIT per tablet Take 1 tablet by mouth daily.    [name] glimepiride (AMARYL) 4 MG tablet TAKE ONE & ONE-HALF TABLETS BY MOUTH ONCE DAILY 90 tablet 3  . glucose blood (ACCU-CHEK AVIVA PLUS) test strip Test blood glucose daily. 100 each 12  . glucose blood test strip One Touch Ultra Test Strips 100 each 12  . isosorbide mononitrate (IMDUR) 30 MG 24 hr tablet TAKE THREE TABLETS BY MOUTH ONCE DAILY 180 tablet 3  . lisinopril (PRINIVIL,ZESTRIL) 5 MG tablet Take 0.5 tablets (2.5 mg total) by mouth daily. 30 tablet 5  . metFORMIN (GLUCOPHAGE) 500 MG tablet Take 0.5 tablets (250 mg total) by mouth daily with breakfast. 90 tablet 3  . metoprolol succinate (TOPROL-XL) 50 MG 24 hr tablet TAKE ONE TABLET BY MOUTH ONCE DAILY. TAKE WITH OR IMMEDIATELY FOLLOWING A MEAL 90 tablet 2  . nitroGLYCERIN (NITROSTAT) 0.4 MG SL tablet Place 1 tablet (0.4 mg total) under the tongue every 5 (five) minutes as needed for chest pain. 25 tablet 1  . Omega-3 Fatty Acids (FISH OIL PO) Take 2 capsules by mouth 2 (two) times daily.    [name] (SYSTANE OP) Place 1 drop into both eyes 4 (four) times daily. Itchy/dry eye    . potassium citrate (UROCIT-K) 10  MEQ (1080 MG) SR tablet Take 10 mEq by mouth Twice daily.    . ranitidine (ZANTAC) 300 MG tablet TAKE ONE TABLET BY MOUTH TWICE DAILY 180 tablet 0  . ranolazine (RANEXA) 500 MG 12 hr tablet Take 1 tablet (500 mg total) by mouth 2 (two) times daily. 60 tablet 11  . SYMBICORT 160-4.5 MCG/ACT inhaler INHALE TWO PUFFS BY MOUTH TWICE DAILY 1 Inhaler 10  . traMADol (ULTRAM) 50 MG tablet TAKE ONE TABLET BY MOUTH EVERY 6 HOURS AS NEEDED FOR PAIN WITH  TYLENOL 90 tablet 0  . traZODone (DESYREL) 150 MG tablet TAKE ONE TABLET BY MOUTH ONCE DAILY 90 tablet 2  . zinc sulfate 220 MG capsule Take 220 mg by mouth daily.     No current facility-administered medications for this  visit.     Objective: BP 138/78   Pulse (!) 59   Temp 97.7 F (36.5 C) (Oral)   Wt 192 lb 6.4 oz (87.3 kg)   SpO2 96%   BMI 27.61 kg/m  Gen: NAD CV: RRR no murmurs rubs or gallops Lungs: CTAB no crackles, wheeze, rhonchi Ext: trace edema  Assessment/Plan:  Type II diabetes mellitus with renal manifestations ([facility]) S: improving control last visit On amaryl 6mg , metformin 250mg  daily with GFR 30-40 range. We did not change regimen after last visit.  CBGs- fasting 80s to 120s Lab Results  Component Value Date   HGBA1C 7.2 (H) 09/27/2015   HGBA1C 7.4 (H) 06/28/2015   HGBA1C 8.0 (H) 02/23/2015   A/P: continue with a1c goal of 7.5- check a1c today  Essential hypertension S: controlled on repeat on imdur 90mg , lisinopril 5mg , metoprolol 50mg  BP Readings from Last 3 Encounters:  01/03/16 138/78  10/28/15 130/72  09/30/15 173/83  A/P:Continue current medications, watch as appears to be close to goal  CKD (chronic kidney disease), stage III S: GFR stable in recent visits around 35 (30-40 range).  A/P: need close monitoring because if worsens- will have to stop metformin. Continue lisinopril 5mg    14 weeks.  AWV advised before end of year  Orders Placed This Encounter  Procedures  . CBC with Differential/Platelet  . Basic metabolic panel    [place]  . Hemoglobin A1c    [place]    Return precautions advised.  [name], MD

## 2016-01-03 NOTE — Assessment & Plan Note (Signed)
S: improving control last visit On amaryl 6mg , metformin 250mg  daily with GFR 30-40 range. We did not change regimen after last visit.  CBGs- fasting 80s to 120s Lab Results  Component Value Date   HGBA1C 7.2 (H) 09/27/2015   HGBA1C 7.4 (H) 06/28/2015   HGBA1C 8.0 (H) 02/23/2015   A/P: continue with a1c goal of 7.5- check a1c today

## 2016-01-03 NOTE — Assessment & Plan Note (Signed)
S: GFR stable in recent visits around 35 (30-40 range).  A/P: need close monitoring because if worsens- will have to stop metformin. Continue lisinopril 5mg 

## 2016-01-03 NOTE — Progress Notes (Signed)
Pre visit review using our clinic review tool, if applicable. No additional management support is needed unless otherwise documented below in the visit note. 

## 2016-01-03 NOTE — Assessment & Plan Note (Signed)
S: controlled on repeat on imdur 90mg , lisinopril 5mg , metoprolol 50mg  BP Readings from Last 3 Encounters:  01/03/16 138/78  10/28/15 130/72  09/30/15 173/83  A/P:Continue current medications, watch as appears to be close to goal

## 2016-01-11 ENCOUNTER — Other Ambulatory Visit (INDEPENDENT_AMBULATORY_CARE_PROVIDER_SITE_OTHER): Payer: PPO

## 2016-01-11 DIAGNOSIS — K921 Melena: Secondary | ICD-10-CM

## 2016-01-11 LAB — POC HEMOCCULT BLD/STL (HOME/3-CARD/SCREEN)
Card #2 Fecal Occult Blod, POC: NEGATIVE
FECAL OCCULT BLD: NEGATIVE
Fecal Occult Blood, POC: NEGATIVE

## 2016-01-14 ENCOUNTER — Other Ambulatory Visit: Payer: Self-pay | Admitting: Family Medicine

## 2016-01-23 ENCOUNTER — Ambulatory Visit: Payer: PPO | Admitting: Internal Medicine

## 2016-01-27 ENCOUNTER — Other Ambulatory Visit: Payer: Self-pay | Admitting: Family Medicine

## 2016-02-03 ENCOUNTER — Ambulatory Visit (INDEPENDENT_AMBULATORY_CARE_PROVIDER_SITE_OTHER): Payer: PPO

## 2016-02-03 VITALS — BP 140/70 | HR 61 | Ht 69.0 in | Wt 190.3 lb

## 2016-02-03 DIAGNOSIS — Z0000 Encounter for general adult medical examination without abnormal findings: Secondary | ICD-10-CM

## 2016-02-03 NOTE — Patient Instructions (Addendum)
Roy [name] , Thank you for taking time to come for your Medicare Wellness Visit. I appreciate your ongoing commitment to your health goals. Please review the following plan we discussed and let me know if I can assist you in the future.   To bring a copy Health care POA and Living will or check to be sure this is done   If you have a "dirty" injury; laceration; you can take the tetanus with pertussis; TDAP anytime;   Also can check with the manager and request what the billing  from the pharmacy this last year.   These are the goals we discussed: Goals    . patient          Keep maintaining and staying active        This is a list of the screening recommended for you and due dates:  Health Maintenance  Topic Date Due  . Tetanus Vaccine  06/28/2015  . Eye exam for diabetics  02/07/2016  . Complete foot exam   02/23/2016  . Hemoglobin A1C  07/02/2016  . Flu Shot  Addressed  . Shingles Vaccine  Completed  . Pneumonia vaccines  Completed        Fall Prevention in the Home Introduction Falls can cause injuries. They can happen to people of all ages. There are many things you can do to make your home safe and to help prevent falls. What can I do on the outside of my home?  Regularly fix the edges of walkways and driveways and fix any cracks.  Remove anything that might make you trip as you walk through a door, such as a raised step or threshold.  Trim any bushes or trees on the path to your home.  Use bright outdoor lighting.  Clear any walking paths of anything that might make someone trip, such as rocks or tools.  Regularly check to see if handrails are loose or broken. Make sure that both sides of any steps have handrails.  Any raised decks and porches should have guardrails on the edges.  Have any leaves, snow, or ice cleared regularly.  Use sand or salt on walking paths during winter.  Clean up any spills in your garage right away. This includes oil or grease  spills. What can I do in the bathroom?  Use night lights.  Install grab bars by the toilet and in the tub and shower. Do not use towel bars as grab bars.  Use non-skid mats or decals in the tub or shower.  If you need to sit down in the shower, use a plastic, non-slip stool.  Keep the floor dry. Clean up any water that spills on the floor as soon as it happens.  Remove soap buildup in the tub or shower regularly.  Attach bath mats securely with double-sided non-slip rug tape.  Do not have throw rugs and other things on the floor that can make you trip. What can I do in the bedroom?  Use night lights.  Make sure that you have a light by your bed that is easy to reach.  Do not use any sheets or blankets that are too big for your bed. They should not hang down onto the floor.  Have a firm chair that has side arms. You can use this for support while you get dressed.  Do not have throw rugs and other things on the floor that can make you trip. What can I do in the kitchen?  Clean  up any spills right away.  Avoid walking on wet floors.  Keep items that you use a lot in easy-to-reach places.  If you need to reach something above you, use a strong step stool that has a grab bar.  Keep electrical cords out of the way.  Do not use floor polish or wax that makes floors slippery. If you must use wax, use non-skid floor wax.  Do not have throw rugs and other things on the floor that can make you trip. What can I do with my stairs?  Do not leave any items on the stairs.  Make sure that there are handrails on both sides of the stairs and use them. Fix handrails that are broken or loose. Make sure that handrails are as long as the stairways.  Check any carpeting to make sure that it is firmly attached to the stairs. Fix any carpet that is loose or worn.  Avoid having throw rugs at the top or bottom of the stairs. If you do have throw rugs, attach them to the floor with carpet  tape.  Make sure that you have a light switch at the top of the stairs and the bottom of the stairs. If you do not have them, ask someone to add them for you. What else can I do to help prevent falls?  Wear shoes that:  Do not have high heels.  Have rubber bottoms.  Are comfortable and fit you well.  Are closed at the toe. Do not wear sandals.  If you use a stepladder:  Make sure that it is fully opened. Do not climb a closed stepladder.  Make sure that both sides of the stepladder are locked into place.  Ask someone to hold it for you, if possible.  Clearly mark and make sure that you can see:  Any grab bars or handrails.  First and last steps.  Where the edge of each step is.  Use tools that help you move around (mobility aids) if they are needed. These include:  Canes.  Walkers.  Scooters.  Crutches.  Turn on the lights when you go into a dark area. Replace any light bulbs as soon as they burn out.  Set up your furniture so you have a clear path. Avoid moving your furniture around.  If any of your floors are uneven, fix them.  If there are any pets around you, be aware of where they are.  Review your medicines with your doctor. Some medicines can make you feel dizzy. This can increase your chance of falling. Ask your doctor what other things that you can do to help prevent falls. This information is not intended to replace advice given to you by your health care provider. Make sure you discuss any questions you have with your health care provider. Document Released: 12/16/2008 Document Revised: 07/28/2015 Document Reviewed: 03/26/2014  2017 Elsevier  Health Maintenance, Male A healthy lifestyle and preventative care can promote health and wellness.  Maintain regular health, dental, and eye exams.  Eat a healthy diet. Foods like vegetables, fruits, whole grains, low-fat dairy products, and lean protein foods contain the nutrients you need and are low in  calories. Decrease your intake of foods high in solid fats, added sugars, and salt. Get information about a proper diet from your health care provider, if necessary.  Regular physical exercise is one of the most important things you can do for your health. Most adults should get at least 150 minutes of moderate-intensity  exercise (any activity that increases your heart rate and causes you to sweat) each week. In addition, most adults need muscle-strengthening exercises on 2 or more days a week.   Maintain a healthy weight. The body mass index (BMI) is a screening tool to identify possible weight problems. It provides an estimate of body fat based on height and weight. Your health care provider can find your BMI and can help you achieve or maintain a healthy weight. For males 20 years and older:  A BMI below 18.5 is considered underweight.  A BMI of 18.5 to 24.9 is normal.  A BMI of 25 to 29.9 is considered overweight.  A BMI of 30 and above is considered obese.  Maintain normal blood lipids and cholesterol by exercising and minimizing your intake of saturated fat. Eat a balanced diet with plenty of fruits and vegetables. Blood tests for lipids and cholesterol should begin at age 23 and be repeated every 5 years. If your lipid or cholesterol levels are high, you are over age 49, or you are at high risk for heart disease, you may need your cholesterol levels checked more frequently.Ongoing high lipid and cholesterol levels should be treated with medicines if diet and exercise are not working.  If you smoke, find out from your health care provider how to quit. If you do not use tobacco, do not start.  Lung cancer screening is recommended for adults aged 74-80 years who are at high risk for developing lung cancer because of a history of smoking. A yearly low-dose CT scan of the lungs is recommended for people who have at least a 30-pack-year history of smoking and are current smokers or have quit  within the past 15 years. A pack year of smoking is smoking an average of 1 pack of cigarettes a day for 1 year (for example, a 30-pack-year history of smoking could mean smoking 1 pack a day for 30 years or 2 packs a day for 15 years). Yearly screening should continue until the smoker has stopped smoking for at least 15 years. Yearly screening should be stopped for people who develop a health problem that would prevent them from having lung cancer treatment.  If you choose to drink alcohol, do not have more than 2 drinks per day. One drink is considered to be 12 oz (360 mL) of beer, 5 oz (150 mL) of wine, or 1.5 oz (45 mL) of liquor.  Avoid the use of street drugs. Do not share needles with anyone. Ask for help if you need support or instructions about stopping the use of drugs.  High blood pressure causes heart disease and increases the risk of stroke. High blood pressure is more likely to develop in:  People who have blood pressure in the end of the normal range (100-139/85-89 mm Hg).  People who are overweight or obese.  People who are African American.  If you are 62-70 years of age, have your blood pressure checked every 3-5 years. If you are 27 years of age or older, have your blood pressure checked every year. You should have your blood pressure measured twice-once when you are at a hospital or clinic, and once when you are not at a hospital or clinic. Record the average of the two measurements. To check your blood pressure when you are not at a hospital or clinic, you can use:  An automated blood pressure machine at a pharmacy.  A home blood pressure monitor.  If you are 45-79 years  old, ask your health care provider if you should take aspirin to prevent heart disease.  Diabetes screening involves taking a blood sample to check your fasting blood sugar level. This should be done once every 3 years after age 59 if you are at a normal weight and without risk factors for diabetes. Testing  should be considered at a younger age or be carried out more frequently if you are overweight and have at least 1 risk factor for diabetes.  Colorectal cancer can be detected and often prevented. Most routine colorectal cancer screening begins at the age of 8 and continues through age 52. However, your health care provider may recommend screening at an earlier age if you have risk factors for colon cancer. On a yearly basis, your health care provider may provide home test kits to check for hidden blood in the stool. A small camera at the end of a tube may be used to directly examine the colon (sigmoidoscopy or colonoscopy) to detect the earliest forms of colorectal cancer. Talk to your health care provider about this at age 24 when routine screening begins. A direct exam of the colon should be repeated every 5-10 years through age 74, unless early forms of precancerous polyps or small growths are found.  People who are at an increased risk for hepatitis B should be screened for this virus. You are considered at high risk for hepatitis B if:  You were born in a country where hepatitis B occurs often. Talk with your health care provider about which countries are considered high risk.  Your parents were born in a high-risk country and you have not received a shot to protect against hepatitis B (hepatitis B vaccine).  You have HIV or AIDS.  You use needles to inject street drugs.  You live with, or have sex with, someone who has hepatitis B.  You are a man who has sex with other men (MSM).  You get hemodialysis treatment.  You take certain medicines for conditions like cancer, organ transplantation, and autoimmune conditions.  Hepatitis C blood testing is recommended for all people born from [birth year] through 1963 and any individual with known risk factors for hepatitis C.  Healthy men should no longer receive prostate-specific antigen (PSA) blood tests as part of routine cancer screening. Talk to  your health care provider about prostate cancer screening.  Testicular cancer screening is not recommended for adolescents or adult males who have no symptoms. Screening includes self-exam, a health care provider exam, and other screening tests. Consult with your health care provider about any symptoms you have or any concerns you have about testicular cancer.  Practice safe sex. Use condoms and avoid high-risk sexual practices to reduce the spread of sexually transmitted infections (STIs).  You should be screened for STIs, including gonorrhea and chlamydia if:  You are sexually active and are younger than 24 years.  You are older than 24 years, and your health care provider tells you that you are at risk for this type of infection.  Your sexual activity has changed since you were last screened, and you are at an increased risk for chlamydia or gonorrhea. Ask your health care provider if you are at risk.  If you are at risk of being infected with HIV, it is recommended that you take a prescription medicine daily to prevent HIV infection. This is called pre-exposure prophylaxis (PrEP). You are considered at risk if:  You are a man who has sex with other men (MSM).  You are a heterosexual man who is sexually active with multiple partners.  You take drugs by injection.  You are sexually active with a partner who has HIV.  Talk with your health care provider about whether you are at high risk of being infected with HIV. If you choose to begin PrEP, you should first be tested for HIV. You should then be tested every 3 months for as long as you are taking PrEP.  Use sunscreen. Apply sunscreen liberally and repeatedly throughout the day. You should seek shade when your shadow is shorter than you. Protect yourself by wearing long sleeves, pants, a wide-brimmed hat, and sunglasses year round whenever you are outdoors.  Tell your health care provider of new moles or changes in moles, especially if  there is a change in shape or color. Also, tell your health care provider if a mole is larger than the size of a pencil eraser.  A one-time screening for abdominal aortic aneurysm (AAA) and surgical repair of large AAAs by ultrasound is recommended for men aged 49-75 years who are current or former smokers.  Stay current with your vaccines (immunizations). This information is not intended to replace advice given to you by your health care provider. Make sure you discuss any questions you have with your health care provider. Document Released: 08/18/2007 Document Revised: 03/12/2014 Document Reviewed: 11/23/2014 Elsevier Interactive Patient Education  2017 [employer].
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
## 2016-02-06 NOTE — Progress Notes (Signed)
Roy Vanloan R., DO  

## 2016-02-16 ENCOUNTER — Telehealth: Payer: Self-pay | Admitting: Pulmonary Disease

## 2016-02-16 MED ORDER — BUDESONIDE-FORMOTEROL FUMARATE 160-4.5 MCG/ACT IN AERO: 2.0000 | INHALATION_SPRAY | Freq: Two times a day (BID) | RESPIRATORY_TRACT | 0 refills | Status: DC

## 2016-02-16 NOTE — Telephone Encounter (Signed)
2 samples have been left up front for the pt and I have called and lmom to make them aware. Nothing further is needed.

## 2016-02-19 ENCOUNTER — Other Ambulatory Visit: Payer: Self-pay | Admitting: Family Medicine

## 2016-02-20 NOTE — Telephone Encounter (Signed)
Okay to refill? Please advise.  

## 2016-02-20 NOTE — Telephone Encounter (Signed)
Yes thanks 

## 2016-03-15 ENCOUNTER — Other Ambulatory Visit: Payer: Self-pay | Admitting: Cardiology

## 2016-03-15 DIAGNOSIS — E785 Hyperlipidemia, unspecified: Secondary | ICD-10-CM

## 2016-03-15 DIAGNOSIS — I10 Essential (primary) hypertension: Secondary | ICD-10-CM

## 2016-04-12 ENCOUNTER — Other Ambulatory Visit: Payer: Self-pay | Admitting: Cardiology

## 2016-04-12 ENCOUNTER — Other Ambulatory Visit: Payer: Self-pay | Admitting: Family Medicine

## 2016-04-12 DIAGNOSIS — I10 Essential (primary) hypertension: Secondary | ICD-10-CM

## 2016-04-12 DIAGNOSIS — E785 Hyperlipidemia, unspecified: Secondary | ICD-10-CM

## 2016-04-17 ENCOUNTER — Encounter: Payer: Self-pay | Admitting: Family Medicine

## 2016-04-17 ENCOUNTER — Ambulatory Visit (INDEPENDENT_AMBULATORY_CARE_PROVIDER_SITE_OTHER): Payer: PPO | Admitting: Family Medicine

## 2016-04-17 VITALS — BP 132/70 | HR 68 | Temp 98.0°F | Ht 69.0 in | Wt 190.4 lb

## 2016-04-17 DIAGNOSIS — I10 Essential (primary) hypertension: Secondary | ICD-10-CM | POA: Diagnosis not present

## 2016-04-17 DIAGNOSIS — D696 Thrombocytopenia, unspecified: Secondary | ICD-10-CM

## 2016-04-17 DIAGNOSIS — E1122 Type 2 diabetes mellitus with diabetic chronic kidney disease: Secondary | ICD-10-CM | POA: Diagnosis not present

## 2016-04-17 DIAGNOSIS — N183 Chronic kidney disease, stage 3 unspecified: Secondary | ICD-10-CM

## 2016-04-17 DIAGNOSIS — I25119 Atherosclerotic heart disease of native coronary artery with unspecified angina pectoris: Secondary | ICD-10-CM

## 2016-04-17 LAB — CBC WITH DIFFERENTIAL/PLATELET
BASOS PCT: 0.6 % (ref 0.0–3.0)
Basophils Absolute: 0.1 10*3/uL (ref 0.0–0.1)
EOS ABS: 1.2 10*3/uL — AB (ref 0.0–0.7)
Eosinophils Relative: 14.2 % — ABNORMAL HIGH (ref 0.0–5.0)
HEMATOCRIT: 37.6 % — AB (ref 39.0–52.0)
Hemoglobin: 12.6 g/dL — ABNORMAL LOW (ref 13.0–17.0)
Lymphocytes Relative: 15.1 % (ref 12.0–46.0)
Lymphs Abs: 1.3 10*3/uL (ref 0.7–4.0)
MCHC: 33.4 g/dL (ref 30.0–36.0)
MCV: 85.9 fl (ref 78.0–100.0)
MONO ABS: 0.5 10*3/uL (ref 0.1–1.0)
Monocytes Relative: 6 % (ref 3.0–12.0)
NEUTROS ABS: 5.4 10*3/uL (ref 1.4–7.7)
Neutrophils Relative %: 64.1 % (ref 43.0–77.0)
PLATELETS: 172 10*3/uL (ref 150.0–400.0)
RBC: 4.38 Mil/uL (ref 4.22–5.81)
RDW: 14.5 % (ref 11.5–15.5)
WBC: 8.4 10*3/uL (ref 4.0–10.5)

## 2016-04-17 LAB — COMPREHENSIVE METABOLIC PANEL
ALT: 18 U/L (ref 0–53)
AST: 16 U/L (ref 0–37)
Albumin: 4.2 g/dL (ref 3.5–5.2)
Alkaline Phosphatase: 66 U/L (ref 39–117)
BUN: 29 mg/dL — ABNORMAL HIGH (ref 6–23)
CALCIUM: 9.2 mg/dL (ref 8.4–10.5)
CHLORIDE: 103 meq/L (ref 96–112)
CO2: 29 meq/L (ref 19–32)
CREATININE: 1.74 mg/dL — AB (ref 0.40–1.50)
GFR: 40.18 mL/min — ABNORMAL LOW (ref >60.00)
Glucose, Bld: 125 mg/dL — ABNORMAL HIGH (ref 70–99)
Potassium: 4.4 mEq/L (ref 3.5–5.1)
SODIUM: 139 meq/L (ref 135–145)
Total Bilirubin: 0.6 mg/dL (ref 0.2–1.2)
Total Protein: 6.2 g/dL (ref 6.0–8.3)

## 2016-04-17 LAB — LIPID PANEL
CHOL/HDL RATIO: 4
Cholesterol: 117 mg/dL (ref 0–200)
HDL: 32.6 mg/dL — AB (ref >39.00)
LDL CALC: 57 mg/dL (ref 0–99)
NonHDL: 84.81
TRIGLYCERIDES: 140 mg/dL (ref 0.0–149.0)
VLDL: 28 mg/dL (ref 0.0–40.0)

## 2016-04-17 LAB — HEMOGLOBIN A1C: Hgb A1c MFr Bld: 6.7 % — ABNORMAL HIGH (ref 4.6–6.5)

## 2016-04-17 NOTE — Assessment & Plan Note (Signed)
S: GFR has remained stable between 30-40. Watching closely due to metformin. On lisinopril in case proteinuric.  A/P: update bmet today

## 2016-04-17 NOTE — Assessment & Plan Note (Signed)
S: has done really well with ranexa and imdur- has not had to take nitroglycerin since being on ranexa he states. Nonobstructive CAD on cath 2013- plan medical management A/P: continue current therapy with asa, atorvastatin, metoprolol, ranexa, imdur. Glad not using nitroglycerin but has on hand.

## 2016-04-17 NOTE — Assessment & Plan Note (Signed)
S: well controlled. On last check with amaryl 6mg , metformin 250mg  daily (GFR 30-40 range). No changes last visit due to excellent control CBGs- 80s up to 140 peak- up some from last visit.  Lab Results  Component Value Date   HGBA1C 6.0 01/03/2016   HGBA1C 7.2 (H) 09/27/2015   HGBA1C 7.4 (H) 06/28/2015   A/P:  update a1c today. Needs eye exam updated- he states had within a year, get records. Foot exam updated

## 2016-04-17 NOTE — Patient Instructions (Addendum)
Sign release of information at the check out desk for last eye exam   Please stop by lab before you go

## 2016-04-17 NOTE — Progress Notes (Signed)
Subjective:  Roy [name] is a 83 y.o. year old very pleasant male patient who presents for/with See problem oriented charting ROS- no chest pain on ranexa, no shortness of breath, no recent wheezing. Had some diarrhea last week now resolved- did not feel ill   Past Medical History-  Patient Active Problem List   Diagnosis Date Noted  . Type II diabetes mellitus with renal manifestations ([place]) 09/11/2006    Priority: High  . Coronary artery disease with angina pectoris ([place]) 09/11/2006    Priority: High  . Thrombocytopenia ([place]) 02/23/2015    Priority: Medium  . Mild persistent asthma 10/30/2010    Priority: Medium  . CKD (chronic kidney disease), stage III 02/22/2009    Priority: Medium  . Hyperlipidemia 09/11/2006    Priority: Medium  . Essential hypertension 09/11/2006    Priority: Medium  . Back pain 09/11/2006    Priority: Medium  . Sleep apnea 09/11/2006    Priority: Medium  . GERD (gastroesophageal reflux disease) 05/03/2014    Priority: Low  . Insomnia 05/03/2014    Priority: Low  . Allergic rhinitis 04/21/2014    Priority: Low  . Giardia 04/21/2014    Priority: Low  . NEPHROLITHIASIS, RECURRENT 01/21/2009    Priority: Low  . ACTINIC KERATOSIS, HEAD 06/14/2008    Priority: Low    Medications- reviewed and updated Current Outpatient Prescriptions  Medication Sig Dispense Refill  . acetaminophen (TYLENOL) 325 MG tablet Take 650 mg by mouth every evening. Reported on 06/28/2015    . albuterol (PROVENTIL HFA;VENTOLIN HFA) 108 (90 Base) MCG/ACT inhaler Inhale 2 puffs into the lungs every 4 (four) hours as needed for wheezing or shortness of breath. 1 Inhaler 10  . aspirin EC 81 MG tablet Take 1 tablet (81 mg total) by mouth daily.    . ASSURE COMFORT LANCETS 30G MISC 1 each by Other route daily.    [name] atorvastatin (LIPITOR) 10 MG tablet TAKE 1 TABLET BY MOUTH ONCE A DAY 90 tablet 3  . betamethasone dipropionate (DIPROLENE) 0.05 % ointment APPLY TO THE OUTSIDE OF EAR  DAILY FOR ONE WEEK MAXIMUM 30 g 1  . calcium-vitamin D (OSCAL WITH D) 500-200 MG-UNIT per tablet Take 1 tablet by mouth daily.    [name] glimepiride (AMARYL) 4 MG tablet TAKE ONE & ONE-HALF TABLETS BY MOUTH ONCE DAILY 90 tablet 3  . glucose blood (ACCU-CHEK AVIVA PLUS) test strip Test blood glucose daily. 100 each 12  . glucose blood test strip One Touch Ultra Test Strips 100 each 12  . isosorbide mononitrate (IMDUR) 30 MG 24 hr tablet TAKE THREE TABLETS BY MOUTH ONCE DAILY 180 tablet 3  . lisinopril (PRINIVIL,ZESTRIL) 5 MG tablet TAKE ONE-HALF TABLET BY MOUTH ONCE DAILY 30 tablet 5  . metFORMIN (GLUCOPHAGE) 500 MG tablet Take 0.5 tablets (250 mg total) by mouth daily with breakfast. 90 tablet 3  . metoprolol succinate (TOPROL-XL) 50 MG 24 hr tablet TAKE ONE TABLET BY MOUTH ONCE DAILY. TAKE WITH OR IMMEDIATELY FOLLOWING A MEAL 90 tablet 2  . nitroGLYCERIN (NITROSTAT) 0.4 MG SL tablet Place 1 tablet (0.4 mg total) under the tongue every 5 (five) minutes as needed for chest pain. 25 tablet 1  . Omega-3 Fatty Acids (FISH OIL PO) Take 2 capsules by mouth 2 (two) times daily.    [name] (SYSTANE OP) Place 1 drop into both eyes 4 (four) times daily. Itchy/dry eye    . potassium citrate (UROCIT-K) 10 MEQ (1080 MG) SR tablet  Take 10 mEq by mouth Twice daily.    [name] RANEXA 500 MG 12 hr tablet TAKE ONE TABLET BY MOUTH TWICE DAILY PLEASE  CALL  AND  SCHEDULE  A  ONE  YEAR  FOLLOW  UP  APPOINTMENT  FOR  FURTHER  REFILLS 30 tablet 0  . ranitidine (ZANTAC) 300 MG tablet TAKE ONE TABLET BY MOUTH TWICE DAILY 180 tablet 1  . SYMBICORT 160-4.5 MCG/ACT inhaler INHALE TWO PUFFS BY MOUTH TWICE DAILY 1 Inhaler 10  . traMADol (ULTRAM) 50 MG tablet TAKE ONE TABLET BY MOUTH EVERY 6 HOURS AS NEEDED 90 tablet 0  . traZODone (DESYREL) 150 MG tablet TAKE ONE TABLET BY MOUTH ONCE DAILY 90 tablet 2  . zinc sulfate 220 MG capsule Take 220 mg by mouth daily.     No current facility-administered medications for  this visit.     Objective: BP 132/70 (BP Location: Left Arm, Patient Position: Sitting, Cuff Size: Large)   Pulse 68   Temp 98 F (36.7 C) (Oral)   Ht 5\' 9"  (1.753 m)   Wt 190 lb 6.4 oz (86.4 kg)   SpO2 97%   BMI 28.12 kg/m  Gen: NAD, resting comfortably CV: RRR no murmurs rubs or gallops Lungs: CTAB no crackles, wheeze, rhonchi Abdomen: soft/nontender/nondistended/normal bowel sounds. No rebound or guarding.  Ext: no edema Skin: warm, dry, no rash  Assessment/Plan:   Type II diabetes mellitus with renal manifestations ([place]) S: well controlled. On last check with amaryl 6mg , metformin 250mg  daily (GFR 30-40 range). No changes last visit due to excellent control CBGs- 80s up to 140 peak- up some from last visit.  Lab Results  Component Value Date   HGBA1C 6.0 01/03/2016   HGBA1C 7.2 (H) 09/27/2015   HGBA1C 7.4 (H) 06/28/2015   A/P:  update a1c today. Needs eye exam updated- he states had within a year, get records. Foot exam updated    CKD (chronic kidney disease), stage III S: GFR has remained stable between 30-40. Watching closely due to metformin. On lisinopril in case proteinuric.  A/P: update bmet today  Thrombocytopenia ([place]) Monitor with cbc today- has been stable for most part- if worsens consider hematology- does have some anemia  Coronary artery disease with angina pectoris ([place]) S: has done really well with ranexa and imdur- has not had to take nitroglycerin since being on ranexa he states. Nonobstructive CAD on cath 2013- plan medical management A/P: continue current therapy with asa, atorvastatin, metoprolol, ranexa, imdur. Glad not using nitroglycerin but has on hand.    Hypertension S: remains controlled on imdur 90mg , lisinopril 5mg , metoprolol 50mg  XL.  BP Readings from Last 3 Encounters:  04/17/16 132/70  02/03/16 140/70  01/03/16 138/78  A/P:Continue current medications  Return in about 4 months (around 08/15/2016) for physical.  Orders Placed  This Encounter  Procedures  . CBC with Differential/Platelet  . Comprehensive metabolic panel    [place]    Order Specific Question:   Has the patient fasted?    Answer:   No  . Lipid panel    [place]    Order Specific Question:   Has the patient fasted?    Answer:   No  . Hemoglobin A1c    [place]  . Pathologist smear review   Return precautions advised.  [name], MD

## 2016-04-17 NOTE — Progress Notes (Signed)
Pre visit review using our clinic review tool, if applicable. No additional management support is needed unless otherwise documented below in the visit note. 

## 2016-04-17 NOTE — Assessment & Plan Note (Signed)
Monitor with cbc today- has been stable for most part- if worsens consider hematology- does have some anemia

## 2016-04-18 LAB — PATHOLOGIST SMEAR REVIEW

## 2016-04-20 ENCOUNTER — Encounter: Payer: Self-pay | Admitting: *Deleted

## 2016-04-23 DIAGNOSIS — R351 Nocturia: Secondary | ICD-10-CM | POA: Diagnosis not present

## 2016-04-23 DIAGNOSIS — N200 Calculus of kidney: Secondary | ICD-10-CM | POA: Diagnosis not present

## 2016-04-23 DIAGNOSIS — N401 Enlarged prostate with lower urinary tract symptoms: Secondary | ICD-10-CM | POA: Diagnosis not present

## 2016-04-27 ENCOUNTER — Other Ambulatory Visit: Payer: Self-pay | Admitting: Cardiology

## 2016-04-27 ENCOUNTER — Other Ambulatory Visit: Payer: Self-pay | Admitting: Family Medicine

## 2016-04-27 DIAGNOSIS — I10 Essential (primary) hypertension: Secondary | ICD-10-CM

## 2016-04-27 DIAGNOSIS — E785 Hyperlipidemia, unspecified: Secondary | ICD-10-CM

## 2016-05-02 ENCOUNTER — Ambulatory Visit (INDEPENDENT_AMBULATORY_CARE_PROVIDER_SITE_OTHER): Payer: PPO | Admitting: Cardiology

## 2016-05-02 ENCOUNTER — Encounter (INDEPENDENT_AMBULATORY_CARE_PROVIDER_SITE_OTHER): Payer: Self-pay

## 2016-05-02 ENCOUNTER — Encounter: Payer: Self-pay | Admitting: Cardiology

## 2016-05-02 ENCOUNTER — Telehealth: Payer: Self-pay | Admitting: *Deleted

## 2016-05-02 VITALS — BP 142/70 | HR 62 | Ht 69.5 in | Wt 192.0 lb

## 2016-05-02 DIAGNOSIS — E7800 Pure hypercholesterolemia, unspecified: Secondary | ICD-10-CM

## 2016-05-02 DIAGNOSIS — I25119 Atherosclerotic heart disease of native coronary artery with unspecified angina pectoris: Secondary | ICD-10-CM

## 2016-05-02 DIAGNOSIS — G4733 Obstructive sleep apnea (adult) (pediatric): Secondary | ICD-10-CM

## 2016-05-02 DIAGNOSIS — I10 Essential (primary) hypertension: Secondary | ICD-10-CM

## 2016-05-02 MED ORDER — RANOLAZINE ER 500 MG PO TB12: 500.0000 mg | ORAL_TABLET | Freq: Two times a day (BID) | ORAL | 11 refills | Status: DC

## 2016-05-02 NOTE — Progress Notes (Signed)
Cardiology Office Note    Date:  05/02/2016   ID:  Roy [name], DOB [birth year], MRN [id number]  PCP:  Roy Reddish, MD  Cardiologist:  Roy Him, MD   Chief Complaint  Patient presents with  . Coronary Artery Disease  . Hypertension  . Hyperlipidemia    History of Present Illness:  Roy [name] is a 83 y.o. male with history of CAD s/p PTCA D1 in 1999, CKD, diabetes, HTN, OSA on CPAPand hyperlipidemia presents for cardiology followup. He was followed by Dr. Aundra Dubin in the past.   In the fall of 2013, he developed exertional chest pain. ETT-myoview in 10/13 showed ischemic ECG changes but no TID and only a small fixed basal inferior defect with normal wall motion and EF 65%.   This was overall a low risk study.  Echo in 10/13 showed EF 55-60% with no major abnormalities.  Given ongoing symptoms, he underwent cath showing nonobstructive CAD.  Exertional chest pain thought to be due to small vessel disease.  PFTs showed only mild obstructive airways disease. He was started on ranolazine and his symptoms resolved.  He is here today for followup.   He denies any chest pain or pressure.  He is active, doing yardwork, hunting  and training his dogs.  He has not had any exertional dyspnea, PND or orthopnea.  He denies any LE edema or claudication.  He has been diagnosed with asthma and is now using inhalers.  He has not had any palpitations.  He is doing well with his CPAP device.  He tolerates his full face mask and feels the pressure is adequate.  He has some mild mouth dryness and uses nasal saline spray for nasal dryness.  He feel rested in the am and has no daytime sleepiness.      Past Medical History:  Diagnosis Date  . Anginal pain ([place])   . Arthritis    spine  . Asthma   . CAD (coronary artery disease)    s/p PCI of the D1 with cath in 2013 showing nonobstructive disease.  CP presumed due to small vessel disease.  [name] CAP (community acquired pneumonia) 12/26/2011  . Chronic  kidney disease    renal insufficiency  . Diabetes mellitus, type 2 ([place])   . Fever 12/26/2011  . GERD (gastroesophageal reflux disease)   . Heart murmur   . Hyperlipidemia   . Hypertension   . Lyme disease    states had shakes that were thought to be parkinson's related but related to tick bite, states also RMSF  . Myocardial infarction   . Shortness of breath   . Sleep apnea    uses cpap    Past Surgical History:  Procedure Laterality Date  . APPENDECTOMY    . CERVICAL LAMINECTOMY    . CHOLECYSTECTOMY    . KIDNEY STONE SURGERY    . LEFT HEART CATH N/A 02/15/2012   Procedure: LEFT HEART CATH;  Surgeon: Larey Dresser, MD;  Location[facility] CATH LAB;  Service: Cardiovascular;  Laterality: N/A;    Current Medications: Current Meds  Medication Sig  . acetaminophen (TYLENOL) 325 MG tablet Take 650 mg by mouth every evening. Reported on 06/28/2015  . albuterol (PROVENTIL HFA;VENTOLIN HFA) 108 (90 Base) MCG/ACT inhaler Inhale 2 puffs into the lungs every 4 (four) hours as needed for wheezing or shortness of breath.  [name] aspirin EC 81 MG tablet Take 1 tablet (81 mg total) by mouth daily.  [name] atorvastatin (LIPITOR) 10  MG tablet TAKE ONE TABLET BY MOUTH ONCE DAILY  . betamethasone dipropionate (DIPROLENE) 0.05 % ointment APPLY TO THE OUTSIDE OF EAR DAILY FOR ONE WEEK MAXIMUM  . calcium-vitamin D (OSCAL WITH D) 500-200 MG-UNIT per tablet Take 1 tablet by mouth daily.  [name] glimepiride (AMARYL) 4 MG tablet TAKE ONE & ONE-HALF TABLETS BY MOUTH ONCE DAILY  . isosorbide mononitrate (IMDUR) 30 MG 24 hr tablet TAKE THREE TABLETS BY MOUTH ONCE DAILY  . lisinopril (PRINIVIL,ZESTRIL) 5 MG tablet TAKE ONE-HALF TABLET BY MOUTH ONCE DAILY  . metFORMIN (GLUCOPHAGE) 500 MG tablet Take 0.5 tablets (250 mg total) by mouth daily with breakfast.  . metoprolol succinate (TOPROL-XL) 50 MG 24 hr tablet TAKE ONE TABLET BY MOUTH ONCE DAILY. TAKE WITH OR IMMEDIATELY FOLLOWING A MEAL  . nitroGLYCERIN (NITROSTAT) 0.4 MG  SL tablet Place 1 tablet (0.4 mg total) under the tongue every 5 (five) minutes as needed for chest pain.  . Omega-3 Fatty Acids (FISH OIL PO) Take 2 capsules by mouth 2 (two) times daily.  [name] (SYSTANE OP) Place 1 drop into both eyes 4 (four) times daily. Itchy/dry eye  . potassium citrate (UROCIT-K) 10 MEQ (1080 MG) SR tablet Take 10 mEq by mouth Twice daily.  . ranitidine (ZANTAC) 300 MG tablet TAKE ONE TABLET BY MOUTH TWICE DAILY  . ranolazine (RANEXA) 500 MG 12 hr tablet Take 1 tablet (500 mg total) by mouth 2 (two) times daily. *Please keep 05/02/16 appointment for further refills*  . SYMBICORT 160-4.5 MCG/ACT inhaler INHALE TWO PUFFS BY MOUTH TWICE DAILY  . traMADol (ULTRAM) 50 MG tablet TAKE ONE TABLET BY MOUTH EVERY 6 HOURS AS NEEDED  . traZODone (DESYREL) 150 MG tablet TAKE ONE TABLET BY MOUTH ONCE DAILY  . zinc sulfate 220 MG capsule Take 220 mg by mouth daily.    Allergies:   Codeine sulfate and Cephalexin   Social History   Social History  . Marital status: Married    Spouse name: N/A  . Number of children: N/A  . Years of education: N/A   Social History Main Topics  . Smoking status: Former Smoker    Packs/day: 1.50    Years: 8.00    Types: Cigarettes    Quit date: 03/05/1958  . Smokeless tobacco: Never Used  . Alcohol use No  . Drug use: No  . Sexual activity: Not Currently   Other Topics Concern  . None   Social History Narrative   Married (wife patient of Dr. Yong Channel). 3 children. 4 grandchildren. 3 greatgrandchildren.       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   ROS:   Please see the history of present illness.    ROS All other systems reviewed and are negative.  No flowsheet data found.     PHYSICAL EXAM:   VS:  BP (!) 142/70   Pulse 62   Ht  5' 9.5" (1.765 m)   Wt 192 lb (87.1 kg)   BMI 27.95 kg/m    GEN: Well nourished, well developed, in no acute distress  HEENT: normal  Neck: no JVD, carotid bruits, or masses Cardiac: RRR; no murmurs, rubs, or gallops,no edema.  Intact distal pulses bilaterally.  Respiratory:  clear to auscultation bilaterally, normal work of breathing GI: soft, nontender, nondistended, +  BS MS: no deformity or atrophy  Skin: warm and dry, no rash Neuro:  Alert and Oriented x 3, Strength and sensation are intact Psych: euthymic mood, full affect  Wt Readings from Last 3 Encounters:  05/02/16 192 lb (87.1 kg)  04/17/16 190 lb 6.4 oz (86.4 kg)  02/03/16 190 lb 5 oz (86.3 kg)      Studies/Labs Reviewed:   EKG:  EKG is  ordered today.  The ekg ordered today demonstrates NSR at 62bpm with nonspecific ST abnormality.   Recent Labs: 04/17/2016: ALT 18; BUN 29; Creatinine, Ser 1.74; Hemoglobin 12.6; Platelets 172.0; Potassium 4.4; Sodium 139   Lipid Panel    Component Value Date/Time   CHOL 117 04/17/2016 1002   TRIG 140.0 04/17/2016 1002   TRIG 205 (HH) 12/17/2005 1045   HDL 32.60 (L) 04/17/2016 1002   CHOLHDL 4 04/17/2016 1002   VLDL 28.0 04/17/2016 1002   LDLCALC 57 04/17/2016 1002   LDLDIRECT 45.0 09/27/2015 0951    Additional studies/ records that were reviewed today include:  none    ASSESSMENT:    1. Coronary artery disease involving native coronary artery of native heart with angina pectoris ([place])   2. Benign essential HTN   3. Pure hypercholesterolemia   4. Obstructive sleep apnea syndrome      PLAN:  In order of problems listed above:  1. CAD: Nonobstructive CAD on 12/13 cath.  CP symptoms in the past likely due to small vessel disease, which could explain the ischemic ECG response to exercise with only mild perfusion abnormality.  He has done quite well on ranolazine and is not having angina.  Continue statin, ASA 81, Toprol XL, ranolazine, and Imdur.   QTc normal on  ranolazine today.  2. HTN: BP controlled on current meds.  He will continue on Toprol and ACE I.  Creatinine stable at 1.74. 3. Hyperlipidemia: LDL was 57 on 04/17/2016.  He will continue on statin therapy.   4. OSA - the patient is tolerating PAP therapy well without any problems.  The patient has been using and benefiting from CPAP use and will continue to benefit from therapy. [facility] so we will place the referral.  I will get a D/L from [place].       Medication Adjustments/Labs and Tests Ordered: Current medicines are reviewed at length with the patient today.  Concerns regarding medicines are outlined above.  Medication changes, Labs and Tests ordered today are listed in the Patient Instructions below.  There are no Patient Instructions on file for this visit.   Signed, Roy Him, MD  05/02/2016 8:28 AM    [address], [place], [place]  [address] Phone: [phone]; Fax: [phone]

## 2016-05-02 NOTE — Telephone Encounter (Signed)
-----   Message from Theodoro Parma, RN sent at 05/02/2016  8:54 AM EST ----- Regarding: [employer] download Please get download from [place]  Thanks!

## 2016-05-02 NOTE — Addendum Note (Signed)
Addended by: Harland German A on: 05/02/2016 09:30 AM   Modules accepted: Orders

## 2016-05-02 NOTE — Telephone Encounter (Signed)
Called and talked to [place] at [place] who will fax the download over today

## 2016-05-02 NOTE — Patient Instructions (Signed)
Medication Instructions:  Your physician recommends that you continue on your current medications as directed. Please refer to the Current Medication list given to you today.   Labwork: None  Testing/Procedures: None  Follow-Up: Your physician wants you to follow-up in: 1 year with Dr. Radford Pax. You will receive a reminder letter in the mail two months in advance. If you don't receive a letter, please call our office to schedule the follow-up appointment.   Any Other Special Instructions Will Be Listed Below (If Applicable). An order has been placed for you to establish with [name] for supplies.   If you need a refill on your cardiac medications before your next appointment, please call your pharmacy.

## 2016-05-11 ENCOUNTER — Other Ambulatory Visit: Payer: Self-pay | Admitting: Family Medicine

## 2016-05-11 NOTE — Telephone Encounter (Signed)
Sorry, wrong message routed.

## 2016-05-11 NOTE — Telephone Encounter (Signed)
Please investigate- unclear if anything needed from [place].

## 2016-05-30 ENCOUNTER — Telehealth: Payer: Self-pay | Admitting: Family Medicine

## 2016-05-30 NOTE — Telephone Encounter (Signed)
Pts wife calling pt need to have Rx Tramadol resent to Pharm:  [place], [place]

## 2016-05-31 NOTE — Telephone Encounter (Signed)
Prescription refaxed.

## 2016-05-31 NOTE — Telephone Encounter (Signed)
Pt wife is aware refills can take up to 3 business day

## 2016-06-04 ENCOUNTER — Telehealth: Payer: Self-pay | Admitting: Family Medicine

## 2016-06-04 NOTE — Telephone Encounter (Signed)
° ° °  Pharmacy call to say that the below med rx did not come through. She ask that it be resent    traMADol (ULTRAM) 50 MG tablet   [facility]  [place]

## 2016-06-05 NOTE — Telephone Encounter (Signed)
Pt came in on 06/05/16 at 1:15pm to pick up paper script.

## 2016-06-14 ENCOUNTER — Telehealth: Payer: Self-pay | Admitting: Pulmonary Disease

## 2016-06-14 MED ORDER — BUDESONIDE-FORMOTEROL FUMARATE 160-4.5 MCG/ACT IN AERO: 2.0000 | INHALATION_SPRAY | Freq: Two times a day (BID) | RESPIRATORY_TRACT | 0 refills | Status: DC

## 2016-06-14 NOTE — Telephone Encounter (Signed)
Pt requesting symbicort samples.  These have been left up front.  Nothing further needed.

## 2016-07-14 ENCOUNTER — Other Ambulatory Visit: Payer: Self-pay | Admitting: Family Medicine

## 2016-07-14 DIAGNOSIS — E119 Type 2 diabetes mellitus without complications: Secondary | ICD-10-CM

## 2016-07-21 ENCOUNTER — Other Ambulatory Visit: Payer: Self-pay | Admitting: Family Medicine

## 2016-08-07 ENCOUNTER — Ambulatory Visit (INDEPENDENT_AMBULATORY_CARE_PROVIDER_SITE_OTHER): Payer: PPO | Admitting: Family Medicine

## 2016-08-07 ENCOUNTER — Encounter: Payer: Self-pay | Admitting: Family Medicine

## 2016-08-07 VITALS — BP 128/68 | HR 64 | Temp 97.8°F | Ht 69.0 in | Wt 191.6 lb

## 2016-08-07 DIAGNOSIS — I25119 Atherosclerotic heart disease of native coronary artery with unspecified angina pectoris: Secondary | ICD-10-CM | POA: Diagnosis not present

## 2016-08-07 DIAGNOSIS — E1122 Type 2 diabetes mellitus with diabetic chronic kidney disease: Secondary | ICD-10-CM | POA: Diagnosis not present

## 2016-08-07 DIAGNOSIS — N183 Chronic kidney disease, stage 3 unspecified: Secondary | ICD-10-CM

## 2016-08-07 DIAGNOSIS — E7800 Pure hypercholesterolemia, unspecified: Secondary | ICD-10-CM | POA: Diagnosis not present

## 2016-08-07 DIAGNOSIS — Z0000 Encounter for general adult medical examination without abnormal findings: Secondary | ICD-10-CM

## 2016-08-07 DIAGNOSIS — D649 Anemia, unspecified: Secondary | ICD-10-CM | POA: Diagnosis not present

## 2016-08-07 DIAGNOSIS — D696 Thrombocytopenia, unspecified: Secondary | ICD-10-CM

## 2016-08-07 LAB — CBC WITH DIFFERENTIAL/PLATELET
BASOS PCT: 0.5 % (ref 0.0–3.0)
Basophils Absolute: 0 10*3/uL (ref 0.0–0.1)
EOS ABS: 0.7 10*3/uL (ref 0.0–0.7)
EOS PCT: 9.5 % — AB (ref 0.0–5.0)
HCT: 37.6 % — ABNORMAL LOW (ref 39.0–52.0)
HEMOGLOBIN: 12.8 g/dL — AB (ref 13.0–17.0)
Lymphocytes Relative: 14.8 % (ref 12.0–46.0)
Lymphs Abs: 1.1 10*3/uL (ref 0.7–4.0)
MCHC: 33.9 g/dL (ref 30.0–36.0)
MCV: 86.7 fl (ref 78.0–100.0)
MONO ABS: 0.6 10*3/uL (ref 0.1–1.0)
Monocytes Relative: 7.6 % (ref 3.0–12.0)
NEUTROS ABS: 5.1 10*3/uL (ref 1.4–7.7)
Neutrophils Relative %: 67.6 % (ref 43.0–77.0)
PLATELETS: 138 10*3/uL — AB (ref 150.0–400.0)
RBC: 4.33 Mil/uL (ref 4.22–5.81)
RDW: 14.4 % (ref 11.5–15.5)
WBC: 7.6 10*3/uL (ref 4.0–10.5)

## 2016-08-07 LAB — COMPREHENSIVE METABOLIC PANEL
ALK PHOS: 66 U/L (ref 39–117)
ALT: 15 U/L (ref 0–53)
AST: 15 U/L (ref 0–37)
Albumin: 4.4 g/dL (ref 3.5–5.2)
BILIRUBIN TOTAL: 0.7 mg/dL (ref 0.2–1.2)
BUN: 33 mg/dL — AB (ref 6–23)
CO2: 29 meq/L (ref 19–32)
CREATININE: 2.05 mg/dL — AB (ref 0.40–1.50)
Calcium: 9.2 mg/dL (ref 8.4–10.5)
Chloride: 102 mEq/L (ref 96–112)
GFR: 33.23 mL/min — ABNORMAL LOW (ref >60.00)
Glucose, Bld: 149 mg/dL — ABNORMAL HIGH (ref 70–99)
Potassium: 4.5 mEq/L (ref 3.5–5.1)
SODIUM: 138 meq/L (ref 135–145)
TOTAL PROTEIN: 6.4 g/dL (ref 6.0–8.3)

## 2016-08-07 LAB — FERRITIN: Ferritin: 56.2 ng/mL (ref 22.0–322.0)

## 2016-08-07 LAB — HEMOGLOBIN A1C: Hgb A1c MFr Bld: 7.1 % — ABNORMAL HIGH (ref 4.6–6.5)

## 2016-08-07 NOTE — Progress Notes (Signed)
Phone: [phone]  Subjective:  Patient presents today for their annual physical. Chief complaint-noted.   See problem oriented charting- ROS- full  review of systems was completed and negative except including No chest pain or shortness of breath. No headache or blurry vision.   The following were reviewed and entered/updated in epic: Past Medical History:  Diagnosis Date  . Anginal pain ([place])   . Arthritis    spine  . Asthma   . CAD (coronary artery disease)    s/p PCI of the D1 with cath in 2013 showing nonobstructive disease.  CP presumed due to small vessel disease.  [name] CAP (community acquired pneumonia) 12/26/2011  . Chronic kidney disease    renal insufficiency  . Diabetes mellitus, type 2 ([place])   . Fever 12/26/2011  . GERD (gastroesophageal reflux disease)   . Heart murmur   . Hyperlipidemia   . Hypertension   . Lyme disease    states had shakes that were thought to be parkinson's related but related to tick bite, states also RMSF  . Myocardial infarction ([place])   . Shortness of breath   . Sleep apnea    uses cpap   Patient Active Problem List   Diagnosis Date Noted  . Type II diabetes mellitus with renal manifestations ([place]) 09/11/2006    Priority: High  . Coronary artery disease with angina pectoris ([place]) 09/11/2006    Priority: High  . Thrombocytopenia ([place]) 02/23/2015    Priority: Medium  . Mild persistent asthma 10/30/2010    Priority: Medium  . CKD (chronic kidney disease), stage III 02/22/2009    Priority: Medium  . Hyperlipidemia 09/11/2006    Priority: Medium  . Essential hypertension 09/11/2006    Priority: Medium  . Back pain 09/11/2006    Priority: Medium  . Sleep apnea 09/11/2006    Priority: Medium  . GERD (gastroesophageal reflux disease) 05/03/2014    Priority: Low  . Insomnia 05/03/2014    Priority: Low  . Allergic rhinitis 04/21/2014    Priority: Low  . Giardia 04/21/2014    Priority: Low  . NEPHROLITHIASIS, RECURRENT 01/21/2009     Priority: Low  . ACTINIC KERATOSIS, HEAD 06/14/2008    Priority: Low   Past Surgical History:  Procedure Laterality Date  . APPENDECTOMY    . CERVICAL LAMINECTOMY    . CHOLECYSTECTOMY    . KIDNEY STONE SURGERY    . LEFT HEART CATH N/A 02/15/2012   Procedure: LEFT HEART CATH;  Surgeon: Larey Dresser, MD;  Location: [facility] CATH LAB;  Service: Cardiovascular;  Laterality: N/A;    Family History  Problem Relation Age of Onset  . ALS Father   . Asthma Daughter   . Cancer Sister        breast/liver    Medications- reviewed and updated Current Outpatient Prescriptions  Medication Sig Dispense Refill  . acetaminophen (TYLENOL) 325 MG tablet Take 650 mg by mouth every evening. Reported on 06/28/2015    . albuterol (PROVENTIL HFA;VENTOLIN HFA) 108 (90 Base) MCG/ACT inhaler Inhale 2 puffs into the lungs every 4 (four) hours as needed for wheezing or shortness of breath. 1 Inhaler 10  . aspirin EC 81 MG tablet Take 1 tablet (81 mg total) by mouth daily.    . ASSURE COMFORT LANCETS 30G MISC 1 each by Other route daily.    [name] atorvastatin (LIPITOR) 10 MG tablet TAKE ONE TABLET BY MOUTH ONCE DAILY 90 tablet 3  . betamethasone dipropionate (DIPROLENE) 0.05 % ointment APPLY TO  THE OUTSIDE OF EAR DAILY FOR ONE WEEK MAXIMUM 30 g 1  . budesonide-formoterol (SYMBICORT) 160-4.5 MCG/ACT inhaler Inhale 2 puffs into the lungs 2 (two) times daily. 2 Inhaler 0  . calcium-vitamin D (OSCAL WITH D) 500-200 MG-UNIT per tablet Take 1 tablet by mouth daily.    [name] glimepiride (AMARYL) 4 MG tablet TAKE ONE & ONE-HALF TABLETS BY MOUTH ONCE DAILY 90 tablet 3  . glucose blood (ACCU-CHEK AVIVA PLUS) test strip Test blood glucose daily. 100 each 12  . glucose blood test strip One Touch Ultra Test Strips 100 each 12  . isosorbide mononitrate (IMDUR) 30 MG 24 hr tablet TAKE THREE TABLETS BY MOUTH ONCE DAILY 180 tablet 3  . lisinopril (PRINIVIL,ZESTRIL) 5 MG tablet TAKE ONE-HALF TABLET BY MOUTH ONCE DAILY 30 tablet 5  .  metFORMIN (GLUCOPHAGE) 500 MG tablet Take 0.5 tablets (250 mg total) by mouth daily with breakfast. 90 tablet 3  . metoprolol succinate (TOPROL-XL) 50 MG 24 hr tablet TAKE ONE TABLET BY MOUTH ONCE DAILY. TAKE WITH OR IMMEDIATELY FOLLOWING A MEAL 90 tablet 2  . nitroGLYCERIN (NITROSTAT) 0.4 MG SL tablet Place 1 tablet (0.4 mg total) under the tongue every 5 (five) minutes as needed for chest pain. 25 tablet 1  . Omega-3 Fatty Acids (FISH OIL PO) Take 2 capsules by mouth 2 (two) times daily.    [name] (SYSTANE OP) Place 1 drop into both eyes 4 (four) times daily. Itchy/dry eye    . potassium citrate (UROCIT-K) 10 MEQ (1080 MG) SR tablet Take 10 mEq by mouth Twice daily.    . ranitidine (ZANTAC) 300 MG tablet TAKE ONE TABLET BY MOUTH TWICE DAILY 180 tablet 1  . ranolazine (RANEXA) 500 MG 12 hr tablet Take 1 tablet (500 mg total) by mouth 2 (two) times daily. 60 tablet 11  . traMADol (ULTRAM) 50 MG tablet TAKE ONE TABLET BY MOUTH EVERY 6 HOURS AS NEEDED 90 tablet 0  . traZODone (DESYREL) 150 MG tablet TAKE ONE TABLET BY MOUTH ONCE DAILY 90 tablet 2  . zinc sulfate 220 MG capsule Take 220 mg by mouth daily.     No current facility-administered medications for this visit.     Allergies-reviewed and updated Allergies  Allergen Reactions  . Codeine Sulfate Other (See Comments)    Chest pain  . Cephalexin Rash    Social History   Social History  . Marital status: Married    Spouse name: N/A  . Number of children: N/A  . Years of education: N/A   Social History Main Topics  . Smoking status: Former Smoker    Packs/day: 1.50    Years: 8.00    Types: Cigarettes    Quit date: 03/05/1958  . Smokeless tobacco: Never Used  . Alcohol use No  . Drug use: No  . Sexual activity: Not Currently   Other Topics Concern  . None   Social History Narrative   Married (wife patient of Dr. Yong Channel). 3 children. 4 grandchildren. 3 greatgrandchildren.       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 Oropharynx normal Neck: no thyromegaly,  no carotid bruits CV: RRR no murmurs rubs or gallops Lungs: CTAB no crackles, wheeze, rhonchi Abdomen: soft/nontender/nondistended/normal bowel sounds. No rebound or guarding.  Ext: no edema, 2+ DP pulses Skin: warm, dry Neuro: grossly normal, moves all extremities, PERRLA  Assessment/Plan:  83 y.o. male presenting for annual physical.  Health Maintenance counseling: 1. Anticipatory guidance: Patient counseled regarding regular dental exams q6 months, eye exams - yearly, wearing seatbelts.  2. Risk factor reduction:  Advised patient of need for regular exercise and diet rich and fruits and vegetables to reduce risk of heart attack and stroke. Exercise- very active for age. Diet-rather balanced diet- gets fruits/veggies/protein. Im ok with him as long as stays in overweight category.  Wt Readings from Last 3 Encounters:  08/07/16 191 lb 9.6 oz (86.9 kg)  05/02/16 192 lb (87.1 kg)  04/17/16 190 lb 6.4 oz (86.4 kg)  3. Immunizations/screenings/ancillary studies- will get records of last eye exam- had in 2017. Wants to hold off on shingrix until at least next year Immunization History  Administered Date(s) Administered  . Influenza Split 12/04/2010, 01/23/2012  . Influenza Whole 12/26/2006, 11/28/2007, 11/24/2008, 12/30/2009  . Influenza, High Dose Seasonal PF 01/13/2013, 12/10/2013  . Influenza-Unspecified 10/31/2015  . Pneumococcal Conjugate-13 07/27/2014  . Pneumococcal Polysaccharide-23 06/27/2005  . Td 06/27/2005  . Zoster 10/31/2015  4. Prostate cancer screening- has seen urology in past- they stated no more testing at this point. 04/2016 was released by  urology but he seems to want to follow up. Will get rectal exams potentially but no PSA Lab Results  Component Value Date   PSA 0.78 02/14/2009   PSA 0.80 08/13/2008   PSA 0.76 03/15/2008   5. Colon cancer screening - states had colonoscopy years ago well over 82. Agrees to stool cards every 1-2 years through 80- negative for blood in 01/2016. 6. Skin cancer screening- has august dermatology appointment  Status of chronic or acute concerns   Thrombocytopenia- monitor with CBC. Mild issue.   Discussed prior pathology report- he is asymptomatic. Doubt Hodgkin's disease. Will update CBC and ferritin per that suggestion.   CKD III- GFR in 30-40 range. On low dose ace-I with liisnopril 5mg   HTN- on imdur 90mg , lisinopril 5mg  and metoprolol 50mg  XL doing well on repeat  Asthma - on symbicort and using albuterol (not using at all- states still in box).   History of kidney stones on potassium citrate  Insomnia- trazodone at night 150mg  helps him a lot  Tramadol mainly in cold or damp weahter for joints  Type II diabetes mellitus with renal manifestations (HCC) Type II diabetes- on metformin 250mg , amaryl 6 mg. GFR in 30-40 range so watching that. Target a1c under 7.5. Update a1c today. Sugars in 80s and 90s fasting Lab Results  Component Value Date   HGBA1C 6.7 (H) 04/17/2016     Coronary artery disease with angina pectoris (HCC) CAD- on metoprolol, imdur, ranexa. No recent nitroglycerin use. Last cath 02/2012 with nonobstructive CAD- likely small vessle disease. Also on aspirin and atorvastatin  Hyperlipidemia Hyperlipidemia- desire LDL under 70 and was 57 in february. Continue atorvastatin   Return in about 4 months (around 12/07/2016) for follow up- or sooner if needed.  Orders Placed This Encounter  Procedures  . Comprehensive metabolic panel    [place]  . CBC    [place]  . Hemoglobin A1c      . Ferritin   Return precautions advised.  [name],  MD

## 2016-08-07 NOTE — Assessment & Plan Note (Signed)
Type II diabetes- on metformin 250mg , amaryl 6 mg. GFR in 30-40 range so watching that. Target a1c under 7.5. Update a1c today. Sugars in 80s and 90s fasting Lab Results  Component Value Date   HGBA1C 6.7 (H) 04/17/2016

## 2016-08-07 NOTE — Patient Instructions (Addendum)
Please stop by lab before you go  No changes today unless labs lead [place] to make changes

## 2016-08-07 NOTE — Assessment & Plan Note (Signed)
CAD- on metoprolol, imdur, ranexa. No recent nitroglycerin use. Last cath 02/2012 with nonobstructive CAD- likely small vessle disease. Also on aspirin and atorvastatin

## 2016-08-07 NOTE — Assessment & Plan Note (Signed)
Hyperlipidemia- desire LDL under 70 and was 57 in february. Continue atorvastatin

## 2016-08-09 ENCOUNTER — Other Ambulatory Visit: Payer: Self-pay | Admitting: Family Medicine

## 2016-09-11 ENCOUNTER — Encounter: Payer: PPO | Admitting: Family Medicine

## 2016-10-29 ENCOUNTER — Ambulatory Visit: Payer: PPO | Admitting: Pulmonary Disease

## 2016-10-30 ENCOUNTER — Ambulatory Visit (INDEPENDENT_AMBULATORY_CARE_PROVIDER_SITE_OTHER): Payer: PPO | Admitting: Pulmonary Disease

## 2016-10-30 ENCOUNTER — Encounter: Payer: Self-pay | Admitting: Pulmonary Disease

## 2016-10-30 VITALS — BP 134/66 | HR 63 | Ht 69.0 in | Wt 192.0 lb

## 2016-10-30 DIAGNOSIS — J4530 Mild persistent asthma, uncomplicated: Secondary | ICD-10-CM | POA: Diagnosis not present

## 2016-10-30 MED ORDER — BUDESONIDE-FORMOTEROL FUMARATE 160-4.5 MCG/ACT IN AERO: 2.0000 | INHALATION_SPRAY | Freq: Two times a day (BID) | RESPIRATORY_TRACT | 0 refills | Status: DC

## 2016-10-30 NOTE — Patient Instructions (Signed)
For your asthma: Keep taking Symbicort as you are doing Get a high-dose flu shot when they become available We will see you back in one year or sooner if needed

## 2016-10-30 NOTE — Addendum Note (Signed)
Addended by: Maryanna Shape A on: 10/30/2016 02:25 PM   Modules accepted: Orders

## 2016-10-30 NOTE — Progress Notes (Signed)
Subjective:    Patient ID: Roy [name], male    DOB: September 24, 1932, 83 y.o.   MRN: [id number]  Synopsis Chronic obstructive asthma, minimal smoking history.  Only smoked about 10 years in the 1950's.   HPI Chief Complaint  Patient presents with  . Follow-up    pt doing well, no complaints at this time.     Ed has been doing well.  Roy [name] says that Roy [name] has been active working in his garden.  Roy [name] has been doingwell.  Roy [name] is about to celebrate his 62th anniversary.  Roy [name] feels like his breathing is OK. Roy [name] continues to take Symbicort.  The medicine is expensive because Roy [name] is currently in the donut hole for medication cost.     Past Medical History:  Diagnosis Date  . Anginal pain ([place])   . Arthritis    spine  . Asthma   . CAD (coronary artery disease)    s/p PCI of the D1 with cath in 2013 showing nonobstructive disease.  CP presumed due to small vessel disease.  [name] CAP (community acquired pneumonia) 12/26/2011  . Chronic kidney disease    renal insufficiency  . Diabetes mellitus, type 2 ([place])   . Fever 12/26/2011  . GERD (gastroesophageal reflux disease)   . Heart murmur   . Hyperlipidemia   . Hypertension   . Lyme disease    states had shakes that were thought to be parkinson's related but related to tick bite, states also RMSF  . Myocardial infarction ([place])   . Shortness of breath   . Sleep apnea    uses cpap      Review of Systems     Objective:   Physical Exam  Vitals:   10/30/16 1411  BP: 134/66  Pulse: 63  SpO2: 97%  Weight: 192 lb (87.1 kg)  Height: 5\' 9"  (1.753 m)   RA  Gen: well appearing HENT: OP clear, TM's clear, neck supple PULM: CTA B, normal percussion CV: RRR, no mgr, trace edema GI: BS+, soft, nontender Derm: no cyanosis or rash Psyche: normal mood and affect   PFT 07/13/2014 pulmonary function testing> ratio 69%, FEV1 2.51 L (87% predicted, obstruction resolved after bronchodilator and FEV1 increased to 2.58 L, 2% change with  bronchodilator Total lung capacity 5.81 L (82% predicted) DLCO 20.14 (62% predicted).  Chest imaging: 07/2014 CT chest ([place]) > no ILD or bronchiectasis     Assessment & Plan:   Mild persistent asthma, uncomplicated  Discussion: This is been a stable interval for Roy [name]. Roy [name] has not had an exacerbation since the last visit. Roy [name] is compliant with his Symbicort and has no side effects from it. Roy [name] says it continues to help him.  Plan: For your asthma: Keep taking Symbicort as you are doing Get a high-dose flu shot when they become available We will see you back in one year or sooner if needed    Current Outpatient Prescriptions:  .  acetaminophen (TYLENOL) 325 MG tablet, Take 650 mg by mouth every evening. Reported on 06/28/2015, Disp: , Rfl:  .  albuterol (PROVENTIL HFA;VENTOLIN HFA) 108 (90 Base) MCG/ACT inhaler, Inhale 2 puffs into the lungs every 4 (four) hours as needed for wheezing or shortness of breath., Disp: 1 Inhaler, Rfl: 10 .  aspirin EC 81 MG tablet, Take 1 tablet (81 mg total) by mouth daily., Disp: , Rfl:  .  ASSURE COMFORT LANCETS 30G MISC, 1 each by Other route daily., Disp: , Rfl:  .  atorvastatin (LIPITOR) 10 MG tablet, TAKE ONE TABLET BY MOUTH ONCE DAILY, Disp: 90 tablet, Rfl: 3 .  betamethasone dipropionate (DIPROLENE) 0.05 % ointment, APPLY TO THE OUTSIDE OF EAR DAILY FOR ONE WEEK MAXIMUM, Disp: 30 g, Rfl: 1 .  budesonide-formoterol (SYMBICORT) 160-4.5 MCG/ACT inhaler, Inhale 2 puffs into the lungs 2 (two) times daily., Disp: 2 Inhaler, Rfl: 0 .  calcium-vitamin D (OSCAL WITH D) 500-200 MG-UNIT per tablet, Take 1 tablet by mouth daily., Disp: , Rfl:  .  glimepiride (AMARYL) 4 MG tablet, TAKE ONE & ONE-HALF TABLETS BY MOUTH ONCE DAILY, Disp: 90 tablet, Rfl: 3 .  glucose blood (ACCU-CHEK AVIVA PLUS) test strip, Test blood glucose daily., Disp: 100 each, Rfl: 12 .  glucose blood test strip, One Touch Ultra Test Strips, Disp: 100 each, Rfl: 12 .  isosorbide  mononitrate (IMDUR) 30 MG 24 hr tablet, TAKE THREE TABLETS BY MOUTH ONCE DAILY, Disp: 180 tablet, Rfl: 3 .  lisinopril (PRINIVIL,ZESTRIL) 5 MG tablet, TAKE ONE-HALF TABLET BY MOUTH ONCE DAILY, Disp: 30 tablet, Rfl: 5 .  metFORMIN (GLUCOPHAGE) 500 MG tablet, Take 0.5 tablets (250 mg total) by mouth daily with breakfast., Disp: 90 tablet, Rfl: 3 .  metoprolol succinate (TOPROL-XL) 50 MG 24 hr tablet, TAKE ONE TABLET BY MOUTH ONCE DAILY. TAKE WITH OR IMMEDIATELY FOLLOWING A MEAL, Disp: 90 tablet, Rfl: 2 .  nitroGLYCERIN (NITROSTAT) 0.4 MG SL tablet, Place 1 tablet (0.4 mg total) under the tongue every 5 (five) minutes as needed for chest pain., Disp: 25 tablet, Rfl: 1 .  Omega-3 Fatty Acids (FISH OIL PO), Take 2 capsules by mouth 2 (two) times daily., Disp: , Rfl:  .  Polyethyl Glycol-Propyl Glycol (SYSTANE OP), Place 1 drop into both eyes 4 (four) times daily. Itchy/dry eye, Disp: , Rfl:  .  potassium citrate (UROCIT-K) 10 MEQ (1080 MG) SR tablet, Take 10 mEq by mouth Twice daily., Disp: , Rfl:  .  ranitidine (ZANTAC) 300 MG tablet, TAKE ONE TABLET BY MOUTH TWICE DAILY, Disp: 180 tablet, Rfl: 1 .  ranolazine (RANEXA) 500 MG 12 hr tablet, Take 1 tablet (500 mg total) by mouth 2 (two) times daily., Disp: 60 tablet, Rfl: 11 .  traMADol (ULTRAM) 50 MG tablet, TAKE ONE TABLET BY MOUTH EVERY 6 HOURS AS NEEDED, Disp: 90 tablet, Rfl: 0 .  traZODone (DESYREL) 150 MG tablet, TAKE ONE TABLET BY MOUTH ONCE DAILY, Disp: 90 tablet, Rfl: 2 .  zinc sulfate 220 MG capsule, Take 220 mg by mouth daily., Disp: , Rfl:

## 2016-11-01 ENCOUNTER — Other Ambulatory Visit: Payer: Self-pay | Admitting: Family Medicine

## 2016-11-08 ENCOUNTER — Other Ambulatory Visit: Payer: Self-pay | Admitting: Family Medicine

## 2016-11-15 ENCOUNTER — Other Ambulatory Visit: Payer: Self-pay | Admitting: Family Medicine

## 2016-11-20 ENCOUNTER — Other Ambulatory Visit: Payer: Self-pay | Admitting: Family Medicine

## 2016-12-07 ENCOUNTER — Encounter: Payer: Self-pay | Admitting: Family Medicine

## 2016-12-07 ENCOUNTER — Ambulatory Visit (INDEPENDENT_AMBULATORY_CARE_PROVIDER_SITE_OTHER): Payer: PPO | Admitting: Family Medicine

## 2016-12-07 VITALS — BP 146/80 | HR 65 | Temp 98.0°F | Ht 69.0 in | Wt 190.0 lb

## 2016-12-07 DIAGNOSIS — N183 Chronic kidney disease, stage 3 unspecified: Secondary | ICD-10-CM

## 2016-12-07 DIAGNOSIS — D696 Thrombocytopenia, unspecified: Secondary | ICD-10-CM | POA: Diagnosis not present

## 2016-12-07 DIAGNOSIS — E1122 Type 2 diabetes mellitus with diabetic chronic kidney disease: Secondary | ICD-10-CM | POA: Diagnosis not present

## 2016-12-07 DIAGNOSIS — E1159 Type 2 diabetes mellitus with other circulatory complications: Secondary | ICD-10-CM

## 2016-12-07 DIAGNOSIS — I10 Essential (primary) hypertension: Secondary | ICD-10-CM

## 2016-12-07 DIAGNOSIS — I152 Hypertension secondary to endocrine disorders: Secondary | ICD-10-CM

## 2016-12-07 LAB — CBC
HCT: 39.4 % (ref 39.0–52.0)
HEMOGLOBIN: 13.3 g/dL (ref 13.0–17.0)
MCHC: 33.8 g/dL (ref 30.0–36.0)
MCV: 88.1 fl (ref 78.0–100.0)
Platelets: 128 10*3/uL — ABNORMAL LOW (ref 150.0–400.0)
RBC: 4.47 Mil/uL (ref 4.22–5.81)
RDW: 14.3 % (ref 11.5–15.5)
WBC: 9.1 10*3/uL (ref 4.0–10.5)

## 2016-12-07 LAB — BASIC METABOLIC PANEL
BUN: 22 mg/dL (ref 6–23)
CO2: 29 meq/L (ref 19–32)
Calcium: 9.6 mg/dL (ref 8.4–10.5)
Chloride: 100 mEq/L (ref 96–112)
Creatinine, Ser: 1.79 mg/dL — ABNORMAL HIGH (ref 0.40–1.50)
GFR: 38.83 mL/min — ABNORMAL LOW (ref >60.00)
GLUCOSE: 133 mg/dL — AB (ref 70–99)
POTASSIUM: 4.2 meq/L (ref 3.5–5.1)
SODIUM: 139 meq/L (ref 135–145)

## 2016-12-07 LAB — HEMOGLOBIN A1C: Hgb A1c MFr Bld: 6.9 % — ABNORMAL HIGH (ref 4.6–6.5)
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
## 2016-12-07 NOTE — Patient Instructions (Addendum)
No changes planned unless labs lead [place] to make changes  Thanks for already getting your flu shot  Have them send [place] a copy of your eye exam   So sorry for your loss
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
## 2016-12-08 NOTE — Assessment & Plan Note (Signed)
S: thrombocytopenia mild and stable over 100 A/P:continue to check every 4-8 months

## 2016-12-08 NOTE — Assessment & Plan Note (Signed)
S: GFR in 30-40 mg on low dose ace-I in case proteinuric element A/P: GFR stable in this range today- continue to monitor every 4 months

## 2016-12-08 NOTE — Assessment & Plan Note (Signed)
S: controlled on  imdur 90mg , lisinopril 5mg , metoprolol 50mg  XL in the past.  BP Readings from Last 3 Encounters:  12/07/16 (!) 146/80  10/30/16 134/66  08/07/16 128/68  A/P: We discussed blood pressure goal of <140/90. Continue current meds:  This is the first elevation we have seen in office in recent memory. High stress right now as lost a family friend. Home #s still reported 130s over 49s. Asked him to monitor at home and if running over 140/90 to return to see [place] sooner

## 2016-12-14 ENCOUNTER — Other Ambulatory Visit: Payer: Self-pay | Admitting: Family Medicine

## 2016-12-14 ENCOUNTER — Other Ambulatory Visit: Payer: Self-pay | Admitting: *Deleted

## 2016-12-14 MED ORDER — METFORMIN HCL 500 MG PO TABS: 250.0000 mg | ORAL_TABLET | Freq: Every day | ORAL | 3 refills | Status: DC

## 2017-01-03 DIAGNOSIS — E119 Type 2 diabetes mellitus without complications: Secondary | ICD-10-CM | POA: Diagnosis not present

## 2017-01-03 DIAGNOSIS — Z7984 Long term (current) use of oral hypoglycemic drugs: Secondary | ICD-10-CM | POA: Diagnosis not present

## 2017-01-03 DIAGNOSIS — Z961 Presence of intraocular lens: Secondary | ICD-10-CM | POA: Diagnosis not present

## 2017-01-03 DIAGNOSIS — H04123 Dry eye syndrome of bilateral lacrimal glands: Secondary | ICD-10-CM | POA: Diagnosis not present

## 2017-01-03 LAB — HM DIABETES EYE EXAM

## 2017-01-08 ENCOUNTER — Encounter: Payer: Self-pay | Admitting: Family Medicine

## 2017-01-10 ENCOUNTER — Other Ambulatory Visit: Payer: Self-pay | Admitting: Family Medicine

## 2017-01-10 ENCOUNTER — Other Ambulatory Visit: Payer: Self-pay

## 2017-01-10 MED ORDER — ISOSORBIDE MONONITRATE ER 30 MG PO TB24: ORAL_TABLET | ORAL | 3 refills | Status: DC

## 2017-01-17 ENCOUNTER — Other Ambulatory Visit: Payer: Self-pay | Admitting: Family Medicine

## 2017-01-25 ENCOUNTER — Other Ambulatory Visit: Payer: Self-pay | Admitting: Family Medicine

## 2017-02-06 NOTE — Progress Notes (Signed)
PCP notes:   Health maintenance: NA. Zoster 10/2015 Educated on shingrix but may hold for now   Abnormal screenings:  Rechecked Roy [name]'s BP and was 160/80  Was instructed to check his BP 4 to 5 times a week x 2 and to make a fup apt if it remains over 140/90 (stated he had pork tenderloin for breakfast this am)  Educated on [place]  (susan will fup in 2 weeks to see fup on BP at home)    Patient concerns:  None voiced   Nurse concerns: As noted   Next PCP appt: 04/09/2017

## 2017-02-06 NOTE — Progress Notes (Signed)
Subjective:   Roy [name] is a 83 y.o. male who presents for Medicare Annual/Subsequent preventive examination.  Review of Systems:  No ROS.  Medicare Wellness Visit. Additional risk factors are reflected in the social history.  Cardiac Risk Factors include: advanced age (>51men, >62 women);diabetes mellitus;dyslipidemia;hypertension;male gender;family history of premature cardiovascular diseaseLast OV 12/07/16  States health is good this year Does a lot of walking During the winter; beagle dogs and runs rabbits  Wife broke her leg last year She did well at home. Had large bathroom, has a walk in shower    Objective:    Vitals: BP (!) 160/80   Pulse 79   Ht 5\' 9"  (1.753 m)   Wt 194 lb (88 kg)   SpO2 96%   BMI 28.65 kg/m   Body mass index is 28.65 kg/m.  Bp taken on the 2nd reading; Agreed to recheck at home over the next couple of weeks and schedule an apt if elevated   Advanced Directives 02/07/2017 02/03/2016 02/08/2015 02/14/2012 12/26/2011  Does Patient Have a Medical Advance Directive? Yes Yes No Patient does not have advance directive Patient does not have advance directive  Pre-existing out of facility DNR order (yellow form or pink MOST form) - - - No No    Tobacco Social History   Tobacco Use  Smoking Status Former Smoker  . Packs/day: 1.50  . Years: 8.00  . Pack years: 12.00  . Types: Cigarettes  . Last attempt to quit: 03/05/1958  . Years since quitting: 58.9  Smokeless Tobacco Never Used     Counseling given: Yes  Remote smoking history; 12 pack year hx;  Does not qualify for preventive screens for tobacco users   Clinical Intake:   BS running 150 at times  Lowest 80 in the am Diet Breakfast eggs and bacon, smaller portions Lunch; beans, vegetables Supper vegetables and meats No much sugar, does not like sweets   Exercise Hunting with dogs;  Has small farm - 4 brothers  Having some pain now that the weather has changed. Needs a refill on  tramadol 50mg  refilled   Patient Care Team: Marin Olp, MD as PCP - General (Family Medicine) Dr. Fransico Him; heart doctor Dr. Simonne Maffucci Lung doctor Kidney doctor retired; goes to practice beside of [facility];  Goes to [employer] and will get another doctor   Care Team updated   Past Medical History:  Diagnosis Date  . Anginal pain ([place])   . Arthritis    spine  . Asthma   . CAD (coronary artery disease)    s/p PCI of the D1 with cath in 2013 showing nonobstructive disease.  CP presumed due to small vessel disease.  [name] CAP (community acquired pneumonia) 12/26/2011  . Chronic kidney disease    renal insufficiency  . Diabetes mellitus, type 2 ([place])   . Fever 12/26/2011  . GERD (gastroesophageal reflux disease)   . Heart murmur   . Hyperlipidemia   . Hypertension   . Lyme disease    states had shakes that were thought to be parkinson's related but related to tick bite, states also RMSF  . Myocardial infarction ([place])   . Shortness of breath   . Sleep apnea    uses cpap   Past Surgical History:  Procedure Laterality Date  . APPENDECTOMY    . CERVICAL LAMINECTOMY    . CHOLECYSTECTOMY    . KIDNEY STONE SURGERY    . LEFT HEART CATH N/A 02/15/2012  Procedure: LEFT HEART CATH;  Surgeon: Larey Dresser, MD;  [facility]: [facility] C[facility] LAB;  Service: Cardiovascular;  Laterality: N/A;   Family History  Problem Relation Age of Onset  . ALS Father   . Asthma Daughter   . Cancer Sister        breast/liver   Social History   Socioeconomic History  . Marital status: Married    Spouse name: Not on file  . Number of children: Not on file  . Years of education: Not on file  . Highest education level: Not on file  Social Needs  . Financial resource strain: Not on file  . Food insecurity - worry: Not on file  . Food insecurity - inability: Not on file  . Transportation needs - medical: Not on file  . Transportation needs - non-medical: Not on file  Occupational  History  . Not on file  Tobacco Use  . Smoking status: Former Smoker    Packs/day: 1.50    Years: 8.00    Pack years: 12.00    Types: Cigarettes    Last attempt to quit: 03/05/1958    Years since quitting: 58.9  . Smokeless tobacco: Never Used  Substance and Sexual Activity  . Alcohol use: No    Alcohol/week: 0.0 oz  . Drug use: No  . Sexual activity: Not Currently  Other Topics Concern  . Not on file  Social History Narrative   Married (wife patient of Dr. Yong Channel). 3 children. 4 grandchildren. 3 greatgrandchildren.       Retired from [employer]: rabbit hunting, time with dogs    Outpatient Encounter Medications as of 02/07/2017  Medication Sig  . acetaminophen (TYLENOL) 325 MG tablet Take 650 mg by mouth every evening. Reported on 06/28/2015  . albuterol (PROVENTIL HFA;VENTOLIN HFA) 108 (90 Base) MCG/ACT inhaler Inhale 2 puffs into the lungs every 4 (four) hours as needed for wheezing or shortness of breath.  [name] aspirin EC 81 MG tablet Take 1 tablet (81 mg total) by mouth daily.  . ASSURE COMFORT LANCETS 30G MISC 1 each by Other route daily.  [name] atorvastatin (LIPITOR) 10 MG tablet TAKE ONE TABLET BY MOUTH ONCE DAILY  . betamethasone dipropionate (DIPROLENE) 0.05 % ointment APPLY TO THE OUTSIDE OF EAR DAILY FOR ONE WEEK MAXIMUM  . budesonide-formoterol (SYMBICORT) 160-4.5 MCG/ACT inhaler Inhale 2 puffs into the lungs 2 (two) times daily.  . calcium-vitamin D (OSCAL WITH D) 500-200 MG-UNIT per tablet Take 1 tablet by mouth daily.  [name] glimepiride (AMARYL) 4 MG tablet TAKE ONE & ONE-HALF TABLETS BY MOUTH ONCE DAILY  . glucose blood (ACCU-CHEK AVIVA PLUS) test strip Test blood glucose daily.  [name] glucose blood test strip One Touch Ultra Test Strips  . isosorbide mononitrate (IMDUR) 30 MG 24 hr tablet TAKE THREE TABLETS BY MOUTH ONCE DAILY  . lisinopril (PRINIVIL,ZESTRIL) 5 MG tablet TAKE ONE-HALF TABLET BY MOUTH ONCE DAILY  . metFORMIN (GLUCOPHAGE) 500 MG  tablet Take 0.5 tablets (250 mg total) by mouth daily with breakfast.  . metoprolol succinate (TOPROL-XL) 50 MG 24 hr tablet TAKE 1 TABLET BY MOUTH ONCE DAILY *TAKE  WITH  OR  IMMEDIATELY  FOLLOWING  A  MEAL*  . nitroGLYCERIN (NITROSTAT) 0.4 MG SL tablet Place 1 tablet (0.4 mg total) under the tongue every 5 (five) minutes as needed for chest pain.  . Omega-3 Fatty Acids (FISH OIL PO) Take 2 capsules by mouth 2 (two) times daily.  [name]  Polyethyl Glycol-Propyl Glycol (SYSTANE OP) Place 1 drop into both eyes 4 (four) times daily. Itchy/dry eye  . potassium citrate (UROCIT-K) 10 MEQ (1080 MG) SR tablet Take 10 mEq by mouth Twice daily.  . ranitidine (ZANTAC) 300 MG tablet TAKE 1 TABLET BY MOUTH TWICE DAILY  . ranolazine (RANEXA) 500 MG 12 hr tablet Take 1 tablet (500 mg total) by mouth 2 (two) times daily.  . SYMBICORT 160-4.5 MCG/ACT inhaler INHALE TWO PUFFS BY MOUTH TWICE DAILY  . traZODone (DESYREL) 150 MG tablet TAKE ONE TABLET BY MOUTH ONCE DAILY  . zinc sulfate 220 MG capsule Take 220 mg by mouth daily.  . [DISCONTINUED] traMADol (ULTRAM) 50 MG tablet TAKE 1 TABLET BY MOUTH EVERY 6 HOURS AS NEEDED   No facility-administered encounter medications on file as of 02/07/2017.     Activities of Daily Living In your present state of health, do you have any difficulty performing the following activities: 02/07/2017  Hearing? N  Vision? N  Difficulty concentrating or making decisions? N  Walking or climbing stairs? N  Dressing or bathing? N  Doing errands, shopping? N  Preparing Food and eating ? N  Using the Toilet? N  In the past six months, have you accidently leaked urine? N  Do you have problems with loss of bowel control? N  Managing your Medications? N  Managing your Finances? N  Housekeeping or managing your Housekeeping? N  Some recent data might be hidden     Patient Care Team: Marin Olp, MD as PCP - General (Family Medicine)   Assessment:    Physical assessment deferred  to PCP. There are no preventive care reminders to display for this patient.  Tdap due (last TD was 06/27/2005 Zoster 10/2015  Diet:  A1c 6.9 12/2016  Exercise Activities and Dietary recommendations HDL 32  Current Exercise Habits: Home exercise routine, Type of exercise: walking, Time (Minutes): 60, Frequency (Times/Week): 4, Weekly Exercise (Minutes/Week): 240, Intensity: Mild(mild to moderate; walks hills)   Eye exam 01/2017  Goals    . patient     Continue to stay healthy; Keep walking 3 to 4 times a week       Fall Risk Fall Risk  02/07/2017 02/03/2016 06/28/2015 04/21/2014 01/02/2013  Falls in the past year? No Yes No No No  Number falls in past yr: - 1 - - -  Follow up - Education provided - - -    Depression Screen PHQ 2/9 Scores 02/07/2017 02/03/2016 02/03/2016 06/28/2015  PHQ - 2 Score 0 0 0 0    Cognitive Function MMSE - Mini Mental State Exam 02/07/2017 02/03/2016  Not completed: (No Data) (No Data)  Ad8 score reviewed for issues:  Issues making decisions:  Less interest in hobbies / activities:  Repeats questions, stories (family complaining):  Trouble using ordinary gadgets (microwave, computer, phone):  Forgets the month or year:   Mismanaging finances:   Remembering appts:  Daily problems with thinking and/or memory: Ad8 score is=0      Immunization History  Administered Date(s) Administered  . Influenza Split 12/04/2010, 01/23/2012  . Influenza Whole 12/26/2006, 11/28/2007, 11/24/2008, 12/30/2009  . Influenza, High Dose Seasonal PF 01/13/2013, 12/10/2013  . Influenza-Unspecified 10/31/2015, 11/21/2016  . Pneumococcal Conjugate-13 07/27/2014  . Pneumococcal Polysaccharide-23 06/27/2005  . Td 06/27/2005  . Zoster 10/31/2015   Screening Tests Health Maintenance  Topic Date Due  . TETANUS/TDAP  03/04/2017 (Originally 06/28/2015)  . FOOT EXAM  04/17/2017  . HEMOGLOBIN A1C  06/07/2017  .  OPHTHALMOLOGY EXAM  01/03/2018  . INFLUENZA VACCINE   Completed  . PNA vac Low Risk Adult  Completed       Plan:   Follow up with PCP as directed.  I have personally reviewed and noted the following in the patient's chart:   . Medical and social history . Use of alcohol, tobacco or illicit drugs  . Current medications and supplements . Functional ability and status . Nutritional status . Physical activity . Advanced directives . List of other physicians . Vitals . Screenings to include cognitive, depression, and falls . Referrals and appointments  In addition, I have reviewed and discussed with patient certain preventive protocols, quality metrics, and best practice recommendations. A written personalized care plan for preventive services as well as general preventive health recommendations were provided to patient.     Wynetta Fines, RN  02/07/2017

## 2017-02-06 NOTE — Progress Notes (Signed)
Pre visit review using our clinic review tool, if applicable. No additional management support is needed unless otherwise documented below in the visit note. 

## 2017-02-07 ENCOUNTER — Ambulatory Visit (INDEPENDENT_AMBULATORY_CARE_PROVIDER_SITE_OTHER): Payer: PPO | Admitting: *Deleted

## 2017-02-07 ENCOUNTER — Encounter: Payer: Self-pay | Admitting: *Deleted

## 2017-02-07 ENCOUNTER — Other Ambulatory Visit: Payer: Self-pay

## 2017-02-07 VITALS — BP 160/80 | HR 79 | Ht 69.0 in | Wt 194.0 lb

## 2017-02-07 DIAGNOSIS — Z0000 Encounter for general adult medical examination without abnormal findings: Secondary | ICD-10-CM | POA: Diagnosis not present

## 2017-02-07 MED ORDER — TRAMADOL HCL 50 MG PO TABS: 50.0000 mg | ORAL_TABLET | Freq: Four times a day (QID) | ORAL | 0 refills | Status: DC | PRN

## 2017-02-07 NOTE — Patient Instructions (Addendum)
Roy [name] , Thank you for taking time to come for your Medicare Wellness Visit. I appreciate your ongoing commitment to your health goals. Please review the following plan we discussed and let me know if I can assist you in the future.   Please check your BP at home and if it remains over 140/90, then you will need to schedule apt with Dr. Yong Channel Check it in the am; check it in the afternoon at least 4 to 5 times a week x 2 weeks;    Shingrix is a vaccine for the prevention of Shingles in Adults 50 and older.  If you are on Medicare, you can request a prescription from your doctor to be filled at a pharmacy.  Please check with your benefits regarding applicable copays or out of pocket expenses.  The Shingrix is given in 2 vaccines approx 8 weeks apart. You must receive the 2nd dose prior to 6 months from receipt of the first.   Will review advanced directive for updates    These are the goals we discussed: Goals    . patient     Continue to stay healthy; Keep walking 3 to 4 times a week        This is a list of the screening recommended for you and due dates:  Health Maintenance  Topic Date Due  . Tetanus Vaccine  03/04/2017*  . Complete foot exam   04/17/2017  . Hemoglobin A1C  06/07/2017  . Eye exam for diabetics  01/03/2018  . Flu Shot  Completed  . Pneumonia vaccines  Completed  *Topic was postponed. The date shown is not the original due date.    DASH Eating Plan DASH stands for "Dietary Approaches to Stop Hypertension." The DASH eating plan is a healthy eating plan that has been shown to reduce high blood pressure (hypertension). It may also reduce your risk for type 2 diabetes, heart disease, and stroke. The DASH eating plan may also help with weight loss. What are tips for following this plan? General guidelines  Avoid eating more than 2,300 mg (milligrams) of salt (sodium) a day. If you have hypertension, you may need to reduce your sodium intake to 1,500 mg a  day.  Limit alcohol intake to no more than 1 drink a day for nonpregnant women and 2 drinks a day for men. One drink equals 12 oz of beer, 5 oz of wine, or 1 oz of hard liquor.  Work with your health care provider to maintain a healthy body weight or to lose weight. Ask what an ideal weight is for you.  Get at least 30 minutes of exercise that causes your heart to beat faster (aerobic exercise) most days of the week. Activities may include walking, swimming, or biking.  Work with your health care provider or diet and nutrition specialist (dietitian) to adjust your eating plan to your individual calorie needs. Reading food labels  Check food labels for the amount of sodium per serving. Choose foods with less than 5 percent of the Daily Value of sodium. Generally, foods with less than 300 mg of sodium per serving fit into this eating plan.  To find whole grains, look for the word "whole" as the first word in the ingredient list. Shopping  Buy products labeled as "low-sodium" or "no salt added."  Buy fresh foods. Avoid canned foods and premade or frozen meals. Cooking  Avoid adding salt when cooking. Use salt-free seasonings or herbs instead of table salt or sea  salt. Check with your health care provider or pharmacist before using salt substitutes.  Do not fry foods. Cook foods using healthy methods such as baking, boiling, grilling, and broiling instead.  Cook with heart-healthy oils, such as olive, canola, soybean, or sunflower oil. Meal planning   Eat a balanced diet that includes: ? 5 or more servings of fruits and vegetables each day. At each meal, try to fill half of your plate with fruits and vegetables. ? Up to 6-8 servings of whole grains each day. ? Less than 6 oz of lean meat, poultry, or fish each day. A 3-oz serving of meat is about the same size as a deck of cards. One egg equals 1 oz. ? 2 servings of low-fat dairy each day. ? A serving of nuts, seeds, or beans 5 times  each week. ? Heart-healthy fats. Healthy fats called Omega-3 fatty acids are found in foods such as flaxseeds and coldwater fish, like sardines, salmon, and mackerel.  Limit how much you eat of the following: ? Canned or prepackaged foods. ? Food that is high in trans fat, such as fried foods. ? Food that is high in saturated fat, such as fatty meat. ? Sweets, desserts, sugary drinks, and other foods with added sugar. ? Full-fat dairy products.  Do not salt foods before eating.  Try to eat at least 2 vegetarian meals each week.  Eat more home-cooked food and less restaurant, buffet, and fast food.  When eating at a restaurant, ask that your food be prepared with less salt or no salt, if possible. What foods are recommended? The items listed may not be a complete list. Talk with your dietitian about what dietary choices are best for you. Grains Whole-grain or whole-wheat bread. Whole-grain or whole-wheat pasta. Brown rice. [place]. Bulgur. Whole-grain and low-sodium cereals. Pita bread. Low-fat, low-sodium crackers. Whole-wheat flour tortillas. Vegetables Fresh or frozen vegetables (raw, steamed, roasted, or grilled). Low-sodium or reduced-sodium tomato and vegetable juice. Low-sodium or reduced-sodium tomato sauce and tomato paste. Low-sodium or reduced-sodium canned vegetables. Fruits All fresh, dried, or frozen fruit. Canned fruit in natural juice (without added sugar). Meat and other protein foods Skinless chicken or [place]. Ground chicken or [place]. Pork with fat trimmed off. Fish and seafood. Egg whites. Dried beans, peas, or lentils. Unsalted nuts, nut butters, and seeds. Unsalted canned beans. Lean cuts of beef with fat trimmed off. Low-sodium, lean deli meat. Dairy Low-fat (1%) or fat-free (skim) milk. Fat-free, low-fat, or reduced-fat cheeses. Nonfat, low-sodium ricotta or cottage cheese. Low-fat or nonfat yogurt. Low-fat, low-sodium cheese. Fats and oils Soft margarine  without trans fats. Vegetable oil. Low-fat, reduced-fat, or light mayonnaise and salad dressings (reduced-sodium). Canola, safflower, olive, soybean, and sunflower oils. Avocado. Seasoning and other foods Herbs. Spices. Seasoning mixes without salt. Unsalted popcorn and pretzels. Fat-free sweets. What foods are not recommended? The items listed may not be a complete list. Talk with your dietitian about what dietary choices are best for you. Grains Baked goods made with fat, such as croissants, muffins, or some breads. Dry pasta or rice meal packs. Vegetables Creamed or fried vegetables. Vegetables in a cheese sauce. Regular canned vegetables (not low-sodium or reduced-sodium). Regular canned tomato sauce and paste (not low-sodium or reduced-sodium). Regular tomato and vegetable juice (not low-sodium or reduced-sodium). Angie Fava. Olives. Fruits Canned fruit in a light or heavy syrup. Fried fruit. Fruit in cream or butter sauce. Meat and other protein foods Fatty cuts of meat. Ribs. Fried meat. Berniece Salines. Sausage. Bologna and  other processed lunch meats. Salami. Fatback. Hotdogs. Bratwurst. Salted nuts and seeds. Canned beans with added salt. Canned or smoked fish. Whole eggs or egg yolks. Chicken or [place] with skin. Dairy Whole or 2% milk, cream, and half-and-half. Whole or full-fat cream cheese. Whole-fat or sweetened yogurt. Full-fat cheese. Nondairy creamers. Whipped toppings. Processed cheese and cheese spreads. Fats and oils Butter. Stick margarine. Lard. Shortening. Ghee. Bacon fat. Tropical oils, such as coconut, palm kernel, or palm oil. Seasoning and other foods Salted popcorn and pretzels. Onion salt, garlic salt, seasoned salt, table salt, and sea salt. Worcestershire sauce. Tartar sauce. Barbecue sauce. Teriyaki sauce. Soy sauce, including reduced-sodium. Steak sauce. Canned and packaged gravies. Fish sauce. Oyster sauce. Cocktail sauce. Horseradish that you find on the shelf. Ketchup.  Mustard. Meat flavorings and tenderizers. Bouillon cubes. Hot sauce and Tabasco sauce. Premade or packaged marinades. Premade or packaged taco seasonings. Relishes. Regular salad dressings. Where to find more information:  National Heart, Lung, and [place]: https://wilson-eaton.com/  American Heart Association: www.heart.org Summary  The DASH eating plan is a healthy eating plan that has been shown to reduce high blood pressure (hypertension). It may also reduce your risk for type 2 diabetes, heart disease, and stroke.  With the DASH eating plan, you should limit salt (sodium) intake to 2,300 mg a day. If you have hypertension, you may need to reduce your sodium intake to 1,500 mg a day.  When on the DASH eating plan, aim to eat more fresh fruits and vegetables, whole grains, lean proteins, low-fat dairy, and heart-healthy fats.  Work with your health care provider or diet and nutrition specialist (dietitian) to adjust your eating plan to your individual calorie needs. This information is not intended to replace advice given to you by your health care provider. Make sure you discuss any questions you have with your health care provider. Document Released: 02/08/2011 Document Revised: 02/13/2016 Document Reviewed: 02/13/2016 Elsevier Interactive Patient Education  2017 [place] Prevention in the Home Falls can cause injuries. They can happen to people of all ages. There are many things you can do to make your home safe and to help prevent falls. What can I do on the outside of my home?  Regularly fix the edges of walkways and driveways and fix any cracks.  Remove anything that might make you trip as you walk through a door, such as a raised step or threshold.  Trim any bushes or trees on the path to your home.  Use bright outdoor lighting.  Clear any walking paths of anything that might make someone trip, such as rocks or tools.  Regularly check to see if handrails are  loose or broken. Make sure that both sides of any steps have handrails.  Any raised decks and porches should have guardrails on the edges.  Have any leaves, snow, or ice cleared regularly.  Use sand or salt on walking paths during winter.  Clean up any spills in your garage right away. This includes oil or grease spills. What can I do in the bathroom?  Use night lights.  Install grab bars by the toilet and in the tub and shower. Do not use towel bars as grab bars.  Use non-skid mats or decals in the tub or shower.  If you need to sit down in the shower, use a plastic, non-slip stool.  Keep the floor dry. Clean up any water that spills on the floor as soon as it happens.  Remove  soap buildup in the tub or shower regularly.  Attach bath mats securely with double-sided non-slip rug tape.  Do not have throw rugs and other things on the floor that can make you trip. What can I do in the bedroom?  Use night lights.  Make sure that you have a light by your bed that is easy to reach.  Do not use any sheets or blankets that are too big for your bed. They should not hang down onto the floor.  Have a firm chair that has side arms. You can use this for support while you get dressed.  Do not have throw rugs and other things on the floor that can make you trip. What can I do in the kitchen?  Clean up any spills right away.  Avoid walking on wet floors.  Keep items that you use a lot in easy-to-reach places.  If you need to reach something above you, use a strong step stool that has a grab bar.  Keep electrical cords out of the way.  Do not use floor polish or wax that makes floors slippery. If you must use wax, use non-skid floor wax.  Do not have throw rugs and other things on the floor that can make you trip. What can I do with my stairs?  Do not leave any items on the stairs.  Make sure that there are handrails on both sides of the stairs and use them. Fix handrails that  are broken or loose. Make sure that handrails are as long as the stairways.  Check any carpeting to make sure that it is firmly attached to the stairs. Fix any carpet that is loose or worn.  Avoid having throw rugs at the top or bottom of the stairs. If you do have throw rugs, attach them to the floor with carpet tape.  Make sure that you have a light switch at the top of the stairs and the bottom of the stairs. If you do not have them, ask someone to add them for you. What else can I do to help prevent falls?  Wear shoes that: ? Do not have high heels. ? Have rubber bottoms. ? Are comfortable and fit you well. ? Are closed at the toe. Do not wear sandals.  If you use a stepladder: ? Make sure that it is fully opened. Do not climb a closed stepladder. ? Make sure that both sides of the stepladder are locked into place. ? Ask someone to hold it for you, if possible.  Clearly mark and make sure that you can see: ? Any grab bars or handrails. ? First and last steps. ? Where the edge of each step is.  Use tools that help you move around (mobility aids) if they are needed. These include: ? Canes. ? Walkers. ? Scooters. ? Crutches.  Turn on the lights when you go into a dark area. Replace any light bulbs as soon as they burn out.  Set up your furniture so you have a clear path. Avoid moving your furniture around.  If any of your floors are uneven, fix them.  If there are any pets around you, be aware of where they are.  Review your medicines with your doctor. Some medicines can make you feel dizzy. This can increase your chance of falling. Ask your doctor what other things that you can do to help prevent falls. This information is not intended to replace advice given to you by your health care provider.  Make sure you discuss any questions you have with your health care provider. Document Released: 12/16/2008 Document Revised: 07/28/2015 Document Reviewed: 03/26/2014 Elsevier  Interactive Patient Education  2018 [place] Maintenance, Male A healthy lifestyle and preventive care is important for your health and wellness. Ask your health care provider about what schedule of regular examinations is right for you. What should I know about weight and diet? Eat a Healthy Diet  Eat plenty of vegetables, fruits, whole grains, low-fat dairy products, and lean protein.  Do not eat a lot of foods high in solid fats, added sugars, or salt.  Maintain a Healthy Weight Regular exercise can help you achieve or maintain a healthy weight. You should:  Do at least 150 minutes of exercise each week. The exercise should increase your heart rate and make you sweat (moderate-intensity exercise).  Do strength-training exercises at least twice a week.  Watch Your Levels of Cholesterol and Blood Lipids  Have your blood tested for lipids and cholesterol every 5 years starting at 82 years of age. If you are at high risk for heart disease, you should start having your blood tested when you are 81 years old. You may need to have your cholesterol levels checked more often if: ? Your lipid or cholesterol levels are high. ? You are older than 81 years of age. ? You are at high risk for heart disease.  What should I know about cancer screening? Many types of cancers can be detected early and may often be prevented. Lung Cancer  You should be screened every year for lung cancer if: ? You are a current smoker who has smoked for at least 30 years. ? You are a former smoker who has quit within the past 15 years.  Talk to your health care provider about your screening options, when you should start screening, and how often you should be screened.  Colorectal Cancer  Routine colorectal cancer screening usually begins at 81 years of age and should be repeated every 5-10 years until you are 81 years old. You may need to be screened more often if early forms of precancerous  polyps or small growths are found. Your health care provider may recommend screening at an earlier age if you have risk factors for colon cancer.  Your health care provider may recommend using home test kits to check for hidden blood in the stool.  A small camera at the end of a tube can be used to examine your colon (sigmoidoscopy or colonoscopy). This checks for the earliest forms of colorectal cancer.  Prostate and Testicular Cancer  Depending on your age and overall health, your health care provider may do certain tests to screen for prostate and testicular cancer.  Talk to your health care provider about any symptoms or concerns you have about testicular or prostate cancer.  Skin Cancer  Check your skin from head to toe regularly.  Tell your health care provider about any new moles or changes in moles, especially if: ? There is a change in a mole's size, shape, or color. ? You have a mole that is larger than a pencil eraser.  Always use sunscreen. Apply sunscreen liberally and repeat throughout the day.  Protect yourself by wearing long sleeves, pants, a wide-brimmed hat, and sunglasses when outside.  What should I know about heart disease, diabetes, and high blood pressure?  If you are 34-29 years of age, have your blood pressure checked every 3-5 years. If you  are 28 years of age or older, have your blood pressure checked every year. You should have your blood pressure measured twice-once when you are at a hospital or clinic, and once when you are not at a hospital or clinic. Record the average of the two measurements. To check your blood pressure when you are not at a hospital or clinic, you can use: ? An automated blood pressure machine at a pharmacy. ? A home blood pressure monitor.  Talk to your health care provider about your target blood pressure.  If you are between 61-72 years old, ask your health care provider if you should take aspirin to prevent heart  disease.  Have regular diabetes screenings by checking your fasting blood sugar level. ? If you are at a normal weight and have a low risk for diabetes, have this test once every three years after the age of 38. ? If you are overweight and have a high risk for diabetes, consider being tested at a younger age or more often.  A one-time screening for abdominal aortic aneurysm (AAA) by ultrasound is recommended for men aged 33-75 years who are current or former smokers. What should I know about preventing infection? Hepatitis B If you have a higher risk for hepatitis B, you should be screened for this virus. Talk with your health care provider to find out if you are at risk for hepatitis B infection. Hepatitis C Blood testing is recommended for:  Everyone born from [birth year] through 1963.  Anyone with known risk factors for hepatitis C.  Sexually Transmitted Diseases (STDs)  You should be screened each year for STDs including gonorrhea and chlamydia if: ? You are sexually active and are younger than 81 years of age. ? You are older than 81 years of age and your health care provider tells you that you are at risk for this type of infection. ? Your sexual activity has changed since you were last screened and you are at an increased risk for chlamydia or gonorrhea. Ask your health care provider if you are at risk.  Talk with your health care provider about whether you are at high risk of being infected with HIV. Your health care provider may recommend a prescription medicine to help prevent HIV infection.  What else can I do?  Schedule regular health, dental, and eye exams.  Stay current with your vaccines (immunizations).  Do not use any tobacco products, such as cigarettes, chewing tobacco, and e-cigarettes. If you need help quitting, ask your health care provider.  Limit alcohol intake to no more than 2 drinks per day. One drink equals 12 ounces of beer, 5 ounces of wine, or 1 ounces of  hard liquor.  Do not use street drugs.  Do not share needles.   Ask your health care provider for help if you need support or information about quitting drugs.  Tell your health care provider if you often feel depressed.  Tell your health care provider if you have ever been abused or do not feel safe at home. This information is not intended to replace advice given to you by your health care provider. Make sure you discuss any questions you have with your health care provider. Document Released: 08/18/2007 Document Revised: 10/19/2015 Document Reviewed: 11/23/2014 Elsevier Interactive Patient Education  [employer].

## 2017-02-07 NOTE — Progress Notes (Signed)
I have reviewed and agree with note, evaluation, plan.   Agree needs to follow up if BP does not go down at home BP Readings from Last 3 Encounters:  02/07/17 (!) 160/80  12/07/16 (!) 146/80  10/30/16 134/66  had mild elevation last visit and appears to be trending up  [name], MD

## 2017-02-08 ENCOUNTER — Telehealth: Payer: Self-pay

## 2017-02-08 NOTE — Telephone Encounter (Signed)
Roy [name] in for AWV 12/06 BP found to elevated 160/80. Eating a lot of sodium. Will fup in 2 weeks (around 12/21) to note his home BP reading. [place] RN

## 2017-02-13 ENCOUNTER — Telehealth: Payer: Self-pay | Admitting: Pulmonary Disease

## 2017-02-13 NOTE — Telephone Encounter (Signed)
Called pt and advised him that we did not have any Symbicort samples and advised him to call back on Friday. Pt agreed and nothing further is needed.

## 2017-02-15 ENCOUNTER — Telehealth: Payer: Self-pay | Admitting: Pulmonary Disease

## 2017-02-15 MED ORDER — BUDESONIDE-FORMOTEROL FUMARATE 160-4.5 MCG/ACT IN AERO: 2.0000 | INHALATION_SPRAY | Freq: Two times a day (BID) | RESPIRATORY_TRACT | 0 refills | Status: DC

## 2017-02-15 NOTE — Telephone Encounter (Signed)
Spoke with the pt's spouse  1 sample of Symbicort 160 up front for pick up  Nothing further needed

## 2017-03-01 NOTE — Telephone Encounter (Signed)
Call to Mr. Nikkel and states his bp is running very good. Last few checks have been 120 to 130/ 70's.  Will continue to check  [facility] RN

## 2017-03-08 ENCOUNTER — Other Ambulatory Visit: Payer: Self-pay | Admitting: Family Medicine

## 2017-03-08 DIAGNOSIS — E119 Type 2 diabetes mellitus without complications: Secondary | ICD-10-CM

## 2017-04-09 ENCOUNTER — Ambulatory Visit: Payer: PPO | Admitting: Family Medicine

## 2017-04-09 ENCOUNTER — Encounter: Payer: Self-pay | Admitting: Family Medicine

## 2017-04-09 VITALS — BP 120/66 | HR 56 | Temp 97.4°F | Ht 69.0 in | Wt 194.0 lb

## 2017-04-09 DIAGNOSIS — N183 Chronic kidney disease, stage 3 unspecified: Secondary | ICD-10-CM

## 2017-04-09 DIAGNOSIS — D696 Thrombocytopenia, unspecified: Secondary | ICD-10-CM | POA: Diagnosis not present

## 2017-04-09 DIAGNOSIS — I25119 Atherosclerotic heart disease of native coronary artery with unspecified angina pectoris: Secondary | ICD-10-CM

## 2017-04-09 DIAGNOSIS — S61411A Laceration without foreign body of right hand, initial encounter: Secondary | ICD-10-CM

## 2017-04-09 DIAGNOSIS — R0602 Shortness of breath: Secondary | ICD-10-CM

## 2017-04-09 DIAGNOSIS — I152 Hypertension secondary to endocrine disorders: Secondary | ICD-10-CM

## 2017-04-09 DIAGNOSIS — I2510 Atherosclerotic heart disease of native coronary artery without angina pectoris: Secondary | ICD-10-CM

## 2017-04-09 DIAGNOSIS — E1159 Type 2 diabetes mellitus with other circulatory complications: Secondary | ICD-10-CM

## 2017-04-09 DIAGNOSIS — E1122 Type 2 diabetes mellitus with diabetic chronic kidney disease: Secondary | ICD-10-CM | POA: Diagnosis not present

## 2017-04-09 DIAGNOSIS — I10 Essential (primary) hypertension: Secondary | ICD-10-CM

## 2017-04-09 DIAGNOSIS — Z23 Encounter for immunization: Secondary | ICD-10-CM

## 2017-04-09 LAB — CBC
HEMATOCRIT: 36.1 % — AB (ref 39.0–52.0)
Hemoglobin: 12.2 g/dL — ABNORMAL LOW (ref 13.0–17.0)
MCHC: 33.8 g/dL (ref 30.0–36.0)
MCV: 87.1 fl (ref 78.0–100.0)
Platelets: 126 10*3/uL — ABNORMAL LOW (ref 150.0–400.0)
RBC: 4.14 Mil/uL — AB (ref 4.22–5.81)
RDW: 15.2 % (ref 11.5–15.5)
WBC: 7.6 10*3/uL (ref 4.0–10.5)

## 2017-04-09 LAB — COMPREHENSIVE METABOLIC PANEL
ALBUMIN: 4.2 g/dL (ref 3.5–5.2)
ALK PHOS: 67 U/L (ref 39–117)
ALT: 12 U/L (ref 0–53)
AST: 11 U/L (ref 0–37)
BILIRUBIN TOTAL: 0.8 mg/dL (ref 0.2–1.2)
BUN: 32 mg/dL — AB (ref 6–23)
CO2: 27 mEq/L (ref 19–32)
Calcium: 8.8 mg/dL (ref 8.4–10.5)
Chloride: 103 mEq/L (ref 96–112)
Creatinine, Ser: 2.07 mg/dL — ABNORMAL HIGH (ref 0.40–1.50)
GFR: 32.81 mL/min — AB (ref >60.00)
Glucose, Bld: 216 mg/dL — ABNORMAL HIGH (ref 70–99)
POTASSIUM: 4.4 meq/L (ref 3.5–5.1)
Sodium: 138 mEq/L (ref 135–145)
TOTAL PROTEIN: 6.4 g/dL (ref 6.0–8.3)

## 2017-04-09 LAB — HEMOGLOBIN A1C: HEMOGLOBIN A1C: 6.7 % — AB (ref 4.6–6.5)

## 2017-04-09 MED ORDER — TRAZODONE HCL 150 MG PO TABS: 150.0000 mg | ORAL_TABLET | Freq: Every day | ORAL | 3 refills | Status: DC

## 2017-04-09 MED ORDER — ATORVASTATIN CALCIUM 10 MG PO TABS: 10.0000 mg | ORAL_TABLET | Freq: Every day | ORAL | 3 refills | Status: DC

## 2017-04-09 MED ORDER — NITROGLYCERIN 0.4 MG SL SUBL: 0.4000 mg | SUBLINGUAL_TABLET | SUBLINGUAL | 1 refills | Status: DC | PRN

## 2017-04-09 NOTE — Assessment & Plan Note (Signed)
S: GFR in 30-40 still. He is on low dose ace I in case proteinuric though would consider stopping if worsens.  A/P: update GFR> If drops below 30 - would refer to renal.

## 2017-04-09 NOTE — Assessment & Plan Note (Signed)
Chest pain free as long as taking ranexa and imdur. Not having to use nitroglycerin- will refill this. Continue follow up with Dr. Radford Pax
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
## 2017-04-09 NOTE — Addendum Note (Signed)
Addended by: Mariam Dollar, Roselyn Reef M on: 04/09/2017 11:08 AM   Modules accepted: Orders

## 2017-04-09 NOTE — Assessment & Plan Note (Signed)
S: controlled on imdur 90mg , lisinopril 5mg , metoprolol 50mg  XL. Visit in December was with AWV- Bp was monitored at home and improved BP Readings from Last 3 Encounters:  04/09/17 120/66  02/07/17 (!) 160/80  12/07/16 (!) 146/80  A/P: We discussed blood pressure goal of <140/90. Continue current meds:  As he is at goal today.

## 2017-04-09 NOTE — Assessment & Plan Note (Signed)
S: well controlled. On metformin 250mg , amaryl 6mg - we are monitoring his GFR Lab Results  Component Value Date   HGBA1C 6.9 (H) 12/07/2016   HGBA1C 7.1 (H) 08/07/2016   HGBA1C 6.7 (H) 04/17/2016   A/P: update a1c today- has been doing well.

## 2017-04-09 NOTE — Assessment & Plan Note (Signed)
S: continued mild thrombocytopenia but over 100. mild anemia in past- last CBC normal. Ferritin has been normal A/P: we will continue to monitor CBC at least every 8 months- given drawing bloodwork today anyway will check today.

## 2017-04-09 NOTE — Patient Instructions (Addendum)
Please stop by lab before you go  Tetanus shot today  No changes in medication today but did refill medications as requested

## 2017-04-27 ENCOUNTER — Other Ambulatory Visit: Payer: Self-pay | Admitting: Cardiology

## 2017-04-27 DIAGNOSIS — I10 Essential (primary) hypertension: Secondary | ICD-10-CM

## 2017-05-02 ENCOUNTER — Telehealth: Payer: Self-pay | Admitting: *Deleted

## 2017-05-02 NOTE — Telephone Encounter (Signed)
Patient is not certain who his DME is but he would like to have [facility] in [place].  Reached out to [place] spoke to [place] who states the patient has only gotten supplies from them and he does not have a CPAP on file with them. Patient has an appointment with Dr Radford Pax on Monday March 4 and will be established with [facility] at that time.

## 2017-05-05 NOTE — Progress Notes (Signed)
Cardiology Office Note:    Date:  05/07/2017   ID:  Roy [name], DOB December 19, 1932, MRN [id number]  PCP:  Marin Olp, MD  Cardiologist:  No primary care provider on file.    Referring MD: Marin Olp, MD   Chief Complaint  Patient presents with  . Coronary Artery Disease  . Hypertension  . Sleep Apnea    History of Present Illness:    Roy [name] is a 84 y.o. male with a hx of CAD s/p PTCA D1 in 1999 with repeat cath 12/2011 with nonobstructive dz (CP felt secondary to small vessel dz), CKD, diabetes, HTN, OSA on CPAP and hyperlipidemia.  He is here today for followup and is doing well.  He denies any chest pain or pressure, SOB, DOE, PND, orthopnea, LE edema, dizziness, palpitations or syncope. He is compliant with his meds and is tolerating meds with no SE.  He has not been using his CPAP device because he has been having a cough.  His device is old and wants a new one.  He tolerates the full face mask and feels the pressure is adequate.      Past Medical History:  Diagnosis Date  . Anginal pain ([place])   . Arthritis    spine  . Asthma   . CAD (coronary artery disease)    s/p PCI of the D1 with cath in 2013 showing nonobstructive disease.  CP presumed due to small vessel disease.  [name] CAP (community acquired pneumonia) 12/26/2011  . Chronic kidney disease    renal insufficiency  . Diabetes mellitus, type 2 ([place])   . Fever 12/26/2011  . GERD (gastroesophageal reflux disease)   . Heart murmur   . Hyperlipidemia   . Hypertension   . Lyme disease    states had shakes that were thought to be parkinson's related but related to tick bite, states also RMSF  . Myocardial infarction ([place])   . Shortness of breath   . Sleep apnea    uses cpap    Past Surgical History:  Procedure Laterality Date  . APPENDECTOMY    . CERVICAL LAMINECTOMY    . CHOLECYSTECTOMY    . KIDNEY STONE SURGERY    . LEFT HEART CATH N/A 02/15/2012   Procedure: LEFT HEART CATH;  Surgeon: Larey Dresser, MD;  Location: [facility] CATH LAB;  Service: Cardiovascular;  Laterality: N/A;    Current Medications: Current Meds  Medication Sig  . acetaminophen (TYLENOL) 325 MG tablet Take 650 mg by mouth every evening. Reported on 06/28/2015  . albuterol (PROVENTIL HFA;VENTOLIN HFA) 108 (90 Base) MCG/ACT inhaler Inhale 2 puffs into the lungs every 4 (four) hours as needed for wheezing or shortness of breath.  [name] aspirin EC 81 MG tablet Take 1 tablet (81 mg total) by mouth daily.  . ASSURE COMFORT LANCETS 30G MISC 1 each by Other route daily.  [name] atorvastatin (LIPITOR) 10 MG tablet Take 1 tablet (10 mg total) by mouth daily.  . betamethasone dipropionate (DIPROLENE) 0.05 % ointment APPLY TO THE OUTSIDE OF EAR DAILY FOR ONE WEEK MAXIMUM  . calcium-vitamin D (OSCAL WITH D) 500-200 MG-UNIT per tablet Take 1 tablet by mouth daily.  [name] glimepiride (AMARYL) 4 MG tablet TAKE 1 & 1/2 (ONE & ONE-HALF) TABLETS BY MOUTH ONCE DAILY  . glucose blood (ACCU-CHEK AVIVA PLUS) test strip Test blood glucose daily.  [name] glucose blood test strip One Touch Ultra Test Strips  . isosorbide mononitrate (IMDUR) 30 MG 24  hr tablet TAKE THREE TABLETS BY MOUTH ONCE DAILY  . lisinopril (PRINIVIL,ZESTRIL) 5 MG tablet TAKE ONE-HALF TABLET BY MOUTH ONCE DAILY  . metFORMIN (GLUCOPHAGE) 500 MG tablet Take 0.5 tablets (250 mg total) by mouth daily with breakfast.  . metoprolol succinate (TOPROL-XL) 50 MG 24 hr tablet TAKE 1 TABLET BY MOUTH ONCE DAILY *TAKE  WITH  OR  IMMEDIATELY  FOLLOWING  A  MEAL*  . nitroGLYCERIN (NITROSTAT) 0.4 MG SL tablet Place 1 tablet (0.4 mg total) under the tongue every 5 (five) minutes as needed for chest pain.  . Omega-3 Fatty Acids (FISH OIL PO) Take 2 capsules by mouth 2 (two) times daily.  [name] (SYSTANE OP) Place 1 drop into both eyes 4 (four) times daily. Itchy/dry eye  . potassium citrate (UROCIT-K) 10 MEQ (1080 MG) SR tablet Take 10 mEq by mouth Twice daily.  . ranitidine (ZANTAC)  300 MG tablet TAKE 1 TABLET BY MOUTH TWICE DAILY  . ranolazine (RANEXA) 500 MG 12 hr tablet Take 1 tablet (500 mg total) by mouth 2 (two) times daily. Please keep upcoming appt for future refills. Thank you  . SYMBICORT 160-4.5 MCG/ACT inhaler INHALE TWO PUFFS BY MOUTH TWICE DAILY  . traMADol (ULTRAM) 50 MG tablet Take 50 mg by mouth every 6 (six) hours as needed for moderate pain.  . traZODone (DESYREL) 150 MG tablet Take 1 tablet (150 mg total) by mouth daily.  [name] zinc sulfate 220 MG capsule Take 220 mg by mouth daily.     Allergies:   Codeine sulfate and Cephalexin   Social History   Socioeconomic History  . Marital status: Married    Spouse name: None  . Number of children: None  . Years of education: None  . Highest education level: None  Social Needs  . Financial resource strain: None  . Food insecurity - worry: None  . Food insecurity - inability: None  . Transportation needs - medical: None  . Transportation needs - non-medical: None  Occupational History  . None  Tobacco Use  . Smoking status: Former Smoker    Packs/day: 1.50    Years: 8.00    Pack years: 12.00    Types: Cigarettes    Last attempt to quit: 03/05/1958    Years since quitting: 59.2  . Smokeless tobacco: Never Used  Substance and Sexual Activity  . Alcohol use: No    Alcohol/week: 0.0 oz  . Drug use: No  . Sexual activity: Not Currently  Other Topics Concern  . None  Social History Narrative   Married (wife patient of Dr. Yong Channel). 3 children. 4 grandchildren. 3 greatgrandchildren.       Retired from [employer]: rabbit hunting, time with dogs     Family History: The patient's family history includes ALS in his father; Asthma in his daughter; Cancer in his sister.  ROS:   Please see the history of present illness.    ROS  All other systems reviewed and negative.   EKGs/Labs/Other Studies Reviewed:    The following studies were reviewed today: PAP  download  EKG:  EKG is not  ordered today.    Recent Labs: 04/09/2017: ALT 12; BUN 32; Creatinine, Ser 2.07; Hemoglobin 12.2; Platelets 126.0; Potassium 4.4; Sodium 138   Recent Lipid Panel    Component Value Date/Time   CHOL 117 04/17/2016 1002   TRIG 140.0 04/17/2016 1002   TRIG 205 (HH) 12/17/2005 1045  HDL 32.60 (L) 04/17/2016 1002   CHOLHDL 4 04/17/2016 1002   VLDL 28.0 04/17/2016 1002   LDLCALC 57 04/17/2016 1002   LDLDIRECT 45.0 09/27/2015 0951    Physical Exam:    VS:  BP (!) 150/68   Pulse 63   Ht 5\' 9"  (1.753 m)   Wt 195 lb 6.4 oz (88.6 kg)   BMI 28.86 kg/m     Wt Readings from Last 3 Encounters:  05/07/17 195 lb 6.4 oz (88.6 kg)  04/09/17 194 lb (88 kg)  02/07/17 194 lb (88 kg)     GEN:  Well nourished, well developed in no acute distress HEENT: Normal NECK: No JVD; No carotid bruits LYMPHATICS: No lymphadenopathy CARDIAC: RRR, no murmurs, rubs, gallops RESPIRATORY:  Clear to auscultation without rales, wheezing or rhonchi  ABDOMEN: Soft, non-tender, non-distended MUSCULOSKELETAL:  No edema; No deformity  SKIN: Warm and dry NEUROLOGIC:  Alert and oriented x 3 PSYCHIATRIC:  Normal affect   ASSESSMENT:    1. Coronary artery disease involving native coronary artery of native heart with angina pectoris ([place])   2. Hypertension associated with diabetes ([place])   3. Obstructive sleep apnea syndrome   4. Pure hypercholesterolemia    PLAN:    In order of problems listed above:  1.  ASCAD - s/p PTCA D1 in 1999 with repeat cath 12/2011 with nonobstructive dz - CP felt secondary to small vessel dz.  He has not had any further anginal sx.  He will continue on aspirin 81 mg daily, Ranexa 500 mg twice daily, Toprol-XL 50 mg daily and Imdur 30 mg daily.  He will also continue on statin therapy.  2.  HTN -his blood pressure borderline controlled on exam today.  He will continue on lisinopril 5 mg daily andToprol XL 50 mg daily. Creatinine was elevated at 2 on  04/09/2017  3.  OSA - he has not been using his CPAP recently as it is an old machine and he has been having a cough recently with it. I will order him a new device.    4.  Hyperlipidemia with LDL goal < 70.  He will continue on atorvastatin 10mg  daily.  I will get an FLP and ALT.      Medication Adjustments/Labs and Tests Ordered: Current medicines are reviewed at length with the patient today.  Concerns regarding medicines are outlined above.  No orders of the defined types were placed in this encounter.  No orders of the defined types were placed in this encounter.   Signed, Fransico Him, MD  05/07/2017 10:14 AM    [place]

## 2017-05-07 ENCOUNTER — Encounter: Payer: Self-pay | Admitting: Cardiology

## 2017-05-07 ENCOUNTER — Telehealth: Payer: Self-pay

## 2017-05-07 ENCOUNTER — Ambulatory Visit: Payer: PPO | Admitting: Cardiology

## 2017-05-07 VITALS — BP 150/68 | HR 63 | Ht 69.0 in | Wt 195.4 lb

## 2017-05-07 DIAGNOSIS — I10 Essential (primary) hypertension: Secondary | ICD-10-CM | POA: Diagnosis not present

## 2017-05-07 DIAGNOSIS — I25119 Atherosclerotic heart disease of native coronary artery with unspecified angina pectoris: Secondary | ICD-10-CM

## 2017-05-07 DIAGNOSIS — G4733 Obstructive sleep apnea (adult) (pediatric): Secondary | ICD-10-CM

## 2017-05-07 DIAGNOSIS — E7800 Pure hypercholesterolemia, unspecified: Secondary | ICD-10-CM

## 2017-05-07 DIAGNOSIS — E1159 Type 2 diabetes mellitus with other circulatory complications: Secondary | ICD-10-CM | POA: Diagnosis not present

## 2017-05-07 NOTE — Patient Instructions (Addendum)
Medication Instructions:  Your provider recommends that you continue on your current medications as directed. Please refer to the Current Medication list given to you today.    Labwork: None  Testing/Procedures: None  Follow-Up: You have an appointment with Dr. Radford Pax August 05, 2017 at 10:00AM.   Any Other Special Instructions Will Be Listed Below (If Applicable). Dr. Radford Pax is ordering for you a new PAP device.     If you need a refill on your cardiac medications before your next appointment, please call your pharmacy.

## 2017-05-07 NOTE — Telephone Encounter (Signed)
Per Dr. Radford Pax, scheduled patient for FLP and ALT this Thursday. Patient was grateful for call and agrees with treatment plan.

## 2017-05-09 ENCOUNTER — Other Ambulatory Visit: Payer: PPO | Admitting: *Deleted

## 2017-05-09 DIAGNOSIS — E7800 Pure hypercholesterolemia, unspecified: Secondary | ICD-10-CM | POA: Diagnosis not present

## 2017-05-09 LAB — ALT: ALT: 11 IU/L (ref 0–44)

## 2017-05-09 LAB — LIPID PANEL
Chol/HDL Ratio: 3.4 ratio (ref 0.0–5.0)
Cholesterol, Total: 119 mg/dL (ref 100–199)
HDL: 35 mg/dL — ABNORMAL LOW (ref >39)
LDL Calculated: 55 mg/dL (ref 0–99)
Triglycerides: 144 mg/dL (ref 0–149)
VLDL CHOLESTEROL CAL: 29 mg/dL (ref 5–40)

## 2017-05-10 NOTE — Telephone Encounter (Signed)
Reached out to patient today to ask him to take his cpap to [facility] for a download so we can know what his settings are and get him started on his new cpap unit.. Patient agreed to get a download from [facility] in the coming week.

## 2017-05-17 ENCOUNTER — Telehealth: Payer: Self-pay | Admitting: Cardiology

## 2017-05-17 NOTE — Telephone Encounter (Signed)
New Message   Patient states that he was told to call and provide [place] the number to his CPAP machine.  The numbers are [facility].

## 2017-05-17 NOTE — Telephone Encounter (Signed)
Please order a Airsense CPAP at 7cm H2O with heated humidity and supplies

## 2017-05-21 ENCOUNTER — Telehealth: Payer: Self-pay | Admitting: *Deleted

## 2017-05-21 DIAGNOSIS — G4730 Sleep apnea, unspecified: Secondary | ICD-10-CM

## 2017-05-21 NOTE — Telephone Encounter (Signed)
Order  faxed to [facility] 05/21/17.  Fax sheet states pt. Prefers [place] office

## 2017-05-21 NOTE — Telephone Encounter (Signed)
Per Dr Radford Pax. Airsense CPAP at 7cm H2O with heated humidity and supplies. See 05/17/17 phone note.

## 2017-05-27 ENCOUNTER — Other Ambulatory Visit: Payer: Self-pay | Admitting: Cardiology

## 2017-05-27 DIAGNOSIS — I10 Essential (primary) hypertension: Secondary | ICD-10-CM

## 2017-05-31 NOTE — Telephone Encounter (Signed)
    Order  faxed to [facility] 05/21/17.  Fax sheet states pt. Prefers [place] office

## 2017-07-16 ENCOUNTER — Encounter: Payer: Self-pay | Admitting: Cardiology

## 2017-07-24 ENCOUNTER — Other Ambulatory Visit: Payer: Self-pay | Admitting: Family Medicine

## 2017-07-30 ENCOUNTER — Telehealth: Payer: Self-pay | Admitting: Pulmonary Disease

## 2017-07-30 NOTE — Telephone Encounter (Signed)
Spoke with pt, advised him that sample of Symbicort will be placed up front for him to pick up. Pt understood and nothing further is needed.

## 2017-08-05 ENCOUNTER — Encounter: Payer: Self-pay | Admitting: Cardiology

## 2017-08-05 ENCOUNTER — Telehealth: Payer: Self-pay | Admitting: *Deleted

## 2017-08-05 ENCOUNTER — Encounter: Payer: PPO | Admitting: Cardiology

## 2017-08-05 VITALS — BP 158/72 | HR 65 | Ht 69.0 in | Wt 186.0 lb

## 2017-08-05 DIAGNOSIS — G4733 Obstructive sleep apnea (adult) (pediatric): Secondary | ICD-10-CM

## 2017-08-05 DIAGNOSIS — E1159 Type 2 diabetes mellitus with other circulatory complications: Secondary | ICD-10-CM

## 2017-08-05 DIAGNOSIS — I152 Hypertension secondary to endocrine disorders: Secondary | ICD-10-CM

## 2017-08-05 DIAGNOSIS — I10 Essential (primary) hypertension: Secondary | ICD-10-CM

## 2017-08-05 NOTE — Patient Instructions (Signed)
Medication Instructions:  Your physician recommends that you continue on your current medications as directed. Please refer to the Current Medication list given to you today.  If you need a refill on your cardiac medications, please contact your pharmacy first.  Labwork: None ordered   Testing/Procedures: None ordered   Follow-Up: Your physician wants you to follow-up in: 1 year with Dr. Turner. You will receive a reminder letter in the mail two months in advance. If you don't receive a letter, please call our office to schedule the follow-up appointment.  Any Other Special Instructions Will Be Listed Below (If Applicable).   Thank you for choosing [facility]    Rena Tamala Manzer, RN  [phone]  If you need a refill on your cardiac medications before your next appointment, please call your pharmacy.   

## 2017-08-05 NOTE — Telephone Encounter (Signed)
Split night ordered for patient and sent to precert.

## 2017-08-05 NOTE — Progress Notes (Deleted)
Cardiology Office Note:    Date:  08/05/2017   ID:  Roy [name], DOB 10/19/1932, MRN [id number]  PCP:  Marin Olp, MD  Cardiologist:  No primary care provider on file.    Referring MD: Marin Olp, MD   No chief complaint on file.   History of Present Illness:    Roy [name] is a 84 y.o. male with a hx of CAD s/p PTCA D1 in 1999 with repeat cath 12/2011 with nonobstructive dz (CP felt secondary to small vessel dz), CKD, diabetes, HTN,OSA on CPAP and hyperlipidemia.  He is here today for 10-week follow-up after getting a new CPAP device per insurance requirements.He is doing well with his CPAP device and thinks that he has gotten used to it.  He tolerates the mask and feels the pressure is adequate.  Since going on CPAP he feels rested in the am and has no significant daytime sleepiness.  He denies any significant mouth or nasal dryness or nasal congestion.  He does not think that he snores.      Past Medical History:  Diagnosis Date  . Anginal pain ([place])   . Arthritis    spine  . Asthma   . CAD (coronary artery disease)    s/p PCI of the D1 with cath in 2013 showing nonobstructive disease.  CP presumed due to small vessel disease.  [name] CAP (community acquired pneumonia) 12/26/2011  . Chronic kidney disease    renal insufficiency  . Diabetes mellitus, type 2 ([place])   . Fever 12/26/2011  . GERD (gastroesophageal reflux disease)   . Heart murmur   . Hyperlipidemia   . Hypertension   . Lyme disease    states had shakes that were thought to be parkinson's related but related to tick bite, states also RMSF  . Myocardial infarction ([place])   . Shortness of breath   . Sleep apnea    uses cpap    Past Surgical History:  Procedure Laterality Date  . APPENDECTOMY    . CERVICAL LAMINECTOMY    . CHOLECYSTECTOMY    . KIDNEY STONE SURGERY    . LEFT HEART CATH N/A 02/15/2012   Procedure: LEFT HEART CATH;  Surgeon: Larey Dresser, MD;  Location: [facility] CATH LAB;  Service:  Cardiovascular;  Laterality: N/A;    Current Medications: Current Meds  Medication Sig  . acetaminophen (TYLENOL) 325 MG tablet Take 650 mg by mouth every evening. Reported on 06/28/2015  . albuterol (PROVENTIL HFA;VENTOLIN HFA) 108 (90 Base) MCG/ACT inhaler Inhale 2 puffs into the lungs every 4 (four) hours as needed for wheezing or shortness of breath.  [name] aspirin EC 81 MG tablet Take 1 tablet (81 mg total) by mouth daily.  . ASSURE COMFORT LANCETS 30G MISC 1 each by Other route daily.  [name] atorvastatin (LIPITOR) 10 MG tablet Take 1 tablet (10 mg total) by mouth daily.  . betamethasone dipropionate (DIPROLENE) 0.05 % ointment APPLY TO THE OUTSIDE OF EAR DAILY FOR ONE WEEK MAXIMUM  . calcium-vitamin D (OSCAL WITH D) 500-200 MG-UNIT per tablet Take 1 tablet by mouth daily.  [name] glimepiride (AMARYL) 4 MG tablet TAKE 1 & 1/2 (ONE & ONE-HALF) TABLETS BY MOUTH ONCE DAILY  . glucose blood (ACCU-CHEK AVIVA PLUS) test strip Test blood glucose daily.  [name] glucose blood test strip One Touch Ultra Test Strips  . isosorbide mononitrate (IMDUR) 30 MG 24 hr tablet TAKE THREE TABLETS BY MOUTH ONCE DAILY  . lisinopril (PRINIVIL,ZESTRIL) 5 MG  tablet TAKE ONE-HALF TABLET BY MOUTH ONCE DAILY  . metFORMIN (GLUCOPHAGE) 500 MG tablet Take 0.5 tablets (250 mg total) by mouth daily with breakfast.  . metoprolol succinate (TOPROL-XL) 50 MG 24 hr tablet TAKE 1 TABLET BY MOUTH ONCE DAILY *TAKE  WITH  OR  IMMEDIATELY  FOLLOWING  A  MEAL*  . nitroGLYCERIN (NITROSTAT) 0.4 MG SL tablet Place 1 tablet (0.4 mg total) under the tongue every 5 (five) minutes as needed for chest pain.  . Omega-3 Fatty Acids (FISH OIL PO) Take 2 capsules by mouth 2 (two) times daily.  [name] (SYSTANE OP) Place 1 drop into both eyes 4 (four) times daily. Itchy/dry eye  . potassium citrate (UROCIT-K) 10 MEQ (1080 MG) SR tablet Take 10 mEq by mouth Twice daily.  . ranitidine (ZANTAC) 300 MG tablet TAKE 1 TABLET BY MOUTH TWICE  DAILY  . ranolazine (RANEXA) 500 MG 12 hr tablet Take 1 tablet (500 mg total) by mouth 2 (two) times daily.  . SYMBICORT 160-4.5 MCG/ACT inhaler INHALE TWO PUFFS BY MOUTH TWICE DAILY  . traMADol (ULTRAM) 50 MG tablet Take 50 mg by mouth every 6 (six) hours as needed for moderate pain.  [name] zinc sulfate 220 MG capsule Take 220 mg by mouth daily.     Allergies:   Codeine sulfate and Cephalexin   Social History   Socioeconomic History  . Marital status: Married    Spouse name: Not on file  . Number of children: Not on file  . Years of education: Not on file  . Highest education level: Not on file  Occupational History  . Not on file  Social Needs  . Financial resource strain: Not on file  . Food insecurity:    Worry: Not on file    Inability: Not on file  . Transportation needs:    Medical: Not on file    Non-medical: Not on file  Tobacco Use  . Smoking status: Former Smoker    Packs/day: 1.50    Years: 8.00    Pack years: 12.00    Types: Cigarettes    Last attempt to quit: 03/05/1958    Years since quitting: 59.4  . Smokeless tobacco: Never Used  Substance and Sexual Activity  . Alcohol use: No    Alcohol/week: 0.0 oz  . Drug use: No  . Sexual activity: Not Currently  Lifestyle  . Physical activity:    Days per week: Not on file    Minutes per session: Not on file  . Stress: Not on file  Relationships  . Social connections:    Talks on phone: Not on file    Gets together: Not on file    Attends religious service: Not on file    Active member of club or organization: Not on file    Attends meetings of clubs or organizations: Not on file    Relationship status: Not on file  Other Topics Concern  . Not on file  Social History Narrative   Married (wife patient of Dr. Yong Channel). 3 children. 4 grandchildren. 3 greatgrandchildren.       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  ROS:   Please see the history of present illness.    ROS  All other systems reviewed and negative.  EKGs/Labs/Other Studies Reviewed:    The following studies were reviewed today: PAP download  EKG:  EKG is not ordered today.   Recent Labs: 04/09/2017: BUN 32; Creatinine, Ser 2.07; Hemoglobin 12.2; Platelets 126.0; Potassium 4.4; Sodium 138 05/09/2017: ALT 11   Recent Lipid Panel    Component Value Date/Time   CHOL 119 05/09/2017 0826   TRIG 144 05/09/2017 0826   TRIG 205 (HH) 12/17/2005 1045   HDL 35 (L) 05/09/2017 0826   CHOLHDL 3.4 05/09/2017 0826   CHOLHDL 4 04/17/2016 1002   VLDL 28.0 04/17/2016 1002   LDLCALC 55 05/09/2017 0826   LDLDIRECT 45.0 09/27/2015 0951    Physical Exam:    VS:  BP (!) 158/72   Pulse 65   Ht 5\' 9"  (1.753 m)   Wt 186 lb (84.4 kg)   SpO2 95%   BMI 27.47 kg/m     Wt Readings from Last 3 Encounters:  08/05/17 186 lb (84.4 kg)  05/07/17 195 lb 6.4 oz (88.6 kg)  04/09/17 194 lb (88 kg)     GEN:  Well nourished, well developed in no acute distress HEENT: Normal NECK: No JVD; No carotid bruits LYMPHATICS: No lymphadenopathy CARDIAC: RRR, no murmurs, rubs, gallops RESPIRATORY:  Clear to auscultation without rales, wheezing or rhonchi  ABDOMEN: Soft, non-tender, non-distended MUSCULOSKELETAL:  No edema; No deformity  SKIN: Warm and dry NEUROLOGIC:  Alert and oriented x 3 PSYCHIATRIC:  Normal affect   ASSESSMENT:    1. Obstructive sleep apnea syndrome   2. Hypertension associated with diabetes ([place])    PLAN:    In order of problems listed above:  1.  OSA - the patient is tolerating PAP therapy well without any problems. The PAP download was reviewed today and showed an AHI of ***/hr on *** cm H2O with ***% compliance in using more than 4 hours nightly.  The patient has been using and benefiting from PAP use and will continue to benefit from therapy.     2.  HTN - BP is well controlled on exam today.  He will continue on Prinivil 5 mg daily, Toprol XL 50 mg daily   Medication Adjustments/Labs and Tests Ordered: Current medicines are reviewed at length with the patient today.  Concerns regarding medicines are outlined above.  No orders of the defined types were placed in this encounter.  No orders of the defined types were placed in this encounter.   Signed, Fransico Him, MD  08/05/2017 9:51 AM    [place]

## 2017-08-05 NOTE — Progress Notes (Signed)
This encounter was created in error - please disregard.

## 2017-08-05 NOTE — Telephone Encounter (Signed)
-----   Message from Sueanne Margarita, MD sent at 08/05/2017 11:35 AM EDT ----- Regarding: RE: Re-initiate therapy Ok to order split night sleep study  Fransico Him ----- Message ----- From: Freada Bergeron, CMA Sent: 08/05/2017  11:19 AM To: Sueanne Margarita, MD Subject: Re-initiate therapy                            Talked to [facility] about CPAP therapy for patient. It is mentioned in your march 2019 note that the patient was not using his CPAP machine due to cough and the age of the machine. [facility] states that due to break in therapy patient will needs a split night to initiate therapies again. The only sleep study I was was a PSG from 2004. Are you ok with me ordering the split night?  Gershon Cull, [place] Sleep Coordinator

## 2017-08-08 ENCOUNTER — Encounter: Payer: Self-pay | Admitting: Family Medicine

## 2017-08-08 ENCOUNTER — Ambulatory Visit (INDEPENDENT_AMBULATORY_CARE_PROVIDER_SITE_OTHER): Payer: PPO | Admitting: Family Medicine

## 2017-08-08 VITALS — BP 122/82 | HR 57 | Temp 97.6°F | Ht 69.0 in | Wt 183.0 lb

## 2017-08-08 DIAGNOSIS — E1122 Type 2 diabetes mellitus with diabetic chronic kidney disease: Secondary | ICD-10-CM

## 2017-08-08 DIAGNOSIS — Z1211 Encounter for screening for malignant neoplasm of colon: Secondary | ICD-10-CM

## 2017-08-08 DIAGNOSIS — Z0000 Encounter for general adult medical examination without abnormal findings: Secondary | ICD-10-CM

## 2017-08-08 DIAGNOSIS — L570 Actinic keratosis: Secondary | ICD-10-CM | POA: Diagnosis not present

## 2017-08-08 DIAGNOSIS — D696 Thrombocytopenia, unspecified: Secondary | ICD-10-CM | POA: Diagnosis not present

## 2017-08-08 DIAGNOSIS — E1159 Type 2 diabetes mellitus with other circulatory complications: Secondary | ICD-10-CM | POA: Diagnosis not present

## 2017-08-08 DIAGNOSIS — I10 Essential (primary) hypertension: Secondary | ICD-10-CM

## 2017-08-08 DIAGNOSIS — N183 Chronic kidney disease, stage 3 unspecified: Secondary | ICD-10-CM

## 2017-08-08 DIAGNOSIS — Z0001 Encounter for general adult medical examination with abnormal findings: Secondary | ICD-10-CM

## 2017-08-08 DIAGNOSIS — D692 Other nonthrombocytopenic purpura: Secondary | ICD-10-CM | POA: Diagnosis not present

## 2017-08-08 DIAGNOSIS — J4530 Mild persistent asthma, uncomplicated: Secondary | ICD-10-CM | POA: Diagnosis not present

## 2017-08-08 DIAGNOSIS — I25119 Atherosclerotic heart disease of native coronary artery with unspecified angina pectoris: Secondary | ICD-10-CM

## 2017-08-08 LAB — COMPREHENSIVE METABOLIC PANEL
ALBUMIN: 4.2 g/dL (ref 3.5–5.2)
ALT: 13 U/L (ref 0–53)
AST: 14 U/L (ref 0–37)
Alkaline Phosphatase: 71 U/L (ref 39–117)
BILIRUBIN TOTAL: 0.5 mg/dL (ref 0.2–1.2)
BUN: 27 mg/dL — AB (ref 6–23)
CO2: 25 mEq/L (ref 19–32)
CREATININE: 1.87 mg/dL — AB (ref 0.40–1.50)
Calcium: 9.3 mg/dL (ref 8.4–10.5)
Chloride: 106 mEq/L (ref 96–112)
GFR: 36.86 mL/min — ABNORMAL LOW (ref >60.00)
GLUCOSE: 161 mg/dL — AB (ref 70–99)
POTASSIUM: 4.5 meq/L (ref 3.5–5.1)
SODIUM: 141 meq/L (ref 135–145)
Total Protein: 6.3 g/dL (ref 6.0–8.3)

## 2017-08-08 LAB — CBC WITH DIFFERENTIAL/PLATELET
Basophils Absolute: 0 10*3/uL (ref 0.0–0.1)
Basophils Relative: 0.9 % (ref 0.0–3.0)
EOS ABS: 0.6 10*3/uL (ref 0.0–0.7)
EOS PCT: 11.2 % — AB (ref 0.0–5.0)
HCT: 35.5 % — ABNORMAL LOW (ref 39.0–52.0)
Hemoglobin: 12.1 g/dL — ABNORMAL LOW (ref 13.0–17.0)
LYMPHS ABS: 0.9 10*3/uL (ref 0.7–4.0)
Lymphocytes Relative: 16.5 % (ref 12.0–46.0)
MCHC: 34.2 g/dL (ref 30.0–36.0)
MCV: 88.1 fl (ref 78.0–100.0)
MONO ABS: 0.4 10*3/uL (ref 0.1–1.0)
Monocytes Relative: 7.8 % (ref 3.0–12.0)
NEUTROS PCT: 63.6 % (ref 43.0–77.0)
Neutro Abs: 3.6 10*3/uL (ref 1.4–7.7)
Platelets: 137 10*3/uL — ABNORMAL LOW (ref 150.0–400.0)
RBC: 4.03 Mil/uL — AB (ref 4.22–5.81)
RDW: 15.2 % (ref 11.5–15.5)
WBC: 5.7 10*3/uL (ref 4.0–10.5)

## 2017-08-08 LAB — LDL CHOLESTEROL, DIRECT: LDL DIRECT: 58 mg/dL

## 2017-08-08 LAB — HEMOGLOBIN A1C: HEMOGLOBIN A1C: 6.5 % (ref 4.6–6.5)

## 2017-08-08 MED ORDER — ALBUTEROL SULFATE HFA 108 (90 BASE) MCG/ACT IN AERS: 2.0000 | INHALATION_SPRAY | RESPIRATORY_TRACT | 10 refills | Status: DC | PRN

## 2017-08-08 NOTE — Patient Instructions (Addendum)
Health Maintenance Due  Topic Date Due  . FOOT EXAM - Today at Office Visit 04/17/2017   Please check with your pharmacy to see if they have the shingrix vaccine. If they do- please get this immunization and update [place] by phone call or mychart with dates you receive the vaccine  Please stop by lab before you go Make sure to pick up stool cards and drop them off for [place] after competing

## 2017-08-08 NOTE — Progress Notes (Addendum)
Phone: [phone]  Subjective:  Patient presents today for their annual physical. Chief complaint-noted.   See problem oriented charting- ROS- full  review of systems was completed and negative except for: urinary frequency, dry eyes at times, joint pain, back pain, neck pain, bruises easily  The following were reviewed and entered/updated in epic: Past Medical History:  Diagnosis Date  . Anginal pain ([place])   . Arthritis    spine  . Asthma   . CAD (coronary artery disease)    s/p PCI of the D1 with cath in 2013 showing nonobstructive disease.  CP presumed due to small vessel disease.  [name] CAP (community acquired pneumonia) 12/26/2011  . Chronic kidney disease    renal insufficiency  . Diabetes mellitus, type 2 ([place])   . Fever 12/26/2011  . GERD (gastroesophageal reflux disease)   . Heart murmur   . Hyperlipidemia   . Hypertension   . Lyme disease    states had shakes that were thought to be parkinson's related but related to tick bite, states also RMSF  . Myocardial infarction ([place])   . Shortness of breath   . Sleep apnea    uses cpap   Patient Active Problem List   Diagnosis Date Noted  . Type II diabetes mellitus with renal manifestations ([place]) 09/11/2006    Priority: High  . Coronary artery disease with angina pectoris ([place]) 09/11/2006    Priority: High  . Thrombocytopenia ([place]) 02/23/2015    Priority: Medium  . Mild persistent asthma 10/30/2010    Priority: Medium  . CKD (chronic kidney disease), stage III ([place]) 02/22/2009    Priority: Medium  . Hyperlipidemia 09/11/2006    Priority: Medium  . Hypertension associated with diabetes ([place]) 09/11/2006    Priority: Medium  . Back pain 09/11/2006    Priority: Medium  . Sleep apnea 09/11/2006    Priority: Medium  . GERD (gastroesophageal reflux disease) 05/03/2014    Priority: Low  . Insomnia 05/03/2014    Priority: Low  . Allergic rhinitis 04/21/2014    Priority: Low  . Giardia 04/21/2014    Priority: Low    . NEPHROLITHIASIS, RECURRENT 01/21/2009    Priority: Low  . ACTINIC KERATOSIS, HEAD 06/14/2008    Priority: Low  . Senile purpura ([place]) 08/08/2017   Past Surgical History:  Procedure Laterality Date  . APPENDECTOMY    . CERVICAL LAMINECTOMY    . CHOLECYSTECTOMY    . KIDNEY STONE SURGERY    . LEFT HEART CATH N/A 02/15/2012   Procedure: LEFT HEART CATH;  Surgeon: Larey Dresser, MD;  Location[facility] C[facility] LAB;  Service: Cardiovascular;  Laterality: N/A;    Family History  Problem Relation Age of Onset  . ALS Father   . Asthma Daughter   . Cancer Sister        breast/liver    Medications- reviewed and updated Current Outpatient Medications  Medication Sig Dispense Refill  . acetaminophen (TYLENOL) 325 MG tablet Take 650 mg by mouth every evening. Reported on 06/28/2015    . albuterol (PROVENTIL HFA;VENTOLIN HFA) 108 (90 Base) MCG/ACT inhaler Inhale 2 puffs into the lungs every 4 (four) hours as needed for wheezing or shortness of breath. 1 Inhaler 10  . aspirin EC 81 MG tablet Take 1 tablet (81 mg total) by mouth daily.    . ASSURE COMFORT LANCETS 30G MISC 1 each by Other route daily.    [name] atorvastatin (LIPITOR) 10 MG tablet Take 1 tablet (10 mg total) by mouth  daily. 90 tablet 3  . calcium-vitamin D (OSCAL WITH D) 500-200 MG-UNIT per tablet Take 1 tablet by mouth daily.    [name] glimepiride (AMARYL) 4 MG tablet TAKE 1 & 1/2 (ONE & ONE-HALF) TABLETS BY MOUTH ONCE DAILY 90 tablet 3  . glucose blood (ACCU-CHEK AVIVA PLUS) test strip Test blood glucose daily. 100 each 12  . glucose blood test strip One Touch Ultra Test Strips 100 each 12  . isosorbide mononitrate (IMDUR) 30 MG 24 hr tablet TAKE THREE TABLETS BY MOUTH ONCE DAILY 270 tablet 3  . lisinopril (PRINIVIL,ZESTRIL) 5 MG tablet TAKE ONE-HALF TABLET BY MOUTH ONCE DAILY 30 tablet 5  . metFORMIN (GLUCOPHAGE) 500 MG tablet Take 0.5 tablets (250 mg total) by mouth daily with breakfast. 90 tablet 3  . metoprolol succinate (TOPROL-XL) 50  MG 24 hr tablet TAKE 1 TABLET BY MOUTH ONCE DAILY *TAKE  WITH  OR  IMMEDIATELY  FOLLOWING  A  MEAL* 90 tablet 2  . nitroGLYCERIN (NITROSTAT) 0.4 MG SL tablet Place 1 tablet (0.4 mg total) under the tongue every 5 (five) minutes as needed for chest pain. 25 tablet 1  . Omega-3 Fatty Acids (FISH OIL PO) Take 2 capsules by mouth 2 (two) times daily.    [name] (SYSTANE OP) Place 1 drop into both eyes 4 (four) times daily. Itchy/dry eye    . potassium citrate (UROCIT-K) 10 MEQ (1080 MG) SR tablet Take 10 mEq by mouth Twice daily.    . ranitidine (ZANTAC) 300 MG tablet TAKE 1 TABLET BY MOUTH TWICE DAILY 180 tablet 1  . ranolazine (RANEXA) 500 MG 12 hr tablet Take 1 tablet (500 mg total) by mouth 2 (two) times daily. 180 tablet 3  . SYMBICORT 160-4.5 MCG/ACT inhaler INHALE TWO PUFFS BY MOUTH TWICE DAILY 10.2 g 10  . traMADol (ULTRAM) 50 MG tablet Take 50 mg by mouth every 6 (six) hours as needed for moderate pain.    [name] zinc sulfate 220 MG capsule Take 220 mg by mouth daily.     No current facility-administered medications for this visit.     Allergies-reviewed and updated Allergies  Allergen Reactions  . Codeine Sulfate Other (See Comments)    Chest pain  . Cephalexin Rash    Social History   Social History Narrative   Married (wife patient of Dr. Yong Channel). 3 children. 4 grandchildren. 3 greatgrandchildren.       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 Oropharynx normal Neck: no thyromegaly CV: RRR no murmurs rubs or gallops Lungs: CTAB no crackles, wheeze, rhonchi Abdomen: soft/nontender/nondistended/normal bowel sounds. No rebound  or guarding.  Ext: no edema Skin: warm, dry, does have a scaly patch with erythematous base on right ear Neuro: grossly normal, moves all extremities, PERRLA  Diabetic Foot Exam - Simple   Simple Foot Form Diabetic Foot exam was performed with the following findings:  Yes 08/08/2017 10:39 AM  Visual Inspection No deformities, no ulcerations, no other skin breakdown bilaterally:  Yes Sensation Testing Intact to touch and monofilament testing bilaterally:  Yes Pulse Check  Posterior Tibialis and Dorsalis pulse intact bilaterally:  Yes Comments    Procedure note: Benefits and risks verbally discussed with patient 5-8 second freeze thaw cycle of cryotherapy performed  with liquid nitrogen to helix of right ear No complications.  Patient tolerated the procedure well other than mild pain. Gave handout on this from V[facility] kettering and we reviewed this content thoroughly [email or web address]  Assessment/Plan:  84 y.o. male presenting for annual physical.  Health Maintenance counseling: 1. Anticipatory guidance: Patient counseled regarding regular dental exams -q6 months, eye exams -yearly, wearing seatbelts.  2. Risk factor reduction:  Advised patient of need for regular exercise and diet rich and fruits and vegetables to reduce risk of heart attack and stroke. Exercise- doing some walking and staying active in his yard/garden. Diet-has tried to eat a little better and exercise-  He has lost about 8 pounds from last year-weight reasonable in overweight category. He states he has had less appetite too.   Wt Readings from Last 3 Encounters:  08/08/17 183 lb (83 kg)  08/05/17 186 lb (84.4 kg)  05/07/17 195 lb 6.4 oz (88.6 kg)  3. Immunizations/screenings/ancillary studies-- We discussed shingrix availability issues  as well as coverage issues (part D medicare)- I recommended that patient get vaccine at the pharmacy  Immunization History   Administered Date(s) Administered  . Influenza Split 12/04/2010, 01/23/2012  . Influenza Whole 12/26/2006, 11/28/2007, 11/24/2008, 12/30/2009  . Influenza, High Dose Seasonal PF 01/13/2013, 12/10/2013  . Influenza-Unspecified 10/31/2015, 11/21/2016  . Pneumococcal Conjugate-13 07/27/2014  . Pneumococcal Polysaccharide-23 06/27/2005  . Td 06/27/2005, 04/09/2017  . Zoster 10/31/2015  4. Prostate cancer screening- no more PSA testing per urology.  He continues to follow with urology for his kidney stones. Has some urinary frequency- he drinks a lot of water.  Lab Results  Component Value Date   PSA 0.78 02/14/2009   PSA 0.80 08/13/2008   PSA 0.76 03/15/2008   5. Colon cancer screening - since he has never had a colonoscopy we agreed to stool cards every 1 to 2 years until age 65.  If positive-will refer to GI. Denies blood in stool 6. Skin cancer screening-follows with dermatology every so often. advised regular sunscreen use. Denies worrisome, changing, or new skin lesions.   Status of chronic or acute concerns   Diabetes has been controlled on metformin 250 mg, Amaryl 6 mg-monitoring GFR closely as may have to stop metformin.  Update A1c today  CKD 3- GFR is remained in the 30s.  He is on low-dose ACE inhibitor in case proteinuric element- likely would stop if GFR drops below 30.  Also would refer to renal if GFR drops below 30 on 2 separate visits.  Thrombocytopenia- continue with mild thrombocytopenia above 100.  Update CBC today.  Ferritin has been normal.  Have done pathology smear review in the past which was largely reassuring.  Hypertension-controlled on Imdur 90 mg, lisinopril 5 mg, metoprolol 50 mg extended release.  There is slightly bradycardic with metoprolol  Coronary artery disease with angina-patient remains chest pain-free as long as on Ranexa and Imdur.  Not having to use nitroglycerin.  Follows with Dr. Radford Pax of cardiology.  He remains on atorvastatin 10mg  for his  hyperlipidemia-LDL goal under 70, update today  Insomnia-trazodone  150mg  at night really helps some.  Patient also deals with some chronic joint pain- cold and damp weather in particular bother him.  He uses sparing tramadol. Tylenol first  High dose zantac for reflux- prefer over PPI  Senile  purpura (HCC) Reports easy bruising/bleeding. Aspirin contributes  Mild persistent asthma Remains on symbicort twice a day. reasonable control  ACTINIC KERATOSIS, HEAD Right ear with recurrent actinic keratosis on helix- cryotherapy completed today  Future Appointments  Date Time Provider [place]  09/10/2017  3:45 PM Juanito Doom, MD [facility] None   Return in about 4 months (around 12/08/2017) for follow up- or sooner if needed.  Lab/Order associations: Preventative health care - Plan: Comprehensive metabolic panel, CBC with Differential/Platelet, LDL cholesterol, direct, Hemoglobin A1c, Fecal occult blood, imunochemical  Type 2 diabetes mellitus with stage 3 chronic kidney disease, without long-term current use of insulin ([place]) - Plan: Comprehensive metabolic panel, CBC with Differential/Platelet, LDL cholesterol, direct, Hemoglobin A1c  Thrombocytopenia ([facility]) - Plan: CBC with Differential/Platelet  Screen for colon cancer - Plan: Fecal occult blood, imunochemical  Meds ordered this encounter  Medications  . albuterol (PROVENTIL HFA;VENTOLIN HFA) 108 (90 Base) MCG/ACT inhaler    Sig: Inhale 2 puffs into the lungs every 4 (four) hours as needed for wheezing or shortness of breath.    Dispense:  1 Inhaler    Refill:  10    Return precautions advised.  [name], MD

## 2017-08-08 NOTE — Assessment & Plan Note (Signed)
Reports easy bruising/bleeding. Aspirin contributes

## 2017-08-08 NOTE — Progress Notes (Signed)
Your CBC was stablel (blood counts, infection fighting cells, platelets) with mild anemia and slightly low platelets Your CMET was stable (kidney, liver, and electrolytes, blood sugar). You have moderate reductions in kidney function but stable from over last few years.  Your cholesterol looks great  Your diabetes remains well controlled with A1c of 6.5

## 2017-08-08 NOTE — Assessment & Plan Note (Signed)
Remains on symbicort twice a day. reasonable control

## 2017-08-09 NOTE — Assessment & Plan Note (Signed)
Right ear with recurrent actinic keratosis on helix- cryotherapy completed today

## 2017-08-16 ENCOUNTER — Other Ambulatory Visit (INDEPENDENT_AMBULATORY_CARE_PROVIDER_SITE_OTHER): Payer: PPO

## 2017-08-16 DIAGNOSIS — Z1211 Encounter for screening for malignant neoplasm of colon: Secondary | ICD-10-CM

## 2017-08-16 DIAGNOSIS — Z0000 Encounter for general adult medical examination without abnormal findings: Secondary | ICD-10-CM

## 2017-08-16 LAB — FECAL OCCULT BLOOD, IMMUNOCHEMICAL: Fecal Occult Bld: POSITIVE — AB

## 2017-08-19 ENCOUNTER — Other Ambulatory Visit: Payer: Self-pay

## 2017-08-19 DIAGNOSIS — K921 Melena: Secondary | ICD-10-CM

## 2017-08-19 IMAGING — CT CT ABD-PELV W/O CM
2 of 4 series · 8 of 46 positions shown, 9 images · non-contrast
Comparison: CT of the abdomen and pelvis performed 02/15/2009, and
abdominal radiograph performed 02/08/2015

CLINICAL DATA: Acute onset of intermittent generalized abdominal
pain and vomiting. Initial encounter.

EXAM:
CT ABDOMEN AND PELVIS WITHOUT CONTRAST
TECHNIQUE: Multidetector CT imaging of the abdomen and pelvis was performed
following the standard protocol without IV contrast.

[Series 201: routine, idose (2) · axial · 0.95mm/px · z∈[+21,+411]mm · 5 of 105 slices shown, 6 images]
[im 14/105  soft-tissue]
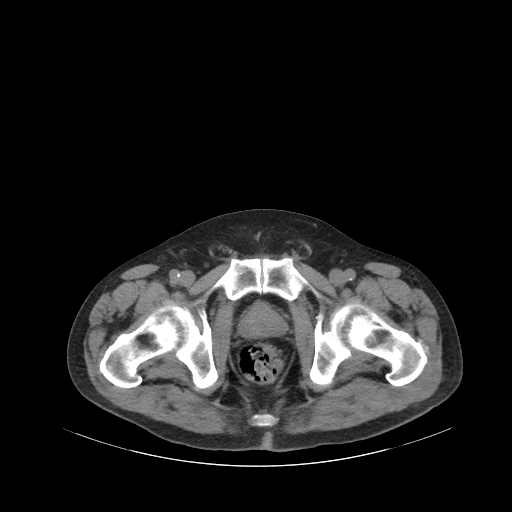
[im 14/105  bone]
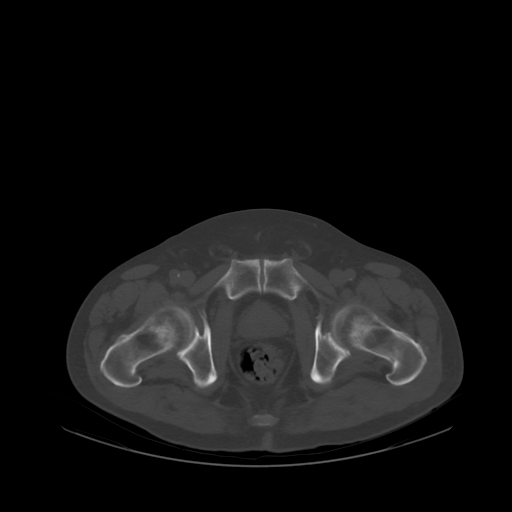
[im 31/105  soft-tissue]
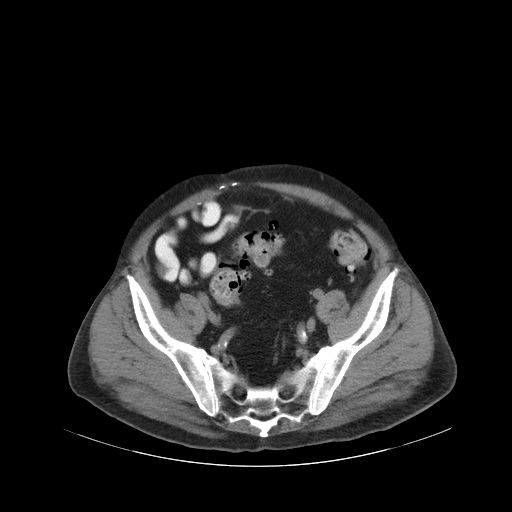
[im 53/105  soft-tissue]
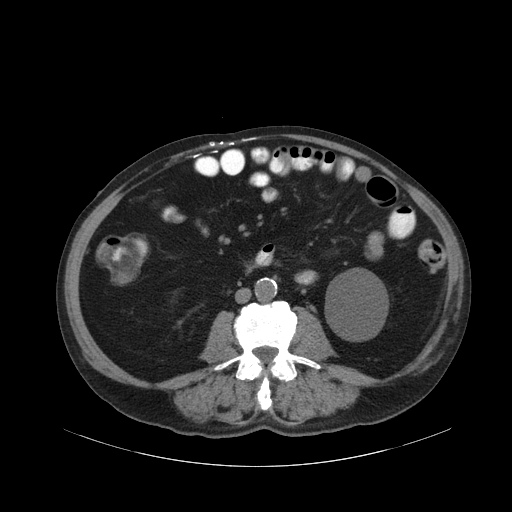
[im 74/105  soft-tissue]
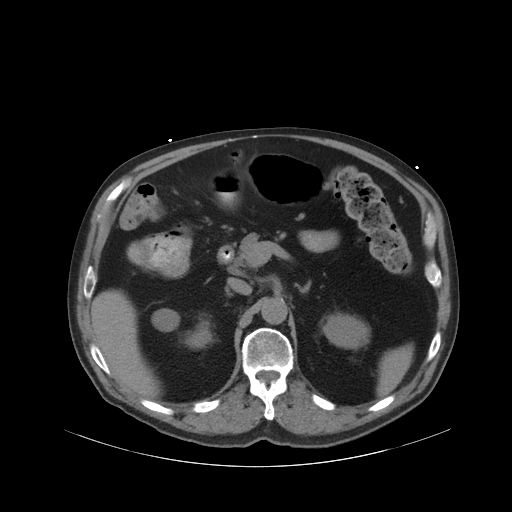
[im 92/105  soft-tissue]
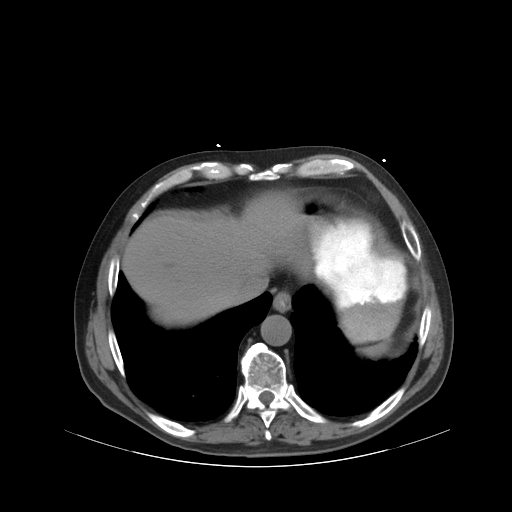

[Series 203: coronals, idose (2) · coronal · 0.45mm/px · 3 of 137 slices shown]
[im 46/137  soft-tissue]
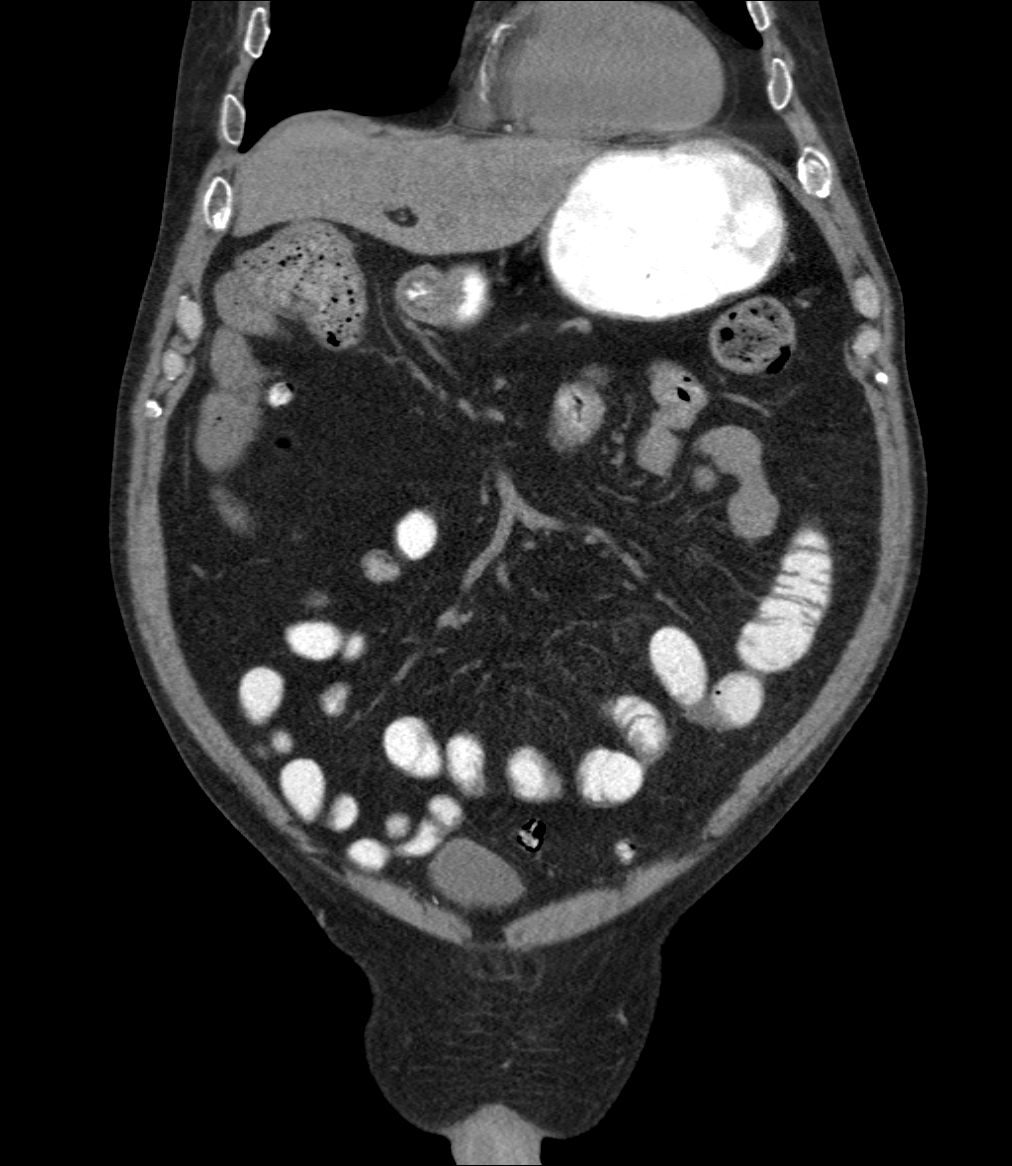
[im 61/137  soft-tissue]
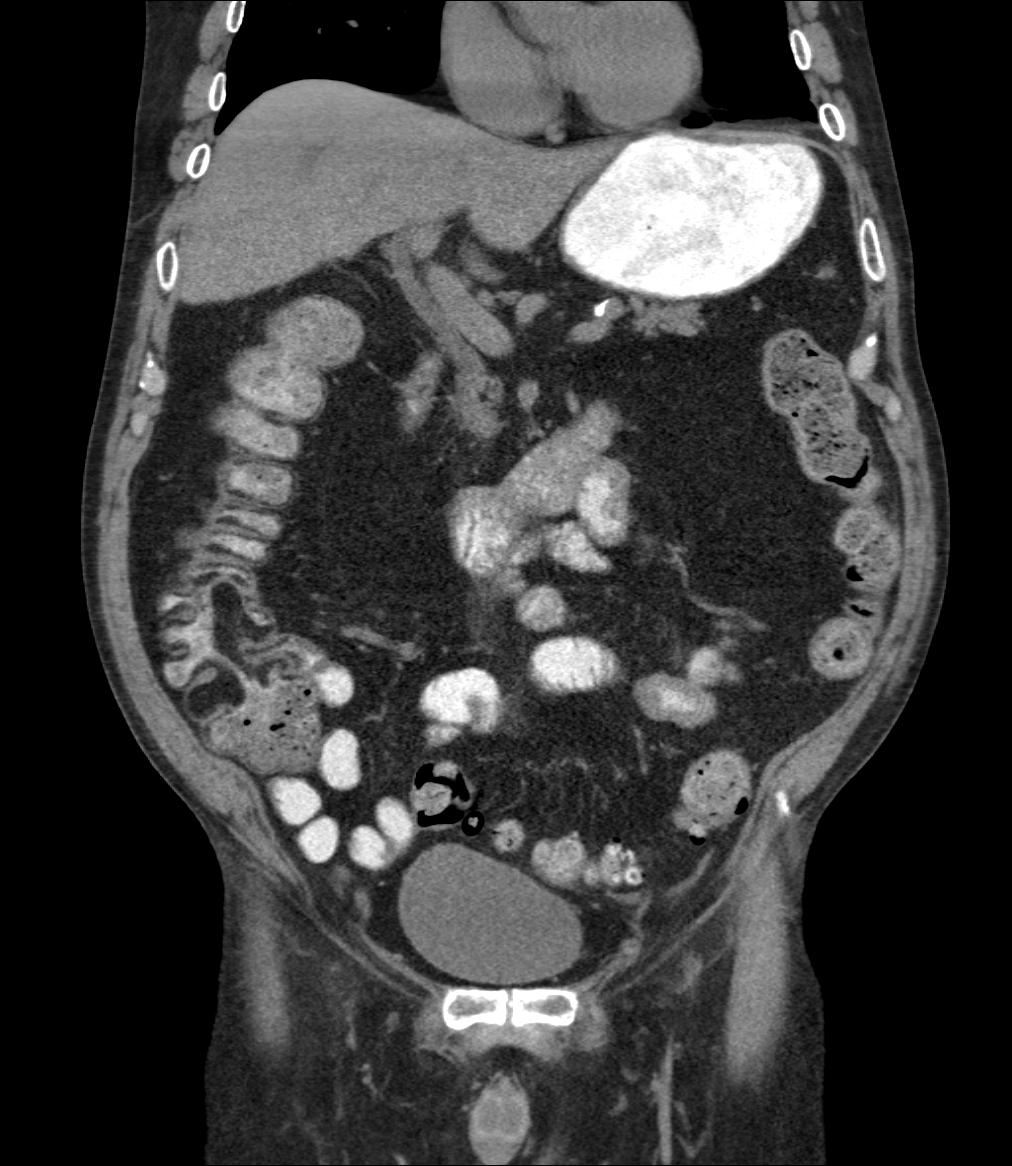
[im 76/137  soft-tissue]
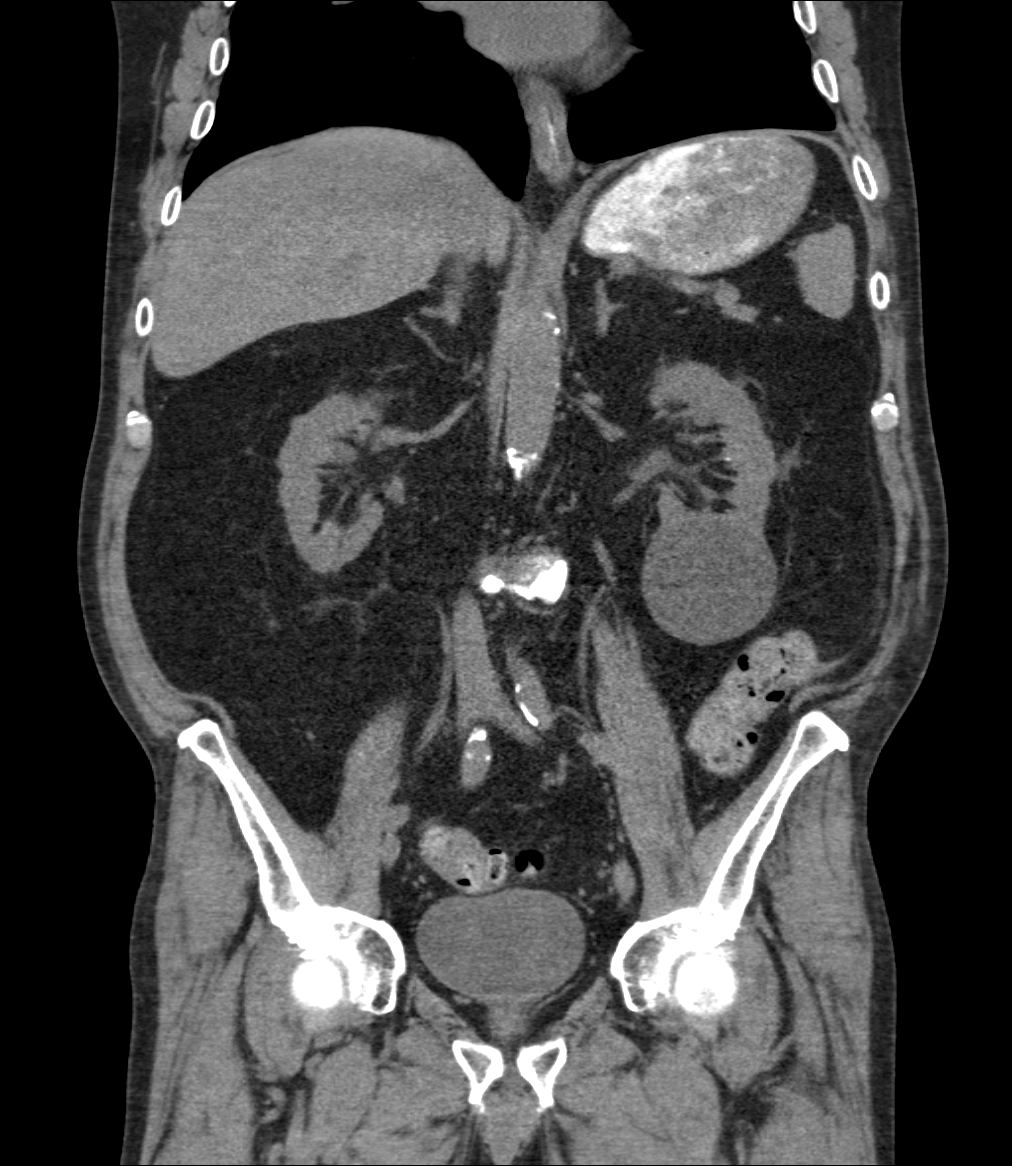

[8 of 46 positions shown; findings below may reference images not displayed]

FINDINGS: Minimal right basilar atelectasis is noted. Scattered coronary
artery calcifications are seen.

The liver and spleen are unremarkable in appearance. The patient is
status post cholecystectomy. The pancreas and adrenal glands are
unremarkable.

Nonspecific perinephric stranding is noted bilaterally. Scattered
small bilateral renal stones are seen, measuring up to 3 mm in size.
Bilateral renal cysts are seen, measuring up to 7.0 cm in size.
There is no evidence of hydronephrosis. No obstructing ureteral
stones are seen.

No free fluid is identified. The small bowel is unremarkable in
appearance. The stomach is within normal limits. No acute vascular
abnormalities are seen. Mild scattered calcification is noted along
the abdominal aorta and its branches.

Postoperative change is noted along the midline anterior abdominal
wall.

The patient is status post appendectomy. Mild diverticulosis is
noted along the transverse, descending and sigmoid colon, without
evidence of diverticulitis.

The bladder is mildly distended and grossly unremarkable. The
prostate is mildly enlarged, measuring 5.0 cm in transverse
dimension. A small left inguinal hernia is seen, containing only
fat. No inguinal lymphadenopathy is seen.

No acute osseous abnormalities are identified. Vacuum phenomenon is
noted at L5-S1.
IMPRESSION: 1. No acute abnormality seen within the abdomen or pelvis.
2. Scattered small bilateral renal stones, measuring up to 3 mm in
size. Bilateral renal cysts seen.
3. Mild scattered calcification along the abdominal aorta and its
branches.
4. Mild diverticulosis along the transverse, descending and sigmoid
colon, without evidence of diverticulitis.
5. Small left inguinal hernia, containing only fat.
6. Mildly enlarged prostate.
7. Scattered for artery calcifications seen.

## 2017-08-21 ENCOUNTER — Encounter: Payer: Self-pay | Admitting: Gastroenterology

## 2017-09-04 ENCOUNTER — Telehealth: Payer: Self-pay | Admitting: *Deleted

## 2017-09-04 NOTE — Telephone Encounter (Signed)
PA submitted via web portal to [facility] for in lab sleep study.

## 2017-09-04 NOTE — Telephone Encounter (Signed)
-----   Message from Freada Bergeron, [place] sent at 09/03/2017 12:14 PM EDT ----- Regarding: pre cert Split night

## 2017-09-10 ENCOUNTER — Ambulatory Visit (INDEPENDENT_AMBULATORY_CARE_PROVIDER_SITE_OTHER): Payer: PPO | Admitting: Pulmonary Disease

## 2017-09-10 ENCOUNTER — Encounter: Payer: Self-pay | Admitting: Pulmonary Disease

## 2017-09-10 VITALS — BP 146/90 | HR 62 | Ht 69.5 in | Wt 181.0 lb

## 2017-09-10 DIAGNOSIS — J4530 Mild persistent asthma, uncomplicated: Secondary | ICD-10-CM | POA: Diagnosis not present

## 2017-09-10 MED ORDER — BUDESONIDE-FORMOTEROL FUMARATE 160-4.5 MCG/ACT IN AERO: 2.0000 | INHALATION_SPRAY | Freq: Two times a day (BID) | RESPIRATORY_TRACT | 10 refills | Status: DC

## 2017-09-10 NOTE — Patient Instructions (Signed)
Mild persistent asthma: Continue Symbicort 2 puffs twice a day Use albuterol as needed for chest tightness wheezing or shortness of breath Practice good hand hygiene Get a flu shot in the fall, high dose  Follow-up 1 year or sooner if needed

## 2017-09-10 NOTE — Progress Notes (Signed)
Subjective:    Patient ID: Roy [name], male    DOB: [birth year], 84 y.o.   MRN: [id number]  Synopsis Chronic obstructive asthma, minimal smoking history.  Only smoked about 10 years in the 1950's.   HPI Chief Complaint  Patient presents with  . Follow-up   Ed is doing "wonderful".  He says that he has not had any breathing difficulty.  He says his wife had a stroke this year and she has been struggling some with that.  She is now getting out of the house more and she is struggling with her walking some.  He has to do a lot for her to help around the house.    No bronchitis or pneumonia in the last year.    He is still taking Symbicort routinely.  He remains active with walking for exercise, working his garden.    Past Medical History:  Diagnosis Date  . Anginal pain ([place])   . Arthritis    spine  . Asthma   . CAD (coronary artery disease)    s/p PCI of the D1 with cath in 2013 showing nonobstructive disease.  CP presumed due to small vessel disease.  [name] CAP (community acquired pneumonia) 12/26/2011  . Chronic kidney disease    renal insufficiency  . Diabetes mellitus, type 2 ([place])   . Fever 12/26/2011  . GERD (gastroesophageal reflux disease)   . Heart murmur   . Hyperlipidemia   . Hypertension   . Lyme disease    states had shakes that were thought to be parkinson's related but related to tick bite, states also RMSF  . Myocardial infarction ([place])   . Shortness of breath   . Sleep apnea    uses cpap      Review of Systems     Objective:   Physical Exam  Vitals:   09/10/17 1521  BP: (!) 146/90  Pulse: 62  SpO2: 98%  Weight: 181 lb (82.1 kg)  Height: 5' 9.5" (1.765 m)   RA  Gen: well appearing HENT: OP clear, TM's clear, neck supple PULM: CTA B, normal percussion CV: RRR, no mgr, trace edema GI: BS+, soft, nontender Derm: no cyanosis or rash Psyche: normal mood and affect    PFT 07/13/2014 pulmonary function testing> ratio 69%, FEV1 2.51 L  (87% predicted, obstruction resolved after bronchodilator and FEV1 increased to 2.58 L, 2% change with bronchodilator Total lung capacity 5.81 L (82% predicted) DLCO 20.14 (62% predicted).  Chest imaging: 07/2014 CT chest ([place]) > no ILD or bronchiectasis     Assessment & Plan:   Mild persistent asthma, uncomplicated  Discussion: This has been a stable interval for Mr. Appel.  He has not had an exacerbation of asthma or worsening shortness of breath in the last year.  His symptoms are well controlled and ordinarily I would back off on his controller medicine dose but because he has such control of his disease its best to maintain the Symbicort as prescribed.  Plan: Mild persistent asthma: Continue Symbicort 2 puffs twice a day Use albuterol as needed for chest tightness wheezing or shortness of breath Practice good hand hygiene Get a flu shot in the fall, high dose  Follow-up 1 year or sooner if needed   Current Outpatient Medications:  .  acetaminophen (TYLENOL) 325 MG tablet, Take 650 mg by mouth every evening. Reported on 06/28/2015, Disp: , Rfl:  .  albuterol (PROVENTIL HFA;VENTOLIN HFA) 108 (90 Base) MCG/ACT inhaler, Inhale 2 puffs into the  lungs every 4 (four) hours as needed for wheezing or shortness of breath., Disp: 1 Inhaler, Rfl: 10 .  aspirin EC 81 MG tablet, Take 1 tablet (81 mg total) by mouth daily., Disp: , Rfl:  .  ASSURE COMFORT LANCETS 30G MISC, 1 each by Other route daily., Disp: , Rfl:  .  atorvastatin (LIPITOR) 10 MG tablet, Take 1 tablet (10 mg total) by mouth daily., Disp: 90 tablet, Rfl: 3 .  budesonide-formoterol (SYMBICORT) 160-4.5 MCG/ACT inhaler, Inhale 2 puffs into the lungs 2 (two) times daily., Disp: 10.2 g, Rfl: 10 .  calcium-vitamin D (OSCAL WITH D) 500-200 MG-UNIT per tablet, Take 1 tablet by mouth daily., Disp: , Rfl:  .  glimepiride (AMARYL) 4 MG tablet, TAKE 1 & 1/2 (ONE & ONE-HALF) TABLETS BY MOUTH ONCE DAILY, Disp: 90 tablet, Rfl: 3 .  glucose  blood (ACCU-CHEK AVIVA PLUS) test strip, Test blood glucose daily., Disp: 100 each, Rfl: 12 .  glucose blood test strip, One Touch Ultra Test Strips, Disp: 100 each, Rfl: 12 .  isosorbide mononitrate (IMDUR) 30 MG 24 hr tablet, TAKE THREE TABLETS BY MOUTH ONCE DAILY, Disp: 270 tablet, Rfl: 3 .  lisinopril (PRINIVIL,ZESTRIL) 5 MG tablet, TAKE ONE-HALF TABLET BY MOUTH ONCE DAILY, Disp: 30 tablet, Rfl: 5 .  metFORMIN (GLUCOPHAGE) 500 MG tablet, Take 0.5 tablets (250 mg total) by mouth daily with breakfast., Disp: 90 tablet, Rfl: 3 .  metoprolol succinate (TOPROL-XL) 50 MG 24 hr tablet, TAKE 1 TABLET BY MOUTH ONCE DAILY *TAKE  WITH  OR  IMMEDIATELY  FOLLOWING  A  MEAL*, Disp: 90 tablet, Rfl: 2 .  nitroGLYCERIN (NITROSTAT) 0.4 MG SL tablet, Place 1 tablet (0.4 mg total) under the tongue every 5 (five) minutes as needed for chest pain., Disp: 25 tablet, Rfl: 1 .  Omega-3 Fatty Acids (FISH OIL PO), Take 2 capsules by mouth 2 (two) times daily., Disp: , Rfl:  .  Polyethyl Glycol-Propyl Glycol (SYSTANE OP), Place 1 drop into both eyes 4 (four) times daily. Itchy/dry eye, Disp: , Rfl:  .  potassium citrate (UROCIT-K) 10 MEQ (1080 MG) SR tablet, Take 10 mEq by mouth Twice daily., Disp: , Rfl:  .  ranitidine (ZANTAC) 300 MG tablet, TAKE 1 TABLET BY MOUTH TWICE DAILY, Disp: 180 tablet, Rfl: 1 .  ranolazine (RANEXA) 500 MG 12 hr tablet, Take 1 tablet (500 mg total) by mouth 2 (two) times daily., Disp: 180 tablet, Rfl: 3 .  traMADol (ULTRAM) 50 MG tablet, Take 50 mg by mouth every 6 (six) hours as needed for moderate pain., Disp: , Rfl:  .  zinc sulfate 220 MG capsule, Take 220 mg by mouth daily., Disp: , Rfl:

## 2017-09-17 ENCOUNTER — Telehealth: Payer: Self-pay | Admitting: *Deleted

## 2017-09-17 NOTE — Telephone Encounter (Signed)
-----   Message from Freada Bergeron, [place] sent at 09/03/2017 12:14 PM EDT ----- Regarding: pre cert Split night

## 2017-09-17 NOTE — Telephone Encounter (Signed)
Staff message sent to Tomi Bamberger received. Ok to schedule sleep study. auth information also sent to Gae Bon to link the referral. auth # [id number]. Valid 09/04/17 to 12/03/17.

## 2017-09-19 ENCOUNTER — Telehealth: Payer: Self-pay | Admitting: *Deleted

## 2017-09-19 NOTE — Addendum Note (Signed)
Addended by: Freada Bergeron on: 09/19/2017 03:32 PM   Modules accepted: Orders

## 2017-09-19 NOTE — Telephone Encounter (Signed)
Patient is scheduled for lab study on 10/15/17. Patient understands his sleep study will be done at [facility] sleep lab. Patient understands he will receive a sleep packet in a week or so. Patient understands to call if he does not receive the sleep packet in a timely manner. Patient agrees with treatment and thanked me for call.   

## 2017-09-19 NOTE — Telephone Encounter (Signed)
-----   Message from Lauralee Evener, [place] sent at 09/17/2017  3:12 PM EDT ----- Regarding: RE: pre cert [facility] auth received. Ok to schedule. auth # [id number] Valid 09/04/17 to 12/03/17. Link appointment. ----- Message ----- From: Freada Bergeron, CMA Sent: 09/03/2017  12:14 PM To: [facility] Sleep Studies Subject: pre cert                                       Split night

## 2017-10-15 ENCOUNTER — Encounter (HOSPITAL_BASED_OUTPATIENT_CLINIC_OR_DEPARTMENT_OTHER): Payer: PPO

## 2017-10-17 ENCOUNTER — Other Ambulatory Visit: Payer: Self-pay | Admitting: Family Medicine

## 2017-10-18 ENCOUNTER — Ambulatory Visit: Payer: PPO | Admitting: Gastroenterology

## 2017-11-02 ENCOUNTER — Other Ambulatory Visit: Payer: Self-pay | Admitting: Family Medicine

## 2017-11-02 DIAGNOSIS — E119 Type 2 diabetes mellitus without complications: Secondary | ICD-10-CM

## 2017-11-07 ENCOUNTER — Encounter (HOSPITAL_BASED_OUTPATIENT_CLINIC_OR_DEPARTMENT_OTHER): Payer: PPO

## 2017-12-09 ENCOUNTER — Encounter: Payer: Self-pay | Admitting: Family Medicine

## 2017-12-09 ENCOUNTER — Ambulatory Visit (INDEPENDENT_AMBULATORY_CARE_PROVIDER_SITE_OTHER): Payer: PPO | Admitting: Family Medicine

## 2017-12-09 VITALS — BP 148/70 | HR 60 | Temp 97.7°F | Ht 69.5 in | Wt 181.2 lb

## 2017-12-09 DIAGNOSIS — I10 Essential (primary) hypertension: Secondary | ICD-10-CM

## 2017-12-09 DIAGNOSIS — G8929 Other chronic pain: Secondary | ICD-10-CM

## 2017-12-09 DIAGNOSIS — E1122 Type 2 diabetes mellitus with diabetic chronic kidney disease: Secondary | ICD-10-CM | POA: Diagnosis not present

## 2017-12-09 DIAGNOSIS — N183 Chronic kidney disease, stage 3 unspecified: Secondary | ICD-10-CM

## 2017-12-09 DIAGNOSIS — M545 Low back pain: Secondary | ICD-10-CM

## 2017-12-09 DIAGNOSIS — E1159 Type 2 diabetes mellitus with other circulatory complications: Secondary | ICD-10-CM | POA: Diagnosis not present

## 2017-12-09 DIAGNOSIS — K921 Melena: Secondary | ICD-10-CM

## 2017-12-09 LAB — BASIC METABOLIC PANEL WITH GFR
BUN: 27 mg/dL — ABNORMAL HIGH (ref 6–23)
CO2: 29 meq/L (ref 19–32)
Calcium: 9.3 mg/dL (ref 8.4–10.5)
Chloride: 102 meq/L (ref 96–112)
Creatinine, Ser: 1.81 mg/dL — ABNORMAL HIGH (ref 0.40–1.50)
GFR: 38.24 mL/min — ABNORMAL LOW
Glucose, Bld: 151 mg/dL — ABNORMAL HIGH (ref 70–99)
Potassium: 4.3 meq/L (ref 3.5–5.1)
Sodium: 138 meq/L (ref 135–145)

## 2017-12-09 LAB — CBC WITH DIFFERENTIAL/PLATELET
BASOS ABS: 0 10*3/uL (ref 0.0–0.1)
Basophils Relative: 0.6 % (ref 0.0–3.0)
EOS ABS: 0.8 10*3/uL — AB (ref 0.0–0.7)
EOS PCT: 11 % — AB (ref 0.0–5.0)
HEMATOCRIT: 35.9 % — AB (ref 39.0–52.0)
HEMOGLOBIN: 12.3 g/dL — AB (ref 13.0–17.0)
LYMPHS PCT: 18.1 % (ref 12.0–46.0)
Lymphs Abs: 1.3 10*3/uL (ref 0.7–4.0)
MCHC: 34.4 g/dL (ref 30.0–36.0)
MCV: 87.8 fl (ref 78.0–100.0)
MONOS PCT: 7.7 % (ref 3.0–12.0)
Monocytes Absolute: 0.6 10*3/uL (ref 0.1–1.0)
Neutro Abs: 4.6 10*3/uL (ref 1.4–7.7)
Neutrophils Relative %: 62.6 % (ref 43.0–77.0)
Platelets: 145 10*3/uL — ABNORMAL LOW (ref 150.0–400.0)
RBC: 4.09 Mil/uL — AB (ref 4.22–5.81)
RDW: 13.5 % (ref 11.5–15.5)
WBC: 7.3 10*3/uL (ref 4.0–10.5)

## 2017-12-09 LAB — POCT GLYCOSYLATED HEMOGLOBIN (HGB A1C): Hemoglobin A1C: 5.7 % — AB (ref 4.0–5.6)

## 2017-12-09 MED ORDER — GLIMEPIRIDE 4 MG PO TABS: 4.0000 mg | ORAL_TABLET | Freq: Every day | ORAL | 3 refills | Status: DC

## 2017-12-09 MED ORDER — AMLODIPINE BESYLATE 2.5 MG PO TABS: 2.5000 mg | ORAL_TABLET | Freq: Every day | ORAL | 3 refills | Status: DC

## 2017-12-09 MED ORDER — TRAMADOL HCL 50 MG PO TABS: 50.0000 mg | ORAL_TABLET | Freq: Four times a day (QID) | ORAL | 2 refills | Status: DC | PRN

## 2017-12-09 NOTE — Assessment & Plan Note (Signed)
Chronic back pain. Should avoid nsaids. Pain worse in colder weather- will refill tramadol today

## 2017-12-09 NOTE — Progress Notes (Addendum)
Subjective:  Roy [name] is a 84 y.o. year old very pleasant male patient who presents for/with See problem oriented charting ROS- back pain worsening with change in weather. No chest pain reported. No shortness of breath reported. No edema.    Past Medical History-  Patient Active Problem List   Diagnosis Date Noted  . Type II diabetes mellitus with renal manifestations ([place]) 09/11/2006    Priority: High  . Coronary artery disease with angina pectoris ([place]) 09/11/2006    Priority: High  . Thrombocytopenia ([place]) 02/23/2015    Priority: Medium  . Mild persistent asthma 10/30/2010    Priority: Medium  . CKD (chronic kidney disease), stage III ([place]) 02/22/2009    Priority: Medium  . Hyperlipidemia 09/11/2006    Priority: Medium  . Hypertension associated with diabetes ([place]) 09/11/2006    Priority: Medium  . Back pain 09/11/2006    Priority: Medium  . Sleep apnea 09/11/2006    Priority: Medium  . GERD (gastroesophageal reflux disease) 05/03/2014    Priority: Low  . Insomnia 05/03/2014    Priority: Low  . Allergic rhinitis 04/21/2014    Priority: Low  . Giardia 04/21/2014    Priority: Low  . NEPHROLITHIASIS, RECURRENT 01/21/2009    Priority: Low  . ACTINIC KERATOSIS, HEAD 06/14/2008    Priority: Low  . Senile purpura ([place]) 08/08/2017    Medications- reviewed and updated Current Outpatient Medications  Medication Sig Dispense Refill  . acetaminophen (TYLENOL) 325 MG tablet Take 650 mg by mouth every evening. Reported on 06/28/2015    . albuterol (PROVENTIL HFA;VENTOLIN HFA) 108 (90 Base) MCG/ACT inhaler Inhale 2 puffs into the lungs every 4 (four) hours as needed for wheezing or shortness of breath. 1 Inhaler 10  . aspirin EC 81 MG tablet Take 1 tablet (81 mg total) by mouth daily.    . ASSURE COMFORT LANCETS 30G MISC 1 each by Other route daily.    [name] atorvastatin (LIPITOR) 10 MG tablet Take 1 tablet (10 mg total) by mouth daily. 90 tablet 3  . budesonide-formoterol  (SYMBICORT) 160-4.5 MCG/ACT inhaler Inhale 2 puffs into the lungs 2 (two) times daily. 10.2 g 10  . calcium-vitamin D (OSCAL WITH D) 500-200 MG-UNIT per tablet Take 1 tablet by mouth daily.    [name] glimepiride (AMARYL) 4 MG tablet Take 1 tablet (4 mg total) by mouth daily with breakfast. 90 tablet 3  . glucose blood (ACCU-CHEK AVIVA PLUS) test strip Test blood glucose daily. 100 each 12  . glucose blood test strip One Touch Ultra Test Strips 100 each 12  . isosorbide mononitrate (IMDUR) 30 MG 24 hr tablet TAKE THREE TABLETS BY MOUTH ONCE DAILY 270 tablet 3  . lisinopril (PRINIVIL,ZESTRIL) 5 MG tablet TAKE ONE-HALF TABLET BY MOUTH ONCE DAILY 30 tablet 5  . metFORMIN (GLUCOPHAGE) 500 MG tablet Take 0.5 tablets (250 mg total) by mouth daily with breakfast. 90 tablet 3  . metoprolol succinate (TOPROL-XL) 50 MG 24 hr tablet TAKE 1 TABLET BY MOUTH ONCE DAILY. TAKE WITH OR IMMEDIATELY FOLLOWING A MEAL. 90 tablet 0  . nitroGLYCERIN (NITROSTAT) 0.4 MG SL tablet Place 1 tablet (0.4 mg total) under the tongue every 5 (five) minutes as needed for chest pain. 25 tablet 1  . Omega-3 Fatty Acids (FISH OIL PO) Take 2 capsules by mouth 2 (two) times daily.    [name] (SYSTANE OP) Place 1 drop into both eyes 4 (four) times daily. Itchy/dry eye    . potassium citrate (  UROCIT-K) 10 MEQ (1080 MG) SR tablet Take 10 mEq by mouth Twice daily.    . ranitidine (ZANTAC) 300 MG tablet TAKE 1 TABLET BY MOUTH TWICE DAILY 180 tablet 1  . ranolazine (RANEXA) 500 MG 12 hr tablet Take 1 tablet (500 mg total) by mouth 2 (two) times daily. 180 tablet 3  . traMADol (ULTRAM) 50 MG tablet Take 50 mg by mouth every 6 (six) hours as needed for moderate pain.    [name] zinc sulfate 220 MG capsule Take 220 mg by mouth daily.    [name] amLODipine (NORVASC) 2.5 MG tablet Take 1 tablet (2.5 mg total) by mouth daily. 90 tablet 3   No current facility-administered medications for this visit.     Objective: BP (!) 148/70   Pulse  60   Temp 97.7 F (36.5 C) (Oral)   Ht 5' 9.5" (1.765 m)   Wt 181 lb 3.2 oz (82.2 kg)   SpO2 96%   BMI 26.37 kg/m  Gen: NAD, resting comfortably CV: RRR no murmurs rubs or gallops Lungs: CTAB no crackles, wheeze, rhonchi Abdomen: soft/nontender/nondistended/normal bowel sounds. Ext: no edema Skin: warm, dry Neuro: speechnormal, moves all extremities  Assessment/Plan:  Other notes: 1.  Constipation and had some hemorrhoids- thinks blood in stool from that. He opted not to follow through with GI referral. Hemorrhoids have calmed down- no recent blood in stool- agrees to do stool cards again. We also discussed if anemia is worsening may also refer to GI.  2. Handicap placard for back pain- walking will trigger it with prolonged walking at times  Type II diabetes mellitus with renal manifestations ([facility]) S:  controlled on metformin 250mg , amaryl 6 mg. Have to monitor GFR closely with his metformin. Denies low blood sugars.  Lab Results  Component Value Date   HGBA1C 6.5 08/08/2017   HGBA1C 6.7 (H) 04/09/2017   HGBA1C 6.9 (H) 12/07/2016  A/P: a1c remains controlled today- Has lost 2 lbs- a1c trending down. Will reduce amaryl to 4mg  to try to avoid potential for low CBGs.   CKD (chronic kidney disease), stage III S: CKD III with GFR in 30s. On low dose ace inhibitor in case proteinuric element. Knows to avoid nsaids  A/P: update creatinine/GFR and continue risk factor modification. '  Hypertension associated with diabetes ([place]) S: controlled on imdur 90 mg, lisinopril 5 mg, metoprolol 50mg  XR BP Readings from Last 3 Encounters:  12/09/17 (!) 148/70  09/10/17 (!) 146/90  08/08/17 122/82  A/P: We discussed blood pressure goal of <140/90. Continue current meds:  But add amlodipine 2.5 mg- im hesitant due to age but with kidney function think we need to be more aggressive  Back pain Chronic back pain. Should avoid nsaids. Pain worse in colder weather- will refill tramadol today    Future Appointments  Date Time Provider Ruby  01/09/2018  9:00 AM Marin Olp, MD [facility]   Lab/Order associations: Type 2 diabetes mellitus with stage 3 chronic kidney disease, without long-term current use of insulin ([place]) - Plan: POCT glycosylated hemoglobin (Hb A1C), CBC with Differential/Platelet, Basic metabolic panel  Controlled type 2 diabetes mellitus without complication, without long-term current use of insulin ([facility]) - Plan: glimepiride (AMARYL) 4 MG tablet  Blood in stool - Plan: Fecal occult blood, imunochemical  CKD (chronic kidney disease), stage III ([facility])  Hypertension associated with diabetes ([place])  Chronic bilateral low back pain without sciatica  Meds ordered this encounter  Medications  . DISCONTD: glimepiride (AMARYL) 4  MG tablet    Sig: Take 1 tablet (4 mg total) by mouth daily with breakfast.    Dispense:  90 tablet    Refill:  3    Please consider 90 day supplies to promote better adherence  . amLODipine (NORVASC) 2.5 MG tablet    Sig: Take 1 tablet (2.5 mg total) by mouth daily.    Dispense:  90 tablet    Refill:  3  . glimepiride (AMARYL) 4 MG tablet    Sig: Take 1 tablet (4 mg total) by mouth daily with breakfast.    Dispense:  90 tablet    Refill:  3  . traMADol (ULTRAM) 50 MG tablet    Sig: Take 1 tablet (50 mg total) by mouth every 6 (six) hours as needed for moderate pain (twice per day maximum).    Dispense:  60 tablet    Refill:  2    Return precautions advised.  [name], MD

## 2017-12-09 NOTE — Assessment & Plan Note (Signed)
S: controlled on imdur 90 mg, lisinopril 5 mg, metoprolol 50mg  XR BP Readings from Last 3 Encounters:  12/09/17 (!) 148/70  09/10/17 (!) 146/90  08/08/17 122/82  A/P: We discussed blood pressure goal of <140/90. Continue current meds:  But add amlodipine 2.5 mg- im hesitant due to age but with kidney function think we need to be more aggressive

## 2017-12-09 NOTE — Patient Instructions (Addendum)
A1c looks great. Lets cut the glimepride down to 4mg  which is just one pill.   Start amlodipine 2.5mg  in addition to other blood pressure medicines. Follow up in about a month. Let me know if you have symptoms like too much lightheadedness or dizziness . This can cause ankle swelling so let me know if that happens.   Please stop by lab before you go Pick up stool cards from lab as well

## 2017-12-09 NOTE — Assessment & Plan Note (Signed)
S: CKD III with GFR in 30s. On low dose ace inhibitor in case proteinuric element. Knows to avoid nsaids  A/P: update creatinine/GFR and continue risk factor modification. '

## 2017-12-09 NOTE — Addendum Note (Signed)
Addended by: Marin Olp on: 12/09/2017 11:04 AM   Modules accepted: Orders

## 2017-12-09 NOTE — Assessment & Plan Note (Signed)
S:  controlled on metformin 250mg , amaryl 6 mg. Have to monitor GFR closely with his metformin. Denies low blood sugars.  Lab Results  Component Value Date   HGBA1C 6.5 08/08/2017   HGBA1C 6.7 (H) 04/09/2017   HGBA1C 6.9 (H) 12/07/2016  A/P: a1c remains controlled today- Has lost 2 lbs- a1c trending down. Will reduce amaryl to 4mg  to try to avoid potential for low CBGs.

## 2017-12-18 ENCOUNTER — Other Ambulatory Visit (INDEPENDENT_AMBULATORY_CARE_PROVIDER_SITE_OTHER): Payer: PPO

## 2017-12-18 DIAGNOSIS — K921 Melena: Secondary | ICD-10-CM

## 2017-12-18 LAB — FECAL OCCULT BLOOD, IMMUNOCHEMICAL: Fecal Occult Bld: NEGATIVE

## 2017-12-31 ENCOUNTER — Other Ambulatory Visit: Payer: Self-pay | Admitting: Family Medicine

## 2018-01-08 ENCOUNTER — Other Ambulatory Visit: Payer: Self-pay | Admitting: Family Medicine

## 2018-01-09 ENCOUNTER — Encounter: Payer: Self-pay | Admitting: Family Medicine

## 2018-01-09 ENCOUNTER — Ambulatory Visit (INDEPENDENT_AMBULATORY_CARE_PROVIDER_SITE_OTHER): Payer: PPO | Admitting: Family Medicine

## 2018-01-09 VITALS — BP 130/59 | HR 61 | Temp 97.5°F | Ht 69.5 in | Wt 184.4 lb

## 2018-01-09 DIAGNOSIS — Z6826 Body mass index (BMI) 26.0-26.9, adult: Secondary | ICD-10-CM | POA: Diagnosis not present

## 2018-01-09 DIAGNOSIS — E1122 Type 2 diabetes mellitus with diabetic chronic kidney disease: Secondary | ICD-10-CM

## 2018-01-09 DIAGNOSIS — I152 Hypertension secondary to endocrine disorders: Secondary | ICD-10-CM

## 2018-01-09 DIAGNOSIS — N183 Chronic kidney disease, stage 3 (moderate): Secondary | ICD-10-CM | POA: Diagnosis not present

## 2018-01-09 DIAGNOSIS — E1159 Type 2 diabetes mellitus with other circulatory complications: Secondary | ICD-10-CM | POA: Diagnosis not present

## 2018-01-09 DIAGNOSIS — I10 Essential (primary) hypertension: Secondary | ICD-10-CM | POA: Diagnosis not present

## 2018-01-09 NOTE — Patient Instructions (Addendum)
Health Maintenance Due  Topic Date Due  . OPHTHALMOLOGY EXAM -glad you got your eye exam rescheduled.  Please have them send me a copy of this once to get it completed 01/03/2018   Your blood pressure looks great today on the new medicine-continue all of your current medicines  Glad your blood sugars have been looking good still when reducing the glimepiride/Amaryl to 4 mg  Lets schedule a visit in 3 or 4 months to recheck on your diabetes and your blood pressure

## 2018-01-09 NOTE — Assessment & Plan Note (Signed)
S:  controlled on metformin 250 mg and Amaryl 6 mg last visit.  We reduced Amaryl to 4 mg to try to avoid low blood sugars.  Lab Results  Component Value Date   HGBA1C 5.7 (A) 12/09/2017   HGBA1C 6.5 08/08/2017   HGBA1C 6.7 (H) 04/09/2017  A/P: stable- continue metformin 250mg  and amaryl 4 mg

## 2018-01-09 NOTE — Progress Notes (Signed)
Subjective:  Roy [name] is a 84 y.o. year old very pleasant male patient who presents for/with See problem oriented charting ROS- No chest pain or shortness of breath. No headache or blurry vision. Some neck and shoulder pain (has had shot in the past and helpful) - tramadol helping  Past Medical History-  Patient Active Problem List   Diagnosis Date Noted  . Type II diabetes mellitus with renal manifestations ([place]) 09/11/2006    Priority: High  . Coronary artery disease with angina pectoris ([place]) 09/11/2006    Priority: High  . Thrombocytopenia ([place]) 02/23/2015    Priority: Medium  . Mild persistent asthma 10/30/2010    Priority: Medium  . CKD (chronic kidney disease), stage III ([place]) 02/22/2009    Priority: Medium  . Hyperlipidemia 09/11/2006    Priority: Medium  . Hypertension associated with diabetes ([place]) 09/11/2006    Priority: Medium  . Back pain 09/11/2006    Priority: Medium  . Sleep apnea 09/11/2006    Priority: Medium  . GERD (gastroesophageal reflux disease) 05/03/2014    Priority: Low  . Insomnia 05/03/2014    Priority: Low  . Allergic rhinitis 04/21/2014    Priority: Low  . Giardia 04/21/2014    Priority: Low  . NEPHROLITHIASIS, RECURRENT 01/21/2009    Priority: Low  . ACTINIC KERATOSIS, HEAD 06/14/2008    Priority: Low  . Senile purpura ([place]) 08/08/2017    Medications- reviewed and updated Current Outpatient Medications  Medication Sig Dispense Refill  . acetaminophen (TYLENOL) 325 MG tablet Take 650 mg by mouth every evening. Reported on 06/28/2015    . albuterol (PROVENTIL HFA;VENTOLIN HFA) 108 (90 Base) MCG/ACT inhaler Inhale 2 puffs into the lungs every 4 (four) hours as needed for wheezing or shortness of breath. 1 Inhaler 10  . amLODipine (NORVASC) 2.5 MG tablet Take 1 tablet (2.5 mg total) by mouth daily. 90 tablet 3  . aspirin EC 81 MG tablet Take 1 tablet (81 mg total) by mouth daily.    . ASSURE COMFORT LANCETS 30G MISC 1 each by Other route  daily.    [name] atorvastatin (LIPITOR) 10 MG tablet Take 1 tablet (10 mg total) by mouth daily. 90 tablet 3  . budesonide-formoterol (SYMBICORT) 160-4.5 MCG/ACT inhaler Inhale 2 puffs into the lungs 2 (two) times daily. 10.2 g 10  . calcium-vitamin D (OSCAL WITH D) 500-200 MG-UNIT per tablet Take 1 tablet by mouth daily.    [name] glimepiride (AMARYL) 4 MG tablet Take 1 tablet (4 mg total) by mouth daily with breakfast. 90 tablet 3  . glucose blood (ACCU-CHEK AVIVA PLUS) test strip Test blood glucose daily. 100 each 12  . glucose blood test strip One Touch Ultra Test Strips 100 each 12  . isosorbide mononitrate (IMDUR) 30 MG 24 hr tablet TAKE 3 TABLETS BY MOUTH ONCE DAILY 270 tablet 3  . lisinopril (PRINIVIL,ZESTRIL) 5 MG tablet TAKE ONE-HALF TABLET BY MOUTH ONCE DAILY 30 tablet 5  . metFORMIN (GLUCOPHAGE) 500 MG tablet Take 0.5 tablets (250 mg total) by mouth daily with breakfast. 90 tablet 3  . metoprolol succinate (TOPROL-XL) 50 MG 24 hr tablet TAKE 1 TABLET BY MOUTH ONCE DAILY (TAKE  WITH  OR  IMMEDIATELY  FOLLOWING  A  MEAL) 90 tablet 2  . nitroGLYCERIN (NITROSTAT) 0.4 MG SL tablet Place 1 tablet (0.4 mg total) under the tongue every 5 (five) minutes as needed for chest pain. 25 tablet 1  . Omega-3 Fatty Acids (FISH OIL PO) Take 2 capsules  by mouth 2 (two) times daily.    [name] (SYSTANE OP) Place 1 drop into both eyes 4 (four) times daily. Itchy/dry eye    . potassium citrate (UROCIT-K) 10 MEQ (1080 MG) SR tablet Take 10 mEq by mouth Twice daily.    . ranitidine (ZANTAC) 300 MG tablet TAKE 1 TABLET BY MOUTH TWICE DAILY 180 tablet 1  . ranolazine (RANEXA) 500 MG 12 hr tablet Take 1 tablet (500 mg total) by mouth 2 (two) times daily. 180 tablet 3  . traMADol (ULTRAM) 50 MG tablet Take 1 tablet (50 mg total) by mouth every 6 (six) hours as needed for moderate pain (twice per day maximum). 60 tablet 2  . zinc sulfate 220 MG capsule Take 220 mg by mouth daily.     Objective: BP  (!) 130/59 (BP Location: Left Arm, Patient Position: Sitting, Cuff Size: Large)   Pulse 61   Temp (!) 97.5 F (36.4 C) (Oral)   Ht 5' 9.5" (1.765 m)   Wt 184 lb 6.4 oz (83.6 kg)   SpO2 96%   BMI 26.84 kg/m  Gen: NAD, resting comfortably CV: RRR no murmurs rubs or gallops Lungs: CTAB no crackles, wheeze, rhonchi Abdomen: soft/nontender Ext: no edema Skin: warm, dry Neuro: speech normal, moves all extremities  Assessment/Plan:  Type II diabetes mellitus with renal manifestations (HCC) S:  controlled on metformin 250 mg and Amaryl 6 mg last visit.  We reduced Amaryl to 4 mg to try to avoid low blood sugars.  Lab Results  Component Value Date   HGBA1C 5.7 (A) 12/09/2017   HGBA1C 6.5 08/08/2017   HGBA1C 6.7 (H) 04/09/2017  A/P: stable- continue metformin 250mg  and amaryl 4 mg  Hypertension associated with diabetes ([place]) S: controlled with recent addition of amlodipine 2.5 mg to prior regimen of Imdur 90 mg, lisinopril 5 mg, metoprolol 50 mg extended release. No lightheadedness or dizziness  With his age do not want low blood pressure but with GFR in low 30s-want to make sure not to lose more nephrons based off for blood pressure control BP Readings from Last 3 Encounters:  01/09/18 (!) 130/59  12/09/17 (!) 148/70  09/10/17 (!) 146/90  A/P: We discussed blood pressure goal of <140/90. Continue current meds:  Thrilled with improvement and no orthostatic symptoms     I think his BMI is in a reasonable position given his age and comorbidity- I would encourage him to exercise regularly and eat healthy but not focus so much on weight loss.   Future Appointments  Date Time Provider [place]  04/16/2018  9:20 AM Marin Olp, MD [facility]   Lab/Order associations: Body mass index 26.0-26.9, adult  Type 2 diabetes mellitus with stage 3 chronic kidney disease, without long-term current use of insulin ([place])  Hypertension associated with diabetes ([place])  Return  precautions advised.  [name], MD

## 2018-01-09 NOTE — Assessment & Plan Note (Signed)
S: controlled with recent addition of amlodipine 2.5 mg to prior regimen of Imdur 90 mg, lisinopril 5 mg, metoprolol 50 mg extended release. No lightheadedness or dizziness  With his age do not want low blood pressure but with GFR in low 30s-want to make sure not to lose more nephrons based off for blood pressure control BP Readings from Last 3 Encounters:  01/09/18 (!) 130/59  12/09/17 (!) 148/70  09/10/17 (!) 146/90  A/P: We discussed blood pressure goal of <140/90. Continue current meds:  Thrilled with improvement and no orthostatic symptoms

## 2018-01-13 DIAGNOSIS — H353131 Nonexudative age-related macular degeneration, bilateral, early dry stage: Secondary | ICD-10-CM | POA: Diagnosis not present

## 2018-01-13 DIAGNOSIS — Z7984 Long term (current) use of oral hypoglycemic drugs: Secondary | ICD-10-CM | POA: Diagnosis not present

## 2018-01-13 DIAGNOSIS — H40003 Preglaucoma, unspecified, bilateral: Secondary | ICD-10-CM | POA: Diagnosis not present

## 2018-01-13 DIAGNOSIS — E119 Type 2 diabetes mellitus without complications: Secondary | ICD-10-CM | POA: Diagnosis not present

## 2018-01-13 DIAGNOSIS — H04123 Dry eye syndrome of bilateral lacrimal glands: Secondary | ICD-10-CM | POA: Diagnosis not present

## 2018-01-13 DIAGNOSIS — H40013 Open angle with borderline findings, low risk, bilateral: Secondary | ICD-10-CM | POA: Diagnosis not present

## 2018-01-13 DIAGNOSIS — Z961 Presence of intraocular lens: Secondary | ICD-10-CM | POA: Diagnosis not present

## 2018-01-13 LAB — HM DIABETES EYE EXAM

## 2018-01-17 ENCOUNTER — Other Ambulatory Visit: Payer: Self-pay | Admitting: Family Medicine

## 2018-01-17 ENCOUNTER — Encounter: Payer: Self-pay | Admitting: Family Medicine

## 2018-01-24 ENCOUNTER — Other Ambulatory Visit: Payer: Self-pay | Admitting: Family Medicine

## 2018-01-24 ENCOUNTER — Other Ambulatory Visit: Payer: Self-pay

## 2018-01-24 MED ORDER — FAMOTIDINE 20 MG PO TABS: 20.0000 mg | ORAL_TABLET | Freq: Two times a day (BID) | ORAL | 1 refills | Status: DC

## 2018-03-01 ENCOUNTER — Encounter: Payer: Self-pay | Admitting: Emergency Medicine

## 2018-03-01 ENCOUNTER — Other Ambulatory Visit: Payer: Self-pay

## 2018-03-01 ENCOUNTER — Emergency Department
Admission: EM | Admit: 2018-03-01 | Discharge: 2018-03-01 | Disposition: A | Discharge status: Home / Self Care | Payer: PPO | Attending: Emergency Medicine | Admitting: Emergency Medicine

## 2018-03-01 DIAGNOSIS — S41111A Laceration without foreign body of right upper arm, initial encounter: Secondary | ICD-10-CM | POA: Diagnosis not present

## 2018-03-01 DIAGNOSIS — S51851A Open bite of right forearm, initial encounter: Secondary | ICD-10-CM | POA: Diagnosis not present

## 2018-03-01 DIAGNOSIS — Z7982 Long term (current) use of aspirin: Secondary | ICD-10-CM | POA: Insufficient documentation

## 2018-03-01 DIAGNOSIS — N183 Chronic kidney disease, stage 3 (moderate): Secondary | ICD-10-CM | POA: Diagnosis not present

## 2018-03-01 DIAGNOSIS — Z79899 Other long term (current) drug therapy: Secondary | ICD-10-CM | POA: Insufficient documentation

## 2018-03-01 DIAGNOSIS — I129 Hypertensive chronic kidney disease with stage 1 through stage 4 chronic kidney disease, or unspecified chronic kidney disease: Secondary | ICD-10-CM | POA: Insufficient documentation

## 2018-03-01 DIAGNOSIS — I2510 Atherosclerotic heart disease of native coronary artery without angina pectoris: Secondary | ICD-10-CM | POA: Insufficient documentation

## 2018-03-01 DIAGNOSIS — Z87891 Personal history of nicotine dependence: Secondary | ICD-10-CM | POA: Insufficient documentation

## 2018-03-01 DIAGNOSIS — W5531XA Bitten by other hoof stock, initial encounter: Secondary | ICD-10-CM | POA: Diagnosis not present

## 2018-03-01 DIAGNOSIS — Y939 Activity, unspecified: Secondary | ICD-10-CM | POA: Insufficient documentation

## 2018-03-01 DIAGNOSIS — J45909 Unspecified asthma, uncomplicated: Secondary | ICD-10-CM | POA: Insufficient documentation

## 2018-03-01 DIAGNOSIS — Y929 Unspecified place or not applicable: Secondary | ICD-10-CM | POA: Diagnosis not present

## 2018-03-01 DIAGNOSIS — Z7984 Long term (current) use of oral hypoglycemic drugs: Secondary | ICD-10-CM | POA: Diagnosis not present

## 2018-03-01 DIAGNOSIS — E1122 Type 2 diabetes mellitus with diabetic chronic kidney disease: Secondary | ICD-10-CM | POA: Insufficient documentation

## 2018-03-01 DIAGNOSIS — T148XXA Other injury of unspecified body region, initial encounter: Secondary | ICD-10-CM

## 2018-03-01 DIAGNOSIS — Y999 Unspecified external cause status: Secondary | ICD-10-CM | POA: Insufficient documentation

## 2018-03-01 MED ORDER — SILVER NITRATE-POT NITRATE 75-25 % EX MISC
1.0000 | Freq: Once | CUTANEOUS | Status: DC
  Filled 2018-03-01: qty 1

## 2018-03-01 MED ORDER — CLINDAMYCIN HCL 150 MG PO CAPS: 300.0000 mg | ORAL_CAPSULE | Freq: Once | ORAL | Status: DC

## 2018-03-01 MED ORDER — CLINDAMYCIN HCL 300 MG PO CAPS: 300.0000 mg | ORAL_CAPSULE | Freq: Four times a day (QID) | ORAL | 0 refills | Status: AC

## 2018-03-01 MED ORDER — SODIUM CHLORIDE 0.9 % IV SOLN
3.0000 g | Freq: Once | INTRAVENOUS | Status: AC
  Administered 2018-03-01: 3 g via INTRAVENOUS
  Filled 2018-03-01: qty 3

## 2018-03-01 MED ORDER — CLINDAMYCIN PHOSPHATE 300 MG/2ML IJ SOLN
300.0000 mg | Freq: Once | INTRAMUSCULAR | Status: AC
  Administered 2018-03-01: 300 mg via INTRAMUSCULAR
  Filled 2018-03-01: qty 2

## 2018-03-01 MED ORDER — LIDOCAINE-EPINEPHRINE-TETRACAINE (LET) SOLUTION
6.0000 mL | Freq: Once | NASAL | Status: DC
  Filled 2018-03-01: qty 6

## 2018-03-01 NOTE — Discharge Instructions (Addendum)
Please take clindamycin 4 times a day to prevent infection.  You may take 3 more doses today.  Please return to the emergency department immediately if you notice that wound is bleeding.  Please follow-up with primary care/wound care/this department in 2 days to have wound reevaluated.

## 2018-03-01 NOTE — ED Provider Notes (Signed)
[facility] Emergency Department Provider Note  ____________________________________________  Time seen: Approximately 10:05 AM  I have reviewed the triage vital signs and the nursing notes.   HISTORY  Chief Complaint Animal Bite    HPI Roy [name] is a 84 y.o. male presents emergency department for evaluation of donkey bite.  Patient states that he went out to feed his dog he that he has had for a long time.  He feeds this donkey twice a day.  Patient states that he reached out his hand to feed him and the donkey grabbed his right arm.  After incident, patient washed out his arm with water and cleaned it with Betadine.  Patient's last tetanus shot was a couple of months ago.  Patient has diabetes, which is controlled and last A1c was five-point something.  He states that he has a vascular.  Past Medical History:  Diagnosis Date  . Anginal pain ([place])   . Arthritis    spine  . Asthma   . CAD (coronary artery disease)    s/p PCI of the D1 with cath in 2013 showing nonobstructive disease.  CP presumed due to small vessel disease.  [name] CAP (community acquired pneumonia) 12/26/2011  . Chronic kidney disease    renal insufficiency  . Diabetes mellitus, type 2 ([place])   . Fever 12/26/2011  . GERD (gastroesophageal reflux disease)   . Heart murmur   . Hyperlipidemia   . Hypertension   . Lyme disease    states had shakes that were thought to be parkinson's related but related to tick bite, states also RMSF  . Myocardial infarction ([place])   . Shortness of breath   . Sleep apnea    uses cpap    Patient Active Problem List   Diagnosis Date Noted  . Senile purpura ([place]) 08/08/2017  . Thrombocytopenia ([place]) 02/23/2015  . GERD (gastroesophageal reflux disease) 05/03/2014  . Insomnia 05/03/2014  . Allergic rhinitis 04/21/2014  . Giardia 04/21/2014  . Mild persistent asthma 10/30/2010  . CKD (chronic kidney disease), stage III ([place]) 02/22/2009  .  NEPHROLITHIASIS, RECURRENT 01/21/2009  . ACTINIC KERATOSIS, HEAD 06/14/2008  . Type II diabetes mellitus with renal manifestations ([place]) 09/11/2006  . Hyperlipidemia 09/11/2006  . Hypertension associated with diabetes ([place]) 09/11/2006  . Coronary artery disease with angina pectoris ([place]) 09/11/2006  . Back pain 09/11/2006  . Sleep apnea 09/11/2006    Past Surgical History:  Procedure Laterality Date  . APPENDECTOMY    . CERVICAL LAMINECTOMY    . CHOLECYSTECTOMY    . KIDNEY STONE SURGERY    . LEFT HEART CATH N/A 02/15/2012   Procedure: LEFT HEART CATH;  Surgeon: Larey Dresser, MD;  Location: [facility] CATH LAB;  Service: Cardiovascular;  Laterality: N/A;    Prior to Admission medications   Medication Sig Start Date End Date Taking? Authorizing Provider  acetaminophen (TYLENOL) 325 MG tablet Take 650 mg by mouth every evening. Reported on 06/28/2015    [provider]  albuterol (PROVENTIL HFA;VENTOLIN HFA) 108 (90 Base) MCG/ACT inhaler Inhale 2 puffs into the lungs every 4 (four) hours as needed for wheezing or shortness of breath. 08/08/17   Marin Olp, MD  amLODipine (NORVASC) 2.5 MG tablet Take 1 tablet (2.5 mg total) by mouth daily. 12/09/17   Marin Olp, MD  aspirin EC 81 MG tablet Take 1 tablet (81 mg total) by mouth daily. 03/11/12   Larey Dresser, MD  ASSURE COMFORT LANCETS 30G [place]  1 each by Other route daily. 12/02/13   [provider]  atorvastatin (LIPITOR) 10 MG tablet Take 1 tablet (10 mg total) by mouth daily. 04/09/17   Marin Olp, MD  budesonide-formoterol [facility]) 160-4.5 MCG/ACT inhaler Inhale 2 puffs into the lungs 2 (two) times daily. 09/10/17   Juanito Doom, MD  calcium-vitamin D (OSCAL WITH D) 500-200 MG-UNIT per tablet Take 1 tablet by mouth daily.    [provider]  clindamycin (CLEOCIN) 300 MG capsule Take 1 capsule (300 mg total) by mouth 4 (four) times daily for 10 days. 03/01/18 03/11/18  Laban Emperor, PA-C   famotidine (PEPCID) 20 MG tablet Take 1 tablet (20 mg total) by mouth 2 (two) times daily. 01/24/18   Marin Olp, MD  glimepiride (AMARYL) 4 MG tablet Take 1 tablet (4 mg total) by mouth daily with breakfast. 12/09/17   Marin Olp, MD  glucose blood (ACCU-CHEK AVIVA PLUS) test strip Test blood glucose daily. 07/29/15   Marin Olp, MD  glucose blood test strip One Touch Ultra Test Strips 08/02/15   Marin Olp, MD  isosorbide mononitrate (IMDUR) 30 MG 24 hr tablet TAKE 3 TABLETS BY MOUTH ONCE DAILY 01/01/18   Marin Olp, MD  lisinopril (PRINIVIL,ZESTRIL) 5 MG tablet TAKE 1/2 (ONE-HALF) TABLET BY MOUTH ONCE DAILY 01/17/18   Marin Olp, MD  metFORMIN (GLUCOPHAGE) 500 MG tablet Take 0.5 tablets (250 mg total) by mouth daily with breakfast. 12/14/16   Marin Olp, MD  metoprolol succinate (TOPROL-XL) 50 MG 24 hr tablet TAKE 1 TABLET BY MOUTH ONCE DAILY (TAKE  WITH  OR  IMMEDIATELY  FOLLOWING  A  MEAL) 01/08/18   Marin Olp, MD  nitroGLYCERIN (NITROSTAT) 0.4 MG SL tablet Place 1 tablet (0.4 mg total) under the tongue every 5 (five) minutes as needed for chest pain. 04/09/17   Marin Olp, MD  Omega-3 Fatty Acids (FISH OIL PO) Take 2 capsules by mouth 2 (two) times daily.    [provider]  Polyethyl Glycol-Propyl Glycol (SYSTANE OP) Place 1 drop into both eyes 4 (four) times daily. Itchy/dry eye    [provider]  potassium citrate (UROCIT-K) 10 MEQ (1080 MG) SR tablet Take 10 mEq by mouth Twice daily. 01/29/12   [provider]  ranolazine (RANEXA) 500 MG 12 hr tablet Take 1 tablet (500 mg total) by mouth 2 (two) times daily. 05/28/17   Sueanne Margarita, MD  traMADol (ULTRAM) 50 MG tablet Take 1 tablet (50 mg total) by mouth every 6 (six) hours as needed for moderate pain (twice per day maximum). 12/09/17   Marin Olp, MD  zinc sulfate 220 MG capsule Take 220 mg by mouth daily.    [provider]     Allergies Codeine sulfate; Crestor [rosuvastatin calcium]; and Cephalexin  Family History  Problem Relation Age of Onset  . ALS Father   . Asthma Daughter   . Cancer Sister        breast/liver    Social History Social History   Tobacco Use  . Smoking status: Former Smoker    Packs/day: 1.50    Years: 8.00    Pack years: 12.00    Types: Cigarettes    Last attempt to quit: 03/05/1958    Years since quitting: 60.0  . Smokeless tobacco: Never Used  Substance Use Topics  . Alcohol use: No    Alcohol/week: 0.0 standard drinks  . Drug use: No  Review of Systems  Constitutional: No fever/chills Respiratory: No SOB. Gastrointestinal: No nausea, no vomiting.  Musculoskeletal: Positive for arm pain Skin: Negative for rash, ecchymosis.  Positive for laceration. Neurological: Negative for numbness or tingling   ____________________________________________   PHYSICAL EXAM:  VITAL SIGNS: ED Triage Vitals [03/01/18 0936]  Enc Vitals Group     BP      Pulse      Resp      Temp      Temp src      SpO2      Weight 184 lb 4.9 oz (83.6 kg)     Height      Head Circumference      Peak Flow      Pain Score 3     Pain Loc      Pain Edu?      Excl. in [place]?      Constitutional: Alert and oriented. Well appearing and in no acute distress. Eyes: Conjunctivae are normal. PERRL. EOMI. Head: Atraumatic. ENT:      Ears:      Nose: No congestion/rhinnorhea.      Mouth/Throat: Mucous membranes are moist.  Neck: No stridor.  Cardiovascular: Normal rate, regular rhythm.  Good peripheral circulation. Respiratory: Normal respiratory effort without tachypnea or retractions. Lungs CTAB. Good air entry to the bases with no decreased or absent breath sounds. Musculoskeletal: Full range of motion to all extremities. No gross deformities appreciated. Neurologic:  Normal speech and language. No gross focal neurologic deficits are appreciated.  Skin:  Skin is warm, dry.  2 inch by  1.5 inch skin avulsion with exposed adipose tissue. No visible muscle. Surrounding skin intact.   Psychiatric: Mood and affect are normal. Speech and behavior are normal. Patient exhibits appropriate insight and judgement.   ____________________________________________   LABS (all labs ordered are listed, but only abnormal results are displayed)  Labs Reviewed - No data to display ____________________________________________  EKG   ____________________________________________  RADIOLOGY   No results found.  ____________________________________________    PROCEDURES  Procedure(s) performed:    Procedures    Medications  lidocaine-EPINEPHrine-tetracaine (LET) solution (has no administration in time range)  silver nitrate applicators applicator 1 Stick (has no administration in time range)  Ampicillin-Sulbactam (UNASYN) 3 g in sodium chloride 0.9 % 100 mL IVPB (0 g Intravenous Stopped 03/01/18 1315)  clindamycin (CLEOCIN) injection 300 mg (300 mg Intramuscular Given 03/01/18 1315)     ____________________________________________   INITIAL IMPRESSION / ASSESSMENT AND PLAN / ED COURSE  Pertinent labs & imaging results that were available during my care of the patient were reviewed by me and considered in my medical decision making (see chart for details).  Review of the [place] CSRS was performed in accordance of the [place] prior to dispensing any controlled drugs.   Patient's diagnosis is consistent with wound from donkey injury.  Wound is unable to be repaired.  Bleeding was controlled with LET, silver nitrate, Surgicel.  Pressure dressing was applied.  Patient did not have any bleeding through pressure dressing during 1.5 hours of waiting.  IV Unasyn was originally given to cover for donkey intraoral flora.  Patient then confirms that donkey teeth never went through the coat sleeve and never broke the fabric on his coat. Therefore donkey teeth did not come in contact with  his skin. No concern for rabies since skin did not come intact with donkey. Patient was also been given a shot of IM clindamycin to prevent infection.  Patient  is agreeable to follow-up with primary care, wound care, this department on Monday for wound recheck.  Patient will be discharged home with prescriptions for clindamycin. Patient is to follow up with PCP as directed. Patient is given ED precautions to return to the ED for any worsening or new symptoms.     ____________________________________________  FINAL CLINICAL IMPRESSION(S) / ED DIAGNOSES  Final diagnoses:  Animal bite  Laceration of right upper extremity, initial encounter      NEW MEDICATIONS STARTED DURING THIS VISIT:  ED Discharge Orders         Ordered    clindamycin (CLEOCIN) 300 MG capsule  4 times daily     03/01/18 1239              This chart was dictated using voice recognition software/Dragon. Despite best efforts to proofread, errors can occur which can change the meaning. Any change was purely unintentional.    Laban Emperor, PA-C 03/01/18 1555    Carrie Mew, MD 03/10/18 [phone]

## 2018-03-01 NOTE — ED Triage Notes (Signed)
Bit on right forearm by a donkey this morning.

## 2018-03-03 ENCOUNTER — Ambulatory Visit (INDEPENDENT_AMBULATORY_CARE_PROVIDER_SITE_OTHER): Payer: PPO | Admitting: Family Medicine

## 2018-03-03 ENCOUNTER — Encounter: Payer: Self-pay | Admitting: Family Medicine

## 2018-03-03 VITALS — BP 132/74 | HR 68 | Temp 97.5°F | Ht 69.5 in | Wt 188.4 lb

## 2018-03-03 DIAGNOSIS — T148XXA Other injury of unspecified body region, initial encounter: Secondary | ICD-10-CM | POA: Diagnosis not present

## 2018-03-03 DIAGNOSIS — S51811D Laceration without foreign body of right forearm, subsequent encounter: Secondary | ICD-10-CM

## 2018-03-03 DIAGNOSIS — S51811A Laceration without foreign body of right forearm, initial encounter: Secondary | ICD-10-CM | POA: Diagnosis not present

## 2018-03-03 NOTE — Progress Notes (Signed)
   Subjective:  Roy [name] is a 84 y.o. male who presents today for same-day appointment with a chief complaint of skin tear.   HPI:  Skin Tear, Acute problem Patient suffered skin tear to his right arm 2 days ago.  Stated he went out to feed his animals when his pet donkey bit him on his arm.  Bite did not penetrate his clothing.  Did not think much of until noticed significant bleeding to the area.  He went to the emergency department and was treated with IV Unasyn and IM clindamycin.  Bleeding was controlled with Surgicel and silver nitrate.  Over the last 2 days, his skin tear is been stable.  Has not had any bleeding to the area.  He has been taking oral clindamycin as directed by the emergency department.  He has not had any increasing pain to the area.  No drainage.  No redness.  No other obvious alleviating or aggravating factors.   ROS: Per HPI  PMH: He reports that he quit smoking about 60 years ago. His smoking use included cigarettes. He has a 12.00 pack-year smoking history. He has never used smokeless tobacco. He reports that he does not drink alcohol or use drugs.  Objective:  Physical Exam: BP 132/74 (BP Location: Left Arm, Patient Position: Sitting, Cuff Size: Normal)   Pulse 68   Temp (!) 97.5 F (36.4 C) (Oral)   Ht 5' 9.5" (1.765 m)   Wt 188 lb 6.1 oz (85.4 kg)   SpO2 97%   BMI 27.42 kg/m   Gen: NAD, resting comfortably Skin: Approximately 5 x 5 cm skin tear with exposed subcutaneous tissue on right dorsal forearm.  No drainage.  No purulence.  No surrounding erythema.  The area noted above was debrided of necrotic tissue and bandaging material in the office today to the subcutaneous level.  Patient tolerated well with minimal blood loss.  Hemostasis was achieved via pressure.  The area was then re-bandaged with Steri-Strips, bacitracin ointment, gauze, and a pressure bandage.  Assessment/Plan:  Animal Bite / Skin Tear Appears to be healing normally.  Area was  re-bandaged today as noted above.  He will be following up with wound care clinic later this week.  He will continue his clindamycin 4 times daily for a full 10-day course.  He does not need tetanus vaccine at this time.  Discussed reasons to return to care and seek emergent care.  Time Spent: I spent >25 minutes face-to-face with the patient, with more than half spent on coordinating care and counseling for treatment plan for his wound.   Algis Greenhouse. Jerline Pain, MD 03/03/2018 1:50 PM

## 2018-03-06 ENCOUNTER — Encounter: Payer: PPO | Attending: Physician Assistant | Admitting: Physician Assistant

## 2018-03-06 DIAGNOSIS — E1122 Type 2 diabetes mellitus with diabetic chronic kidney disease: Secondary | ICD-10-CM | POA: Diagnosis not present

## 2018-03-06 DIAGNOSIS — S51811A Laceration without foreign body of right forearm, initial encounter: Secondary | ICD-10-CM | POA: Diagnosis not present

## 2018-03-06 DIAGNOSIS — Z992 Dependence on renal dialysis: Secondary | ICD-10-CM | POA: Diagnosis not present

## 2018-03-06 DIAGNOSIS — Z87891 Personal history of nicotine dependence: Secondary | ICD-10-CM | POA: Diagnosis not present

## 2018-03-06 DIAGNOSIS — N183 Chronic kidney disease, stage 3 (moderate): Secondary | ICD-10-CM | POA: Insufficient documentation

## 2018-03-06 DIAGNOSIS — W5531XA Bitten by other hoof stock, initial encounter: Secondary | ICD-10-CM | POA: Diagnosis not present

## 2018-03-06 DIAGNOSIS — G4730 Sleep apnea, unspecified: Secondary | ICD-10-CM | POA: Diagnosis not present

## 2018-03-06 DIAGNOSIS — I129 Hypertensive chronic kidney disease with stage 1 through stage 4 chronic kidney disease, or unspecified chronic kidney disease: Secondary | ICD-10-CM | POA: Diagnosis not present

## 2018-03-06 DIAGNOSIS — S51801A Unspecified open wound of right forearm, initial encounter: Secondary | ICD-10-CM | POA: Diagnosis not present

## 2018-03-06 DIAGNOSIS — I2510 Atherosclerotic heart disease of native coronary artery without angina pectoris: Secondary | ICD-10-CM | POA: Insufficient documentation

## 2018-03-06 DIAGNOSIS — S51851A Open bite of right forearm, initial encounter: Secondary | ICD-10-CM | POA: Insufficient documentation

## 2018-03-06 NOTE — Progress Notes (Signed)
[name], Roy MANGRUM ([id number]) Visit Report for 03/06/2018 Abuse/Suicide Risk Screen Details Patient Name: Roy [name], [name] Date of Service: 03/06/2018 8:00 AM Medical Record Number: [id number] Patient Account Number: [id number] Date of Birth/Sex: Jul 21, 1932 (85 y.o. M) Treating RN: Harold Barban Primary Care Ibraham Levi: [name] Other Clinician: Referring Savilla Turbyfill: STAFFORD, PHILLIP Treating Siraj Dermody/Extender: STONE III, HOYT Weeks in Treatment: 0 Abuse/Suicide Risk Screen Items Answer ABUSE/SUICIDE RISK SCREEN: Has anyone close to you tried to hurt or harm you recentlyo No Do you feel uncomfortable with anyone in your familyo No Has anyone forced you do things that you didnot want to doo No Patient displays signs or symptoms of abuse and/or neglect. No Electronic Signature(s) Signed: 03/06/2018 3:53:38 PM By: Harold Barban Entered By: Harold Barban on 03/06/2018 08:28:08 Moeller, McLouthMarland Kitchen ([id number]) -------------------------------------------------------------------------------- Activities of Daily Living Details Patient Name: [name], [name] Date of Service: 03/06/2018 8:00 AM Medical Record Number: [id number] Patient Account Number: [id number] Date of Birth/Sex: Oct 06, 1932 (85 y.o. M) Treating RN: Harold Barban Primary Care Neidra Girvan: [name] Other Clinician: Referring Karrisa Didio: Joni Fears, PHILLIP Treating Nealy Hickmon/Extender: STONE III, HOYT Weeks in Treatment: 0 Activities of Daily Living Items Answer Activities of Daily Living (Please select one for each item) Drive Automobile Completely Able Take Medications Completely Able Use Telephone Completely Able Care for Appearance Completely Able Use Toilet Completely Able Bath / Shower Completely Able Dress Self Completely Able Feed Self Completely Able Walk Completely Able Get In / Out Bed Completely Able Housework Completely Able Prepare Meals Completely [place] Completely Able Shop for Self Completely  Able Electronic Signature(s) Signed: 03/06/2018 3:53:38 PM By: Harold Barban Entered By: Harold Barban on 03/06/2018 08:28:24 Folz, Oather Alfonso Patten ([id number]) -------------------------------------------------------------------------------- Education Assessment Details Patient Name: [name], [name] Date of Service: 03/06/2018 8:00 AM Medical Record Number: [id number] Patient Account Number: [id number] Date of Birth/Sex: [birth year] (85 y.o. M) Treating RN: Harold Barban Primary Care Saagar Tortorella: [name] Other Clinician: Referring Marjorie Lussier: Carrie Mew Treating Adalin Vanderploeg/Extender: Melburn Hake, HOYT Weeks in Treatment: 0 Learning Preferences/Education Level/Primary Language Learning Preference: Explanation Highest Education Level: College or Above Preferred Language: English Cognitive Barrier Assessment/Beliefs Language Barrier: No Translator Needed: No Memory Deficit: No Emotional Barrier: No Cultural/Religious Beliefs Affecting Medical Care: No Physical Barrier Assessment Impaired Vision: No Impaired Hearing: No Decreased Hand dexterity: No Knowledge/Comprehension Assessment Knowledge Level: High Comprehension Level: High Ability to understand written High instructions: Ability to understand verbal High instructions: Motivation Assessment Anxiety Level: Calm Cooperation: Cooperative Education Importance: Acknowledges Need Interest in Health Problems: Asks Questions Perception: Coherent Willingness to Engage in Self- High Management Activities: Readiness to Engage in Self- High Management Activities: Electronic Signature(s) Signed: 03/06/2018 3:53:38 PM By: Harold Barban Entered By: Harold Barban on 03/06/2018 08:28:49 Reaser, Kindrick Alfonso Patten ([id number]) -------------------------------------------------------------------------------- Fall Risk Assessment Details Patient Name: [name], [name] Date of Service: 03/06/2018 8:00 AM Medical Record Number:  [id number] Patient Account Number: [id number] Date of Birth/Sex: 1932/05/23 (85 y.o. M) Treating RN: Harold Barban Primary Care Almer Bushey: [name] Other Clinician: Referring Matei Magnone: Joni Fears, PHILLIP Treating Fatisha Rabalais/Extender: Melburn Hake, HOYT Weeks in Treatment: 0 Fall Risk Assessment Items Have you had 2 or more falls in the last 12 monthso 0 No Have you had any fall that resulted in injury in the last 12 monthso 0 No FALL RISK ASSESSMENT: History of falling - immediate or within 3 months 0 No Secondary diagnosis 0 No Ambulatory aid None/bed rest/wheelchair/nurse 0 No Crutches/cane/walker 0 No Furniture 0 No IV Access/Saline Lock 0 No Gait/Training Normal/bed rest/immobile 0 No Weak 0  No Impaired 0 No Mental Status Oriented to own ability 0 Yes Electronic Signature(s) Signed: 03/06/2018 3:53:38 PM By: Harold Barban Entered By: Harold Barban on 03/06/2018 08:28:57 Leedy, Soren R. ([id number]) -------------------------------------------------------------------------------- Foot Assessment Details Patient Name: [name], [name] Date of Service: 03/06/2018 8:00 AM Medical Record Number: [id number] Patient Account Number: [id number] Date of Birth/Sex: 03-Jun-1932 (85 y.o. M) Treating RN: Harold Barban Primary Care Gustabo Gordillo: [name] Other Clinician: Referring Nakia Koble: STAFFORD, PHILLIP Treating Elyzabeth Goatley/Extender: STONE III, HOYT Weeks in Treatment: 0 Foot Assessment Items Site Locations + = Sensation present, - = Sensation absent, C = Callus, U = Ulcer R = Redness, W = Warmth, M = Maceration, PU = Pre-ulcerative lesion F = Fissure, S = Swelling, D = Dryness Assessment Right: Left: Other Deformity: No No Prior Foot Ulcer: No No Prior Amputation: No No Charcot Joint: No No Ambulatory Status: Gait: Electronic Signature(s) Signed: 03/06/2018 3:53:38 PM By: Harold Barban Entered By: Harold Barban on 03/06/2018 08:29:14 Merle, Iain R.  ([id number]) -------------------------------------------------------------------------------- Nutrition Risk Assessment Details Patient Name: [name], [name] Date of Service: 03/06/2018 8:00 AM Medical Record Number: [id number] Patient Account Number: [id number] Date of Birth/Sex: 1932-04-07 (85 y.o. M) Treating RN: Harold Barban Primary Care Isra Lindy: [name] Other Clinician: Referring Keiton Cosma: STAFFORD, PHILLIP Treating Ravinder Lukehart/Extender: STONE III, HOYT Weeks in Treatment: 0 Height (in): 69 Weight (lbs): 181 Body Mass Index (BMI): 26.7 Nutrition Risk Assessment Items NUTRITION RISK SCREEN: I have an illness or condition that made me change the kind and/or amount of 0 No food I eat I eat fewer than two meals per day 0 No I eat few fruits and vegetables, or milk products 0 No I have three or more drinks of beer, liquor or wine almost every day 0 No I have tooth or mouth problems that make it hard for me to eat 0 No I don't always have enough money to buy the food I need 0 No I eat alone most of the time 0 No I take three or more different prescribed or over-the-counter drugs a day 1 Yes Without wanting to, I have lost or gained 10 pounds in the last six months 0 No I am not always physically able to shop, cook and/or feed myself 0 No Nutrition Protocols Good Risk Protocol Moderate Risk Protocol Electronic Signature(s) Signed: 03/06/2018 3:53:38 PM By: Harold Barban Entered By: Harold Barban on 03/06/2018 08:29:02

## 2018-03-07 NOTE — Progress Notes (Signed)
Goines, JAQUAE RIEVES ([id number]) Visit Report for 03/06/2018 Chief Complaint Document Details Patient Name: Riga, Randeep R. Date of Service: 03/06/2018 8:00 AM Medical Record Number: [id number] Patient Account Number: [id number] Date of Birth/Sex: Jan 18, 1933 (85 y.o. M) Treating RN: Montey Hora Primary Care Provider: [name] Other Clinician: Referring Provider: Carrie Mew Treating Provider/Extender: Melburn Hake, HOYT Weeks in Treatment: 0 Information Obtained from: Patient Chief Complaint Donkey bite/Trauma right forearm Electronic Signature(s) Signed: 03/06/2018 11:59:27 PM By: Worthy Keeler PA-C Entered By: Worthy Keeler on 03/06/2018 08:44:12 Blower, Sora R. ([id number]) -------------------------------------------------------------------------------- Debridement Details Patient Name: Mcentee, Eryk R. Date of Service: 03/06/2018 8:00 AM Medical Record Number: [id number] Patient Account Number: [id number] Date of Birth/Sex: 1932-09-28 (85 y.o. M) Treating RN: Montey Hora Primary Care Provider: [name] Other Clinician: Referring Provider: STAFFORD, PHILLIP Treating Provider/Extender: STONE III, HOYT Weeks in Treatment: 0 Debridement Performed for Wound #1 Right Forearm Assessment: Performed By: Physician STONE III, HOYT E., PA-C Debridement Type: Debridement Level of Consciousness (Pre- Awake and Alert procedure): Pre-procedure Verification/Time Yes - 08:46 Out Taken: Start Time: 08:46 Pain Control: Lidocaine Total Area Debrided (L x W): 5 (cm) x 3.5 (cm) = 17.5 (cm) Tissue and other material Viable, Non-Viable, Slough, Subcutaneous, Skin: Dermis , Skin: Epidermis, Slough debrided: Level: Skin/Subcutaneous Tissue Debridement Description: Excisional Instrument: Curette, Forceps, Scissors Bleeding: Large Hemostasis Achieved: Silver Nitrate End Time: 08:51 Procedural Pain: 0 Post Procedural Pain: 0 Response to Treatment: Procedure was tolerated  well Level of Consciousness Awake and Alert (Post-procedure): Post Debridement Measurements of Total Wound Length: (cm) 5 Width: (cm) 3.5 Depth: (cm) 0.3 Volume: (cm) 4.123 Character of Wound/Ulcer Post Debridement: Improved Post Procedure Diagnosis Same as Pre-procedure Electronic Signature(s) Signed: 03/06/2018 4:35:36 PM By: Montey Hora Signed: 03/06/2018 11:59:27 PM By: Worthy Keeler PA-C Entered By: Montey Hora on 03/06/2018 09:51:32 Ballon, Ji R. ([id number]) -------------------------------------------------------------------------------- HPI Details Patient Name: Lamorte, Ramiel R. Date of Service: 03/06/2018 8:00 AM Medical Record Number: [id number] Patient Account Number: [id number] Date of Birth/Sex: 1932-12-02 (85 y.o. M) Treating RN: Montey Hora Primary Care Provider: [name] Other Clinician: Referring Provider: Joni Fears, PHILLIP Treating Provider/Extender: Melburn Hake, HOYT Weeks in Treatment: 0 History of Present Illness HPI Description: 03/06/18 on evaluation today patient appears to be doing okay upon initial evaluation here in our clinic. He does have a skin tear which occurred on the right form which was a result of having been bitten by his daughter's donkey. He states that the animal is normally very mild tempered and in fact was not even mad when this occurred he had an Duman and was attempting to feed the donkey the donkey missed and biting for the Aburto and got his arm through his coat. It did not pierce the coat but nonetheless just the pressure of the bite itself because the skin breakdown. Nonetheless he did have a significant skin tear which required that he go to the hospital at the emergency department on Saturday, March 01, 2018. Subsequently he was given clindamycin 300 mg four times a day. He states that his the does have diabetes and is A1c is around 5.1 at the last check. No fevers chills noted he does tell me that they had a difficult  time getting his breathing under control at the emergency department when he was there. Fortunately this does not seem to be waiting separately this point although it is going to require some debridement bleeding may become an issue. Otherwise the patient has no other major medical problems and aspirin is  the only blood thinner that he is on at this point. Electronic Signature(s) Signed: 03/06/2018 11:59:27 PM By: Worthy Keeler PA-C Entered By: Worthy Keeler on 03/06/2018 17:13:47 Waide, Pincus Sanes ([id number]) -------------------------------------------------------------------------------- Physical Exam Details Patient Name: Berberian, Jorryn R. Date of Service: 03/06/2018 8:00 AM Medical Record Number: [id number] Patient Account Number: [id number] Date of Birth/Sex: 1933/01/04 (85 y.o. M) Treating RN: Montey Hora Primary Care Provider: [name] Other Clinician: Referring Provider: Joni Fears, PHILLIP Treating Provider/Extender: STONE III, HOYT Weeks in Treatment: 0 Constitutional patient is hypertensive.. pulse regular and within target range for patient.[name] respirations regular, non-labored and within target range for patient.[name] temperature within target range for patient.. Well-nourished and well-hydrated in no acute distress. Eyes conjunctiva clear no eyelid edema noted. pupils equal round and reactive to light and accommodation. Ears, Nose, Mouth, and Throat no gross abnormality of ear auricles or external auditory canals. normal hearing noted during conversation. mucus membranes moist. Respiratory normal breathing without difficulty. clear to auscultation bilaterally. Cardiovascular regular rate and rhythm with normal S1, S2. no clubbing, cyanosis, significant edema, <3 sec cap refill. Gastrointestinal (GI) soft, non-tender, non-distended, +BS. no ventral hernia noted. Musculoskeletal normal gait and posture. no significant deformity or arthritic changes, no loss or range of  motion, no clubbing. Psychiatric this patient is able to make decisions and demonstrates good insight into disease process. Alert and Oriented x 3. pleasant and cooperative. Notes Patient's wound bed currently shows evidence of good granulation at this point which is excellent news. There is some necrotic tissue/Slough noted on the surface of the wound which did require sharp debridement and locations. Patient tolerated this without complication and he did have some bleeding though hemostasis was achieved initially with pressure using an alginate dressing here in the office. Things seem to be going well at this point. We then applied his dressing utilizing Coban supply some additional pressure to the area. Electronic Signature(s) Signed: 03/06/2018 11:59:27 PM By: Worthy Keeler PA-C Entered By: Worthy Keeler on 03/06/2018 17:14:37 Silberstein, Pincus Sanes ([id number]) -------------------------------------------------------------------------------- Physician Orders Details Patient Name: Verry, Peggy R. Date of Service: 03/06/2018 8:00 AM Medical Record Number: [id number] Patient Account Number: [id number] Date of Birth/Sex: 09-28-1932 (85 y.o. M) Treating RN: Harold Barban Primary Care Provider: [name] Other Clinician: Referring Provider: STAFFORD, PHILLIP Treating Provider/Extender: Melburn Hake, HOYT Weeks in Treatment: 0 Verbal / Phone Orders: No Diagnosis Coding ICD-10 Coding Code Description W55.81XA Bitten by other mammals, initial encounter S51.801A Unspecified open wound of right forearm, initial encounter E11.622 Type 2 diabetes mellitus with other skin ulcer Wound Cleansing Wound #1 Right Forearm o Cleanse wound with mild soap and water - Clean wound with Dial antibacterial soap (pump), gently pat to dry. o May Shower, gently pat wound dry prior to applying new dressing. Primary Wound Dressing Wound #1 Right Forearm o Silver Alginate - over xeroform o Xeroform - on  wound bed to prevent sticking Secondary Dressing Wound #1 Right Forearm o Dry Gauze o Conform/Kerlix Dressing Change Frequency Wound #1 Right Forearm o Change dressing every other day. - May need to change tomorrow, March 07, 2018 Follow-up Appointments Wound #1 Right Forearm o Return Appointment in 1 week. Notes Complete all antibiotics Electronic Signature(s) Signed: 03/06/2018 4:35:36 PM By: Montey Hora Signed: 03/06/2018 11:59:27 PM By: Worthy Keeler PA-C Entered By: Montey Hora on 03/06/2018 10:00:01 Overbay, Pincus Sanes ([id number]) -------------------------------------------------------------------------------- Problem List Details Patient Name: Zukas, Jos R. Date of Service: 03/06/2018 8:00 AM Medical Record Number: [id number] Patient Account Number:  [id number] Date of Birth/Sex: 03-23-1932 (85 y.o. M) Treating RN: Montey Hora Primary Care Provider: [name] Other Clinician: Referring Provider: Joni Fears, PHILLIP Treating Provider/Extender: Melburn Hake, HOYT Weeks in Treatment: 0 Active Problems ICD-10 Evaluated Encounter Code Description Active Date Today Diagnosis W55.81XA Bitten by other mammals, initial encounter 03/06/2018 No Yes S51.801A Unspecified open wound of right forearm, initial encounter 03/06/2018 No Yes E11.622 Type 2 diabetes mellitus with other skin ulcer 03/06/2018 No Yes Inactive Problems Resolved Problems Electronic Signature(s) Signed: 03/06/2018 11:59:27 PM By: Worthy Keeler PA-C Entered By: Worthy Keeler on 03/06/2018 08:43:35 Knab, Johnn R. ([id number]) -------------------------------------------------------------------------------- Progress Note Details Patient Name: Gilcrest, Zakee R. Date of Service: 03/06/2018 8:00 AM Medical Record Number: [id number] Patient Account Number: [id number] Date of Birth/Sex: [birth year] (85 y.o. M) Treating RN: Montey Hora Primary Care Provider: [name] Other Clinician: Referring Provider:  Carrie Mew Treating Provider/Extender: Melburn Hake, HOYT Weeks in Treatment: 0 Subjective Chief Complaint Information obtained from Patient Pollie Friar bite/Trauma right forearm History of Present Illness (HPI) 03/06/18 on evaluation today patient appears to be doing okay upon initial evaluation here in our clinic. He does have a skin tear which occurred on the right form which was a result of having been bitten by his daughter's donkey. He states that the animal is normally very mild tempered and in fact was not even mad when this occurred he had an [place] and was attempting to feed the donkey the donkey missed and biting for the [place] and got his arm through his coat. It did not pierce the coat but nonetheless just the pressure of the bite itself because the skin breakdown. Nonetheless he did have a significant skin tear which required that he go to the hospital at the emergency department on Saturday, March 01, 2018. Subsequently he was given clindamycin 300 mg four times a day. He states that his the does have diabetes and is A1c is around 5.1 at the last check. No fevers chills noted he does tell me that they had a difficult time getting his breathing under control at the emergency department when he was there. Fortunately this does not seem to be waiting separately this point although it is going to require some debridement bleeding may become an issue. Otherwise the patient has no other major medical problems and aspirin is the only blood thinner that he is on at this point. Wound History Patient presents with 1 open wound that has been present for approximately March 01, 2018. Patient has been treating wound in the following manner: coban, soap and water. Laboratory tests have not been performed in the last month. Patient reportedly has not tested positive for an antibiotic resistant organism. Patient reportedly has not tested positive for osteomyelitis. Patient reportedly has not  had testing performed to evaluate circulation in the legs. Patient experiences the following problems associated with their wounds: swelling. Patient History Information obtained from Patient. Allergies codeine (Severity: Moderate, Reaction: chest pain), Crestor (Severity: Moderate, Reaction: joint pain, rash), cephalexin (Severity: Mild) Family History Cancer - Siblings, Diabetes - Siblings, Heart Disease - Siblings, Kidney Disease - Siblings, No family history of Hereditary Spherocytosis, Hypertension, Lung Disease, Seizures, Stroke, Thyroid Problems, Tuberculosis. Social History Former smoker - ended on 03/05/1958, Marital Status - Married, Alcohol Use - Never, Drug Use - No History, Caffeine Use - Daily. Medical History Respiratory Patient has history of Sleep Apnea Cardiovascular Hence, Troy R. ([id number]) Patient has history of Coronary Artery Disease, Hypertension Endocrine Patient has history of Type II Diabetes  Medical And Surgical History Notes Hematologic/Lymphatic Thrombocytopenia Review of Systems (ROS) Eyes The patient has no complaints or symptoms. Ear/Nose/Mouth/Throat Complains or has symptoms of Sinusitis. Hematologic/Lymphatic The patient has no complaints or symptoms. Respiratory The patient has no complaints or symptoms. Cardiovascular The patient has no complaints or symptoms. Gastrointestinal GERD Genitourinary Complains or has symptoms of Kidney failure/ Dialysis - Stage III 02/22/2009, kidney stones Immunological The patient has no complaints or symptoms. Integumentary (Skin) Complains or has symptoms of Wounds - donkey bite. Neurologic The patient has no complaints or symptoms. Oncologic The patient has no complaints or symptoms. Psychiatric The patient has no complaints or symptoms. Objective Constitutional patient is hypertensive.. pulse regular and within target range for patient.[name] respirations regular, non-labored and within  target range for patient.[name] temperature within target range for patient.. Well-nourished and well-hydrated in no acute distress. Vitals Time Taken: 8:11 AM, Height: 69 in, Source: Stated, Weight: 181 lbs, Source: Stated, BMI: 26.7, Temperature: 97.8 F, Pulse: 55 bpm, Respiratory Rate: 16 breaths/min, Blood Pressure: 153/59 mmHg. Eyes conjunctiva clear no eyelid edema noted. pupils equal round and reactive to light and accommodation. Ears, Nose, Mouth, and Throat no gross abnormality of ear auricles or external auditory canals. normal hearing noted during conversation. mucus membranes moist. Savarino, Nachum R. ([id number]) Respiratory normal breathing without difficulty. clear to auscultation bilaterally. Cardiovascular regular rate and rhythm with normal S1, S2. no clubbing, cyanosis, significant edema, Gastrointestinal (GI) soft, non-tender, non-distended, +BS. no ventral hernia noted. Musculoskeletal normal gait and posture. no significant deformity or arthritic changes, no loss or range of motion, no clubbing. Psychiatric this patient is able to make decisions and demonstrates good insight into disease process. Alert and Oriented x 3. pleasant and cooperative. General Notes: Patient's wound bed currently shows evidence of good granulation at this point which is excellent news. There is some necrotic tissue/Slough noted on the surface of the wound which did require sharp debridement and locations. Patient tolerated this without complication and he did have some bleeding though hemostasis was achieved initially with pressure using an alginate dressing here in the office. Things seem to be going well at this point. We then applied his dressing utilizing Coban supply some additional pressure to the area. Integumentary (Hair, Skin) Wound #1 status is Open. Original cause of wound was Trauma. The wound is located on the Right Forearm. The wound measures 5cm length x 3.5cm width x 0.1cm depth;  13.744cm^2 area and 1.374cm^3 volume. There is Fat Layer (Subcutaneous Tissue) Exposed exposed. There is no tunneling or undermining noted. There is a large amount of serosanguineous drainage noted. The wound margin is flat and intact. There is no granulation within the wound bed. There is a large (67-100%) amount of necrotic tissue within the wound bed including Adherent Slough. The periwound skin appearance exhibited: Excoriation, Hemosiderin Staining. The periwound skin appearance did not exhibit: Callus, Crepitus, Induration, Rash, Scarring, Dry/Scaly, Maceration, Atrophie Blanche, Cyanosis, Ecchymosis, Mottled, Pallor, Rubor, Erythema. Assessment Active Problems ICD-10 Bitten by other mammals, initial encounter Unspecified open wound of right forearm, initial encounter Type 2 diabetes mellitus with other skin ulcer Procedures Wound #1 Pre-procedure diagnosis of Wound #1 is a Trauma, Other located on the Right Forearm . There was a Excisional Skin/Subcutaneous Tissue Debridement with a total area of 17.5 sq cm performed by STONE III, HOYT E., PA-C. With the following instrument(s): Curette, Forceps, and Scissors to remove Viable and Non-Viable tissue/material. Material removed includes Subcutaneous Tissue, Slough, Skin: Dermis, and Skin: Epidermis after achieving pain control  using Lidocaine. No specimens were taken. A time out was conducted at 08:46, prior to the start of the procedure. A Large amount of bleeding was controlled with Silver Nitrate. The procedure was tolerated well with a pain level of 0 throughout and a pain level of 0 Revere, Carman R. ([id number]) following the procedure. Post Debridement Measurements: 5cm length x 3.5cm width x 0.3cm depth; 4.123cm^3 volume. Character of Wound/Ulcer Post Debridement is improved. Post procedure Diagnosis Wound #1: Same as Pre-Procedure Plan Wound Cleansing: Wound #1 Right Forearm: Cleanse wound with mild soap and water - Clean wound  with Dial antibacterial soap (pump), gently pat to dry. May Shower, gently pat wound dry prior to applying new dressing. Primary Wound Dressing: Wound #1 Right Forearm: Silver Alginate - over xeroform Xeroform - on wound bed to prevent sticking Secondary Dressing: Wound #1 Right Forearm: Dry Gauze Conform/Kerlix Dressing Change Frequency: Wound #1 Right Forearm: Change dressing every other day. - May need to change tomorrow, March 07, 2018 Follow-up Appointments: Wound #1 Right Forearm: Return Appointment in 1 week. General Notes: Complete all antibiotics Patient was discharged with the above wound care measures. Unfortunately following discharge she returned about 10 minutes later due to the fact that he actually had bleeding through his dressing into his coat unfortunately. Upon taking down his dressing I did find that there was a small blood vessel which was bleeding in the 12 o'clock location of the wound bed. Subsequently this word did require chemical cauterization to achieve hemostasis. Patient tolerated this without any significant discomfort which was good news. Once everything was stopped as far as the bleeding was concerned I then put a dressing on it and watch this to ensure that nothing was going to begin bleeding again everything seemed to be doing well. The patient tolerated all of this without complication. Fortunately I do believe we have achieved good hemostasis and the wound bed appears to be much cleaner than when he arrived. We will see how things appear next week. Please see above for specific wound care orders. We will see patient for re-evaluation in 1 week(s) here in the clinic. If anything worsens or changes patient will contact our office for additional recommendations. Electronic Signature(s) Signed: 03/06/2018 11:59:27 PM By: Worthy Keeler PA-C Entered By: Worthy Keeler on 03/06/2018 17:15:46 Mcnamara, Pincus Sanes  ([id number]) -------------------------------------------------------------------------------- ROS/PFSH Details Patient Name: Ziska, Icholas R. Date of Service: 03/06/2018 8:00 AM Medical Record Number: [id number] Patient Account Number: [id number] Date of Birth/Sex: 24-Dec-1932 (85 y.o. M) Treating RN: Harold Barban Primary Care Provider: [name] Other Clinician: Referring Provider: Joni Fears, PHILLIP Treating Provider/Extender: Melburn Hake, HOYT Weeks in Treatment: 0 Information Obtained From Patient Wound History Do you currently have one or more open woundso Yes How many open wounds do you currently haveo 1 Approximately how long have you had your woundso March 01, 2018 How have you been treating your wound(s) until nowo coban, soap and water Has your wound(s) ever healed and then re-openedo No Have you had any lab work done in the past montho No Have you tested positive for an antibiotic resistant organism (MRSA, VRE)o No Have you tested positive for osteomyelitis (bone infection)o No Have you had any tests for circulation on your legso No Have you had other problems associated with your woundso Swelling Ear/Nose/Mouth/Throat Complaints and Symptoms: Positive for: Sinusitis Genitourinary Complaints and Symptoms: Positive for: Kidney failure/ Dialysis - Stage III 02/22/2009 Review of System Notes: kidney stones Integumentary (Skin) Complaints and Symptoms: Positive for:  Wounds - donkey bite Eyes Complaints and Symptoms: No Complaints or Symptoms Hematologic/Lymphatic Complaints and Symptoms: No Complaints or Symptoms Medical History: Past Medical History Notes: Thrombocytopenia Respiratory Complaints and Symptoms: No Complaints or Symptoms Dubray, Nile R. ([id number]) Medical History: Positive for: Sleep Apnea Cardiovascular Complaints and Symptoms: No Complaints or Symptoms Medical History: Positive for: Coronary Artery Disease;  Hypertension Gastrointestinal Complaints and Symptoms: Review of System Notes: GERD Endocrine Medical History: Positive for: Type II Diabetes Time with diabetes: 8 years Immunological Complaints and Symptoms: No Complaints or Symptoms Neurologic Complaints and Symptoms: No Complaints or Symptoms Oncologic Complaints and Symptoms: No Complaints or Symptoms Psychiatric Complaints and Symptoms: No Complaints or Symptoms Immunizations Pneumococcal Vaccine: Received Pneumococcal Vaccination: Yes Implantable Devices Family and Social History Cancer: Yes - Siblings; Diabetes: Yes - Siblings; Heart Disease: Yes - Siblings; Hereditary Spherocytosis: No; Hypertension: No; Kidney Disease: Yes - Siblings; Lung Disease: No; Seizures: No; Stroke: No; Thyroid Problems: No; Tuberculosis: No; Former smoker - ended on 03/05/1958; Marital Status - Married; Alcohol Use: Never; Drug Use: No History; Caffeine Use: Daily; Financial Concerns: No; Food, Clothing or Shelter Needs: No; Support System Lacking: No; Transportation Concerns: No; Advanced Directives: No; Living Will: No; Medical Power of Attorney: No Ogawa, Tyan R. ([id number]) Electronic Signature(s) Signed: 03/06/2018 3:53:38 PM By: Harold Barban Signed: 03/06/2018 11:59:27 PM By: Worthy Keeler PA-C Entered By: Harold Barban on 03/06/2018 08:28:00 Diggins, Korry R. ([id number]) -------------------------------------------------------------------------------- SuperBill Details Patient Name: Luthi, Shant R. Date of Service: 03/06/2018 Medical Record Number: [id number] Patient Account Number: [id number] Date of Birth/Sex: 02/07/1933 (85 y.o. M) Treating RN: Montey Hora Primary Care Provider: [name] Other Clinician: Referring Provider: STAFFORD, PHILLIP Treating Provider/Extender: Melburn Hake, HOYT Weeks in Treatment: 0 Diagnosis Coding ICD-10 Codes Code Description W55.81XA Bitten by other mammals, initial encounter S51.801A  Unspecified open wound of right forearm, initial encounter E11.622 Type 2 diabetes mellitus with other skin ulcer Facility Procedures CPT4 Code: 16579038 Description: [place] VISIT-LEV 3 EST PT Modifier: Quantity: 1 CPT4 Code: [id number] Description: 91660 - DEB SUBQ TISSUE 20 SQ CM/< ICD-10 Diagnosis Description S51.801A Unspecified open wound of right forearm, initial encounter Modifier: Quantity: 1 Physician Procedures CPT4 Code: 6004599 Description: WC PHYS LEVEL 3 o NEW PT ICD-10 Diagnosis Description W55.81XA Bitten by other mammals, initial encounter S51.801A Unspecified open wound of right forearm, initial encounte E11.622 Type 2 diabetes mellitus with other skin ulcer Modifier: 25 r Quantity: 1 CPT4 Code: [id number] Description: 95320 - WC PHYS SUBQ TISS 20 SQ CM ICD-10 Diagnosis Description S51.801A Unspecified open wound of right forearm, initial encounte Modifier: r Quantity: 1 Electronic Signature(s) Signed: 03/06/2018 11:59:27 PM By: Worthy Keeler PA-C Entered By: Worthy Keeler on 03/06/2018 17:16:00

## 2018-03-07 NOTE — Progress Notes (Signed)
Onofrio, MARKAS ALDREDGE ([id number]) Visit Report for 03/06/2018 Allergy List Details Patient Name: Roy [name], [name] Date of Service: 03/06/2018 8:00 AM Medical Record Number: [id number] Patient Account Number: [id number] Date of Birth/Sex: 1932-10-31 (85 y.o. M) Treating RN: Harold Barban Primary Care Rodriguez Aguinaldo: [name] Other Clinician: Referring Anea Fodera: Joni Fears, PHILLIP Treating Eaven Schwager/Extender: STONE III, HOYT Weeks in Treatment: 0 Allergies Active Allergies codeine Reaction: chest pain Severity: Moderate Crestor Reaction: joint pain, rash Severity: Moderate cephalexin Severity: Mild Allergy Notes Electronic Signature(s) Signed: 03/06/2018 3:53:38 PM By: Harold Barban Entered By: Harold Barban on 03/06/2018 08:15:21 Lambe, Carole Alfonso Patten ([id number]) -------------------------------------------------------------------------------- Arrival Information Details Patient Name: Roy [name], [name] Date of Service: 03/06/2018 8:00 AM Medical Record Number: [id number] Patient Account Number: [id number] Date of Birth/Sex: 1932/11/10 (85 y.o. M) Treating RN: Harold Barban Primary Care Morgann Woodburn: [name] Other Clinician: Referring Maximillian Habibi: Carrie Mew Treating Tanieka Pownall/Extender: Melburn Hake, HOYT Weeks in Treatment: 0 Visit Information Patient Arrived: Ambulatory Arrival Time: 08:08 Accompanied By: family Transfer Assistance: None Patient Identification Verified: Yes Secondary Verification Process Completed: Yes Notes patient returned to clinic at 940 with right arm bleeding Electronic Signature(s) Signed: 03/06/2018 4:35:36 PM By: Montey Hora Entered By: Montey Hora on 03/06/2018 09:52:05 Lehmkuhl, Nakeem RMarland Kitchen ([id number]) -------------------------------------------------------------------------------- Clinic Level of Care Assessment Details Patient Name: Roy [name], [name] Date of Service: 03/06/2018 8:00 AM Medical Record Number: [id number] Patient Account Number:  [id number] Date of Birth/Sex: 01/23/1933 (85 y.o. M) Treating RN: Montey Hora Primary Care Bexton Haak: [name] Other Clinician: Referring Tahje Borawski: Joni Fears, PHILLIP Treating Kendell Gammon/Extender: STONE III, HOYT Weeks in Treatment: 0 Clinic Level of Care Assessment Items TOOL 1 Quantity Score []  - Use when EandM and Procedure is performed on INITIAL visit 0 ASSESSMENTS - Nursing Assessment / Reassessment X - General Physical Exam (combine w/ comprehensive assessment (listed just below) when 1 20 performed on new pt. evals) X- 1 25 Comprehensive Assessment (HX, ROS, Risk Assessments, Wounds Hx, etc.) ASSESSMENTS - Wound and Skin Assessment / Reassessment []  - Dermatologic / Skin Assessment (not related to wound area) 0 ASSESSMENTS - Ostomy and/or Continence Assessment and Care []  - Incontinence Assessment and Management 0 []  - 0 Ostomy Care Assessment and Management (repouching, etc.) PROCESS - Coordination of Care X - Simple Patient / Family Education for ongoing care 1 15 []  - 0 Complex (extensive) Patient / Family Education for ongoing care X- 1 10 Staff obtains Programmer, systems, Records, Test Results / Process Orders []  - 0 Staff telephones HHA, Nursing Homes / Clarify orders / etc []  - 0 Routine Transfer to another Facility (non-emergent condition) []  - 0 Routine Hospital Admission (non-emergent condition) X- 1 15 New Admissions / Biomedical engineer / Ordering NPWT, Apligraf, etc. []  - 0 Emergency Hospital Admission (emergent condition) PROCESS - Special Needs []  - Pediatric / Minor Patient Management 0 []  - 0 Isolation Patient Management []  - 0 Hearing / Language / Visual special needs []  - 0 Assessment of Community assistance (transportation, D/C planning, etc.) []  - 0 Additional assistance / Altered mentation []  - 0 Support Surface(s) Assessment (bed, cushion, seat, etc.) [place]ham, Vail R. ([id number]) INTERVENTIONS - Miscellaneous []  - External ear exam  0 []  - 0 Patient Transfer (multiple staff / Civil Service fast streamer / Similar devices) []  - 0 Simple Staple / Suture removal (25 or less) []  - 0 Complex Staple / Suture removal (26 or more) []  - 0 Hypo/Hyperglycemic Management (do not check if billed separately) []  - 0 Ankle / Brachial Index (ABI) - do not check if billed separately Has  the patient been seen at the hospital within the last three years: Yes Total Score: 85 Level Of Care: New/Established - Level 3 Electronic Signature(s) Signed: 03/06/2018 4:35:36 PM By: Montey Hora Entered By: Montey Hora on 03/06/2018 09:50:26 Roy [name], Roy [name] ([id number]) -------------------------------------------------------------------------------- Encounter Discharge Information Details Patient Name: Roy [name], [name] Date of Service: 03/06/2018 8:00 AM Medical Record Number: [id number] Patient Account Number: [id number] Date of Birth/Sex: 1932/08/08 (85 y.o. M) Treating RN: Montey Hora Primary Care Adrian Specht: [name] Other Clinician: Referring Naithan Delage: STAFFORD, PHILLIP Treating Lorielle Boehning/Extender: STONE III, HOYT Weeks in Treatment: 0 Encounter Discharge Information Items Post Procedure Vitals Discharge Condition: Stable Temperature (F): 97.8 Ambulatory Status: Ambulatory Pulse (bpm): 55 Discharge Destination: Home Respiratory Rate (breaths/min): 18 Transportation: Private Auto Blood Pressure (mmHg): 153/59 Accompanied By: self Schedule Follow-up Appointment: Yes Clinical Summary of Care: Electronic Signature(s) Signed: 03/06/2018 4:35:36 PM By: Montey Hora Entered By: Montey Hora on 03/06/2018 10:01:36 Stagner, Roy [name] ([id number]) -------------------------------------------------------------------------------- Lower Extremity Assessment Details Patient Name: Roy [name], [name] Date of Service: 03/06/2018 8:00 AM Medical Record Number: [id number] Patient Account Number: [id number] Date of Birth/Sex: [birth year] (85 y.o. M) Treating RN:  Harold Barban Primary Care Lakiah Dhingra: [name] Other Clinician: Referring Maxey Ransom: Joni Fears, PHILLIP Treating Joachim Carton/Extender: Melburn Hake, HOYT Weeks in Treatment: 0 Electronic Signature(s) Signed: 03/06/2018 3:53:38 PM By: Harold Barban Entered By: Harold Barban on 03/06/2018 08:36:44 Moraes, Geordan R. ([id number]) -------------------------------------------------------------------------------- Multi Wound Chart Details Patient Name: Roy [name], [name] Date of Service: 03/06/2018 8:00 AM Medical Record Number: [id number] Patient Account Number: [id number] Date of Birth/Sex: 1932/07/09 (85 y.o. M) Treating RN: Harold Barban Primary Care Merrit Waugh: [name] Other Clinician: Referring Anwyn Kriegel: STAFFORD, PHILLIP Treating Elva Breaker/Extender: STONE III, HOYT Weeks in Treatment: 0 Vital Signs Height(in): 69 Pulse(bpm): 55 Weight(lbs): 181 Blood Pressure(mmHg): 153/59 Body Mass Index(BMI): 27 Temperature(F): 97.8 Respiratory Rate 16 (breaths/min): Photos: [1:No Photos] [N/A:N/A] Wound Location: [1:Right Forearm] [N/A:N/A] Wounding Event: [1:Trauma] [N/A:N/A] Primary Etiology: [1:Trauma, Other] [N/A:N/A] Comorbid History: [1:Sleep Apnea, Coronary Artery N/A Disease, Hypertension, Type II Diabetes] Date Acquired: [1:03/01/2018] [N/A:N/A] Weeks of Treatment: [1:0] [N/A:N/A] Wound Status: [1:Open] [N/A:N/A] Measurements L x W x D [1:5x3.5x0.1] [N/A:N/A] (cm) Area (cm) : [1:13.744] [N/A:N/A] Volume (cm) : [1:1.374] [N/A:N/A] Classification: [1:Full Thickness Without Exposed Support Structures] [N/A:N/A] Exudate Amount: [1:Large] [N/A:N/A] Exudate Type: [1:Serosanguineous] [N/A:N/A] Exudate Color: [1:red, brown] [N/A:N/A] Wound Margin: [1:Flat and Intact] [N/A:N/A] Granulation Amount: [1:None Present (0%)] [N/A:N/A] Necrotic Amount: [1:Large (67-100%)] [N/A:N/A] Exposed Structures: [1:Fat Layer (Subcutaneous Tissue) Exposed: Yes Fascia: No Tendon: No Muscle: No  Joint: No Bone: No] [N/A:N/A] Epithelialization: [1:None] [N/A:N/A] Periwound Skin Texture: [1:Excoriation: Yes Induration: No Callus: No Crepitus: No Rash: No Scarring: No] [N/A:N/A] Periwound Skin Moisture: [N/A:N/A] Maceration: No Dry/Scaly: No Periwound Skin Color: Hemosiderin Staining: Yes N/A N/A Atrophie Blanche: No Cyanosis: No Ecchymosis: No Erythema: No Mottled: No Pallor: No Rubor: No Tenderness on Palpation: No N/A N/A Wound Preparation: Ulcer Cleansing: N/A N/A Rinsed/Irrigated with Saline Topical Anesthetic Applied: Other: lidocaine 4% Treatment Notes Electronic Signature(s) Signed: 03/06/2018 3:53:38 PM By: Harold Barban Entered By: Harold Barban on 03/06/2018 08:45:28 Ditullio, Roy [name] ([id number]) -------------------------------------------------------------------------------- Multi-Disciplinary Care Plan Details Patient Name: Roy [name], [name] Date of Service: 03/06/2018 8:00 AM Medical Record Number: [id number] Patient Account Number: [id number] Date of Birth/Sex: Jul 11, 1932 (85 y.o. M) Treating RN: Harold Barban Primary Care Remon Quinto: [name] Other Clinician: Referring Loraine Freid: Carrie Mew Treating Kennadie Brenner/Extender: Melburn Hake, HOYT Weeks in Treatment: 0 Active Inactive Abuse / Safety / Falls / Self Care Management Nursing Diagnoses: Potential for falls Goals: Patient  will not experience any injury related to falls Date Initiated: 03/06/2018 Target Resolution Date: 06/07/2018 Goal Status: Active Interventions: Assess fall risk on admission and as needed Notes: Orientation to the Wound Care Program Nursing Diagnoses: Knowledge deficit related to the wound healing center program Goals: Patient/caregiver will verbalize understanding of the [facility] Date Initiated: 03/06/2018 Target Resolution Date: 06/07/2018 Goal Status: Active Interventions: Provide education on orientation to the wound center Notes: Wound/Skin  Impairment Nursing Diagnoses: Impaired tissue integrity Goals: Ulcer/skin breakdown will have a volume reduction of 30% by week 4 Date Initiated: 03/06/2018 Target Resolution Date: 04/06/2018 Goal Status: Active Interventions: Assess patient/caregiver ability to obtain necessary supplies Roy [name], [name] ([id number]) Assess patient/caregiver ability to perform ulcer/skin care regimen upon admission and as needed Assess ulceration(s) every visit Notes: Electronic Signature(s) Signed: 03/06/2018 3:53:38 PM By: Harold Barban Signed: 03/06/2018 4:35:36 PM By: Montey Hora Entered By: Montey Hora on 03/06/2018 09:50:04 Tumbleson, Gardy R. ([id number]) -------------------------------------------------------------------------------- Pain Assessment Details Patient Name: Roy [name], [name] Date of Service: 03/06/2018 8:00 AM Medical Record Number: [id number] Patient Account Number: [id number] Date of Birth/Sex: [birth year] (85 y.o. M) Treating RN: Harold Barban Primary Care Ahlani Wickes: [name] Other Clinician: Referring Merleen Picazo: Joni Fears, PHILLIP Treating Gazella Anglin/Extender: STONE III, HOYT Weeks in Treatment: 0 Active Problems Location of Pain Severity and Description of Pain Patient Has Paino Yes Site Locations Rate the pain. Current Pain Level: 3 Pain Management and Medication Current Pain Management: Electronic Signature(s) Signed: 03/06/2018 3:53:38 PM By: Harold Barban Entered By: Harold Barban on 03/06/2018 08:11:41 Silvio, Roy [name] ([id number]) -------------------------------------------------------------------------------- Patient/Caregiver Education Details Patient Name: Roy [name], [name] Date of Service: 03/06/2018 8:00 AM Medical Record Number: [id number] Patient Account Number: [id number] Date of Birth/Gender: 07-Apr-1932 (85 y.o. M) Treating RN: Harold Barban Primary Care Physician: [name] Other Clinician: Referring Physician: Carrie Mew Treating  Physician/Extender: Sharalyn Ink in Treatment: 0 Education Assessment Education Provided To: Patient Education Topics Provided Welcome To [facility]: Handouts: [facility] Methods: Demonstration, Explain/Verbal Responses: State content correctly Wound/Skin Impairment: Handouts: Caring for Your Ulcer Methods: Demonstration, Explain/Verbal Responses: State content correctly Electronic Signature(s) Signed: 03/06/2018 3:53:38 PM By: Harold Barban Entered By: Harold Barban on 03/06/2018 08:45:52 Cocke, Ossie R. ([id number]) -------------------------------------------------------------------------------- Wound Assessment Details Patient Name: Roy [name], [name] Date of Service: 03/06/2018 8:00 AM Medical Record Number: [id number] Patient Account Number: [id number] Date of Birth/Sex: 1932/11/25 (85 y.o. M) Treating RN: Harold Barban Primary Care Alycia Cooperwood: [name] Other Clinician: Referring Amanda Steuart: STAFFORD, PHILLIP Treating Pragya Lofaso/Extender: STONE III, HOYT Weeks in Treatment: 0 Wound Status Wound Number: 1 Primary Trauma, Other Etiology: Wound Location: Right Forearm Wound Open Wounding Event: Trauma Status: Date Acquired: 03/01/2018 Comorbid Sleep Apnea, Coronary Artery Disease, Weeks Of Treatment: 0 History: Hypertension, Type II Diabetes Clustered Wound: No Photos Photo Uploaded By: Harold Barban on 03/06/2018 15:03:57 Wound Measurements Length: (cm) 5 Width: (cm) 3.5 Depth: (cm) 0.1 Area: (cm) 13.744 Volume: (cm) 1.374 % Reduction in Area: % Reduction in Volume: Epithelialization: None Tunneling: No Undermining: No Wound Description Full Thickness Without Exposed Support Classification: Structures Wound Margin: Flat and Intact Exudate Large Amount: Exudate Type: Serosanguineous Exudate Color: red, brown Foul Odor After Cleansing: No Slough/Fibrino Yes Wound Bed Granulation Amount: None Present (0%)  Exposed Structure Necrotic Amount: Large (67-100%) Fascia Exposed: No Necrotic Quality: Adherent Slough Fat Layer (Subcutaneous Tissue) Exposed: Yes Tendon Exposed: No Muscle Exposed: No Joint Exposed: No Bone Exposed: No Casasola, Sye R. ([id number]) Periwound Skin Texture Texture Color No  Abnormalities Noted: No No Abnormalities Noted: No Callus: No Atrophie Blanche: No Crepitus: No Cyanosis: No Excoriation: Yes Ecchymosis: No Induration: No Erythema: No Rash: No Hemosiderin Staining: Yes Scarring: No Mottled: No Pallor: No Moisture Rubor: No No Abnormalities Noted: No Dry / Scaly: No Maceration: No Wound Preparation Ulcer Cleansing: Rinsed/Irrigated with Saline Topical Anesthetic Applied: Other: lidocaine 4%, Treatment Notes Wound #1 (Right Forearm) Notes xeroform, silvercel, abd, conform secured with coban Electronic Signature(s) Signed: 03/06/2018 3:53:38 PM By: Harold Barban Entered By: Harold Barban on 03/06/2018 08:36:16 Haggart, Crosby R. ([id number]) -------------------------------------------------------------------------------- Vitals Details Patient Name: Roy [name], [name] Date of Service: 03/06/2018 8:00 AM Medical Record Number: [id number] Patient Account Number: [id number] Date of Birth/Sex: 05/26/1932 (85 y.o. M) Treating RN: Harold Barban Primary Care Amry Cathy: [name] Other Clinician: Referring Arlen Dupuis: STAFFORD, PHILLIP Treating Kolson Chovanec/Extender: STONE III, HOYT Weeks in Treatment: 0 Vital Signs Time Taken: 08:11 Temperature (F): 97.8 Height (in): 69 Pulse (bpm): 55 Source: Stated Respiratory Rate (breaths/min): 16 Weight (lbs): 181 Blood Pressure (mmHg): 153/59 Source: Stated Reference Range: 80 - 120 mg / dl Body Mass Index (BMI): 26.7 Electronic Signature(s) Signed: 03/06/2018 3:53:38 PM By: Harold Barban Entered By: Harold Barban on 03/06/2018 08:13:32

## 2018-03-08 ENCOUNTER — Other Ambulatory Visit: Payer: Self-pay | Admitting: Family Medicine

## 2018-03-10 ENCOUNTER — Ambulatory Visit: Payer: PPO | Admitting: Physician Assistant

## 2018-03-13 ENCOUNTER — Encounter: Payer: PPO | Admitting: Physician Assistant

## 2018-03-13 DIAGNOSIS — S51851A Open bite of right forearm, initial encounter: Secondary | ICD-10-CM | POA: Diagnosis not present

## 2018-03-13 DIAGNOSIS — S51801A Unspecified open wound of right forearm, initial encounter: Secondary | ICD-10-CM | POA: Diagnosis not present

## 2018-03-15 NOTE — Progress Notes (Signed)
Schlachter, JARRELL ARMOND ([id number]) Visit Report for 03/13/2018 Chief Complaint Document Details Patient Name: Shipes, [name] Date of Service: 03/13/2018 9:30 AM Medical Record Number: [id number] Patient Account Number: [id number] Date of Birth/Sex: 27-Mar-1932 (85 y.o. M) Treating RN: Montey Hora Primary Care Provider: [name] Other Clinician: Referring Provider: [name] Treating Provider/Extender: Melburn Hake, HOYT Weeks in Treatment: 1 Information Obtained from: Patient Chief Complaint Donkey bite/Trauma right forearm Electronic Signature(s) Signed: 03/13/2018 10:02:40 AM By: Worthy Keeler PA-C Entered By: Worthy Keeler on 03/13/2018 09:35:41 Kienle, Leamon R. ([id number]) -------------------------------------------------------------------------------- Debridement Details Patient Name: Apostol, Damarion R. Date of Service: 03/13/2018 9:30 AM Medical Record Number: [id number] Patient Account Number: [id number] Date of Birth/Sex: [birth year] (85 y.o. M) Treating RN: Harold Barban Primary Care Provider: [name] Other Clinician: Referring Provider: [name] Treating Provider/Extender: Melburn Hake, HOYT Weeks in Treatment: 1 Debridement Performed for Wound #1 Right Forearm Assessment: Performed By: Physician STONE III, HOYT E., PA-C Debridement Type: Debridement Level of Consciousness (Pre- Awake and Alert procedure): Pre-procedure Verification/Time Yes - 09:39 Out Taken: Start Time: 09:39 Pain Control: Lidocaine Total Area Debrided (L x W): 4.2 (cm) x 3.1 (cm) = 13.02 (cm) Tissue and other material Viable, Slough, Subcutaneous, Slough debrided: Level: Skin/Subcutaneous Tissue Debridement Description: Excisional Instrument: Curette Bleeding: Moderate Hemostasis Achieved: Pressure End Time: 09:42 Procedural Pain: 0 Post Procedural Pain: 0 Response to Treatment: Procedure was tolerated well Level of Consciousness Awake and Alert (Post-procedure): Post  Debridement Measurements of Total Wound Length: (cm) 4.2 Width: (cm) 3.1 Depth: (cm) 0.2 Volume: (cm) 2.045 Character of Wound/Ulcer Post Debridement: Improved Post Procedure Diagnosis Same as Pre-procedure Electronic Signature(s) Signed: 03/13/2018 10:02:40 AM By: Worthy Keeler PA-C Signed: 03/13/2018 2:41:13 PM By: Harold Barban Entered By: Harold Barban on 03/13/2018 09:40:49 Manygoats, Travin R. ([id number]) -------------------------------------------------------------------------------- HPI Details Patient Name: Codd, Aime R. Date of Service: 03/13/2018 9:30 AM Medical Record Number: [id number] Patient Account Number: [id number] Date of Birth/Sex: April 27, 1932 (85 y.o. M) Treating RN: Montey Hora Primary Care Provider: [name] Other Clinician: Referring Provider: [name] Treating Provider/Extender: Melburn Hake, HOYT Weeks in Treatment: 1 History of Present Illness HPI Description: 03/06/18 on evaluation today patient appears to be doing okay upon initial evaluation here in our clinic. He does have a skin tear which occurred on the right form which was a result of having been bitten by his daughter's donkey. He states that the animal is normally very mild tempered and in fact was not even mad when this occurred he had an Scarlett and was attempting to feed the donkey the donkey missed and biting for the Srey and got his arm through his coat. It did not pierce the coat but nonetheless just the pressure of the bite itself because the skin breakdown. Nonetheless he did have a significant skin tear which required that he go to the hospital at the emergency department on Saturday, March 01, 2018. Subsequently he was given clindamycin 300 mg four times a day. He states that his the does have diabetes and is A1c is around 5.1 at the last check. No fevers chills noted he does tell me that they had a difficult time getting his breathing under control at the emergency department  when he was there. Fortunately this does not seem to be waiting separately this point although it is going to require some debridement bleeding may become an issue. Otherwise the patient has no other major medical problems and aspirin is the only blood thinner that he is on at  this point. 03/13/18 on evaluation today patient's arm appears to be doing significantly better which is good news. He's been tolerating the dressing changes without complication. Fortunately there is no sign of infection. No fevers, chills, nausea, or vomiting noted at this time. Electronic Signature(s) Signed: 03/13/2018 10:06:59 AM By: Worthy Keeler PA-C Entered By: Worthy Keeler on 03/13/2018 10:03:38 Ragas, Pincus Sanes ([id number]) -------------------------------------------------------------------------------- Physical Exam Details Patient Name: Robison, Ronelle R. Date of Service: 03/13/2018 9:30 AM Medical Record Number: [id number] Patient Account Number: [id number] Date of Birth/Sex: 1932-12-13 (85 y.o. M) Treating RN: Montey Hora Primary Care Provider: [name] Other Clinician: Referring Provider: [name] Treating Provider/Extender: STONE III, HOYT Weeks in Treatment: 1 Constitutional Well-nourished and well-hydrated in no acute distress. Respiratory normal breathing without difficulty. Psychiatric this patient is able to make decisions and demonstrates good insight into disease process. Alert and Oriented x 3. pleasant and cooperative. Notes Patient's wound does show some Slough noted in the service of the wound is will some hyper granulation. For that reason I did perform sharp debridement today to clear some of this away I was very careful in doing so is that we did not end up with a bleeding issue like we did at the last visit. He tolerated this without complication. Electronic Signature(s) Signed: 03/13/2018 10:06:59 AM By: Worthy Keeler PA-C Entered By: Worthy Keeler on 03/13/2018  10:04:06 Payette, Pincus Sanes ([id number]) -------------------------------------------------------------------------------- Physician Orders Details Patient Name: Pitera, Gabrian R. Date of Service: 03/13/2018 9:30 AM Medical Record Number: [id number] Patient Account Number: [id number] Date of Birth/Sex: 09-Feb-1933 (85 y.o. M) Treating RN: Harold Barban Primary Care Provider: [name] Other Clinician: Referring Provider: [name] Treating Provider/Extender: Melburn Hake, HOYT Weeks in Treatment: 1 Verbal / Phone Orders: No Diagnosis Coding ICD-10 Coding Code Description W55.81XA Bitten by other mammals, initial encounter S51.801A Unspecified open wound of right forearm, initial encounter E11.622 Type 2 diabetes mellitus with other skin ulcer Wound Cleansing Wound #1 Right Forearm o Cleanse wound with mild soap and water - Clean wound with Dial antibacterial soap (pump), gently pat to dry. o May Shower, gently pat wound dry prior to applying new dressing. Primary Wound Dressing Wound #1 Right Forearm o Hydrafera Blue Ready Transfer Secondary Dressing Wound #1 Right Forearm o Dry Gauze o Conform/Kerlix o Coban Dressing Change Frequency Wound #1 Right Forearm o Change dressing every other day. Follow-up Appointments Wound #1 Right Forearm o Return Appointment in 1 week. Electronic Signature(s) Signed: 03/13/2018 10:02:40 AM By: Worthy Keeler PA-C Signed: 03/13/2018 2:41:13 PM By: Harold Barban Entered By: Harold Barban on 03/13/2018 09:45:14 Renninger, Graylon RMarland Kitchen ([id number]) -------------------------------------------------------------------------------- Problem List Details Patient Name: Gerke, Jewel R. Date of Service: 03/13/2018 9:30 AM Medical Record Number: [id number] Patient Account Number: [id number] Date of Birth/Sex: 1932/08/20 (85 y.o. M) Treating RN: Montey Hora Primary Care Provider: [name] Other Clinician: Referring Provider: [name] Treating Provider/Extender: Melburn Hake, HOYT Weeks in Treatment: 1 Active Problems ICD-10 Evaluated Encounter Code Description Active Date Today Diagnosis W55.81XA Bitten by other mammals, initial encounter 03/06/2018 No Yes S51.801A Unspecified open wound of right forearm, initial encounter 03/06/2018 No Yes E11.622 Type 2 diabetes mellitus with other skin ulcer 03/06/2018 No Yes Inactive Problems Resolved Problems Electronic Signature(s) Signed: 03/13/2018 10:02:40 AM By: Worthy Keeler PA-C Entered By: Worthy Keeler on 03/13/2018 09:35:35 Raetz, Exmore ([id number]) -------------------------------------------------------------------------------- Progress Note Details Patient Name: Janota, Kayceon R. Date of Service: 03/13/2018 9:30 AM Medical Record Number: [id number] Patient Account Number:  [id number] Date of Birth/Sex: [birth year] (85 y.o. M) Treating RN: Montey Hora Primary Care Provider: [name] Other Clinician: Referring Provider: [name] Treating Provider/Extender: Melburn Hake, HOYT Weeks in Treatment: 1 Subjective Chief Complaint Information obtained from Patient Pollie Friar bite/Trauma right forearm History of Present Illness (HPI) 03/06/18 on evaluation today patient appears to be doing okay upon initial evaluation here in our clinic. He does have a skin tear which occurred on the right form which was a result of having been bitten by his daughter's donkey. He states that the animal is normally very mild tempered and in fact was not even mad when this occurred he had an Warrell and was attempting to feed the donkey the donkey missed and biting for the Brann and got his arm through his coat. It did not pierce the coat but nonetheless just the pressure of the bite itself because the skin breakdown. Nonetheless he did have a significant skin tear which required that he go to the hospital at the emergency department on Saturday, March 01, 2018. Subsequently he was  given clindamycin 300 mg four times a day. He states that his the does have diabetes and is A1c is around 5.1 at the last check. No fevers chills noted he does tell me that they had a difficult time getting his breathing under control at the emergency department when he was there. Fortunately this does not seem to be waiting separately this point although it is going to require some debridement bleeding may become an issue. Otherwise the patient has no other major medical problems and aspirin is the only blood thinner that he is on at this point. 03/13/18 on evaluation today patient's arm appears to be doing significantly better which is good news. He's been tolerating the dressing changes without complication. Fortunately there is no sign of infection. No fevers, chills, nausea, or vomiting noted at this time. Patient History Information obtained from Patient. Family History Cancer - Siblings, Diabetes - Siblings, Heart Disease - Siblings, Kidney Disease - Siblings, No family history of Hereditary Spherocytosis, Hypertension, Lung Disease, Seizures, Stroke, Thyroid Problems, Tuberculosis. Social History Former smoker - ended on 03/05/1958, Marital Status - Married, Alcohol Use - Never, Drug Use - No History, Caffeine Use - Daily. Medical And Surgical History Notes Hematologic/Lymphatic Thrombocytopenia Review of Systems (ROS) Constitutional Symptoms (General Health) Denies complaints or symptoms of Fever, Chills. Respiratory The patient has no complaints or symptoms. Cardiovascular The patient has no complaints or symptoms. Psychiatric Loper, YAHMIR SOKOLOV. ([id number]) The patient has no complaints or symptoms. Objective Constitutional Well-nourished and well-hydrated in no acute distress. Vitals Time Taken: 9:20 AM, Height: 69 in, Weight: 181 lbs, BMI: 26.7, Temperature: 97.6 F, Pulse: 57 bpm, Respiratory Rate: 16 breaths/min, Blood Pressure: 147/57 mmHg. Respiratory normal breathing  without difficulty. Psychiatric this patient is able to make decisions and demonstrates good insight into disease process. Alert and Oriented x 3. pleasant and cooperative. General Notes: Patient's wound does show some Slough noted in the service of the wound is will some hyper granulation. For that reason I did perform sharp debridement today to clear some of this away I was very careful in doing so is that we did not end up with a bleeding issue like we did at the last visit. He tolerated this without complication. Integumentary (Hair, Skin) Wound #1 status is Open. Original cause of wound was Trauma. The wound is located on the Right Forearm. The wound measures 4.2cm length x 3.1cm width x 0.2cm depth; 10.226cm^2 area  and 2.045cm^3 volume. There is Fat Layer (Subcutaneous Tissue) Exposed exposed. There is no tunneling or undermining noted. There is a large amount of serosanguineous drainage noted. The wound margin is flat and intact. There is large (67-100%) pink granulation within the wound bed. There is a small (1-33%) amount of necrotic tissue within the wound bed including Adherent Slough. The periwound skin appearance exhibited: Excoriation, Hemosiderin Staining. The periwound skin appearance did not exhibit: Callus, Crepitus, Induration, Rash, Scarring, Dry/Scaly, Maceration, Atrophie Blanche, Cyanosis, Ecchymosis, Mottled, Pallor, Rubor, Erythema. Periwound temperature was noted as No Abnormality. Assessment Active Problems ICD-10 Bitten by other mammals, initial encounter Unspecified open wound of right forearm, initial encounter Type 2 diabetes mellitus with other skin ulcer Largo, Jasiri R. ([id number]) Procedures Wound #1 Pre-procedure diagnosis of Wound #1 is a Trauma, Other located on the Right Forearm . There was a Excisional Skin/Subcutaneous Tissue Debridement with a total area of 13.02 sq cm performed by STONE III, HOYT E., PA-C. With the following instrument(s): Curette  to remove Viable tissue/material. Material removed includes Subcutaneous Tissue and Slough and after achieving pain control using Lidocaine. No specimens were taken. A time out was conducted at 09:39, prior to the start of the procedure. A Moderate amount of bleeding was controlled with Pressure. The procedure was tolerated well with a pain level of 0 throughout and a pain level of 0 following the procedure. Post Debridement Measurements: 4.2cm length x 3.1cm width x 0.2cm depth; 2.045cm^3 volume. Character of Wound/Ulcer Post Debridement is improved. Post procedure Diagnosis Wound #1: Same as Pre-Procedure Plan Wound Cleansing: Wound #1 Right Forearm: Cleanse wound with mild soap and water - Clean wound with Dial antibacterial soap (pump), gently pat to dry. May Shower, gently pat wound dry prior to applying new dressing. Primary Wound Dressing: Wound #1 Right Forearm: Hydrafera Blue Ready Transfer Secondary Dressing: Wound #1 Right Forearm: Dry Gauze Conform/Kerlix Coban Dressing Change Frequency: Wound #1 Right Forearm: Change dressing every other day. Follow-up Appointments: Wound #1 Right Forearm: Return Appointment in 1 week. Due to the hyper granulation at this point my suggestion is gonna be that we go ahead and utilize [employer] the dressing of choice going forward for his wound. The patient is in agreement with plan. We will subsequently see were things stand at follow-up. Please see above for specific wound care orders. We will see patient for re-evaluation in 1 week(s) here in the clinic. If anything worsens or changes patient will contact our office for additional recommendations. Electronic Signature(s) Signed: 03/13/2018 10:06:59 AM By: Worthy Keeler PA-C Entered By: Worthy Keeler on 03/13/2018 10:04:30 Mangel, Pincus Sanes ([id number]) -------------------------------------------------------------------------------- ROS/PFSH Details Patient Name: Sailors,  Kru R. Date of Service: 03/13/2018 9:30 AM Medical Record Number: [id number] Patient Account Number: [id number] Date of Birth/Sex: 03-28-1932 (85 y.o. M) Treating RN: Montey Hora Primary Care Provider: [name] Other Clinician: Referring Provider: [name] Treating Provider/Extender: Melburn Hake, HOYT Weeks in Treatment: 1 Information Obtained From Patient Wound History Do you currently have one or more open woundso Yes How many open wounds do you currently haveo 1 Approximately how long have you had your woundso March 01, 2018 How have you been treating your wound(s) until nowo coban, soap and water Has your wound(s) ever healed and then re-openedo No Have you had any lab work done in the past montho No Have you tested positive for an antibiotic resistant organism (MRSA, VRE)o No Have you tested positive for osteomyelitis (bone infection)o No Have you had  any tests for circulation on your legso No Have you had other problems associated with your woundso Swelling Constitutional Symptoms (General Health) Complaints and Symptoms: Negative for: Fever; Chills Hematologic/Lymphatic Medical History: Past Medical History Notes: Thrombocytopenia Respiratory Complaints and Symptoms: No Complaints or Symptoms Medical History: Positive for: Sleep Apnea Cardiovascular Complaints and Symptoms: No Complaints or Symptoms Medical History: Positive for: Coronary Artery Disease; Hypertension Endocrine Medical History: Positive for: Type II Diabetes Time with diabetes: 8 years Weinman, Rosa R. ([id number]) Psychiatric Complaints and Symptoms: No Complaints or Symptoms Immunizations Pneumococcal Vaccine: Received Pneumococcal Vaccination: Yes Implantable Devices Family and Social History Cancer: Yes - Siblings; Diabetes: Yes - Siblings; Heart Disease: Yes - Siblings; Hereditary Spherocytosis: No; Hypertension: No; Kidney Disease: Yes - Siblings; Lung Disease: No;  Seizures: No; Stroke: No; Thyroid Problems: No; Tuberculosis: No; Former smoker - ended on 03/05/1958; Marital Status - Married; Alcohol Use: Never; Drug Use: No History; Caffeine Use: Daily; Financial Concerns: No; Food, Clothing or Shelter Needs: No; Support System Lacking: No; Transportation Concerns: No; Advanced Directives: No; Patient does not want information on Advanced Directives; Living Will: No; Medical Power of Attorney: No Physician Affirmation I have reviewed and agree with the above information. Electronic Signature(s) Signed: 03/13/2018 10:06:59 AM By: Worthy Keeler PA-C Signed: 03/13/2018 5:07:23 PM By: Montey Hora Entered By: Worthy Keeler on 03/13/2018 10:03:56 Derasmo, Lanier R. ([id number]) -------------------------------------------------------------------------------- SuperBill Details Patient Name: Autry, Romie R. Date of Service: 03/13/2018 Medical Record Number: [id number] Patient Account Number: [id number] Date of Birth/Sex: [birth year] (85 y.o. M) Treating RN: Montey Hora Primary Care Provider: [name] Other Clinician: Referring Provider: [name] Treating Provider/Extender: Melburn Hake, HOYT Weeks in Treatment: 1 Diagnosis Coding ICD-10 Codes Code Description W55.81XA Bitten by other mammals, initial encounter S51.801A Unspecified open wound of right forearm, initial encounter E11.622 Type 2 diabetes mellitus with other skin ulcer Facility Procedures CPT4 Code: 12751700 Description: 17494 - DEB SUBQ TISSUE 20 SQ CM/< ICD-10 Diagnosis Description S51.801A Unspecified open wound of right forearm, initial encounte Modifier: r Quantity: 1 Physician Procedures CPT4 Code: 4967591 Description: 63846 - WC PHYS SUBQ TISS 20 SQ CM ICD-10 Diagnosis Description K59.935T Unspecified open wound of right forearm, initial encounte Modifier: r Quantity: 1 Electronic Signature(s) Signed: 03/13/2018 10:06:59 AM By: Worthy Keeler PA-C Entered By: Worthy Keeler on 03/13/2018 10:04:46

## 2018-03-15 NOTE — Progress Notes (Signed)
Northcraft, MANNING LUNA ([id number]) Visit Report for 03/13/2018 Arrival Information Details Patient Name: Roy [name], Roy [name]. Date of Service: 03/13/2018 9:30 AM Medical Record Number: [id number] Patient Account Number: [id number] Date of Birth/Sex: 02/09/1933 (85 y.o. M) Treating RN: Montey Hora Primary Care Hasaan Radde: [name] Other Clinician: Referring Mete Purdum: [name] Treating Alysabeth Scalia/Extender: Melburn Hake, HOYT Weeks in Treatment: 1 Visit Information History Since Last Visit Added or deleted any medications: No Patient Arrived: Ambulatory Any new allergies or adverse reactions: No Arrival Time: 09:19 Had a fall or experienced change in No Accompanied By: wife activities of daily living that may affect Transfer Assistance: None risk of falls: Signs or symptoms of abuse/neglect since last visito No Hospitalized since last visit: No Implantable device outside of the clinic excluding No cellular tissue based products placed in the center since last visit: Has Dressing in Place as Prescribed: Yes Pain Present Now: No Electronic Signature(s) Signed: 03/14/2018 3:31:07 PM By: Lorine Bears RCP, RRT, CHT Entered By: Lorine Bears on 03/13/2018 09:20:04 Boot, Pincus Sanes ([id number]) -------------------------------------------------------------------------------- Encounter Discharge Information Details Patient Name: Trimmer, Archit R. Date of Service: 03/13/2018 9:30 AM Medical Record Number: [id number] Patient Account Number: [id number] Date of Birth/Sex: [birth year] (85 y.o. M) Treating RN: Cornell Barman Primary Care Clayson Riling: [name] Other Clinician: Referring Nichola Cieslinski: [name] Treating Lajoya Dombek/Extender: Melburn Hake, HOYT Weeks in Treatment: 1 Encounter Discharge Information Items Post Procedure Vitals Discharge Condition: Stable Temperature (F): 97.6 Ambulatory Status: Ambulatory Pulse (bpm): 57 Discharge Destination: Home Respiratory  Rate (breaths/min): 16 Transportation: Private Auto Blood Pressure (mmHg): 147/57 Accompanied By: wife Schedule Follow-up Appointment: Yes Clinical Summary of Care: Electronic Signature(s) Signed: 03/14/2018 3:19:28 PM By: Gretta Cool, BSN, RN, CWS, Kim RN, BSN Entered By: Gretta Cool, BSN, RN, CWS, Kim on 03/13/2018 09:53:58 Shellhammer, Pincus Sanes ([id number]) -------------------------------------------------------------------------------- Lower Extremity Assessment Details Patient Name: Mondo, Darl R. Date of Service: 03/13/2018 9:30 AM Medical Record Number: [id number] Patient Account Number: [id number] Date of Birth/Sex: 1932-08-07 (85 y.o. M) Treating RN: Montey Hora Primary Care Tita Terhaar: [name] Other Clinician: Referring Yasamin Karel: [name] Treating Aslan Himes/Extender: Melburn Hake, HOYT Weeks in Treatment: 1 Electronic Signature(s) Signed: 03/13/2018 5:07:23 PM By: Montey Hora Entered By: Montey Hora on 03/13/2018 09:25:17 Hank, Aristide RMarland Kitchen ([id number]) -------------------------------------------------------------------------------- Multi Wound Chart Details Patient Name: Sochacki, Teran R. Date of Service: 03/13/2018 9:30 AM Medical Record Number: [id number] Patient Account Number: [id number] Date of Birth/Sex: [birth year] (85 y.o. M) Treating RN: Harold Barban Primary Care Dawnyel Leven: [name] Other Clinician: Referring Janika Jedlicka: [name] Treating Hubbard Seldon/Extender: Melburn Hake, HOYT Weeks in Treatment: 1 Vital Signs Height(in): 69 Pulse(bpm): 54 Weight(lbs): 181 Blood Pressure(mmHg): 147/57 Body Mass Index(BMI): 27 Temperature(F): 97.6 Respiratory Rate 16 (breaths/min): Photos: [1:No Photos] [N/A:N/A] Wound Location: [1:Right Forearm] [N/A:N/A] Wounding Event: [1:Trauma] [N/A:N/A] Primary Etiology: [1:Trauma, Other] [N/A:N/A] Comorbid History: [1:Sleep Apnea, Coronary Artery N/A Disease, Hypertension, Type II Diabetes] Date Acquired: [1:03/01/2018]  [N/A:N/A] Weeks of Treatment: [1:1] [N/A:N/A] Wound Status: [1:Open] [N/A:N/A] Measurements L x W x D [1:4.2x3.1x0.2] [N/A:N/A] (cm) Area (cm) : [1:10.226] [N/A:N/A] Volume (cm) : [1:2.045] [N/A:N/A] % Reduction in Area: [1:25.60%] [N/A:N/A] % Reduction in Volume: [1:-48.80%] [N/A:N/A] Classification: [1:Full Thickness Without Exposed Support Structures] [N/A:N/A] Exudate Amount: [1:Large] [N/A:N/A] Exudate Type: [1:Serosanguineous] [N/A:N/A] Exudate Color: [1:red, brown] [N/A:N/A] Wound Margin: [1:Flat and Intact] [N/A:N/A] Granulation Amount: [1:Large (67-100%)] [N/A:N/A] Granulation Quality: [1:Pink] [N/A:N/A] Necrotic Amount: [1:Small (1-33%)] [N/A:N/A] Exposed Structures: [1:Fat Layer (Subcutaneous Tissue) Exposed: Yes Fascia: No Tendon: No Muscle: No Joint: No Bone: No] [N/A:N/A] Epithelialization: [1:None] [N/A:N/A] Periwound Skin Texture: [1:Excoriation:  Yes Induration: No Callus: No Crepitus: No] [N/A:N/A] Rash: No Scarring: No Periwound Skin Moisture: Maceration: No N/A N/A Dry/Scaly: No Periwound Skin Color: Hemosiderin Staining: Yes N/A N/A Atrophie Blanche: No Cyanosis: No Ecchymosis: No Erythema: No Mottled: No Pallor: No Rubor: No Temperature: No Abnormality N/A N/A Tenderness on Palpation: No N/A N/A Wound Preparation: Ulcer Cleansing: N/A N/A Rinsed/Irrigated with Saline Topical Anesthetic Applied: Other: lidocaine 4% Treatment Notes Electronic Signature(s) Signed: 03/13/2018 2:41:13 PM By: Harold Barban Entered By: Harold Barban on 03/13/2018 09:38:25 Sparacino, Pincus Sanes ([id number]) -------------------------------------------------------------------------------- Multi-Disciplinary Care Plan Details Patient Name: Vara, Alakai R. Date of Service: 03/13/2018 9:30 AM Medical Record Number: [id number] Patient Account Number: [id number] Date of Birth/Sex: 1932/07/12 (85 y.o. M) Treating RN: Harold Barban Primary Care Carmin Dibartolo: [name] Other  Clinician: Referring Jonatha Gagen: [name] Treating Calel Pisarski/Extender: Melburn Hake, HOYT Weeks in Treatment: 1 Active Inactive Abuse / Safety / Falls / Self Care Management Nursing Diagnoses: Potential for falls Goals: Patient will not experience any injury related to falls Date Initiated: 03/06/2018 Target Resolution Date: 06/07/2018 Goal Status: Active Interventions: Assess fall risk on admission and as needed Notes: Orientation to the Wound Care Program Nursing Diagnoses: Knowledge deficit related to the wound healing center program Goals: Patient/caregiver will verbalize understanding of the [facility] Date Initiated: 03/06/2018 Target Resolution Date: 06/07/2018 Goal Status: Active Interventions: Provide education on orientation to the wound center Notes: Wound/Skin Impairment Nursing Diagnoses: Impaired tissue integrity Goals: Ulcer/skin breakdown will have a volume reduction of 30% by week 4 Date Initiated: 03/06/2018 Target Resolution Date: 04/06/2018 Goal Status: Active Interventions: Assess patient/caregiver ability to obtain necessary supplies Keech, Hadyn R. ([id number]) Assess patient/caregiver ability to perform ulcer/skin care regimen upon admission and as needed Assess ulceration(s) every visit Notes: Electronic Signature(s) Signed: 03/13/2018 2:41:13 PM By: Harold Barban Entered By: Harold Barban on 03/13/2018 09:38:16 Quain, Tremell R. ([id number]) -------------------------------------------------------------------------------- Pain Assessment Details Patient Name: Sirek, Teondre R. Date of Service: 03/13/2018 9:30 AM Medical Record Number: [id number] Patient Account Number: [id number] Date of Birth/Sex: March 12, 1932 (85 y.o. M) Treating RN: Montey Hora Primary Care Jaxsen Bernhart: [name] Other Clinician: Referring Tylisa Alcivar: [name] Treating Vir Whetstine/Extender: Melburn Hake, HOYT Weeks in Treatment: 1 Active Problems Location of Pain  Severity and Description of Pain Patient Has Paino No Site Locations Pain Management and Medication Current Pain Management: Electronic Signature(s) Signed: 03/13/2018 5:07:23 PM By: Montey Hora Signed: 03/14/2018 3:31:07 PM By: Lorine Bears RCP, RRT, CHT Entered By: Lorine Bears on 03/13/2018 09:20:12 Arena, Pincus Sanes ([id number]) -------------------------------------------------------------------------------- Patient/Caregiver Education Details Patient Name: Shoff, Khylan R. Date of Service: 03/13/2018 9:30 AM Medical Record Number: [id number] Patient Account Number: [id number] Date of Birth/Gender: 14-Mar-1932 (85 y.o. M) Treating RN: Harold Barban Primary Care Physician: [name] Other Clinician: Referring Physician: [name] Treating Physician/Extender: Sharalyn Ink in Treatment: 1 Education Assessment Education Provided To: Patient Education Topics Provided Wound/Skin Impairment: Handouts: Caring for Your Ulcer Methods: Demonstration, Explain/Verbal Responses: State content correctly Electronic Signature(s) Signed: 03/14/2018 3:19:28 PM By: Gretta Cool, BSN, RN, CWS, Kim RN, BSN Entered By: Gretta Cool, BSN, RN, CWS, Kim on 03/13/2018 09:54:10 Hlavaty, Pincus Sanes ([id number]) -------------------------------------------------------------------------------- Wound Assessment Details Patient Name: Grewe, Jalien R. Date of Service: 03/13/2018 9:30 AM Medical Record Number: [id number] Patient Account Number: [id number] Date of Birth/Sex: 01/30/1933 (85 y.o. M) Treating RN: Montey Hora Primary Care Yaiza Palazzola: [name] Other Clinician: Referring Mileidy Atkin: [name] Treating Salil Raineri/Extender: STONE III, HOYT Weeks in Treatment: 1 Wound Status Wound Number: 1 Primary Trauma, Other Etiology:  Wound Location: Right Forearm Wound Open Wounding Event: Trauma Status: Date Acquired: 03/01/2018 Comorbid Sleep Apnea, Coronary Artery  Disease, Weeks Of Treatment: 1 History: Hypertension, Type II Diabetes Clustered Wound: No Photos Photo Uploaded By: Montey Hora on 03/13/2018 09:40:40 Wound Measurements Length: (cm) 4.2 Width: (cm) 3.1 Depth: (cm) 0.2 Area: (cm) 10.226 Volume: (cm) 2.045 % Reduction in Area: 25.6% % Reduction in Volume: -48.8% Epithelialization: None Tunneling: No Undermining: No Wound Description Full Thickness Without Exposed Support Classification: Structures Wound Margin: Flat and Intact Exudate Large Amount: Exudate Type: Serosanguineous Exudate Color: red, brown Foul Odor After Cleansing: No Slough/Fibrino Yes Wound Bed Granulation Amount: Large (67-100%) Exposed Structure Granulation Quality: Pink Fascia Exposed: No Necrotic Amount: Small (1-33%) Fat Layer (Subcutaneous Tissue) Exposed: Yes Necrotic Quality: Adherent Slough Tendon Exposed: No Muscle Exposed: No Joint Exposed: No Bone Exposed: No Nastasi, Loys R. ([id number]) Periwound Skin Texture Texture Color No Abnormalities Noted: No No Abnormalities Noted: No Callus: No Atrophie Blanche: No Crepitus: No Cyanosis: No Excoriation: Yes Ecchymosis: No Induration: No Erythema: No Rash: No Hemosiderin Staining: Yes Scarring: No Mottled: No Pallor: No Moisture Rubor: No No Abnormalities Noted: No Dry / Scaly: No Temperature / Pain Maceration: No Temperature: No Abnormality Wound Preparation Ulcer Cleansing: Rinsed/Irrigated with Saline Topical Anesthetic Applied: Other: lidocaine 4%, Treatment Notes Wound #1 (Right Forearm) Notes Hydrofera Blue, conform secured with coban Electronic Signature(s) Signed: 03/13/2018 5:07:23 PM By: Montey Hora Entered By: Montey Hora on 03/13/2018 09:32:00 Thoennes, Pincus Sanes ([id number]) -------------------------------------------------------------------------------- Vitals Details Patient Name: Bonk, Zaevion R. Date of Service: 03/13/2018 9:30 AM Medical Record  Number: [id number] Patient Account Number: [id number] Date of Birth/Sex: Jan 23, 1933 (85 y.o. M) Treating RN: Montey Hora Primary Care Beverley Sherrard: [name] Other Clinician: Referring Demeka Sutter: [name] Treating Summerlynn Glauser/Extender: Melburn Hake, HOYT Weeks in Treatment: 1 Vital Signs Time Taken: 09:20 Temperature (F): 97.6 Height (in): 69 Pulse (bpm): 57 Weight (lbs): 181 Respiratory Rate (breaths/min): 16 Body Mass Index (BMI): 26.7 Blood Pressure (mmHg): 147/57 Reference Range: 80 - 120 mg / dl Electronic Signature(s) Signed: 03/14/2018 3:31:07 PM By: Lorine Bears RCP, RRT, CHT Entered By: Becky Sax, Amado Nash on 03/13/2018 09:23:25

## 2018-03-20 ENCOUNTER — Encounter: Payer: PPO | Admitting: Physician Assistant

## 2018-03-20 DIAGNOSIS — L98492 Non-pressure chronic ulcer of skin of other sites with fat layer exposed: Secondary | ICD-10-CM | POA: Diagnosis not present

## 2018-03-20 DIAGNOSIS — S51851A Open bite of right forearm, initial encounter: Secondary | ICD-10-CM | POA: Diagnosis not present

## 2018-03-22 NOTE — Progress Notes (Signed)
Holst, NECHEMIA CHIAPPETTA ([id number]) Visit Report for 03/20/2018 Arrival Information Details Patient Name: [name], [name]. Date of Service: 03/20/2018 9:00 AM Medical Record Number: [id number] Patient Account Number: [id number] Date of Birth/Sex: 04-17-1932 (85 y.o. M) Treating RN: Harold Barban Primary Care Sruti Ayllon: [name] Other Clinician: Referring Davied Nocito: [name] Treating [place] Whedbee/Extender: Melburn Hake, HOYT Weeks in Treatment: 2 Visit Information History Since Last Visit Added or deleted any medications: No Patient Arrived: Ambulatory Any new allergies or adverse reactions: No Arrival Time: 09:09 Had a fall or experienced change in No Accompanied By: wife activities of daily living that may affect Transfer Assistance: None risk of falls: Patient Identification Verified: Yes Signs or symptoms of abuse/neglect since last visito No Secondary Verification Process Completed: Yes Hospitalized since last visit: No Implantable device outside of the clinic excluding No cellular tissue based products placed in the center since last visit: Has Dressing in Place as Prescribed: Yes Pain Present Now: No Electronic Signature(s) Signed: 03/20/2018 4:16:05 PM By: Lorine Bears RCP, RRT, CHT Entered By: Lorine Bears on 03/20/2018 09:10:28 Roy [name] ([id number]) -------------------------------------------------------------------------------- Clinic Level of Care Assessment Details Patient Name: [name], Roy [name]. Date of Service: 03/20/2018 9:00 AM Medical Record Number: [id number] Patient Account Number: [id number] Date of Birth/Sex: 07-Nov-1932 (85 y.o. M) Treating RN: Harold Barban Primary Care Zayden Maffei: [name] Other Clinician: Referring Anshi Jalloh: [name] Treating Ruey Storer/Extender: Melburn Hake, HOYT Weeks in Treatment: 2 Clinic Level of Care Assessment Items TOOL 4 Quantity Score []  - Use when only [name] EandM is performed on  FOLLOW-UP visit 0 ASSESSMENTS - Nursing Assessment / Reassessment X - Reassessment of Co-morbidities (includes updates in patient status) 1 10 X- 1 5 Reassessment of Adherence to Treatment Plan ASSESSMENTS - Wound and Skin Assessment / Reassessment X - Simple Wound Assessment / Reassessment - one wound 1 5 []  - 0 Complex Wound Assessment / Reassessment - multiple wounds []  - 0 Dermatologic / Skin Assessment (not related to wound area) ASSESSMENTS - Focused Assessment []  - Circumferential Edema Measurements - multi extremities 0 []  - 0 Nutritional Assessment / Counseling / Intervention []  - 0 Lower Extremity Assessment (monofilament, tuning fork, pulses) []  - 0 Peripheral Arterial Disease Assessment (using hand held doppler) ASSESSMENTS - Ostomy and/or Continence Assessment and Care []  - Incontinence Assessment and Management 0 []  - 0 Ostomy Care Assessment and Management (repouching, etc.) PROCESS - Coordination of Care X - Simple Patient / Family Education for ongoing care 1 15 []  - 0 Complex (extensive) Patient / Family Education for ongoing care X- 1 10 Staff obtains Programmer, systems, Records, Test Results / Process Orders []  - 0 Staff telephones HHA, Nursing Homes / Clarify orders / etc []  - 0 Routine Transfer to another Facility (non-emergent condition) []  - 0 Routine Hospital Admission (non-emergent condition) []  - 0 New Admissions / Biomedical engineer / Ordering NPWT, Apligraf, etc. []  - 0 Emergency Hospital Admission (emergent condition) X- 1 10 Simple Discharge Coordination Daise, Bayler [name]. ([id number]) []  - 0 Complex (extensive) Discharge Coordination PROCESS - Special Needs []  - Pediatric / Minor Patient Management 0 []  - 0 Isolation Patient Management []  - 0 Hearing / Language / Visual special needs []  - 0 Assessment of Community assistance (transportation, D/C planning, etc.) []  - 0 Additional assistance / Altered mentation []  - 0 Support Surface(s)  Assessment (bed, cushion, seat, etc.) INTERVENTIONS - Wound Cleansing / Measurement X - Simple Wound Cleansing - one wound 1 5 []  - 0 Complex Wound Cleansing - multiple wounds  X- 1 5 Wound Imaging (photographs - any number of wounds) []  - 0 Wound Tracing (instead of photographs) X- 1 5 Simple Wound Measurement - one wound []  - 0 Complex Wound Measurement - multiple wounds INTERVENTIONS - Wound Dressings X - Small Wound Dressing one or multiple wounds 1 10 []  - 0 Medium Wound Dressing one or multiple wounds []  - 0 Large Wound Dressing one or multiple wounds []  - 0 Application of Medications - topical []  - 0 Application of Medications - injection INTERVENTIONS - Miscellaneous []  - External ear exam 0 []  - 0 Specimen Collection (cultures, biopsies, blood, body fluids, etc.) []  - 0 Specimen(s) / Culture(s) sent or taken to Lab for analysis []  - 0 Patient Transfer (multiple staff / Civil Service fast streamer / Similar devices) []  - 0 Simple Staple / Suture removal (25 or less) []  - 0 Complex Staple / Suture removal (26 or more) []  - 0 Hypo / Hyperglycemic Management (close monitor of Blood Glucose) []  - 0 Ankle / Brachial Index (ABI) - do not check if billed separately X- 1 5 Vital Signs [name], Roy [name]. ([id number]) Has the patient been seen at the hospital within the last three years: Yes Total Score: 85 Level Of Care: New/Established - Level 3 Electronic Signature(s) Signed: 03/20/2018 4:21:01 PM By: Harold Barban Entered By: Harold Barban on 03/20/2018 09:31:03 Sia, Fahad RMarland Kitchen ([id number]) -------------------------------------------------------------------------------- Lower Extremity Assessment Details Patient Name: [name], Roy [name]. Date of Service: 03/20/2018 9:00 AM Medical Record Number: [id number] Patient Account Number: [id number] Date of Birth/Sex: 1932/08/10 (85 y.o. M) Treating RN: Montey Hora Primary Care Baley Lorimer: [name] Other Clinician: Referring Raife Lizer:  [name] Treating Sharnee Douglass/Extender: Melburn Hake, HOYT Weeks in Treatment: 2 Electronic Signature(s) Signed: 03/20/2018 4:56:33 PM By: Montey Hora Entered By: Montey Hora on 03/20/2018 09:20:12 Clanton, Traylen RMarland Kitchen ([id number]) -------------------------------------------------------------------------------- Multi Wound Chart Details Patient Name: [name], Roy [name]. Date of Service: 03/20/2018 9:00 AM Medical Record Number: [id number] Patient Account Number: [id number] Date of Birth/Sex: 01/17/1933 (85 y.o. M) Treating RN: Harold Barban Primary Care Quandarius Nill: [name] Other Clinician: Referring Handy Mcloud: [name] Treating Takara Sermons/Extender: Melburn Hake, HOYT Weeks in Treatment: 2 Vital Signs Height(in): 69 Pulse(bpm): 56 Weight(lbs): 181 Blood Pressure(mmHg): 135/53 Body Mass Index(BMI): 27 Temperature(F): 97.6 Respiratory Rate 16 (breaths/min): Photos: [N/A:N/A] Wound Location: Right Forearm N/A N/A Wounding Event: Trauma N/A N/A Primary Etiology: Trauma, Other N/A N/A Comorbid History: Sleep Apnea, Coronary Artery N/A N/A Disease, Hypertension, Type II Diabetes Date Acquired: 03/01/2018 N/A N/A Weeks of Treatment: 2 N/A N/A Wound Status: Open N/A N/A Measurements [name] x W x D 3.5x2.5x0.1 N/A N/A (cm) Area (cm) : 5.638 N/A N/A Volume (cm) : 0.687 N/A N/A % Reduction in Area: 50.00% N/A N/A % Reduction in Volume: 50.00% N/A N/A Classification: Full Thickness Without N/A N/A Exposed Support Structures Exudate Amount: Large N/A N/A Exudate Type: Serosanguineous N/A N/A Exudate Color: red, brown N/A N/A Wound Margin: Flat and Intact N/A N/A Granulation Amount: Large (67-100%) N/A N/A Granulation Quality: Pink N/A N/A Necrotic Amount: Small (1-33%) N/A N/A Exposed Structures: Fat Layer (Subcutaneous N/A N/A Tissue) Exposed: Yes Fascia: No Tendon: No Muscle: No Messman, Roy [name]. ([id number]) Joint: No Bone: No Epithelialization: None N/A  N/A Periwound Skin Texture: Excoriation: Yes N/A N/A Induration: No Callus: No Crepitus: No Rash: No Scarring: No Periwound Skin Moisture: Maceration: No N/A N/A Dry/Scaly: No Periwound Skin Color: Hemosiderin Staining: Yes N/A N/A Atrophie Blanche: No Cyanosis: No Ecchymosis: No Erythema: No Mottled: No Pallor: No Rubor: No Temperature:  No Abnormality N/A N/A Tenderness on Palpation: No N/A N/A Wound Preparation: Ulcer Cleansing: N/A N/A Rinsed/Irrigated with Saline Topical Anesthetic Applied: Other: lidocaine 4% Treatment Notes Electronic Signature(s) Signed: 03/20/2018 4:21:01 PM By: Harold Barban Entered By: Harold Barban on 03/20/2018 09:29:57 [name], Roy [name] ([id number]) -------------------------------------------------------------------------------- Multi-Disciplinary Care Plan Details Patient Name: Bora, Jarris [name]. Date of Service: 03/20/2018 9:00 AM Medical Record Number: [id number] Patient Account Number: [id number] Date of Birth/Sex: 10-28-1932 (85 y.o. M) Treating RN: Harold Barban Primary Care Reah Justo: [name] Other Clinician: Referring Jariana Shumard: [name] Treating Niyati Heinke/Extender: Melburn Hake, HOYT Weeks in Treatment: 2 Active Inactive Abuse / Safety / Falls / Self Care Management Nursing Diagnoses: Potential for falls Goals: Patient will not experience any injury related to falls Date Initiated: 03/06/2018 Target Resolution Date: 06/07/2018 Goal Status: Active Interventions: Assess fall risk on admission and as needed Notes: Orientation to the Wound Care Program Nursing Diagnoses: Knowledge deficit related to the wound healing center program Goals: Patient/caregiver will verbalize understanding of the [facility] Program Date Initiated: 03/06/2018 Target Resolution Date: 06/07/2018 Goal Status: Active Interventions: Provide education on orientation to the wound center Notes: Wound/Skin Impairment Nursing  Diagnoses: Impaired tissue integrity Goals: Ulcer/skin breakdown will have a volume reduction of 30% by week 4 Date Initiated: 03/06/2018 Target Resolution Date: 04/06/2018 Goal Status: Active Interventions: Assess patient/caregiver ability to obtain necessary supplies [name], Lean [name]. ([id number]) Assess patient/caregiver ability to perform ulcer/skin care regimen upon admission and as needed Assess ulceration(s) every visit Notes: Electronic Signature(s) Signed: 03/20/2018 4:21:01 PM By: Harold Barban Entered By: Harold Barban on 03/20/2018 09:29:43 Farooqui, Terre [name]. ([id number]) -------------------------------------------------------------------------------- Pain Assessment Details Patient Name: Nepomuceno, Ramie [name]. Date of Service: 03/20/2018 9:00 AM Medical Record Number: [id number] Patient Account Number: [id number] Date of Birth/Sex: [birth year] (85 y.o. M) Treating RN: Harold Barban Primary Care Braylyn Kalter: [name] Other Clinician: Referring Darcelle Herrada: [name] Treating Camilo Mander/Extender: Melburn Hake, HOYT Weeks in Treatment: 2 Active Problems Location of Pain Severity and Description of Pain Patient Has Paino No Site Locations Pain Management and Medication Current Pain Management: Electronic Signature(s) Signed: 03/20/2018 4:16:05 PM By: Lorine Bears RCP, RRT, CHT Signed: 03/20/2018 4:21:01 PM By: Harold Barban Entered By: Lorine Bears on 03/20/2018 09:10:36 [name], Roy [name] ([id number]) -------------------------------------------------------------------------------- Patient/Caregiver Education Details Patient Name: [name], Roy [name]. Date of Service: 03/20/2018 9:00 AM Medical Record Number: [id number] Patient Account Number: [id number] Date of Birth/Gender: Jun 26, 1932 (85 y.o. M) Treating RN: Harold Barban Primary Care Physician: [name] Other Clinician: Referring Physician: [name] Treating Physician/Extender: Sharalyn Ink in Treatment: 2 Education Assessment Education Provided To: Patient Education Topics Provided Wound/Skin Impairment: Handouts: Caring for Your Ulcer Methods: Demonstration, Explain/Verbal Responses: State content correctly Electronic Signature(s) Signed: 03/20/2018 4:21:01 PM By: Harold Barban Entered By: Harold Barban on 03/20/2018 09:32:07 [name], Roy [name]. ([id number]) -------------------------------------------------------------------------------- Wound Assessment Details Patient Name: [name], Roy [name]. Date of Service: 03/20/2018 9:00 AM Medical Record Number: [id number] Patient Account Number: [id number] Date of Birth/Sex: 1932-12-04 (85 y.o. M) Treating RN: Montey Hora Primary Care Jaben Benegas: [name] Other Clinician: Referring Kristilyn Coltrane: [name] Treating Annete Ayuso/Extender: Melburn Hake, HOYT Weeks in Treatment: 2 Wound Status Wound Number: 1 Primary Trauma, Other Etiology: Wound Location: Right Forearm Wound Open Wounding Event: Trauma Status: Date Acquired: 03/01/2018 Comorbid Sleep Apnea, Coronary Artery Disease, Weeks Of Treatment: 2 History: Hypertension, Type II Diabetes Clustered Wound: No Photos Photo Uploaded By: Montey Hora on 03/20/2018 09:26:02 Wound Measurements Length: (cm) 3.5 Width: (cm) 2.5 Depth: (cm) 0.1 Area: (cm)  6.872 Volume: (cm) 0.687 % Reduction in Area: 50% % Reduction in Volume: 50% Epithelialization: None Tunneling: No Undermining: No Wound Description Full Thickness Without Exposed Support Classification: Structures Wound Margin: Flat and Intact Exudate Large Amount: Exudate Type: Serosanguineous Exudate Color: red, brown Foul Odor After Cleansing: No Slough/Fibrino Yes Wound Bed Granulation Amount: Large (67-100%) Exposed Structure Granulation Quality: Pink Fascia Exposed: No Necrotic Amount: Small (1-33%) Fat Layer (Subcutaneous Tissue) Exposed: Yes Necrotic Quality: Adherent  Slough Tendon Exposed: No Muscle Exposed: No Joint Exposed: No Bone Exposed: No [name], Roy [name]. ([id number]) Periwound Skin Texture Texture Color No Abnormalities Noted: No No Abnormalities Noted: No Callus: No Atrophie Blanche: No Crepitus: No Cyanosis: No Excoriation: Yes Ecchymosis: No Induration: No Erythema: No Rash: No Hemosiderin Staining: Yes Scarring: No Mottled: No Pallor: No Moisture Rubor: No No Abnormalities Noted: No Dry / Scaly: No Temperature / Pain Maceration: No Temperature: No Abnormality Wound Preparation Ulcer Cleansing: Rinsed/Irrigated with Saline Topical Anesthetic Applied: Other: lidocaine 4%, Electronic Signature(s) Signed: 03/20/2018 4:56:33 PM By: Montey Hora Entered By: Montey Hora on 03/20/2018 09:20:03 Swartzlander, Roy [name] ([id number]) -------------------------------------------------------------------------------- Vitals Details Patient Name: [name], Roy [name]. Date of Service: 03/20/2018 9:00 AM Medical Record Number: [id number] Patient Account Number: [id number] Date of Birth/Sex: 1932-12-26 (85 y.o. M) Treating RN: Harold Barban Primary Care Kartik Fernando: [name] Other Clinician: Referring Samik Balkcom: [name] Treating Erick Murin/Extender: Melburn Hake, HOYT Weeks in Treatment: 2 Vital Signs Time Taken: 09:10 Temperature (F): 97.6 Height (in): 69 Pulse (bpm): 56 Weight (lbs): 181 Respiratory Rate (breaths/min): 16 Body Mass Index (BMI): 26.7 Blood Pressure (mmHg): 135/53 Reference Range: 80 - 120 mg / dl Electronic Signature(s) Signed: 03/20/2018 4:16:05 PM By: Lorine Bears RCP, RRT, CHT Entered By: Lorine Bears on 03/20/2018 09:13:49

## 2018-03-22 NOTE — Progress Notes (Signed)
Finazzo, SUM[place] ARRO[place]AVE ([id number]) Visit Report for 03/20/2018 Chief Complaint Document Details Patient Name: Roy [name], [name] Date of Service: 03/20/2018 9:00 AM Medical Record Number: [id number] Patient Account Number: [id number] Date of Birth/Sex: [birth year] (85 y.o. M) Treating RN: Harold Barban Primary Care Provider: [name] Other Clinician: Referring Provider: [name] Treating Provider/Extender: Melburn Hake, HOYT Weeks in Treatment: 2 Information Obtained from: Patient Chief Complaint Donkey bite/Trauma right forearm Electronic Signature(s) Signed: 03/20/2018 5:12:14 PM By: Worthy Keeler PA-C Entered By: Worthy Keeler on 03/20/2018 09:42:05 Clay, Montrey R. ([id number]) -------------------------------------------------------------------------------- HPI Details Patient Name: Ausmus, [name] Date of Service: 03/20/2018 9:00 AM Medical Record Number: [id number] Patient Account Number: [id number] Date of Birth/Sex: [birth year] (85 y.o. M) Treating RN: Harold Barban Primary Care Provider: [name] Other Clinician: Referring Provider: [name] Treating Provider/Extender: Melburn Hake, HOYT Weeks in Treatment: 2 History of Present Illness HPI Description: 03/06/18 on evaluation today patient appears to be doing okay upon initial evaluation here in our clinic. He does have a skin tear which occurred on the right form which was a result of having been bitten by his daughter's donkey. He states that the animal is normally very mild tempered and in fact was not even mad when this occurred he had an [place] and was attempting to feed the donkey the donkey missed and biting for the [place] and got his arm through his coat. It did not pierce the coat but nonetheless just the pressure of the bite itself because the skin breakdown. Nonetheless he did have a significant skin tear which required that he go to the hospital at the emergency department on Saturday, March 01, 2018. Subsequently he was given clindamycin 300 mg four times a day. He states that his the does have diabetes and is A1c is around 5.1 at the last check. No fevers chills noted he does tell me that they had a difficult time getting his breathing under control at the emergency department when he was there. Fortunately this does not seem to be waiting separately this point although it is going to require some debridement bleeding may become an issue. Otherwise the patient has no other major medical problems and aspirin is the only blood thinner that he is on at this point. 03/13/18 on evaluation today patient's arm appears to be doing significantly better which is good news. He's been tolerating the dressing changes without complication. Fortunately there is no sign of infection. No fevers, chills, nausea, or vomiting noted at this time. 03/20/18 on evaluation today patient's arm ulcer actually appears to be doing excellent at this time. This has a great granulation surface and has done extremely well with the [facility] Dressing for the last week in my pinion. There is no sign of infection. No fevers, chills, nausea, or vomiting noted at this time. Electronic Signature(s) Signed: 03/20/2018 5:12:14 PM By: Worthy Keeler PA-C Entered By: Worthy Keeler on 03/20/2018 09:42:19 Blankenbaker, Pincus Sanes ([id number]) -------------------------------------------------------------------------------- Physical Exam Details Patient Name: Hoppe, [name] Date of Service: 03/20/2018 9:00 AM Medical Record Number: [id number] Patient Account Number: [id number] Date of Birth/Sex: 01-15-1933 (85 y.o. M) Treating RN: Harold Barban Primary Care Provider: [name] Other Clinician: Referring Provider: [name] Treating Provider/Extender: STONE III, HOYT Weeks in Treatment: 2 Constitutional Well-nourished and well-hydrated in no acute distress. Respiratory normal breathing without difficulty. clear to  auscultation bilaterally. Cardiovascular regular rate and rhythm with normal S1, S2. Psychiatric this patient is able to make decisions and demonstrates good  insight into disease process. Alert and Oriented x 3. pleasant and cooperative. Notes Patient's wound bed currently shows an excellent granulation surface at this time there is no sign of active infection at this point which is great news. Overall very pleased with the appearance today. Electronic Signature(s) Signed: 03/20/2018 5:12:14 PM By: Worthy Keeler PA-C Entered By: Worthy Keeler on 03/20/2018 09:42:57 Riles, Pincus Sanes ([id number]) -------------------------------------------------------------------------------- Physician Orders Details Patient Name: Duplechain, [name] Date of Service: 03/20/2018 9:00 AM Medical Record Number: [id number] Patient Account Number: [id number] Date of Birth/Sex: September 29, 1932 (85 y.o. M) Treating RN: Harold Barban Primary Care Provider: [name] Other Clinician: Referring Provider: [name] Treating Provider/Extender: Melburn Hake, HOYT Weeks in Treatment: 2 Verbal / Phone Orders: No Diagnosis Coding Wound Cleansing Wound #1 Right Forearm o Cleanse wound with mild soap and water - Clean wound with Dial antibacterial soap (pump), gently pat to dry. o May Shower, gently pat wound dry prior to applying new dressing. Primary Wound Dressing Wound #1 Right Forearm o Hydrafera Blue Ready Transfer Secondary Dressing Wound #1 Right Forearm o Dry Gauze o Conform/Kerlix o Coban Dressing Change Frequency Wound #1 Right Forearm o Change dressing every other day. Follow-up Appointments Wound #1 Right Forearm o Return Appointment in 1 week. Electronic Signature(s) Signed: 03/20/2018 4:21:01 PM By: Harold Barban Signed: 03/20/2018 5:12:14 PM By: Worthy Keeler PA-C Entered By: Harold Barban on 03/20/2018 09:31:38 Zipper, Pincus Sanes  ([id number]) -------------------------------------------------------------------------------- Problem List Details Patient Name: Bozich, [name] Date of Service: 03/20/2018 9:00 AM Medical Record Number: [id number] Patient Account Number: [id number] Date of Birth/Sex: Oct 29, 1932 (85 y.o. M) Treating RN: Harold Barban Primary Care Provider: [name] Other Clinician: Referring Provider: [name] Treating Provider/Extender: Melburn Hake, HOYT Weeks in Treatment: 2 Active Problems ICD-10 Evaluated Encounter Code Description Active Date Today Diagnosis W55.81XA Bitten by other mammals, initial encounter 03/06/2018 No Yes S51.801A Unspecified open wound of right forearm, initial encounter 03/06/2018 No Yes E11.622 Type 2 diabetes mellitus with other skin ulcer 03/06/2018 No Yes Inactive Problems Resolved Problems Electronic Signature(s) Signed: 03/20/2018 5:12:14 PM By: Worthy Keeler PA-C Entered By: Worthy Keeler on 03/20/2018 09:42:00 Alvelo, Jakyren R. ([id number]) -------------------------------------------------------------------------------- Progress Note Details Patient Name: Queener, Dayln R. Date of Service: 03/20/2018 9:00 AM Medical Record Number: [id number] Patient Account Number: [id number] Date of Birth/Sex: September 14, 1932 (85 y.o. M) Treating RN: Harold Barban Primary Care Provider: [name] Other Clinician: Referring Provider: [name] Treating Provider/Extender: Melburn Hake, HOYT Weeks in Treatment: 2 Subjective Chief Complaint Information obtained from Patient Pollie Friar bite/Trauma right forearm History of Present Illness (HPI) 03/06/18 on evaluation today patient appears to be doing okay upon initial evaluation here in our clinic. He does have a skin tear which occurred on the right form which was a result of having been bitten by his daughter's donkey. He states that the animal is normally very mild tempered and in fact was not even mad when this occurred  he had an [place] and was attempting to feed the donkey the donkey missed and biting for the [place] and got his arm through his coat. It did not pierce the coat but nonetheless just the pressure of the bite itself because the skin breakdown. Nonetheless he did have a significant skin tear which required that he go to the hospital at the emergency department on Saturday, March 01, 2018. Subsequently he was given clindamycin 300 mg four times a day. He states that his the does have diabetes and is A1c is around  5.1 at the last check. No fevers chills noted he does tell me that they had a difficult time getting his breathing under control at the emergency department when he was there. Fortunately this does not seem to be waiting separately this point although it is going to require some debridement bleeding may become an issue. Otherwise the patient has no other major medical problems and aspirin is the only blood thinner that he is on at this point. 03/13/18 on evaluation today patient's arm appears to be doing significantly better which is good news. He's been tolerating the dressing changes without complication. Fortunately there is no sign of infection. No fevers, chills, nausea, or vomiting noted at this time. 03/20/18 on evaluation today patient's arm ulcer actually appears to be doing excellent at this time. This has a great granulation surface and has done extremely well with the [facility] Dressing for the last week in my pinion. There is no sign of infection. No fevers, chills, nausea, or vomiting noted at this time. Patient History Information obtained from Patient. Family History Cancer - Siblings, Diabetes - Siblings, Heart Disease - Siblings, Kidney Disease - Siblings, No family history of Hereditary Spherocytosis, Hypertension, Lung Disease, Seizures, Stroke, Thyroid Problems, Tuberculosis. Social History Former smoker - ended on 03/05/1958, Marital Status - Married, Alcohol Use -  Never, Drug Use - No History, Caffeine Use - Daily. Medical And Surgical History Notes Hematologic/Lymphatic Thrombocytopenia Review of Systems (ROS) Constitutional Symptoms (General Health) Denies complaints or symptoms of Fever, Chills. Respiratory Profitt, Chayim R. ([id number]) The patient has no complaints or symptoms. Cardiovascular The patient has no complaints or symptoms. Psychiatric The patient has no complaints or symptoms. Objective Constitutional Well-nourished and well-hydrated in no acute distress. Vitals Time Taken: 9:10 AM, Height: 69 in, Weight: 181 lbs, BMI: 26.7, Temperature: 97.6 F, Pulse: 56 bpm, Respiratory Rate: 16 breaths/min, Blood Pressure: 135/53 mmHg. Respiratory normal breathing without difficulty. clear to auscultation bilaterally. Cardiovascular regular rate and rhythm with normal S1, S2. Psychiatric this patient is able to make decisions and demonstrates good insight into disease process. Alert and Oriented x 3. pleasant and cooperative. General Notes: Patient's wound bed currently shows an excellent granulation surface at this time there is no sign of active infection at this point which is great news. Overall very pleased with the appearance today. Integumentary (Hair, Skin) Wound #1 status is Open. Original cause of wound was Trauma. The wound is located on the Right Forearm. The wound measures 3.5cm length x 2.5cm width x 0.1cm depth; 6.872cm^2 area and 0.687cm^3 volume. There is Fat Layer (Subcutaneous Tissue) Exposed exposed. There is no tunneling or undermining noted. There is a large amount of serosanguineous drainage noted. The wound margin is flat and intact. There is large (67-100%) pink granulation within the wound bed. There is a small (1-33%) amount of necrotic tissue within the wound bed including Adherent Slough. The periwound skin appearance exhibited: Excoriation, Hemosiderin Staining. The periwound skin appearance did not  exhibit: Callus, Crepitus, Induration, Rash, Scarring, Dry/Scaly, Maceration, Atrophie Blanche, Cyanosis, Ecchymosis, Mottled, Pallor, Rubor, Erythema. Periwound temperature was noted as No Abnormality. Assessment Active Problems ICD-10 Bitten by other mammals, initial encounter Unspecified open wound of right forearm, initial encounter Type 2 diabetes mellitus with other skin ulcer Uselman, Talis R. ([id number]) Plan Wound Cleansing: Wound #1 Right Forearm: Cleanse wound with mild soap and water - Clean wound with Dial antibacterial soap (pump), gently pat to dry. May Shower, gently pat wound dry prior to applying new dressing. Primary  Wound Dressing: Wound #1 Right Forearm: Hydrafera Blue Ready Transfer Secondary Dressing: Wound #1 Right Forearm: Dry Gauze Conform/Kerlix Coban Dressing Change Frequency: Wound #1 Right Forearm: Change dressing every other day. Follow-up Appointments: Wound #1 Right Forearm: Return Appointment in 1 week. My suggestion at this point is going to be that we go ahead and continue with the above wound care measures for the next week. If anything changes or worsens in the meantime patient will contact the office and let me know otherwise my hope is he will continue to show signs of good improvement as we progress over the next several weeks. Please see above for specific wound care orders. We will see patient for re-evaluation in 1 week(s) here in the clinic. If anything worsens or changes patient will contact our office for additional recommendations. Electronic Signature(s) Signed: 03/20/2018 5:12:14 PM By: Worthy Keeler PA-C Entered By: Worthy Keeler on 03/20/2018 09:43:34 Sheek, Pincus Sanes ([id number]) -------------------------------------------------------------------------------- ROS/PFSH Details Patient Name: Mehrer, Jakeem R. Date of Service: 03/20/2018 9:00 AM Medical Record Number: [id number] Patient Account Number: [id number] Date of  Birth/Sex: 1932/08/02 (85 y.o. M) Treating RN: Harold Barban Primary Care Provider: [name] Other Clinician: Referring Provider: [name] Treating Provider/Extender: Melburn Hake, HOYT Weeks in Treatment: 2 Information Obtained From Patient Wound History Do you currently have one or more open woundso Yes How many open wounds do you currently haveo 1 Approximately how long have you had your woundso March 01, 2018 How have you been treating your wound(s) until nowo coban, soap and water Has your wound(s) ever healed and then re-openedo No Have you had any lab work done in the past montho No Have you tested positive for an antibiotic resistant organism (MRSA, VRE)o No Have you tested positive for osteomyelitis (bone infection)o No Have you had any tests for circulation on your legso No Have you had other problems associated with your woundso Swelling Constitutional Symptoms (General Health) Complaints and Symptoms: Negative for: Fever; Chills Hematologic/Lymphatic Medical History: Past Medical History Notes: Thrombocytopenia Respiratory Complaints and Symptoms: No Complaints or Symptoms Medical History: Positive for: Sleep Apnea Cardiovascular Complaints and Symptoms: No Complaints or Symptoms Medical History: Positive for: Coronary Artery Disease; Hypertension Endocrine Medical History: Positive for: Type II Diabetes Time with diabetes: 8 years Justice, Trypp R. ([id number]) Psychiatric Complaints and Symptoms: No Complaints or Symptoms Immunizations Pneumococcal Vaccine: Received Pneumococcal Vaccination: Yes Implantable Devices Family and Social History Cancer: Yes - Siblings; Diabetes: Yes - Siblings; Heart Disease: Yes - Siblings; Hereditary Spherocytosis: No; Hypertension: No; Kidney Disease: Yes - Siblings; Lung Disease: No; Seizures: No; Stroke: No; Thyroid Problems: No; Tuberculosis: No; Former smoker - ended on 03/05/1958; Marital Status - Married;  Alcohol Use: Never; Drug Use: No History; Caffeine Use: Daily; Financial Concerns: No; Food, Clothing or Shelter Needs: No; Support System Lacking: No; Transportation Concerns: No; Advanced Directives: No; Patient does not want information on Advanced Directives; Living Will: No; Medical Power of Attorney: No Physician Affirmation I have reviewed and agree with the above information. Electronic Signature(s) Signed: 03/20/2018 4:21:01 PM By: Harold Barban Signed: 03/20/2018 5:12:14 PM By: Worthy Keeler PA-C Entered By: Worthy Keeler on 03/20/2018 09:42:40 Kosar, Sloane R. ([id number]) -------------------------------------------------------------------------------- SuperBill Details Patient Name: Johndrow, Khyler R. Date of Service: 03/20/2018 Medical Record Number: [id number] Patient Account Number: [id number] Date of Birth/Sex: 07/03/1932 (85 y.o. M) Treating RN: Harold Barban Primary Care Provider: [name] Other Clinician: Referring Provider: [name] Treating Provider/Extender: STONE III, HOYT Weeks in Treatment: 2 Diagnosis  Coding ICD-10 Codes Code Description W55.81XA Bitten by other mammals, initial encounter S51.801A Unspecified open wound of right forearm, initial encounter E11.622 Type 2 diabetes mellitus with other skin ulcer Facility Procedures CPT4 Code: [id number] Description: 99213 - WOUND CARE VISIT-LEV 3 EST PT Modifier: Quantity: 1 Physician Procedures CPT4 Code: 3736681 Description: 59470 - WC PHYS LEVEL 4 - EST PT ICD-10 Diagnosis Description W55.81XA Bitten by other mammals, initial encounter S51.801A Unspecified open wound of right forearm, initial encounte E11.622 Type 2 diabetes mellitus with other skin ulcer Modifier: r Quantity: 1 Electronic Signature(s) Signed: 03/20/2018 5:12:14 PM By: Worthy Keeler PA-C Entered By: Worthy Keeler on 03/20/2018 09:43:47

## 2018-03-27 ENCOUNTER — Encounter: Payer: PPO | Admitting: Physician Assistant

## 2018-03-27 DIAGNOSIS — L98492 Non-pressure chronic ulcer of skin of other sites with fat layer exposed: Secondary | ICD-10-CM | POA: Diagnosis not present

## 2018-03-27 DIAGNOSIS — S51851A Open bite of right forearm, initial encounter: Secondary | ICD-10-CM | POA: Diagnosis not present

## 2018-03-30 NOTE — Progress Notes (Signed)
Sharpnack, DAYMEN HASSEBROCK ([id number]) Visit Report for 03/27/2018 Arrival Information Details Patient Name: Roy [name], Roy [name]. Date of Service: 03/27/2018 9:15 AM Medical Record Number: [id number] Patient Account Number: [id number] Date of Birth/Sex: [birth year] (85 y.o. M) Treating RN: Cornell Barman Primary Care Liliani Bobo: [name] Other Clinician: Referring Fionna Merriott: [name] Treating Esther Bradstreet/Extender: Melburn Hake, HOYT Weeks in Treatment: 3 Visit Information History Since Last Visit Added or deleted any medications: No Patient Arrived: Ambulatory Any new allergies or adverse reactions: No Arrival Time: 09:06 Had a fall or experienced change in No Accompanied By: wife activities of daily living that may affect Transfer Assistance: None risk of falls: Patient Identification Verified: Yes Signs or symptoms of abuse/neglect since last visito No Secondary Verification Process Completed: Yes Hospitalized since last visit: No Implantable device outside of the clinic excluding No cellular tissue based products placed in the center since last visit: Has Dressing in Place as Prescribed: Yes Pain Present Now: No Electronic Signature(s) Signed: 03/27/2018 11:33:04 AM By: Lorine Bears RCP, RRT, CHT Entered By: Lorine Bears on 03/27/2018 09:06:41 Weldon, Roy [name] ([id number]) -------------------------------------------------------------------------------- Clinic Level of Care Assessment Details Patient Name: Roy [name], [name] Date of Service: 03/27/2018 9:15 AM Medical Record Number: [id number] Patient Account Number: [id number] Date of Birth/Sex: 1932-09-07 (85 y.o. M) Treating RN: Cornell Barman Primary Care Boone Gear: [name] Other Clinician: Referring Johnmatthew Solorio: [name] Treating Deanie Jupiter/Extender: Melburn Hake, HOYT Weeks in Treatment: 3 Clinic Level of Care Assessment Items TOOL 4 Quantity Score []  - Use when only an EandM is performed on FOLLOW-UP  visit 0 ASSESSMENTS - Nursing Assessment / Reassessment []  - Reassessment of Co-morbidities (includes updates in patient status) 0 X- 1 5 Reassessment of Adherence to Treatment Plan ASSESSMENTS - Wound and Skin Assessment / Reassessment X - Simple Wound Assessment / Reassessment - one wound 1 5 []  - 0 Complex Wound Assessment / Reassessment - multiple wounds []  - 0 Dermatologic / Skin Assessment (not related to wound area) ASSESSMENTS - Focused Assessment []  - Circumferential Edema Measurements - multi extremities 0 []  - 0 Nutritional Assessment / Counseling / Intervention []  - 0 Lower Extremity Assessment (monofilament, tuning fork, pulses) []  - 0 Peripheral Arterial Disease Assessment (using hand held doppler) ASSESSMENTS - Ostomy and/or Continence Assessment and Care []  - Incontinence Assessment and Management 0 []  - 0 Ostomy Care Assessment and Management (repouching, etc.) PROCESS - Coordination of Care X - Simple Patient / Family Education for ongoing care 1 15 []  - 0 Complex (extensive) Patient / Family Education for ongoing care []  - 0 Staff obtains Programmer, systems, Records, Test Results / Process Orders []  - 0 Staff telephones HHA, Nursing Homes / Clarify orders / etc []  - 0 Routine Transfer to another Facility (non-emergent condition) []  - 0 Routine Hospital Admission (non-emergent condition) []  - 0 New Admissions / Biomedical engineer / Ordering NPWT, Apligraf, etc. []  - 0 Emergency Hospital Admission (emergent condition) X- 1 10 Simple Discharge Coordination Borum, Angelo R. ([id number]) []  - 0 Complex (extensive) Discharge Coordination PROCESS - Special Needs []  - Pediatric / Minor Patient Management 0 []  - 0 Isolation Patient Management []  - 0 Hearing / Language / Visual special needs []  - 0 Assessment of Community assistance (transportation, D/C planning, etc.) []  - 0 Additional assistance / Altered mentation []  - 0 Support Surface(s) Assessment (bed,  cushion, seat, etc.) INTERVENTIONS - Wound Cleansing / Measurement X - Simple Wound Cleansing - one wound 1 5 []  - 0 Complex Wound Cleansing - multiple wounds X-  1 5 Wound Imaging (photographs - any number of wounds) []  - 0 Wound Tracing (instead of photographs) X- 1 5 Simple Wound Measurement - one wound []  - 0 Complex Wound Measurement - multiple wounds INTERVENTIONS - Wound Dressings []  - Small Wound Dressing one or multiple wounds 0 X- 1 15 Medium Wound Dressing one or multiple wounds []  - 0 Large Wound Dressing one or multiple wounds []  - 0 Application of Medications - topical []  - 0 Application of Medications - injection INTERVENTIONS - Miscellaneous []  - External ear exam 0 []  - 0 Specimen Collection (cultures, biopsies, blood, body fluids, etc.) []  - 0 Specimen(s) / Culture(s) sent or taken to Lab for analysis []  - 0 Patient Transfer (multiple staff / Civil Service fast streamer / Similar devices) []  - 0 Simple Staple / Suture removal (25 or less) []  - 0 Complex Staple / Suture removal (26 or more) []  - 0 Hypo / Hyperglycemic Management (close monitor of Blood Glucose) []  - 0 Ankle / Brachial Index (ABI) - do not check if billed separately X- 1 5 Vital Signs Roy [name], Roy [name] R. ([id number]) Has the patient been seen at the hospital within the last three years: Yes Total Score: 70 Level Of Care: New/Established - Level 2 Electronic Signature(s) Signed: 03/27/2018 5:43:44 PM By: Gretta Cool, BSN, RN, CWS, Kim RN, BSN Entered By: Gretta Cool, BSN, RN, CWS, Kim on 03/27/2018 12:37:47 Venturi, Roy [name] ([id number]) -------------------------------------------------------------------------------- Lower Extremity Assessment Details Patient Name: Roy [name], [name] Date of Service: 03/27/2018 9:15 AM Medical Record Number: [id number] Patient Account Number: [id number] Date of Birth/Sex: [birth year] (85 y.o. M) Treating RN: Secundino Ginger Primary Care Lashala Laser: [name] Other Clinician: Referring  Braden Cimo: [name] Treating Aijalon Kirtz/Extender: Melburn Hake, HOYT Weeks in Treatment: 3 Electronic Signature(s) Signed: 03/27/2018 4:33:03 PM By: Secundino Ginger Entered By: Secundino Ginger on 03/27/2018 09:14:05 Long[place]cker, [place]. ([id number]) -------------------------------------------------------------------------------- Multi Wound Chart Details Patient Name: Roy [name], [name] Date of Service: 03/27/2018 9:15 AM Medical Record Number: [id number] Patient Account Number: [id number] Date of Birth/Sex: 1932/04/27 (85 y.o. M) Treating RN: Cornell Barman Primary Care Ryan Ogborn: [name] Other Clinician: Referring Tricia Pledger: [name] Treating Jennings Corado/Extender: Melburn Hake, HOYT Weeks in Treatment: 3 Vital Signs Height(in): 69 Pulse(bpm): 58 Weight(lbs): 181 Blood Pressure(mmHg): 169/68 Body Mass Index(BMI): 27 Temperature(F): 97.5 Respiratory Rate 16 (breaths/min): Photos: [N/A:N/A] Wound Location: Right Forearm N/A N/A Wounding Event: Trauma N/A N/A Primary Etiology: Trauma, Other N/A N/A Comorbid History: Sleep Apnea, Coronary Artery N/A N/A Disease, Hypertension, Type II Diabetes Date Acquired: 03/01/2018 N/A N/A Weeks of Treatment: 3 N/A N/A Wound Status: Open N/A N/A Measurements L x W x D 3.5x2.3x0.1 N/A N/A (cm) Area (cm) : 6.322 N/A N/A Volume (cm) : 0.632 N/A N/A % Reduction in Area: 54.00% N/A N/A % Reduction in Volume: 54.00% N/A N/A Classification: Full Thickness Without N/A N/A Exposed Support Structures Exudate Amount: Large N/A N/A Exudate Type: Serosanguineous N/A N/A Exudate Color: red, brown N/A N/A Wound Margin: Flat and Intact N/A N/A Granulation Amount: Small (1-33%) N/A N/A Granulation Quality: Red, Pink N/A N/A Necrotic Amount: Small (1-33%) N/A N/A Exposed Structures: Fat Layer (Subcutaneous N/A N/A Tissue) Exposed: Yes Fascia: No Tendon: No Muscle: No Schwegler, Clerence R. ([id number]) Joint: No Bone: No Epithelialization: None N/A N/A Periwound  Skin Texture: Excoriation: Yes N/A N/A Induration: No Callus: No Crepitus: No Rash: No Scarring: No Periwound Skin Moisture: Maceration: No N/A N/A Dry/Scaly: No Periwound Skin Color: Hemosiderin Staining: Yes N/A N/A Atrophie Blanche: No Cyanosis: No Ecchymosis: No Erythema: No  Mottled: No Pallor: No Rubor: No Temperature: No Abnormality N/A N/A Tenderness on Palpation: No N/A N/A Wound Preparation: Ulcer Cleansing: N/A N/A Rinsed/Irrigated with Saline Topical Anesthetic Applied: Other: lidocaine 4% Treatment Notes Electronic Signature(s) Signed: 03/27/2018 5:43:44 PM By: Gretta Cool, BSN, RN, CWS, Kim RN, BSN Entered By: Gretta Cool, BSN, RN, CWS, Kim on 03/27/2018 10:03:56 Rack, Roy [name] ([id number]) -------------------------------------------------------------------------------- Multi-Disciplinary Care Plan Details Patient Name: Roy [name], [name] Date of Service: 03/27/2018 9:15 AM Medical Record Number: [id number] Patient Account Number: [id number] Date of Birth/Sex: 03-09-1932 (85 y.o. M) Treating RN: Cornell Barman Primary Care Aeriel Boulay: [name] Other Clinician: Referring Kerri Kovacik: [name] Treating Brocha Gilliam/Extender: Melburn Hake, HOYT Weeks in Treatment: 3 Active Inactive Abuse / Safety / Falls / Self Care Management Nursing Diagnoses: Potential for falls Goals: Patient will not experience any injury related to falls Date Initiated: 03/06/2018 Target Resolution Date: 06/07/2018 Goal Status: Active Interventions: Assess fall risk on admission and as needed Notes: Orientation to the Wound Care Program Nursing Diagnoses: Knowledge deficit related to the wound healing center program Goals: Patient/caregiver will verbalize understanding of the [facility] Date Initiated: 03/06/2018 Target Resolution Date: 06/07/2018 Goal Status: Active Interventions: Provide education on orientation to the wound center Notes: Wound/Skin Impairment Nursing  Diagnoses: Impaired tissue integrity Goals: Ulcer/skin breakdown will have a volume reduction of 30% by week 4 Date Initiated: 03/06/2018 Target Resolution Date: 04/06/2018 Goal Status: Active Interventions: Assess patient/caregiver ability to obtain necessary supplies Dauphine, Nickey R. ([id number]) Assess patient/caregiver ability to perform ulcer/skin care regimen upon admission and as needed Assess ulceration(s) every visit Notes: Electronic Signature(s) Signed: 03/27/2018 5:43:44 PM By: Gretta Cool, BSN, RN, CWS, Kim RN, BSN Entered By: Gretta Cool, BSN, RN, CWS, Kim on 03/27/2018 10:03:33 Kissel, Roy [name] ([id number]) -------------------------------------------------------------------------------- Pain Assessment Details Patient Name: Roy [name], [name] Date of Service: 03/27/2018 9:15 AM Medical Record Number: [id number] Patient Account Number: [id number] Date of Birth/Sex: 1932/06/16 (85 y.o. M) Treating RN: Cornell Barman Primary Care Rosanne Wohlfarth: [name] Other Clinician: Referring Reveca Desmarais: [name] Treating Commodore Bellew/Extender: Melburn Hake, HOYT Weeks in Treatment: 3 Active Problems Location of Pain Severity and Description of Pain Patient Has Paino No Site Locations Pain Management and Medication Current Pain Management: Electronic Signature(s) Signed: 03/27/2018 11:33:04 AM By: Paulla Fore, RRT, CHT Signed: 03/27/2018 5:43:44 PM By: Gretta Cool, BSN, RN, CWS, Kim RN, BSN Entered By: Lorine Bears on 03/27/2018 09:06:47 Lindenberger, Roy [name] ([id number]) -------------------------------------------------------------------------------- Patient/Caregiver Education Details Patient Name: Roy [name], [name] Date of Service: 03/27/2018 9:15 AM Medical Record Number: [id number] Patient Account Number: [id number] Date of Birth/Gender: [birth year] (85 y.o. M) Treating RN: Cornell Barman Primary Care Physician: [name] Other Clinician: Referring Physician: [name] Treating Physician/Extender: Sharalyn Ink in Treatment: 3 Education Assessment Education Provided To: Patient Education Topics Provided Wound/Skin Impairment: Handouts: Caring for Your Ulcer, Other: continue wound care Methods: Demonstration, Explain/Verbal Responses: State content correctly Electronic Signature(s) Signed: 03/27/2018 5:43:44 PM By: Gretta Cool, BSN, RN, CWS, Kim RN, BSN Entered By: Gretta Cool, BSN, RN, CWS, Kim on 03/27/2018 10:08:27 Roy [name], Roy [name] ([id number]) -------------------------------------------------------------------------------- Wound Assessment Details Patient Name: Roy [name], [name] Date of Service: 03/27/2018 9:15 AM Medical Record Number: [id number] Patient Account Number: [id number] Date of Birth/Sex: 11-16-1932 (85 y.o. M) Treating RN: Secundino Ginger Primary Care Ned Kakar: [name] Other Clinician: Referring Sreekar Broyhill: [name] Treating Lauralee Waters/Extender: Melburn Hake, HOYT Weeks in Treatment: 3 Wound Status Wound Number: 1 Primary Trauma, Other Etiology: Wound Location: Right Forearm Wound Open Wounding Event: Trauma Status: Date Acquired: 03/01/2018  Comorbid Sleep Apnea, Coronary Artery Disease, Weeks Of Treatment: 3 History: Hypertension, Type II Diabetes Clustered Wound: No Photos Photo Uploaded By: Secundino Ginger on 03/27/2018 09:29:14 Wound Measurements Length: (cm) 3.5 Width: (cm) 2.3 Depth: (cm) 0.1 Area: (cm) 6.322 Volume: (cm) 0.632 % Reduction in Area: 54% % Reduction in Volume: 54% Epithelialization: None Tunneling: No Undermining: No Wound Description Full Thickness Without Exposed Support Foul Od Classification: Structures Slough/ Wound Margin: Flat and Intact Exudate Large Amount: Exudate Type: Serosanguineous Exudate Color: red, brown or After Cleansing: No Fibrino Yes Wound Bed Granulation Amount: Small (1-33%) Exposed Structure Granulation Quality: Red, Pink Fascia Exposed: No Necrotic Amount:  Small (1-33%) Fat Layer (Subcutaneous Tissue) Exposed: Yes Necrotic Quality: Adherent Slough Tendon Exposed: No Muscle Exposed: No Joint Exposed: No Bone Exposed: No Roy [name], [name] ([id number]) Periwound Skin Texture Texture Color No Abnormalities Noted: No No Abnormalities Noted: No Callus: No Atrophie Blanche: No Crepitus: No Cyanosis: No Excoriation: Yes Ecchymosis: No Induration: No Erythema: No Rash: No Hemosiderin Staining: Yes Scarring: No Mottled: No Pallor: No Moisture Rubor: No No Abnormalities Noted: No Dry / Scaly: No Temperature / Pain Maceration: No Temperature: No Abnormality Wound Preparation Ulcer Cleansing: Rinsed/Irrigated with Saline Topical Anesthetic Applied: Other: lidocaine 4%, Electronic Signature(s) Signed: 03/27/2018 4:33:03 PM By: Secundino Ginger Entered By: Secundino Ginger on 03/27/2018 09:13:59 Roy [name], [name] ([id number]) -------------------------------------------------------------------------------- Vitals Details Patient Name: Roy [name], [name] Date of Service: 03/27/2018 9:15 AM Medical Record Number: [id number] Patient Account Number: [id number] Date of Birth/Sex: 1932/07/23 (85 y.o. M) Treating RN: Cornell Barman Primary Care Fabiano Ginley: [name] Other Clinician: Referring Georganna Maxson: [name] Treating Ellanie Oppedisano/Extender: Melburn Hake, HOYT Weeks in Treatment: 3 Vital Signs Time Taken: 09:06 Temperature (F): 97.5 Height (in): 69 Pulse (bpm): 58 Weight (lbs): 181 Respiratory Rate (breaths/min): 16 Body Mass Index (BMI): 26.7 Blood Pressure (mmHg): 169/68 Reference Range: 80 - 120 mg / dl Airway Electronic Signature(s) Signed: 03/27/2018 11:33:04 AM By: Lorine Bears RCP, RRT, CHT Entered By: Lorine Bears on 03/27/2018 09:09:30

## 2018-04-01 NOTE — Progress Notes (Signed)
Roy [name] ([id number]) Visit Report for 03/27/2018 Chief Complaint Document Details Patient Name: Roy [name], [name] Date of Service: 03/27/2018 9:15 AM Medical Record Number: [id number] Patient Account Number: [id number] Date of Birth/Sex: 11-22-1932 (85 y.o. M) Treating RN: Cornell Barman Primary Care Provider: [name] Other Clinician: Referring Provider: [name] Treating Provider/Extender: Worthy Keeler Weeks in Treatment: 3 Information Obtained from: Patient Chief Complaint Donkey bite/Trauma right forearm Electronic Signature(s) Signed: 03/29/2018 2:36:46 AM By: Worthy Keeler PA-C Entered By: Worthy Keeler on 03/27/2018 09:19:55 Waites, Eric R. ([id number]) -------------------------------------------------------------------------------- HPI Details Patient Name: Tutton, [name] Date of Service: 03/27/2018 9:15 AM Medical Record Number: [id number] Patient Account Number: [id number] Date of Birth/Sex: November 19, 1932 (85 y.o. M) Treating RN: Cornell Barman Primary Care Provider: [name] Other Clinician: Referring Provider: [name] Treating Provider/Extender: Melburn Hake, HOYT Weeks in Treatment: 3 History of Present Illness HPI Description: 03/06/18 on evaluation today patient appears to be doing okay upon initial evaluation here in our clinic. He does have a skin tear which occurred on the right form which was a result of having been bitten by his daughter's donkey. He states that the animal is normally very mild tempered and in fact was not even mad when this occurred he had an Krahn and was attempting to feed the donkey the donkey missed and biting for the [place] and got his arm through his coat. It did not pierce the coat but nonetheless just the pressure of the bite itself because the skin breakdown. Nonetheless he did have a significant skin tear which required that he go to the hospital at the emergency department on Saturday, March 01, 2018. Subsequently he was given clindamycin 300 mg four times a day. He states that his the does have diabetes and is A1c is around 5.1 at the last check. No fevers chills noted he does tell me that they had a difficult time getting his breathing under control at the emergency department when he was there. Fortunately this does not seem to be waiting separately this point although it is going to require some debridement bleeding may become an issue. Otherwise the patient has no other major medical problems and aspirin is the only blood thinner that he is on at this point. 03/13/18 on evaluation today patient's arm appears to be doing significantly better which is good news. He's been tolerating the dressing changes without complication. Fortunately there is no sign of infection. No fevers, chills, nausea, or vomiting noted at this time. 03/20/18 on evaluation today patient's arm ulcer actually appears to be doing excellent at this time. This has a great granulation surface and has done extremely well with the [facility] Dressing for the last week in my pinion. There is no sign of infection. No fevers, chills, nausea, or vomiting noted at this time. 03/27/18 on evaluation today patient actually appears to be doing very well in regard to his right forearm ulcer. This appears to show signs of great improvement and though it is healing slowly it has fielding quite nicely and hopefully will start and continue to show signs of epithelialization over the next few weeks. No fevers, chills, nausea, or vomiting noted at this time. Electronic Signature(s) Signed: 03/29/2018 2:36:46 AM By: Worthy Keeler PA-C Entered By: Worthy Keeler on 03/29/2018 01:24:05 Risby, Pincus Sanes ([id number]) -------------------------------------------------------------------------------- Physical Exam Details Patient Name: Teegarden, [name] Date of Service: 03/27/2018 9:15 AM Medical Record Number: [id number] Patient Account  Number: [id number] Date of Birth/Sex: 05-07-1932 (  85 y.o. M) Treating RN: Cornell Barman Primary Care Provider: [name] Other Clinician: Referring Provider: [name] Treating Provider/Extender: Melburn Hake, HOYT Weeks in Treatment: 3 Constitutional Well-nourished and well-hydrated in no acute distress. Respiratory normal breathing without difficulty. clear to auscultation bilaterally. Cardiovascular regular rate and rhythm with normal S1, S2. Psychiatric this patient is able to make decisions and demonstrates good insight into disease process. Alert and Oriented x 3. pleasant and cooperative. Notes Upon inspection today patient actually appears to be doing very well at this point in regard to his ulcer no sharp debridement was required. Fortunately he has good granulation noted and I do believe that the [facility] Dressing has been of benefit for him. Electronic Signature(s) Signed: 03/29/2018 2:36:46 AM By: Worthy Keeler PA-C Entered By: Worthy Keeler on 03/29/2018 01:24:35 Moncrieffe, Pincus Sanes ([id number]) -------------------------------------------------------------------------------- Physician Orders Details Patient Name: Oxley, [name] Date of Service: 03/27/2018 9:15 AM Medical Record Number: [id number] Patient Account Number: [id number] Date of Birth/Sex: May 17, 1932 (85 y.o. M) Treating RN: Cornell Barman Primary Care Provider: [name] Other Clinician: Referring Provider: [name] Treating Provider/Extender: Melburn Hake, HOYT Weeks in Treatment: 3 Verbal / Phone Orders: No Diagnosis Coding ICD-10 Coding Code Description W55.81XA Bitten by other mammals, initial encounter S51.801A Unspecified open wound of right forearm, initial encounter E11.622 Type 2 diabetes mellitus with other skin ulcer Wound Cleansing Wound #1 Right Forearm o Cleanse wound with mild soap and water - Clean wound with Dial antibacterial soap (pump), gently pat to dry. o May  Shower, gently pat wound dry prior to applying new dressing. Primary Wound Dressing Wound #1 Right Forearm o Hydrafera Blue Ready Transfer Secondary Dressing Wound #1 Right Forearm o Dry Gauze o Conform/Kerlix o Coban Dressing Change Frequency Wound #1 Right Forearm o Change dressing every other day. Follow-up Appointments Wound #1 Right Forearm o Return Appointment in 1 week. Electronic Signature(s) Signed: 03/27/2018 5:43:44 PM By: Gretta Cool, BSN, RN, CWS, Kim RN, BSN Signed: 03/29/2018 2:36:46 AM By: Worthy Keeler PA-C Entered By: Gretta Cool BSN, RN, CWS, Kim on 03/27/2018 10:04:50 Spradley, Pincus Sanes ([id number]) -------------------------------------------------------------------------------- Problem List Details Patient Name: Fritzsche, Everette R. Date of Service: 03/27/2018 9:15 AM Medical Record Number: [id number] Patient Account Number: [id number] Date of Birth/Sex: 1932/05/16 (85 y.o. M) Treating RN: Cornell Barman Primary Care Provider: [name] Other Clinician: Referring Provider: [name] Treating Provider/Extender: Melburn Hake, HOYT Weeks in Treatment: 3 Active Problems ICD-10 Evaluated Encounter Code Description Active Date Today Diagnosis W55.81XA Bitten by other mammals, initial encounter 03/06/2018 No Yes S51.801A Unspecified open wound of right forearm, initial encounter 03/06/2018 No Yes E11.622 Type 2 diabetes mellitus with other skin ulcer 03/06/2018 No Yes Inactive Problems Resolved Problems Electronic Signature(s) Signed: 03/29/2018 2:36:46 AM By: Worthy Keeler PA-C Entered By: Worthy Keeler on 03/27/2018 09:19:47 Courtright, Darren R. ([id number]) -------------------------------------------------------------------------------- Progress Note Details Patient Name: Bee, Fillmore R. Date of Service: 03/27/2018 9:15 AM Medical Record Number: [id number] Patient Account Number: [id number] Date of Birth/Sex: May 14, 1932 (85 y.o. M) Treating RN: Cornell Barman Primary  Care Provider: [name] Other Clinician: Referring Provider: [name] Treating Provider/Extender: Melburn Hake, HOYT Weeks in Treatment: 3 Subjective Chief Complaint Information obtained from Patient Pollie Friar bite/Trauma right forearm History of Present Illness (HPI) 03/06/18 on evaluation today patient appears to be doing okay upon initial evaluation here in our clinic. He does have a skin tear which occurred on the right form which was a result of having been bitten by his daughter's donkey. He  states that the animal is normally very mild tempered and in fact was not even mad when this occurred he had an Hubble and was attempting to feed the donkey the donkey missed and biting for the [place] and got his arm through his coat. It did not pierce the coat but nonetheless just the pressure of the bite itself because the skin breakdown. Nonetheless he did have a significant skin tear which required that he go to the hospital at the emergency department on Saturday, March 01, 2018. Subsequently he was given clindamycin 300 mg four times a day. He states that his the does have diabetes and is A1c is around 5.1 at the last check. No fevers chills noted he does tell me that they had a difficult time getting his breathing under control at the emergency department when he was there. Fortunately this does not seem to be waiting separately this point although it is going to require some debridement bleeding may become an issue. Otherwise the patient has no other major medical problems and aspirin is the only blood thinner that he is on at this point. 03/13/18 on evaluation today patient's arm appears to be doing significantly better which is good news. He's been tolerating the dressing changes without complication. Fortunately there is no sign of infection. No fevers, chills, nausea, or vomiting noted at this time. 03/20/18 on evaluation today patient's arm ulcer actually appears to be doing  excellent at this time. This has a great granulation surface and has done extremely well with the [facility] Dressing for the last week in my pinion. There is no sign of infection. No fevers, chills, nausea, or vomiting noted at this time. 03/27/18 on evaluation today patient actually appears to be doing very well in regard to his right forearm ulcer. This appears to show signs of great improvement and though it is healing slowly it has fielding quite nicely and hopefully will start and continue to show signs of epithelialization over the next few weeks. No fevers, chills, nausea, or vomiting noted at this time. Patient History Information obtained from Patient. Family History Cancer - Siblings, Diabetes - Siblings, Heart Disease - Siblings, Kidney Disease - Siblings, No family history of Hereditary Spherocytosis, Hypertension, Lung Disease, Seizures, Stroke, Thyroid Problems, Tuberculosis. Social History Former smoker - ended on 03/05/1958, Marital Status - Married, Alcohol Use - Never, Drug Use - No History, Caffeine Use - Daily. Medical And Surgical History Notes Hematologic/Lymphatic Thrombocytopenia Mcclaine, Yovan R. ([id number]) Review of Systems (ROS) Constitutional Symptoms (General Health) Denies complaints or symptoms of Fever, Chills. Respiratory The patient has no complaints or symptoms. Cardiovascular The patient has no complaints or symptoms. Psychiatric The patient has no complaints or symptoms. Objective Constitutional Well-nourished and well-hydrated in no acute distress. Vitals Time Taken: 9:06 AM, Height: 69 in, Weight: 181 lbs, BMI: 26.7, Temperature: 97.5 F, Pulse: 58 bpm, Respiratory Rate: 16 breaths/min, Blood Pressure: 169/68 mmHg. Respiratory normal breathing without difficulty. clear to auscultation bilaterally. Cardiovascular regular rate and rhythm with normal S1, S2. Psychiatric this patient is able to make decisions and demonstrates good insight  into disease process. Alert and Oriented x 3. pleasant and cooperative. General Notes: Upon inspection today patient actually appears to be doing very well at this point in regard to his ulcer no sharp debridement was required. Fortunately he has good granulation noted and I do believe that the [facility] Dressing has been of benefit for him. Integumentary (Hair, Skin) Wound #1 status is Open. Original cause of  wound was Trauma. The wound is located on the Right Forearm. The wound measures 3.5cm length x 2.3cm width x 0.1cm depth; 6.322cm^2 area and 0.632cm^3 volume. There is Fat Layer (Subcutaneous Tissue) Exposed exposed. There is no tunneling or undermining noted. There is a large amount of serosanguineous drainage noted. The wound margin is flat and intact. There is small (1-33%) red, pink granulation within the wound bed. There is a small (1-33%) amount of necrotic tissue within the wound bed including Adherent Slough. The periwound skin appearance exhibited: Excoriation, Hemosiderin Staining. The periwound skin appearance did not exhibit: Callus, Crepitus, Induration, Rash, Scarring, Dry/Scaly, Maceration, Atrophie Blanche, Cyanosis, Ecchymosis, Mottled, Pallor, Rubor, Erythema. Periwound temperature was noted as No Abnormality. Assessment Christo, Wister R. ([id number]) Active Problems ICD-10 Bitten by other mammals, initial encounter Unspecified open wound of right forearm, initial encounter Type 2 diabetes mellitus with other skin ulcer Plan Wound Cleansing: Wound #1 Right Forearm: Cleanse wound with mild soap and water - Clean wound with Dial antibacterial soap (pump), gently pat to dry. May Shower, gently pat wound dry prior to applying new dressing. Primary Wound Dressing: Wound #1 Right Forearm: Hydrafera Blue Ready Transfer Secondary Dressing: Wound #1 Right Forearm: Dry Gauze Conform/Kerlix Coban Dressing Change Frequency: Wound #1 Right Forearm: Change dressing  every other day. Follow-up Appointments: Wound #1 Right Forearm: Return Appointment in 1 week. My suggestion currently is gonna be that we continue with the above wound care measures for the next week. Patient is in agreement with the plan. We will subsequently see were things stand at follow-up. Please see above for specific wound care orders. We will see patient for re-evaluation in 1 week(s) here in the clinic. If anything worsens or changes patient will contact our office for additional recommendations. Electronic Signature(s) Signed: 03/29/2018 2:36:46 AM By: Worthy Keeler PA-C Entered By: Worthy Keeler on 03/29/2018 01:25:01 Waggoner, Pincus Sanes ([id number]) -------------------------------------------------------------------------------- ROS/PFSH Details Patient Name: Feagan, Antwann R. Date of Service: 03/27/2018 9:15 AM Medical Record Number: [id number] Patient Account Number: [id number] Date of Birth/Sex: 08/21/1932 (85 y.o. M) Treating RN: Cornell Barman Primary Care Provider: [name] Other Clinician: Referring Provider: [name] Treating Provider/Extender: Melburn Hake, HOYT Weeks in Treatment: 3 Information Obtained From Patient Wound History Do you currently have one or more open woundso Yes How many open wounds do you currently haveo 1 Approximately how long have you had your woundso March 01, 2018 How have you been treating your wound(s) until nowo coban, soap and water Has your wound(s) ever healed and then re-openedo No Have you had any lab work done in the past montho No Have you tested positive for an antibiotic resistant organism (MRSA, VRE)o No Have you tested positive for osteomyelitis (bone infection)o No Have you had any tests for circulation on your legso No Have you had other problems associated with your woundso Swelling Constitutional Symptoms (General Health) Complaints and Symptoms: Negative for: Fever; Chills Hematologic/Lymphatic Medical  History: Past Medical History Notes: Thrombocytopenia Respiratory Complaints and Symptoms: No Complaints or Symptoms Medical History: Positive for: Sleep Apnea Cardiovascular Complaints and Symptoms: No Complaints or Symptoms Medical History: Positive for: Coronary Artery Disease; Hypertension Endocrine Medical History: Positive for: Type II Diabetes Time with diabetes: 8 years Vroom, Sumeet R. ([id number]) Psychiatric Complaints and Symptoms: No Complaints or Symptoms Immunizations Pneumococcal Vaccine: Received Pneumococcal Vaccination: Yes Implantable Devices Family and Social History Cancer: Yes - Siblings; Diabetes: Yes - Siblings; Heart Disease: Yes - Siblings; Hereditary Spherocytosis: No; Hypertension: No; Kidney Disease: Yes -  Siblings; Lung Disease: No; Seizures: No; Stroke: No; Thyroid Problems: No; Tuberculosis: No; Former smoker - ended on 03/05/1958; Marital Status - Married; Alcohol Use: Never; Drug Use: No History; Caffeine Use: Daily; Financial Concerns: No; Food, Clothing or Shelter Needs: No; Support System Lacking: No; Transportation Concerns: No; Advanced Directives: No; Patient does not want information on Advanced Directives; Living Will: No; Medical Power of Attorney: No Physician Affirmation I have reviewed and agree with the above information. Electronic Signature(s) Signed: 03/29/2018 2:36:46 AM By: Worthy Keeler PA-C Signed: 03/31/2018 4:45:46 PM By: Gretta Cool, BSN, RN, CWS, Kim RN, BSN Entered By: Worthy Keeler on 03/29/2018 01:24:23 Brumbach, Pincus Sanes ([id number]) -------------------------------------------------------------------------------- SuperBill Details Patient Name: Gully, Zuri R. Date of Service: 03/27/2018 Medical Record Number: [id number] Patient Account Number: [id number] Date of Birth/Sex: [birth year] (85 y.o. M) Treating RN: Cornell Barman Primary Care Provider: [name] Other Clinician: Referring Provider: [name] Treating  Provider/Extender: Melburn Hake, HOYT Weeks in Treatment: 3 Diagnosis Coding ICD-10 Codes Code Description [phone] Bitten by other mammals, initial encounter S51.801A Unspecified open wound of right forearm, initial encounter E11.622 Type 2 diabetes mellitus with other skin ulcer Facility Procedures CPT4 Code: [id number] Description: [phone] - WOUND CARE VISIT-LEV 2 EST PT Modifier: Quantity: 1 Physician Procedures CPT4 Code: [id number] Description: 73710 - WC PHYS LEVEL 4 - EST PT ICD-10 Diagnosis Description W55.81XA Bitten by other mammals, initial encounter S51.801A Unspecified open wound of right forearm, initial encounte E11.622 Type 2 diabetes mellitus with other skin ulcer Modifier: r Quantity: 1 Electronic Signature(s) Signed: 03/29/2018 2:36:46 AM By: Worthy Keeler PA-C Entered By: Worthy Keeler on 03/29/2018 01:25:22

## 2018-04-03 ENCOUNTER — Emergency Department: Payer: PPO

## 2018-04-03 ENCOUNTER — Encounter: Payer: PPO | Admitting: Physician Assistant

## 2018-04-03 ENCOUNTER — Observation Stay
Admission: EM | Admit: 2018-04-03 | Discharge: 2018-04-05 | Disposition: A | Discharge status: Home / Self Care | Payer: PPO | Attending: Internal Medicine | Admitting: Internal Medicine

## 2018-04-03 ENCOUNTER — Encounter: Payer: Self-pay | Admitting: Emergency Medicine

## 2018-04-03 ENCOUNTER — Other Ambulatory Visit: Payer: Self-pay

## 2018-04-03 DIAGNOSIS — Z87891 Personal history of nicotine dependence: Secondary | ICD-10-CM | POA: Diagnosis not present

## 2018-04-03 DIAGNOSIS — K210 Gastro-esophageal reflux disease with esophagitis: Secondary | ICD-10-CM | POA: Insufficient documentation

## 2018-04-03 DIAGNOSIS — I2510 Atherosclerotic heart disease of native coronary artery without angina pectoris: Secondary | ICD-10-CM | POA: Insufficient documentation

## 2018-04-03 DIAGNOSIS — Z794 Long term (current) use of insulin: Secondary | ICD-10-CM | POA: Insufficient documentation

## 2018-04-03 DIAGNOSIS — Z955 Presence of coronary angioplasty implant and graft: Secondary | ICD-10-CM | POA: Diagnosis not present

## 2018-04-03 DIAGNOSIS — R112 Nausea with vomiting, unspecified: Secondary | ICD-10-CM | POA: Diagnosis not present

## 2018-04-03 DIAGNOSIS — Z79899 Other long term (current) drug therapy: Secondary | ICD-10-CM | POA: Insufficient documentation

## 2018-04-03 DIAGNOSIS — T18128 Food in esophagus causing other injury: Secondary | ICD-10-CM | POA: Diagnosis not present

## 2018-04-03 DIAGNOSIS — E1151 Type 2 diabetes mellitus with diabetic peripheral angiopathy without gangrene: Secondary | ICD-10-CM | POA: Diagnosis not present

## 2018-04-03 DIAGNOSIS — G4730 Sleep apnea, unspecified: Secondary | ICD-10-CM | POA: Diagnosis not present

## 2018-04-03 DIAGNOSIS — Z7982 Long term (current) use of aspirin: Secondary | ICD-10-CM | POA: Diagnosis not present

## 2018-04-03 DIAGNOSIS — Z7951 Long term (current) use of inhaled steroids: Secondary | ICD-10-CM | POA: Diagnosis not present

## 2018-04-03 DIAGNOSIS — J45909 Unspecified asthma, uncomplicated: Secondary | ICD-10-CM | POA: Diagnosis not present

## 2018-04-03 DIAGNOSIS — K259 Gastric ulcer, unspecified as acute or chronic, without hemorrhage or perforation: Secondary | ICD-10-CM | POA: Diagnosis not present

## 2018-04-03 DIAGNOSIS — I252 Old myocardial infarction: Secondary | ICD-10-CM | POA: Diagnosis not present

## 2018-04-03 DIAGNOSIS — Z7984 Long term (current) use of oral hypoglycemic drugs: Secondary | ICD-10-CM | POA: Insufficient documentation

## 2018-04-03 DIAGNOSIS — S51851A Open bite of right forearm, initial encounter: Secondary | ICD-10-CM | POA: Diagnosis not present

## 2018-04-03 DIAGNOSIS — I10 Essential (primary) hypertension: Secondary | ICD-10-CM | POA: Diagnosis not present

## 2018-04-03 DIAGNOSIS — Z8619 Personal history of other infectious and parasitic diseases: Secondary | ICD-10-CM | POA: Insufficient documentation

## 2018-04-03 DIAGNOSIS — Z881 Allergy status to other antibiotic agents status: Secondary | ICD-10-CM | POA: Insufficient documentation

## 2018-04-03 DIAGNOSIS — E1122 Type 2 diabetes mellitus with diabetic chronic kidney disease: Secondary | ICD-10-CM | POA: Diagnosis not present

## 2018-04-03 DIAGNOSIS — N183 Chronic kidney disease, stage 3 (moderate): Secondary | ICD-10-CM | POA: Diagnosis not present

## 2018-04-03 DIAGNOSIS — E785 Hyperlipidemia, unspecified: Secondary | ICD-10-CM | POA: Diagnosis not present

## 2018-04-03 DIAGNOSIS — I129 Hypertensive chronic kidney disease with stage 1 through stage 4 chronic kidney disease, or unspecified chronic kidney disease: Secondary | ICD-10-CM | POA: Insufficient documentation

## 2018-04-03 DIAGNOSIS — T18128A Food in esophagus causing other injury, initial encounter: Secondary | ICD-10-CM | POA: Diagnosis not present

## 2018-04-03 DIAGNOSIS — K449 Diaphragmatic hernia without obstruction or gangrene: Secondary | ICD-10-CM | POA: Insufficient documentation

## 2018-04-03 DIAGNOSIS — E119 Type 2 diabetes mellitus without complications: Secondary | ICD-10-CM | POA: Diagnosis not present

## 2018-04-03 DIAGNOSIS — Z885 Allergy status to narcotic agent status: Secondary | ICD-10-CM | POA: Insufficient documentation

## 2018-04-03 DIAGNOSIS — X58XXXA Exposure to other specified factors, initial encounter: Secondary | ICD-10-CM | POA: Insufficient documentation

## 2018-04-03 DIAGNOSIS — M47812 Spondylosis without myelopathy or radiculopathy, cervical region: Secondary | ICD-10-CM | POA: Insufficient documentation

## 2018-04-03 DIAGNOSIS — K319 Disease of stomach and duodenum, unspecified: Secondary | ICD-10-CM | POA: Insufficient documentation

## 2018-04-03 DIAGNOSIS — D696 Thrombocytopenia, unspecified: Secondary | ICD-10-CM | POA: Insufficient documentation

## 2018-04-03 DIAGNOSIS — M542 Cervicalgia: Secondary | ICD-10-CM | POA: Diagnosis not present

## 2018-04-03 DIAGNOSIS — W44F3XA Food entering into or through a natural orifice, initial encounter: Secondary | ICD-10-CM | POA: Diagnosis present

## 2018-04-03 DIAGNOSIS — L98492 Non-pressure chronic ulcer of skin of other sites with fat layer exposed: Secondary | ICD-10-CM | POA: Diagnosis not present

## 2018-04-03 LAB — GLUCOSE, CAPILLARY: Glucose-Capillary: 141 mg/dL — ABNORMAL HIGH (ref 70–99)

## 2018-04-03 LAB — CBC WITH DIFFERENTIAL/PLATELET
Abs Immature Granulocytes: 0.07 10*3/uL (ref 0.00–0.07)
BASOS PCT: 1 %
Basophils Absolute: 0.1 10*3/uL (ref 0.0–0.1)
Eosinophils Absolute: 1 10*3/uL — ABNORMAL HIGH (ref 0.0–0.5)
Eosinophils Relative: 10 %
HCT: 39.3 % (ref 39.0–52.0)
Hemoglobin: 12.9 g/dL — ABNORMAL LOW (ref 13.0–17.0)
IMMATURE GRANULOCYTES: 1 %
Lymphocytes Relative: 20 %
Lymphs Abs: 2.1 10*3/uL (ref 0.7–4.0)
MCH: 28.8 pg (ref 26.0–34.0)
MCHC: 32.8 g/dL (ref 30.0–36.0)
MCV: 87.7 fL (ref 80.0–100.0)
Monocytes Absolute: 0.8 10*3/uL (ref 0.1–1.0)
Monocytes Relative: 7 %
NEUTROS PCT: 61 %
Neutro Abs: 6.3 10*3/uL (ref 1.7–7.7)
Platelets: 136 10*3/uL — ABNORMAL LOW (ref 150–400)
RBC: 4.48 MIL/uL (ref 4.22–5.81)
RDW: 13.6 % (ref 11.5–15.5)
WBC: 10.4 10*3/uL (ref 4.0–10.5)
nRBC: 0 % (ref 0.0–0.2)

## 2018-04-03 LAB — COMPREHENSIVE METABOLIC PANEL
ALT: 22 U/L (ref 0–44)
AST: 22 U/L (ref 15–41)
Albumin: 4.9 g/dL (ref 3.5–5.0)
Alkaline Phosphatase: 60 U/L (ref 38–126)
Anion gap: 5 (ref 5–15)
BUN: 29 mg/dL — AB (ref 8–23)
CHLORIDE: 107 mmol/L (ref 98–111)
CO2: 28 mmol/L (ref 22–32)
Calcium: 9.4 mg/dL (ref 8.9–10.3)
Creatinine, Ser: 1.97 mg/dL — ABNORMAL HIGH (ref 0.61–1.24)
GFR calc Af Amer: 35 mL/min — ABNORMAL LOW (ref >60)
GFR, EST NON AFRICAN AMERICAN: 31 mL/min — AB (ref >60)
Glucose, Bld: 98 mg/dL (ref 70–99)
Potassium: 3.9 mmol/L (ref 3.5–5.1)
Sodium: 140 mmol/L (ref 135–145)
Total Bilirubin: 1 mg/dL (ref 0.3–1.2)
Total Protein: 7.7 g/dL (ref 6.5–8.1)

## 2018-04-03 MED ORDER — GLUCAGON HCL RDNA (DIAGNOSTIC) 1 MG IJ SOLR
1.0000 mg | Freq: Once | INTRAMUSCULAR | Status: AC
  Administered 2018-04-03: 1 mg via INTRAVENOUS

## 2018-04-03 MED ORDER — GLUCAGON HCL RDNA (DIAGNOSTIC) 1 MG IJ SOLR
INTRAMUSCULAR | Status: AC
  Administered 2018-04-03: 1 mg via INTRAVENOUS
  Filled 2018-04-03: qty 1

## 2018-04-03 NOTE — ED Notes (Signed)
Patient transported to X-ray 

## 2018-04-03 NOTE — ED Triage Notes (Signed)
Pt arrived with concerns over chicken being stuck in his throat since 1400 today. Pt able to communicate with no distress observed. Pt denies pain.

## 2018-04-03 NOTE — ED Provider Notes (Signed)
[facility] Emergency Department Provider Note  Time seen 11:15 PM I have reviewed the triage vital signs and the nursing notes.   HISTORY  Chief Complaint Foreign Body (in throat )   HPI Roy [name] is a 85 y.o. male with below list of chronic medical conditions presents to the emergency department with "chicken stuck in my throat since 2:00 the afternoon".  Patient denies any chest pain no shortness of breath.  Patient's points to the area of the suprasternal notch as a location of discomfort   Past Medical History:  Diagnosis Date  . Anginal pain ([place])   . Arthritis    spine  . Asthma   . CAD (coronary artery disease)    s/p PCI of the D1 with cath in 2013 showing nonobstructive disease.  CP presumed due to small vessel disease.  [name] CAP (community acquired pneumonia) 12/26/2011  . Chronic kidney disease    renal insufficiency  . Diabetes mellitus, type 2 ([place])   . Fever 12/26/2011  . GERD (gastroesophageal reflux disease)   . Heart murmur   . Hyperlipidemia   . Hypertension   . Lyme disease    states had shakes that were thought to be parkinson's related but related to tick bite, states also RMSF  . Myocardial infarction ([place])   . Shortness of breath   . Sleep apnea    uses cpap    Patient Active Problem List   Diagnosis Date Noted  . Food impaction of esophagus 04/03/2018  . Senile purpura ([place]) 08/08/2017  . Thrombocytopenia ([place]) 02/23/2015  . GERD (gastroesophageal reflux disease) 05/03/2014  . Insomnia 05/03/2014  . Allergic rhinitis 04/21/2014  . Giardia 04/21/2014  . Mild persistent asthma 10/30/2010  . CKD (chronic kidney disease), stage III ([place]) 02/22/2009  . NEPHROLITHIASIS, RECURRENT 01/21/2009  . ACTINIC KERATOSIS, HEAD 06/14/2008  . Type II diabetes mellitus with renal manifestations ([place]) 09/11/2006  . Hyperlipidemia 09/11/2006  . Hypertension associated with diabetes ([place]) 09/11/2006  . Coronary artery disease with  angina pectoris ([place]) 09/11/2006  . Back pain 09/11/2006  . Sleep apnea 09/11/2006    Past Surgical History:  Procedure Laterality Date  . APPENDECTOMY    . CERVICAL LAMINECTOMY    . CHOLECYSTECTOMY    . KIDNEY STONE SURGERY    . LEFT HEART CATH N/A 02/15/2012   Procedure: LEFT HEART CATH;  Surgeon: Larey Dresser, MD;  Location: [facility] CATH LAB;  Service: Cardiovascular;  Laterality: N/A;    Prior to Admission medications   Medication Sig Start Date End Date Taking? Authorizing Provider  acetaminophen (TYLENOL) 325 MG tablet Take 650 mg by mouth every evening. Reported on 06/28/2015    [provider]  albuterol (PROVENTIL HFA;VENTOLIN HFA) 108 (90 Base) MCG/ACT inhaler Inhale 2 puffs into the lungs every 4 (four) hours as needed for wheezing or shortness of breath. 08/08/17   Marin Olp, MD  amLODipine (NORVASC) 2.5 MG tablet Take 1 tablet (2.5 mg total) by mouth daily. 12/09/17   Marin Olp, MD  aspirin EC 81 MG tablet Take 1 tablet (81 mg total) by mouth daily. 03/11/12   Larey Dresser, MD  ASSURE COMFORT LANCETS 30G MISC 1 each by Other route daily. 12/02/13   [provider]  atorvastatin (LIPITOR) 10 MG tablet Take 1 tablet (10 mg total) by mouth daily. 04/09/17   Marin Olp, MD  budesonide-formoterol [facility]) 160-4.5 MCG/ACT inhaler Inhale 2 puffs into the lungs 2 (two)  times daily. 09/10/17   Juanito Doom, MD  calcium-vitamin D (OSCAL WITH D) 500-200 MG-UNIT per tablet Take 1 tablet by mouth daily.    [provider]  famotidine (PEPCID) 20 MG tablet Take 1 tablet (20 mg total) by mouth 2 (two) times daily. 01/24/18   Marin Olp, MD  glimepiride (AMARYL) 4 MG tablet Take 1 tablet (4 mg total) by mouth daily with breakfast. 12/09/17   Marin Olp, MD  glucose blood (ACCU-CHEK AVIVA PLUS) test strip Test blood glucose daily. 07/29/15   Marin Olp, MD  glucose blood test strip One Touch Ultra Test Strips 08/02/15    Marin Olp, MD  isosorbide mononitrate (IMDUR) 30 MG 24 hr tablet TAKE 3 TABLETS BY MOUTH ONCE DAILY 01/01/18   Marin Olp, MD  lisinopril (PRINIVIL,ZESTRIL) 5 MG tablet TAKE 1/2 (ONE-HALF) TABLET BY MOUTH ONCE DAILY 01/17/18   Marin Olp, MD  metFORMIN (GLUCOPHAGE) 500 MG tablet TAKE 1/2 (ONE-HALF) TABLET BY MOUTH ONCE DAILY WITH BREAKFAST 03/10/18   Marin Olp, MD  metoprolol succinate (TOPROL-XL) 50 MG 24 hr tablet TAKE 1 TABLET BY MOUTH ONCE DAILY (TAKE  WITH  OR  IMMEDIATELY  FOLLOWING  A  MEAL) 01/08/18   Marin Olp, MD  nitroGLYCERIN (NITROSTAT) 0.4 MG SL tablet Place 1 tablet (0.4 mg total) under the tongue every 5 (five) minutes as needed for chest pain. 04/09/17   Marin Olp, MD  Omega-3 Fatty Acids (FISH OIL PO) Take 2 capsules by mouth 2 (two) times daily.    [provider]  Polyethyl Glycol-Propyl Glycol (SYSTANE OP) Place 1 drop into both eyes 4 (four) times daily. Itchy/dry eye    [provider]  potassium citrate (UROCIT-K) 10 MEQ (1080 MG) SR tablet Take 10 mEq by mouth Twice daily. 01/29/12   [provider]  ranolazine (RANEXA) 500 MG 12 hr tablet Take 1 tablet (500 mg total) by mouth 2 (two) times daily. 05/28/17   Sueanne Margarita, MD  traMADol (ULTRAM) 50 MG tablet Take 1 tablet (50 mg total) by mouth every 6 (six) hours as needed for moderate pain (twice per day maximum). 12/09/17   Marin Olp, MD  zinc sulfate 220 MG capsule Take 220 mg by mouth daily.    [provider]    Allergies Codeine sulfate; Crestor [rosuvastatin calcium]; and Cephalexin  Family History  Problem Relation Age of Onset  . ALS Father   . Asthma Daughter   . Cancer Sister        breast/liver    Social History Social History   Tobacco Use  . Smoking status: Former Smoker    Packs/day: 1.50    Years: 8.00    Pack years: 12.00    Types: Cigarettes    Last attempt to quit: 03/05/1958    Years since quitting: 60.1    . Smokeless tobacco: Never Used  Substance Use Topics  . Alcohol use: No    Alcohol/week: 0.0 standard drinks  . Drug use: No    Review of Systems Constitutional: No fever/chills Eyes: No visual changes. ENT: No sore throat. Cardiovascular: Denies chest pain. Respiratory: Denies shortness of breath. Gastrointestinal: No abdominal pain.  No nausea, no vomiting.  No diarrhea.  No constipation. Genitourinary: Negative for dysuria. Musculoskeletal: Negative for neck pain.  Negative for back pain. Integumentary: Negative for rash. Neurological: Negative for headaches, focal weakness or numbness.   ____________________________________________   PHYSICAL EXAM:  VITAL  SIGNS: ED Triage Vitals  Enc Vitals Group     BP 04/03/18 2115 (!) 182/72     Pulse Rate 04/03/18 2115 66     Resp 04/03/18 2115 18     Temp 04/03/18 2115 97.6 F (36.4 C)     Temp Source 04/03/18 2115 Oral     SpO2 04/03/18 2115 97 %     Weight 04/03/18 2116 79.4 kg (175 lb)     Height 04/03/18 2116 1.753 m (5\' 9" )     Head Circumference --      Peak Flow --      Pain Score 04/03/18 2114 0     Pain Loc --      Pain Edu? --      Excl. in [place]? --     Constitutional: Alert and oriented. Well appearing and in no acute distress. Eyes: Conjunctivae are normal.  Mouth/Throat: Mucous membranes are moist.  Oropharynx non-erythematous. Neck: No stridor.   Cardiovascular: Normal rate, regular rhythm. Good peripheral circulation. Grossly normal heart sounds. Respiratory: Normal respiratory effort.  No retractions. Lungs CTAB. Gastrointestinal: Soft and nontender. No distention.  Musculoskeletal: No lower extremity tenderness nor edema. No gross deformities of extremities. Neurologic:  Normal speech and language. No gross focal neurologic deficits are appreciated.  Skin:  Skin is warm, dry and intact. No rash noted. Psychiatric: Mood and affect are normal. Speech and behavior are  normal.  ____________________________________________   LABS (all labs ordered are listed, but only abnormal results are displayed)  Labs Reviewed  CBC WITH DIFFERENTIAL/PLATELET - Abnormal; Notable for the following components:      Result Value   Hemoglobin 12.9 (*)    Platelets 136 (*)    Eosinophils Absolute 1.0 (*)    All other components within normal limits  COMPREHENSIVE METABOLIC PANEL - Abnormal; Notable for the following components:   BUN 29 (*)    Creatinine, Ser 1.97 (*)    GFR calc non Af Amer 31 (*)    GFR calc Af Amer 35 (*)    All other components within normal limits  GLUCOSE, CAPILLARY - Abnormal; Notable for the following components:   Glucose-Capillary 141 (*)    All other components within normal limits    ____________________________________________  RADIOLOGY I, [place] Syliva Mee, personally viewed and evaluated these images (plain radiographs) as part of my medical decision making, as well as reviewing the written report by the radiologist.  ED MD interpretation:  Unremarkable soft tissue neck  Official radiology report(s): Dg Neck Soft Tissue  Result Date: 04/03/2018 CLINICAL DATA:  Globus sensation of the throat. EXAM: NECK SOFT TISSUES - 1+ VIEW COMPARISON:  None. FINDINGS: There is no evidence of retropharyngeal soft tissue swelling or epiglottic enlargement. The cervical airway is unremarkable and no radio-opaque foreign body identified. No soft tissue emphysema is identified about the soft tissues of the neck. There is cervical spondylosis with slight reversal cervical lordosis at C4-5. Osteophytes are noted anteriorly which may contribute to the globus sensation from C3 through C7 greatest at C5-6. IMPRESSION: 1. Unremarkable appearance of the soft tissues of the neck. 2. Cervical spondylosis with slight reversal of cervical lordosis at C4-5. 3. Prominent anterior osteophytes from C3 through C7, greatest across the C5-6 interspace and which may  contribute to the globus sensation. Electronically Signed   By: Ashley Royalty M.D.   On: 04/03/2018 22:46     Procedures   ____________________________________________   INITIAL IMPRESSION / ASSESSMENT AND PLAN / ED COURSE  As part of my medical decision making, I reviewed the following data within the electronic MEDICAL RECORD NUMBER   85 year old male with above stated history and physical exam secondary to food impaction of the esophagus.  Soft tissue x-ray of the neck was performed before my evaluation which was negative.  Patient was unable to drink carbonated beverage.  IV glucagon 1 mg was given without improvement.  As such patient was discussed with Dr. Marius Ditch gastroenterologist on-call who recommended admission for observation with plan for endoscopy in the morning.  I spoke with Dr. Anselm Jungling who admitted the patient. _____________________  FINAL CLINICAL IMPRESSION(S) / ED DIAGNOSES  Final diagnoses:  Food impaction of esophagus, initial encounter     MEDICATIONS GIVEN DURING THIS VISIT:  Medications  glucagon (human recombinant) (GLUCAGEN) injection 1 mg (1 mg Intravenous Given 04/03/18 2323)     ED Discharge Orders    None       Note:  This document was prepared using Dragon voice recognition software and may include unintentional dictation errors.    Gregor Hams, MD 04/04/18 [phone]

## 2018-04-03 NOTE — ED Notes (Signed)
Pt states that he was eating and then he felt as if there was chicken stuck in his throat. Pt states that he has tried everything but still wasn't able to get the chicken down. Pt not able to get spit down but in NAD at this time.

## 2018-04-04 ENCOUNTER — Encounter: Payer: Self-pay | Admitting: Anesthesiology

## 2018-04-04 ENCOUNTER — Observation Stay: Payer: PPO | Admitting: Anesthesiology

## 2018-04-04 ENCOUNTER — Other Ambulatory Visit: Payer: Self-pay

## 2018-04-04 ENCOUNTER — Encounter
Admission: EM | Disposition: A | Discharge status: Home / Self Care | Payer: Self-pay | Source: Home / Self Care | Attending: Emergency Medicine

## 2018-04-04 DIAGNOSIS — G4730 Sleep apnea, unspecified: Secondary | ICD-10-CM | POA: Diagnosis not present

## 2018-04-04 DIAGNOSIS — E1122 Type 2 diabetes mellitus with diabetic chronic kidney disease: Secondary | ICD-10-CM | POA: Diagnosis not present

## 2018-04-04 DIAGNOSIS — T18108A Unspecified foreign body in esophagus causing other injury, initial encounter: Secondary | ICD-10-CM

## 2018-04-04 DIAGNOSIS — K219 Gastro-esophageal reflux disease without esophagitis: Secondary | ICD-10-CM | POA: Diagnosis not present

## 2018-04-04 DIAGNOSIS — K259 Gastric ulcer, unspecified as acute or chronic, without hemorrhage or perforation: Secondary | ICD-10-CM | POA: Diagnosis not present

## 2018-04-04 DIAGNOSIS — I10 Essential (primary) hypertension: Secondary | ICD-10-CM | POA: Diagnosis not present

## 2018-04-04 DIAGNOSIS — K209 Esophagitis, unspecified: Secondary | ICD-10-CM | POA: Diagnosis not present

## 2018-04-04 DIAGNOSIS — I129 Hypertensive chronic kidney disease with stage 1 through stage 4 chronic kidney disease, or unspecified chronic kidney disease: Secondary | ICD-10-CM | POA: Diagnosis not present

## 2018-04-04 DIAGNOSIS — E119 Type 2 diabetes mellitus without complications: Secondary | ICD-10-CM | POA: Diagnosis not present

## 2018-04-04 DIAGNOSIS — T18128A Food in esophagus causing other injury, initial encounter: Secondary | ICD-10-CM | POA: Diagnosis not present

## 2018-04-04 DIAGNOSIS — I2510 Atherosclerotic heart disease of native coronary artery without angina pectoris: Secondary | ICD-10-CM | POA: Diagnosis not present

## 2018-04-04 DIAGNOSIS — K3189 Other diseases of stomach and duodenum: Secondary | ICD-10-CM | POA: Diagnosis not present

## 2018-04-04 DIAGNOSIS — N183 Chronic kidney disease, stage 3 (moderate): Secondary | ICD-10-CM | POA: Diagnosis not present

## 2018-04-04 DIAGNOSIS — T189XXA Foreign body of alimentary tract, part unspecified, initial encounter: Secondary | ICD-10-CM | POA: Diagnosis not present

## 2018-04-04 DIAGNOSIS — K210 Gastro-esophageal reflux disease with esophagitis: Secondary | ICD-10-CM | POA: Diagnosis not present

## 2018-04-04 HISTORY — PX: ESOPHAGOGASTRODUODENOSCOPY: SHX5428

## 2018-04-04 LAB — GLUCOSE, CAPILLARY
Glucose-Capillary: 79 mg/dL (ref 70–99)
Glucose-Capillary: 145 mg/dL — ABNORMAL HIGH (ref 70–99)
Glucose-Capillary: 71 mg/dL (ref 70–99)
Glucose-Capillary: 109 mg/dL — ABNORMAL HIGH (ref 70–99)
Glucose-Capillary: 155 mg/dL — ABNORMAL HIGH (ref 70–99)
Glucose-Capillary: 221 mg/dL — ABNORMAL HIGH (ref 70–99)
Glucose-Capillary: 139 mg/dL — ABNORMAL HIGH (ref 70–99)

## 2018-04-04 SURGERY — EGD (ESOPHAGOGASTRODUODENOSCOPY)
Anesthesia: General

## 2018-04-04 MED ORDER — LIDOCAINE HCL 1 % IJ SOLN
INTRAMUSCULAR | Status: DC | PRN
  Administered 2018-04-04: 50 mg via INTRADERMAL

## 2018-04-04 MED ORDER — EPHEDRINE SULFATE 50 MG/ML IJ SOLN
INTRAMUSCULAR | Status: AC
  Filled 2018-04-04: qty 1

## 2018-04-04 MED ORDER — HEPARIN SODIUM (PORCINE) 5000 UNIT/ML IJ SOLN
5000.0000 [IU] | Freq: Three times a day (TID) | INTRAMUSCULAR | Status: DC
  Administered 2018-04-04 – 2018-04-05 (×4): 5000 [IU] via SUBCUTANEOUS
  Filled 2018-04-04 (×4): qty 1

## 2018-04-04 MED ORDER — ATORVASTATIN CALCIUM 10 MG PO TABS
10.0000 mg | ORAL_TABLET | Freq: Every day | ORAL | Status: DC
  Administered 2018-04-04: 10 mg via ORAL
  Filled 2018-04-04: qty 1

## 2018-04-04 MED ORDER — TRAMADOL HCL 50 MG PO TABS
50.0000 mg | ORAL_TABLET | Freq: Four times a day (QID) | ORAL | Status: DC | PRN
  Administered 2018-04-04 – 2018-04-05 (×3): 50 mg via ORAL
  Filled 2018-04-04 (×3): qty 1

## 2018-04-04 MED ORDER — OMEGA-3-ACID ETHYL ESTERS 1 G PO CAPS
2.0000 g | ORAL_CAPSULE | Freq: Two times a day (BID) | ORAL | Status: DC
  Administered 2018-04-04: 2 g via ORAL
  Filled 2018-04-04: qty 2

## 2018-04-04 MED ORDER — SUCCINYLCHOLINE CHLORIDE 20 MG/ML IJ SOLN
INTRAMUSCULAR | Status: DC | PRN
  Administered 2018-04-04: 80 mg via INTRAVENOUS

## 2018-04-04 MED ORDER — AMLODIPINE BESYLATE 5 MG PO TABS
2.5000 mg | ORAL_TABLET | Freq: Every day | ORAL | Status: DC
  Administered 2018-04-04: 2.5 mg via ORAL
  Filled 2018-04-04: qty 1

## 2018-04-04 MED ORDER — CALCIUM CARBONATE ANTACID 500 MG PO CHEW
1.0000 | CHEWABLE_TABLET | Freq: Every day | ORAL | Status: DC
  Administered 2018-04-04: 200 mg via ORAL
  Filled 2018-04-04: qty 1

## 2018-04-04 MED ORDER — PROPOFOL 500 MG/50ML IV EMUL
INTRAVENOUS | Status: AC
  Filled 2018-04-04: qty 50

## 2018-04-04 MED ORDER — GLIMEPIRIDE 4 MG PO TABS
4.0000 mg | ORAL_TABLET | Freq: Every day | ORAL | Status: DC
  Administered 2018-04-05: 4 mg via ORAL
  Filled 2018-04-04: qty 1

## 2018-04-04 MED ORDER — DEXTROSE 50 % IV SOLN
12.5000 g | INTRAVENOUS | Status: AC
  Administered 2018-04-04: 12.5 g via INTRAVENOUS

## 2018-04-04 MED ORDER — FENTANYL CITRATE (PF) 100 MCG/2ML IJ SOLN
INTRAMUSCULAR | Status: AC
  Filled 2018-04-04: qty 2

## 2018-04-04 MED ORDER — INSULIN ASPART 100 UNIT/ML ~~LOC~~ SOLN
0.0000 [IU] | Freq: Three times a day (TID) | SUBCUTANEOUS | Status: DC
  Administered 2018-04-04: 3 [IU] via SUBCUTANEOUS
  Filled 2018-04-04: qty 1

## 2018-04-04 MED ORDER — PANTOPRAZOLE SODIUM 40 MG PO TBEC
40.0000 mg | DELAYED_RELEASE_TABLET | Freq: Two times a day (BID) | ORAL | Status: DC
  Administered 2018-04-04 (×2): 40 mg via ORAL
  Filled 2018-04-04 (×2): qty 1

## 2018-04-04 MED ORDER — PROMETHAZINE HCL 25 MG/ML IJ SOLN
INTRAMUSCULAR | Status: AC
  Filled 2018-04-04: qty 1

## 2018-04-04 MED ORDER — ISOSORBIDE MONONITRATE ER 30 MG PO TB24
90.0000 mg | ORAL_TABLET | Freq: Every day | ORAL | Status: DC
  Administered 2018-04-04: 90 mg via ORAL
  Filled 2018-04-04: qty 3

## 2018-04-04 MED ORDER — ONDANSETRON HCL 4 MG/2ML IJ SOLN
4.0000 mg | Freq: Once | INTRAMUSCULAR | Status: AC | PRN
  Administered 2018-04-04: 4 mg via INTRAVENOUS

## 2018-04-04 MED ORDER — FENTANYL CITRATE (PF) 100 MCG/2ML IJ SOLN: 25.0000 ug | INTRAMUSCULAR | Status: DC | PRN

## 2018-04-04 MED ORDER — ONDANSETRON HCL 4 MG/2ML IJ SOLN
INTRAMUSCULAR | Status: AC
  Filled 2018-04-04: qty 2

## 2018-04-04 MED ORDER — FAMOTIDINE 20 MG PO TABS
20.0000 mg | ORAL_TABLET | Freq: Two times a day (BID) | ORAL | Status: DC
  Administered 2018-04-04: 20 mg via ORAL
  Filled 2018-04-04: qty 1

## 2018-04-04 MED ORDER — ASPIRIN EC 81 MG PO TBEC: 81.0000 mg | DELAYED_RELEASE_TABLET | Freq: Every day | ORAL | Status: DC

## 2018-04-04 MED ORDER — ROCURONIUM 10MG/ML (10ML) SYRINGE FOR MEDFUSION PUMP - OPTIME
INTRAVENOUS | Status: DC | PRN
  Administered 2018-04-04: 3 mg via INTRAVENOUS

## 2018-04-04 MED ORDER — LISINOPRIL 2.5 MG PO TABS
2.5000 mg | ORAL_TABLET | Freq: Every day | ORAL | Status: DC
  Administered 2018-04-04: 2.5 mg via ORAL
  Filled 2018-04-04 (×2): qty 1

## 2018-04-04 MED ORDER — SODIUM CHLORIDE FLUSH 0.9 % IV SOLN
INTRAVENOUS | Status: AC
  Filled 2018-04-04: qty 10

## 2018-04-04 MED ORDER — HYDRALAZINE HCL 20 MG/ML IJ SOLN: 10.0000 mg | Freq: Four times a day (QID) | INTRAMUSCULAR | Status: DC | PRN

## 2018-04-04 MED ORDER — LIDOCAINE HCL (PF) 2 % IJ SOLN
INTRAMUSCULAR | Status: AC
  Filled 2018-04-04: qty 10

## 2018-04-04 MED ORDER — FENTANYL CITRATE (PF) 100 MCG/2ML IJ SOLN
INTRAMUSCULAR | Status: DC | PRN
  Administered 2018-04-04: 25 ug via INTRAVENOUS

## 2018-04-04 MED ORDER — INSULIN ASPART 100 UNIT/ML ~~LOC~~ SOLN: 0.0000 [IU] | Freq: Every day | SUBCUTANEOUS | Status: DC

## 2018-04-04 MED ORDER — ONDANSETRON HCL 4 MG/2ML IJ SOLN: 4.0000 mg | Freq: Four times a day (QID) | INTRAMUSCULAR | Status: DC | PRN

## 2018-04-04 MED ORDER — DEXTROSE 50 % IV SOLN
INTRAVENOUS | Status: AC
  Filled 2018-04-04: qty 50

## 2018-04-04 MED ORDER — RANOLAZINE ER 500 MG PO TB12
500.0000 mg | ORAL_TABLET | Freq: Two times a day (BID) | ORAL | Status: DC
  Administered 2018-04-04: 500 mg via ORAL
  Filled 2018-04-04 (×3): qty 1

## 2018-04-04 MED ORDER — MOMETASONE FURO-FORMOTEROL FUM 200-5 MCG/ACT IN AERO
2.0000 | INHALATION_SPRAY | Freq: Two times a day (BID) | RESPIRATORY_TRACT | Status: DC
  Administered 2018-04-04 – 2018-04-05 (×3): 2 via RESPIRATORY_TRACT
  Filled 2018-04-04: qty 8.8

## 2018-04-04 MED ORDER — SODIUM CHLORIDE 0.9 % IV SOLN
INTRAVENOUS | Status: DC
  Administered 2018-04-04 (×4): via INTRAVENOUS

## 2018-04-04 MED ORDER — TRAZODONE HCL 50 MG PO TABS
150.0000 mg | ORAL_TABLET | Freq: Every day | ORAL | Status: DC
  Administered 2018-04-04: 150 mg via ORAL
  Filled 2018-04-04: qty 1

## 2018-04-04 MED ORDER — PROMETHAZINE HCL 25 MG/ML IJ SOLN
6.2500 mg | Freq: Once | INTRAMUSCULAR | Status: AC
  Administered 2018-04-04: 6.25 mg via INTRAVENOUS

## 2018-04-04 MED ORDER — PROPOFOL 10 MG/ML IV BOLUS
INTRAVENOUS | Status: DC | PRN
  Administered 2018-04-04: 150 mg via INTRAVENOUS

## 2018-04-04 MED ORDER — ZINC SULFATE 220 (50 ZN) MG PO CAPS
220.0000 mg | ORAL_CAPSULE | Freq: Every day | ORAL | Status: DC
  Administered 2018-04-04: 220 mg via ORAL
  Filled 2018-04-04 (×2): qty 1

## 2018-04-04 NOTE — Anesthesia Post-op Follow-up Note (Signed)
Anesthesia QCDR form completed.        

## 2018-04-04 NOTE — Progress Notes (Signed)
Family Meeting Note  Advance Directive:yes  Today a meeting took place with the Patient and spouse.  The following clinical team members were present during this meeting:MD  The following were discussed:Patient's diagnosis: Coronary artery disease, diabetes, hypertension, asthma, sleep apnea, Patient's progosis: Unable to determine and Goals for treatment: Continue present management  Patient made it clear that in any adverse event he would like to have a trial of CPR, defibrillator was resuscitation medications but he would not like to be intubated or put on the ventilatory support.  His wife was present in the room during this discussion and she agrees with his wishes.  Additional follow-up to be provided: GI  Time spent during discussion:20 minutes  Vaughan Basta, MD

## 2018-04-04 NOTE — Anesthesia Procedure Notes (Deleted)
Performed by: Easter Kennebrew H, CRNA       

## 2018-04-04 NOTE — Progress Notes (Signed)
Phenergan given for continued nausea

## 2018-04-04 NOTE — Anesthesia Postprocedure Evaluation (Signed)
Anesthesia Post Note  Patient: Hendrixx Severin Peloso  Procedure(s) Performed: ESOPHAGOGASTRODUODENOSCOPY (EGD) (N/A )  Patient location during evaluation: Endoscopy Anesthesia Type: General Level of consciousness: awake and alert Pain management: pain level controlled Vital Signs Assessment: post-procedure vital signs reviewed and stable Respiratory status: spontaneous breathing, nonlabored ventilation, respiratory function stable and patient connected to nasal cannula oxygen Cardiovascular status: blood pressure returned to baseline and stable Postop Assessment: no apparent nausea or vomiting Anesthetic complications: no     Last Vitals:  Vitals:   04/04/18 1213 04/04/18 1230  BP: (!) 142/69 (!) 141/65  Pulse: 81 77  Resp: 20   Temp: 37.8 C 37.2 C  SpO2: 94% 94%    Last Pain:  Vitals:   04/04/18 1230  TempSrc: Oral  PainSc:                  Corban Kistler S

## 2018-04-04 NOTE — Care Management Obs Status (Signed)
MEDICARE OBSERVATION STATUS NOTIFICATION   Patient Details  Name: Roy [name] MRN: [id number] Date of Birth: 1932-11-29   Medicare Observation Status Notification Given:  Yes    Marcio Hoque A Doreen Garretson, RN 04/04/2018, 4:15 PM

## 2018-04-04 NOTE — Progress Notes (Signed)
Nausea a little better

## 2018-04-04 NOTE — Progress Notes (Signed)
zofran given for nausea  

## 2018-04-04 NOTE — H&P (Signed)
[facility] NAME: Roy [name]    MR#:  [id number]  DATE OF BIRTH:  March 04, 1933  DATE OF ADMISSION:  04/03/2018  PRIMARY CARE PHYSICIAN: Marin Olp, MD   REQUESTING/REFERRING PHYSICIAN: Owens Shark  CHIEF COMPLAINT:   Chief Complaint  Patient presents with  . Foreign Body    in throat     HISTORY OF PRESENT ILLNESS: Roy [name]  is a 85 y.o. male with a known history of asthma, coronary artery disease status post stent, community-acquired pneumonia, chronic kidney disease, diabetes type 2, gastroesophageal reflux disease, heart murmur, hyperlipidemia, hypertension, Lyme disease, sleep apnea-was eating a piece of chicken and wrongfully swallowed it without chewing much.  The pieces stuck in his esophagus in the mid chest he tried a lot to swallow it down but is not going down.  Concerned with this he came to emergency room. ER physician called on-call GI Dr. Marius Ditch, she suggested to keep on medical service for observation and she will do EGD early morning to pull out the piece of chicken. Patient denies any other complaints.  PAST MEDICAL HISTORY:   Past Medical History:  Diagnosis Date  . Anginal pain ([place])   . Arthritis    spine  . Asthma   . CAD (coronary artery disease)    s/p PCI of the D1 with cath in 2013 showing nonobstructive disease.  CP presumed due to small vessel disease.  [name] CAP (community acquired pneumonia) 12/26/2011  . Chronic kidney disease    renal insufficiency  . Diabetes mellitus, type 2 ([place])   . Fever 12/26/2011  . GERD (gastroesophageal reflux disease)   . Heart murmur   . Hyperlipidemia   . Hypertension   . Lyme disease    states had shakes that were thought to be parkinson's related but related to tick bite, states also RMSF  . Myocardial infarction ([place])   . Shortness of breath   . Sleep apnea    uses cpap    PAST SURGICAL HISTORY:  Past Surgical History:  Procedure Laterality Date  .  APPENDECTOMY    . CERVICAL LAMINECTOMY    . CHOLECYSTECTOMY    . KIDNEY STONE SURGERY    . LEFT HEART CATH N/A 02/15/2012   Procedure: LEFT HEART CATH;  Surgeon: Larey Dresser, MD;  Location: [facility] CATH LAB;  Service: Cardiovascular;  Laterality: N/A;    SOCIAL HISTORY:  Social History   Tobacco Use  . Smoking status: Former Smoker    Packs/day: 1.50    Years: 8.00    Pack years: 12.00    Types: Cigarettes    Last attempt to quit: 03/05/1958    Years since quitting: 60.1  . Smokeless tobacco: Never Used  Substance Use Topics  . Alcohol use: No    Alcohol/week: 0.0 standard drinks    FAMILY HISTORY:  Family History  Problem Relation Age of Onset  . ALS Father   . Asthma Daughter   . Cancer Sister        breast/liver    DRUG ALLERGIES:  Allergies  Allergen Reactions  . Codeine Sulfate Other (See Comments)    Chest pain  . Crestor [Rosuvastatin Calcium]   . Cephalexin Rash    REVIEW OF SYSTEMS:   CONSTITUTIONAL: No fever, fatigue or weakness.  EYES: No blurred or double vision.  EARS, NOSE, AND THROAT: No tinnitus or ear pain.  RESPIRATORY: No cough, shortness of breath, wheezing or hemoptysis.  CARDIOVASCULAR:  No chest pain, orthopnea, edema.  GASTROINTESTINAL: No nausea, vomiting, diarrhea or abdominal pain.  GENITOURINARY: No dysuria, hematuria.  ENDOCRINE: No polyuria, nocturia,  HEMATOLOGY: No anemia, easy bruising or bleeding SKIN: No rash or lesion. MUSCULOSKELETAL: No joint pain or arthritis.   NEUROLOGIC: No tingling, numbness, weakness.  PSYCHIATRY: No anxiety or depression.   MEDICATIONS AT HOME:  Prior to Admission medications   Medication Sig Start Date End Date Taking? Authorizing Provider  acetaminophen (TYLENOL) 325 MG tablet Take 650 mg by mouth every evening. Reported on 06/28/2015   Yes [provider]  albuterol (PROVENTIL HFA;VENTOLIN HFA) 108 (90 Base) MCG/ACT inhaler Inhale 2 puffs into the lungs every 4 (four) hours as needed  for wheezing or shortness of breath. 08/08/17  Yes Marin Olp, MD  amLODipine (NORVASC) 2.5 MG tablet Take 1 tablet (2.5 mg total) by mouth daily. 12/09/17  Yes Marin Olp, MD  aspirin EC 81 MG tablet Take 1 tablet (81 mg total) by mouth daily. 03/11/12  Yes Larey Dresser, MD  atorvastatin (LIPITOR) 10 MG tablet Take 1 tablet (10 mg total) by mouth daily. 04/09/17  Yes Marin Olp, MD  budesonide-formoterol [facility]) 160-4.5 MCG/ACT inhaler Inhale 2 puffs into the lungs 2 (two) times daily. 09/10/17  Yes Juanito Doom, MD  calcium-vitamin D (OSCAL WITH D) 500-200 MG-UNIT per tablet Take 1 tablet by mouth daily.   Yes [provider]  famotidine (PEPCID) 20 MG tablet Take 1 tablet (20 mg total) by mouth 2 (two) times daily. 01/24/18  Yes Marin Olp, MD  glimepiride (AMARYL) 4 MG tablet Take 1 tablet (4 mg total) by mouth daily with breakfast. 12/09/17  Yes Marin Olp, MD  isosorbide mononitrate (IMDUR) 30 MG 24 hr tablet TAKE 3 TABLETS BY MOUTH ONCE DAILY 01/01/18  Yes Marin Olp, MD  lisinopril (PRINIVIL,ZESTRIL) 5 MG tablet TAKE 1/2 (ONE-HALF) TABLET BY MOUTH ONCE DAILY 01/17/18  Yes Marin Olp, MD  metFORMIN (GLUCOPHAGE) 500 MG tablet TAKE 1/2 (ONE-HALF) TABLET BY MOUTH ONCE DAILY WITH BREAKFAST 03/10/18  Yes Marin Olp, MD  metoprolol succinate (TOPROL-XL) 50 MG 24 hr tablet TAKE 1 TABLET BY MOUTH ONCE DAILY (TAKE  WITH  OR  IMMEDIATELY  FOLLOWING  A  MEAL) 01/08/18  Yes Marin Olp, MD  nitroGLYCERIN (NITROSTAT) 0.4 MG SL tablet Place 1 tablet (0.4 mg total) under the tongue every 5 (five) minutes as needed for chest pain. 04/09/17  Yes Marin Olp, MD  Omega-3 Fatty Acids (FISH OIL PO) Take 2 capsules by mouth 2 (two) times daily.   Yes [provider]  OVER THE COUNTER MEDICATION Take 1 tablet by mouth at bedtime. Legatrim PM   Yes [provider]  Polyethyl Glycol-Propyl Glycol (SYSTANE OP) Place 1 drop  into both eyes 4 (four) times daily. Itchy/dry eye   Yes [provider]  potassium citrate (UROCIT-K) 10 MEQ (1080 MG) SR tablet Take 10 mEq by mouth Twice daily. 01/29/12  Yes [provider]  ranolazine (RANEXA) 500 MG 12 hr tablet Take 1 tablet (500 mg total) by mouth 2 (two) times daily. 05/28/17  Yes Turner, Eber Hong, MD  traMADol (ULTRAM) 50 MG tablet Take 1 tablet (50 mg total) by mouth every 6 (six) hours as needed for moderate pain (twice per day maximum). 12/09/17  Yes Marin Olp, MD  traZODone (DESYREL) 150 MG tablet Take 150 mg by mouth at bedtime.  01/16/18  Yes [provider]  zinc sulfate 220 MG capsule Take 220 mg by mouth daily.   Yes [provider]  ASSURE COMFORT LANCETS 30G MISC 1 each by Other route daily. 12/02/13   [provider]  glucose blood (ACCU-CHEK AVIVA PLUS) test strip Test blood glucose daily. 07/29/15   Marin Olp, MD  glucose blood test strip One Touch Ultra Test Strips 08/02/15   Marin Olp, MD      PHYSICAL EXAMINATION:   VITAL SIGNS: Blood pressure (!) 164/74, pulse 62, temperature 97.6 F (36.4 C), temperature source Oral, resp. rate 18, height 5\' 9"  (1.753 m), weight 79.4 kg, SpO2 97 %.  GENERAL:  85 y.o.-year-old patient lying in the bed with no acute distress.  EYES: Pupils equal, round, reactive to light and accommodation. No scleral icterus. Extraocular muscles intact.  HEENT: Head atraumatic, normocephalic. Oropharynx and nasopharynx clear.  NECK:  Supple, no jugular venous distention. No thyroid enlargement, no tenderness.  LUNGS: Normal breath sounds bilaterally, no wheezing, rales,rhonchi or crepitation. No use of accessory muscles of respiration.  CARDIOVASCULAR: S1, S2 normal. No murmurs, rubs, or gallops.  ABDOMEN: Soft, nontender, nondistended. Bowel sounds present. No organomegaly or mass.  EXTREMITIES: No pedal edema, cyanosis, or clubbing.  NEUROLOGIC: Cranial nerves II  through XII are intact. Muscle strength 5/5 in all extremities. Sensation intact. Gait not checked.  PSYCHIATRIC: The patient is alert and oriented x 3.  SKIN: No obvious rash, lesion, or ulcer.   LABORATORY PANEL:   CBC Recent Labs  Lab 04/03/18 2329  WBC 10.4  HGB 12.9*  HCT 39.3  PLT 136*  MCV 87.7  MCH 28.8  MCHC 32.8  RDW 13.6  LYMPHSABS 2.1  MONOABS 0.8  EOSABS 1.0*  BASOSABS 0.1   ------------------------------------------------------------------------------------------------------------------  Chemistries  Recent Labs  Lab 04/03/18 2329  NA 140  K 3.9  CL 107  CO2 28  GLUCOSE 98  BUN 29*  CREATININE 1.97*  CALCIUM 9.4  AST 22  ALT 22  ALKPHOS 60  BILITOT 1.0   ------------------------------------------------------------------------------------------------------------------ estimated creatinine clearance is 28.4 mL/min (A) (by C-G formula based on SCr of 1.97 mg/dL (H)). ------------------------------------------------------------------------------------------------------------------ No results for input(s): TSH, T4TOTAL, T3FREE, THYROIDAB in the last 72 hours.  Invalid input(s): FREET3   Coagulation profile No results for input(s): INR, PROTIME in the last 168 hours. ------------------------------------------------------------------------------------------------------------------- No results for input(s): DDIMER in the last 72 hours. -------------------------------------------------------------------------------------------------------------------  Cardiac Enzymes No results for input(s): CKMB, TROPONINI, MYOGLOBIN in the last 168 hours.  Invalid input(s): CK ------------------------------------------------------------------------------------------------------------------ Invalid input(s): POCBNP  ---------------------------------------------------------------------------------------------------------------  Urinalysis    Component Value  Date/Time   COLORURINE YELLOW 02/08/2015 2046   APPEARANCEUR CLOUDY (A) 02/08/2015 2046   LABSPEC 1.021 02/08/2015 2046   PHURINE 8.5 (H) 02/08/2015 2046   GLUCOSEU 100 (A) 02/08/2015 2046   HGBUR NEGATIVE 02/08/2015 2046   BILIRUBINUR NEGATIVE 02/08/2015 2046   KETONESUR 15 (A) 02/08/2015 2046   PROTEINUR 100 (A) 02/08/2015 2046   UROBILINOGEN 0.2 01/16/2009 2152   NITRITE NEGATIVE 02/08/2015 2046   LEUKOCYTESUR NEGATIVE 02/08/2015 2046     RADIOLOGY: Dg Neck Soft Tissue  Result Date: 04/03/2018 CLINICAL DATA:  Globus sensation of the throat. EXAM: NECK SOFT TISSUES - 1+ VIEW COMPARISON:  None. FINDINGS: There is no evidence of retropharyngeal soft tissue swelling or epiglottic enlargement. The cervical airway is unremarkable and no radio-opaque foreign body identified. No soft tissue emphysema is identified about the soft tissues of the neck. There is cervical spondylosis with slight reversal cervical  lordosis at C4-5. Osteophytes are noted anteriorly which may contribute to the globus sensation from C3 through C7 greatest at C5-6. IMPRESSION: 1. Unremarkable appearance of the soft tissues of the neck. 2. Cervical spondylosis with slight reversal of cervical lordosis at C4-5. 3. Prominent anterior osteophytes from C3 through C7, greatest across the C5-6 interspace and which may contribute to the globus sensation. Electronically Signed   By: Ashley Royalty M.D.   On: 04/03/2018 22:46    EKG: Orders placed or performed in visit on 05/02/16  . EKG 12-Lead    IMPRESSION AND PLAN:  *Impacted food in esophagus Keep n.p.o. Hold all oral medications. IV fluids for support. Check blood sugars every 4 hours without any coverage as he would be n.p.o. and has diabetes. GI is already contacted by ER and plan is to do EGD early morning.  *Coronary artery disease Hold all medications for now due to impacted food in esophagus. We may need to resume in the morning after EGD.  *Diabetes Hold  oral medications now. As mentioned above keep checking blood sugars to avoid hypoglycemia while n.p.o.  *Hypertension Hold oral medications. Hydralazine injection as needed.  *Asthma No exacerbation symptoms, continue inhalers.  All the records are reviewed and case discussed with ED provider. Management plans discussed with the patient, family and they are in agreement.  CODE STATUS: Limited code.    Code Status Orders  (From admission, onward)         Start     Ordered   04/04/18 0045  Limited resuscitation (code)  Continuous    Question Answer Comment  In the event of cardiac or respiratory ARREST: Initiate Code Blue, Call Rapid Response Yes   In the event of cardiac or respiratory ARREST: Perform CPR Yes   In the event of cardiac or respiratory ARREST: Perform Intubation/Mechanical Ventilation No   In the event of cardiac or respiratory ARREST: Use NIPPV/BiPAp only if indicated Yes   In the event of cardiac or respiratory ARREST: Administer ACLS medications if indicated Yes   In the event of cardiac or respiratory ARREST: Perform Defibrillation or Cardioversion if indicated Yes      04/04/18 0045        Code Status History    This patient has a current code status but no historical code status.     Patient's wife was present in the room during my visit.  TOTAL TIME TAKING CARE OF THIS PATIENT: 45 minutes.    Vaughan Basta M.D on 04/04/2018   Between 7am to 6pm - Pager - [phone]  After 6pm go to [email or web address] - password [email or web address] [facility]  Office  [phone]  CC: Primary care physician; Marin Olp, MD   Note: This dictation was prepared with Dragon dictation along with smaller phrase technology. Any transcriptional errors that result from this process are unintentional.

## 2018-04-04 NOTE — Progress Notes (Signed)
Ch visited pt from an OR. Pt and family were all gathered and all had a pleasant affect. Pt was mentioning how raspy his voice was after the endoscopy but so grateful of how he has been able to conquer various forms of infirmity and still be as active as he is. Ch provided a compassionate presence as the pt shared about a recent family member dying. Pt and wife became tearful as they reflected on the service and the good times they shared. Pt and wife rely on their faith and ACP planning with the daughter is important.    04/04/18 1130  Clinical Encounter Type  Visited With Patient and family together  Visit Type Psychological support;Spiritual support;Social support;Post-op  Spiritual Encounters  Spiritual Needs Emotional;Grief support  Stress Factors  Patient Stress Factors Exhausted;Other (Comment);Health changes  Advance Directives (For Healthcare)  Would patient like information on creating a medical advance directive? Yes (Inpatient - patient requests chaplain consult to create a medical advance directive)  [place] Directives  Does Patient Have a Mental Health Advance Directive? No  Would patient like information on creating a mental health advance directive? Yes (Inpatient - patient requests chaplain consult to create a mental health advance directive)

## 2018-04-04 NOTE — Consult Note (Signed)
Roy Darby, MD [address]  [place]  [place], [place] [address]  Main: [phone]  Fax: [phone] Pager: [phone]   Consultation  Referring Provider:     No ref. provider found Primary Care Physician:  Marin Olp, MD Primary Gastroenterologist: None         Reason for Consultation:     Food bolus impaction  Date of Admission:  04/03/2018 Date of Consultation:  04/04/2018         HPI:   Roy [name] is a 85 y.o. male had swallowed chicken piece yesterday around 2pm while watching TV. He felt it got stuck in his upper chest and did not pass down. He came to ER last night around 10pm, tried glucagon and coke. Didn't work. Soft tissue neck was normal. He was able to swallow some of his secretions and did not have difficulty breathing. I was called around 11pm. Suggested him to get admitted under observation with plan to do EGD in early morning. Since he was medically stable.   Pt had similar episodes of dysphagia in the past but not food impaction. He had EGD in 1980s, told he has hernia. He takes pepcid at home. He denies heart burn. He has chronic mild thrombocytopenia since 2008 of unclear etiology. No know liver disease   NSAIDs: none  Antiplts/Anticoagulants/Anti thrombotics: none  GI Procedures: EGD in 1980s  Past Medical History:  Diagnosis Date  . Anginal pain ([place])   . Arthritis    spine  . Asthma   . CAD (coronary artery disease)    s/p PCI of the D1 with cath in 2013 showing nonobstructive disease.  CP presumed due to small vessel disease.  [name] CAP (community acquired pneumonia) 12/26/2011  . Chronic kidney disease    renal insufficiency  . Diabetes mellitus, type 2 ([place])   . Fever 12/26/2011  . GERD (gastroesophageal reflux disease)   . Heart murmur   . Hyperlipidemia   . Hypertension   . Lyme disease    states had shakes that were thought to be parkinson's related but related to tick bite, states also RMSF  . Myocardial  infarction ([place])   . Shortness of breath   . Sleep apnea    uses cpap    Past Surgical History:  Procedure Laterality Date  . APPENDECTOMY    . CERVICAL LAMINECTOMY    . CHOLECYSTECTOMY    . KIDNEY STONE SURGERY    . LEFT HEART CATH N/A 02/15/2012   Procedure: LEFT HEART CATH;  Surgeon: Larey Dresser, MD;  Location: [facility] CATH LAB;  Service: Cardiovascular;  Laterality: N/A;    Prior to Admission medications   Medication Sig Start Date End Date Taking? Authorizing Provider  acetaminophen (TYLENOL) 325 MG tablet Take 650 mg by mouth every evening. Reported on 06/28/2015   Yes [provider]  albuterol (PROVENTIL HFA;VENTOLIN HFA) 108 (90 Base) MCG/ACT inhaler Inhale 2 puffs into the lungs every 4 (four) hours as needed for wheezing or shortness of breath. 08/08/17  Yes Marin Olp, MD  amLODipine (NORVASC) 2.5 MG tablet Take 1 tablet (2.5 mg total) by mouth daily. 12/09/17  Yes Marin Olp, MD  aspirin EC 81 MG tablet Take 1 tablet (81 mg total) by mouth daily. 03/11/12  Yes Larey Dresser, MD  atorvastatin (LIPITOR) 10 MG tablet Take 1 tablet (10 mg total) by mouth daily. 04/09/17  Yes Marin Olp, MD  budesonide-formoterol [facility]) 160-4.5  MCG/ACT inhaler Inhale 2 puffs into the lungs 2 (two) times daily. 09/10/17  Yes Juanito Doom, MD  calcium-vitamin D (OSCAL WITH D) 500-200 MG-UNIT per tablet Take 1 tablet by mouth daily.   Yes [provider]  famotidine (PEPCID) 20 MG tablet Take 1 tablet (20 mg total) by mouth 2 (two) times daily. 01/24/18  Yes Marin Olp, MD  glimepiride (AMARYL) 4 MG tablet Take 1 tablet (4 mg total) by mouth daily with breakfast. 12/09/17  Yes Marin Olp, MD  isosorbide mononitrate (IMDUR) 30 MG 24 hr tablet TAKE 3 TABLETS BY MOUTH ONCE DAILY 01/01/18  Yes Marin Olp, MD  lisinopril (PRINIVIL,ZESTRIL) 5 MG tablet TAKE 1/2 (ONE-HALF) TABLET BY MOUTH ONCE DAILY 01/17/18  Yes Marin Olp, MD  metFORMIN  (GLUCOPHAGE) 500 MG tablet TAKE 1/2 (ONE-HALF) TABLET BY MOUTH ONCE DAILY WITH BREAKFAST 03/10/18  Yes Marin Olp, MD  metoprolol succinate (TOPROL-XL) 50 MG 24 hr tablet TAKE 1 TABLET BY MOUTH ONCE DAILY (TAKE  WITH  OR  IMMEDIATELY  FOLLOWING  A  MEAL) 01/08/18  Yes Marin Olp, MD  nitroGLYCERIN (NITROSTAT) 0.4 MG SL tablet Place 1 tablet (0.4 mg total) under the tongue every 5 (five) minutes as needed for chest pain. 04/09/17  Yes Marin Olp, MD  Omega-3 Fatty Acids (FISH OIL PO) Take 2 capsules by mouth 2 (two) times daily.   Yes [provider]  OVER THE COUNTER MEDICATION Take 1 tablet by mouth at bedtime. Legatrim PM   Yes [provider]  Polyethyl Glycol-Propyl Glycol (SYSTANE OP) Place 1 drop into both eyes 4 (four) times daily. Itchy/dry eye   Yes [provider]  potassium citrate (UROCIT-K) 10 MEQ (1080 MG) SR tablet Take 10 mEq by mouth Twice daily. 01/29/12  Yes [provider]  ranolazine (RANEXA) 500 MG 12 hr tablet Take 1 tablet (500 mg total) by mouth 2 (two) times daily. 05/28/17  Yes Turner, Eber Hong, MD  traMADol (ULTRAM) 50 MG tablet Take 1 tablet (50 mg total) by mouth every 6 (six) hours as needed for moderate pain (twice per day maximum). 12/09/17  Yes Marin Olp, MD  traZODone (DESYREL) 150 MG tablet Take 150 mg by mouth at bedtime.  01/16/18  Yes [provider]  zinc sulfate 220 MG capsule Take 220 mg by mouth daily.   Yes [provider]  ASSURE COMFORT LANCETS 30G MISC 1 each by Other route daily. 12/02/13   [provider]  glucose blood (ACCU-CHEK AVIVA PLUS) test strip Test blood glucose daily. 07/29/15   Marin Olp, MD  glucose blood test strip One Touch Ultra Test Strips 08/02/15   Marin Olp, MD    Current Facility-Administered Medications:  .  0.9 %  sodium chloride infusion, , Intravenous, Continuous, Vaughan Basta, MD, Last Rate: 75 mL/hr at 04/04/18 0358 .   dextrose 50 % solution, , , ,  .  [MAR Hold] heparin injection 5,000 Units, 5,000 Units, Subcutaneous, Q8H, Vaughan Basta, MD, 5,000 Units at 04/04/18 0622 .  [MAR Hold] hydrALAZINE (APRESOLINE) injection 10 mg, 10 mg, Intravenous, Q6H PRN, Vaughan Basta, MD  Family History  Problem Relation Age of Onset  . ALS Father   . Asthma Daughter   . Cancer Sister        breast/liver     Social History   Tobacco Use  . Smoking status: Former Smoker    Packs/day: 1.50    Years:  8.00    Pack years: 12.00    Types: Cigarettes    Last attempt to quit: 03/05/1958    Years since quitting: 60.1  . Smokeless tobacco: Never Used  Substance Use Topics  . Alcohol use: No    Alcohol/week: 0.0 standard drinks  . Drug use: No    Allergies as of 04/03/2018 - Review Complete 04/03/2018  Allergen Reaction Noted  . Codeine sulfate Other (See Comments)   . Crestor [rosuvastatin calcium]  03/01/2018  . Cephalexin Rash     Review of Systems:    All systems reviewed and negative except where noted in HPI.   Physical Exam:  Vital signs in last 24 hours: Temp:  [97.4 F (36.3 C)-98.3 F (36.8 C)] 98.3 F (36.8 C) (01/31 0432) Pulse Rate:  [62-70] 68 (01/31 0432) Resp:  [18-20] 20 (01/31 0432) BP: (141-184)/(68-93) 141/68 (01/31 0432) SpO2:  [92 %-100 %] 92 % (01/31 0432) Weight:  [79.4 kg-79.8 kg] 79.8 kg (01/31 0106) Last BM Date: 04/03/18 General:   Pleasant, cooperative in NAD Head:  Normocephalic and atraumatic. Eyes:   No icterus.   Conjunctiva pink. PERRLA. Ears:  Normal auditory acuity. Neck:  Supple; no masses or thyroidomegaly Lungs: Respirations even and unlabored. Lungs clear to auscultation bilaterally.   No wheezes, crackles, or rhonchi.  Heart:  Regular rate and rhythm;  Without murmur, clicks, rubs or gallops Abdomen:  Soft, nondistended, nontender. Normal bowel sounds. No appreciable masses or hepatomegaly.  No rebound or guarding.  Rectal:  Not  performed. Msk:  Symmetrical without gross deformities.  Strength normal  Extremities:  Without edema, cyanosis or clubbing. Neurologic:  Alert and oriented x3;  grossly normal neurologically. Skin:  Intact without significant lesions or rashes. Psych:  Alert and cooperative. Normal affect.  LAB RESULTS: CBC Latest Ref Rng & Units 04/03/2018 12/09/2017 08/08/2017  WBC 4.0 - 10.5 K/uL 10.4 7.3 5.7  Hemoglobin 13.0 - 17.0 g/dL 12.9(L) 12.3(L) 12.1(L)  Hematocrit 39.0 - 52.0 % 39.3 35.9(L) 35.5(L)  Platelets 150 - 400 K/uL 136(L) 145.0(L) 137.0(L)    BMET BMP Latest Ref Rng & Units 04/03/2018 12/09/2017 08/08/2017  Glucose 70 - 99 mg/dL 98 151(H) 161(H)  BUN 8 - 23 mg/dL 29(H) 27(H) 27(H)  Creatinine 0.61 - 1.24 mg/dL 1.97(H) 1.81(H) 1.87(H)  Sodium 135 - 145 mmol/L 140 138 141  Potassium 3.5 - 5.1 mmol/L 3.9 4.3 4.5  Chloride 98 - 111 mmol/L 107 102 106  CO2 22 - 32 mmol/L _0 Calcium 8.9 - 10.3 mg/dL 9.4 9.3 9.3    LFT Hepatic Function Latest Ref Rng & Units 04/03/2018 08/08/2017 05/09/2017  Total Protein 6.5 - 8.1 g/dL 7.7 6.3 -  Albumin 3.5 - 5.0 g/dL 4.9 4.2 -  AST 15 - 41 U/L 22 14 -  ALT 0 - 44 U/L _1 Alk Phosphatase 38 - 126 U/L 60 71 -  Total Bilirubin 0.3 - 1.2 mg/dL 1.0 0.5 -  Bilirubin, Direct 0.0 - 0.3 mg/dL - - -     STUDIES: Dg Neck Soft Tissue  Result Date: 04/03/2018 CLINICAL DATA:  Globus sensation of the throat. EXAM: NECK SOFT TISSUES - 1+ VIEW COMPARISON:  None. FINDINGS: There is no evidence of retropharyngeal soft tissue swelling or epiglottic enlargement. The cervical airway is unremarkable and no radio-opaque foreign body identified. No soft tissue emphysema is identified about the soft tissues of the neck. There is cervical spondylosis with slight reversal cervical lordosis at C4-5. Osteophytes are  noted anteriorly which may contribute to the globus sensation from C3 through C7 greatest at C5-6. IMPRESSION: 1. Unremarkable appearance of the soft  tissues of the neck. 2. Cervical spondylosis with slight reversal of cervical lordosis at C4-5. 3. Prominent anterior osteophytes from C3 through C7, greatest across the C5-6 interspace and which may contribute to the globus sensation. Electronically Signed   By: Ashley Royalty M.D.   On: 04/03/2018 22:46      Impression / Plan:   Roy [name] is a 85 y.o. male with h/o hiatal hernia, intermittent dysphagia to solids presented with food bolus impaction after eating chicken yesterday  Proceed with EGD   I have discussed alternative options, risks & benefits,  which include, but are not limited to, bleeding, infection, perforation,respiratory complication & drug reaction.  The patient agrees with this plan & written consent will be obtained.    Thank you for involving me in the care of this patient.      LOS: 0 days   Sherri Sear, MD  04/04/2018, 8:07 AM   Note: This dictation was prepared with Dragon dictation along with smaller phrase technology. Any transcriptional errors that result from this process are unintentional.

## 2018-04-04 NOTE — Op Note (Signed)
[facility] Patient Name: Roy [name] Procedure Date: 04/04/2018 7:40 AM MRN: [id number] Account #: [id number] Date of Birth: May 31, 1932 Admit Type: Inpatient Age: 85 Room: [facility] ENDO ROOM 2 Gender: Male Note Status: Finalized Procedure:            Upper GI endoscopy Indications:          Foreign body in the esophagus Providers:            Lin Landsman MD, MD Referring MD:         Brayton Mars. Hunter MD (Referring MD) Medicines:            Monitored Anesthesia Care, General Anesthesia Complications:        No immediate complications. Estimated blood loss:                        Minimal. Procedure:            Pre-Anesthesia Assessment:                       - Prior to the procedure, a History and Physical was                        performed, and patient medications and allergies were                        reviewed. The patient is competent. The risks and                        benefits of the procedure and the sedation options and                        risks were discussed with the patient. All questions                        were answered and informed consent was obtained.                        Patient identification and proposed procedure were                        verified by the physician, the nurse, the                        anesthesiologist, the anesthetist and the technician in                        the pre-procedure area in the procedure room in the                        endoscopy suite. Mental Status Examination: alert and                        oriented. Airway Examination: normal oropharyngeal                        airway and neck mobility. Respiratory Examination:                        clear to auscultation. CV Examination: normal.  Prophylactic Antibiotics: The patient does not require                        prophylactic antibiotics. Prior Anticoagulants: The                        patient has taken  no previous anticoagulant or                        antiplatelet agents. ASA Grade Assessment: III - A                        patient with severe systemic disease. After reviewing                        the risks and benefits, the patient was deemed in                        satisfactory condition to undergo the procedure. The                        anesthesia plan was to use general anesthesia.                        Immediately prior to administration of medications, the                        patient was re-assessed for adequacy to receive                        sedatives. The heart rate, respiratory rate, oxygen                        saturations, blood pressure, adequacy of pulmonary                        ventilation, and response to care were monitored                        throughout the procedure. The physical status of the                        patient was re-assessed after the procedure.                       After obtaining informed consent, the endoscope was                        passed under direct vision. Throughout the procedure,                        the patient's blood pressure, pulse, and oxygen                        saturations were monitored continuously. The Endoscope                        was introduced through the mouth, and advanced to the  second part of duodenum. The upper GI endoscopy was                        accomplished without difficulty. The patient tolerated                        the procedure well. Findings:      Food was found in the lower third of the esophagus. Removal of food was       accomplished. Biopsies were obtained from the proximal and distal       esophagus with cold forceps for histology of suspected eosinophilic       esophagitis. There was no evidence of stricture, passed scope without       any resistance      Multiple dispersed, diminutive non-bleeding erosions were found in the       gastric antrum. There  were stigmata of recent bleeding.      The cardia and gastric fundus were normal on retroflexion.      The duodenal bulb and second portion of the duodenum were normal. Impression:           - Food in the lower third of the esophagus. Biopsied.                        Removal was successful.                       - Non-bleeding erosive gastropathy.                       - Normal duodenal bulb and second portion of the                        duodenum. Recommendation:       - Return patient to hospital ward for possible                        discharge same day.                       - Cardiac diet today.                       - Continue present medications.                       - Use Prilosec (omeprazole) 20 mg PO BID for 3 months.                       - Await pathology results.                       - Return to my office in 1 month. Procedure Code(s):    --- Professional ---                       [phone], Esophagogastroduodenoscopy, flexible, transoral;                        with removal of foreign body(s)                       43239, Esophagogastroduodenoscopy, flexible, transoral;  with biopsy, single or multiple Diagnosis Code(s):    --- Professional ---                       [phone], Food in esophagus causing other injury,                        initial encounter                       K31.89, Other diseases of stomach and duodenum                       T18.108A, Unspecified foreign body in esophagus causing                        other injury, initial encounter CPT copyright 2018 American Medical Association. All rights reserved. The codes documented in this report are preliminary and upon coder review may  be revised to meet current compliance requirements. Dr. Ulyess Mort Lin Landsman MD, MD 04/04/2018 8:32:05 AM This report has been signed electronically. Number of Addenda: 0 Note Initiated On: 04/04/2018 7:40 AM      [facility]

## 2018-04-04 NOTE — Progress Notes (Signed)
[place] at [place] NAME: Naksh Radi    MR#:  [id number]  DATE OF BIRTH:  [birth year]  SUBJECTIVE:  CHIEF COMPLAINT: Patient had EGD done following that he was nauseous denies any vomiting or abdominal pain.  Wife and daughter at bedside.  REVIEW OF SYSTEMS:  CONSTITUTIONAL: No fever, fatigue or weakness.  EYES: No blurred or double vision.  EARS, NOSE, AND THROAT: No tinnitus or ear pain.  RESPIRATORY: No cough, shortness of breath, wheezing or hemoptysis.  CARDIOVASCULAR: No chest pain, orthopnea, edema.  GASTROINTESTINAL: Reports nausea, denies vomiting, diarrhea or abdominal pain.  GENITOURINARY: No dysuria, hematuria.  ENDOCRINE: No polyuria, nocturia,  HEMATOLOGY: No anemia, easy bruising or bleeding SKIN: No rash or lesion. MUSCULOSKELETAL: No joint pain or arthritis.   NEUROLOGIC: No tingling, numbness, weakness.  PSYCHIATRY: No anxiety or depression.   DRUG ALLERGIES:   Allergies  Allergen Reactions  . Codeine Sulfate Other (See Comments)    Chest pain  . Crestor [Rosuvastatin Calcium]   . Cephalexin Rash    VITALS:  Blood pressure (!) 141/65, pulse 77, temperature 98.9 F (37.2 C), temperature source Oral, resp. rate 20, height 5\' 9"  (1.753 m), weight 79.8 kg, SpO2 94 %.  PHYSICAL EXAMINATION:  GENERAL:  85 y.o.-year-old patient lying in the bed with no acute distress.  EYES: Pupils equal, round, reactive to light and accommodation. No scleral icterus. Extraocular muscles intact.  HEENT: Head atraumatic, normocephalic. Oropharynx and nasopharynx clear.  NECK:  Supple, no jugular venous distention. No thyroid enlargement, no tenderness.  LUNGS: Normal breath sounds bilaterally, no wheezing, rales,rhonchi or crepitation. No use of accessory muscles of respiration.  CARDIOVASCULAR: S1, S2 normal. No murmurs, rubs, or gallops.  ABDOMEN: Soft, nontender, nondistended. Bowel sounds present.  EXTREMITIES: No pedal  edema, cyanosis, or clubbing.  NEUROLOGIC: Awake, alert and oriented x3. Sensation intact. Gait not checked.  PSYCHIATRIC: The patient is alert and oriented x 3.  SKIN: No obvious rash, lesion, or ulcer.    LABORATORY PANEL:   CBC Recent Labs  Lab 04/03/18 2329  WBC 10.4  HGB 12.9*  HCT 39.3  PLT 136*   ------------------------------------------------------------------------------------------------------------------  Chemistries  Recent Labs  Lab 04/03/18 2329  NA 140  K 3.9  CL 107  CO2 28  GLUCOSE 98  BUN 29*  CREATININE 1.97*  CALCIUM 9.4  AST 22  ALT 22  ALKPHOS 60  BILITOT 1.0   ------------------------------------------------------------------------------------------------------------------  Cardiac Enzymes No results for input(s): TROPONINI in the last 168 hours. ------------------------------------------------------------------------------------------------------------------  RADIOLOGY:  Dg Neck Soft Tissue  Result Date: 04/03/2018 CLINICAL DATA:  Globus sensation of the throat. EXAM: NECK SOFT TISSUES - 1+ VIEW COMPARISON:  None. FINDINGS: There is no evidence of retropharyngeal soft tissue swelling or epiglottic enlargement. The cervical airway is unremarkable and no radio-opaque foreign body identified. No soft tissue emphysema is identified about the soft tissues of the neck. There is cervical spondylosis with slight reversal cervical lordosis at C4-5. Osteophytes are noted anteriorly which may contribute to the globus sensation from C3 through C7 greatest at C5-6. IMPRESSION: 1. Unremarkable appearance of the soft tissues of the neck. 2. Cervical spondylosis with slight reversal of cervical lordosis at C4-5. 3. Prominent anterior osteophytes from C3 through C7, greatest across the C5-6 interspace and which may contribute to the globus sensation. Electronically Signed   By: Ashley Royalty M.D.   On: 04/03/2018 22:46    EKG:   Orders placed or performed in  visit on 05/02/16  . EKG 12-Lead    ASSESSMENT AND PLAN:     *Impacted food in esophagus Status post EGD, the impacted food was removed from the esophagus Nonbleeding erosive gastropathy GI is recommending to continue PPI twice a day for the next 3 months and outpatient follow-up  *Intractable nausea denies any vomiting Antiemetics and gentle hydration with IV fluids and supportive treatment  *Coronary artery disease Resume home medications patient is asymptomatic  *Diabetes Resume oral medications now. Sliding scale insulin with CBGs  *Hypertension Resume oral medications. Hydralazine injection as needed.  *Asthma No exacerbation symptoms, continue inhalers.    All the records are reviewed and case discussed with Care Management/Social Workerr. Management plans discussed with the patient, family and they are in agreement.  CODE STATUS:   TOTAL TIME TAKING CARE OF THIS PATIENT: 36  minutes.   POSSIBLE D/C IN 1 DAYS, DEPENDING ON CLINICAL CONDITION.  Note: This dictation was prepared with Dragon dictation along with smaller phrase technology. Any transcriptional errors that result from this process are unintentional.   Nicholes Mango M.D on 04/04/2018 at 3:24 PM  Between 7am to 6pm - Pager - [phone] After 6pm go to [email or web address] - password [email or web address] [facility]  [place]  Office  [phone]  CC: Primary care physician; Marin Olp, MD

## 2018-04-04 NOTE — Progress Notes (Signed)
Spitting up a lot of secretions   Slightly blood tinged

## 2018-04-04 NOTE — H&P (Signed)
Cephas Darby, MD [address]  [place]  [place], [place] [address]  Main: [phone]  Fax: [phone] Pager: [phone]  Primary Care Physician:  Marin Olp, MD Primary Gastroenterologist:  Dr. Cephas Darby  Pre-Procedure History & Physical: HPI:  Roy [name] is a 85 y.o. male is here for an endoscopy.   Past Medical History:  Diagnosis Date  . Anginal pain ([place])   . Arthritis    spine  . Asthma   . CAD (coronary artery disease)    s/p PCI of the D1 with cath in 2013 showing nonobstructive disease.  CP presumed due to small vessel disease.  [name] CAP (community acquired pneumonia) 12/26/2011  . Chronic kidney disease    renal insufficiency  . Diabetes mellitus, type 2 ([place])   . Fever 12/26/2011  . GERD (gastroesophageal reflux disease)   . Heart murmur   . Hyperlipidemia   . Hypertension   . Lyme disease    states had shakes that were thought to be parkinson's related but related to tick bite, states also RMSF  . Myocardial infarction ([place])   . Shortness of breath   . Sleep apnea    uses cpap    Past Surgical History:  Procedure Laterality Date  . APPENDECTOMY    . CERVICAL LAMINECTOMY    . CHOLECYSTECTOMY    . KIDNEY STONE SURGERY    . LEFT HEART CATH N/A 02/15/2012   Procedure: LEFT HEART CATH;  Surgeon: Larey Dresser, MD;  Location: [facility] CATH LAB;  Service: Cardiovascular;  Laterality: N/A;    Prior to Admission medications   Medication Sig Start Date End Date Taking? Authorizing Provider  acetaminophen (TYLENOL) 325 MG tablet Take 650 mg by mouth every evening. Reported on 06/28/2015   Yes [provider]  albuterol (PROVENTIL HFA;VENTOLIN HFA) 108 (90 Base) MCG/ACT inhaler Inhale 2 puffs into the lungs every 4 (four) hours as needed for wheezing or shortness of breath. 08/08/17  Yes Marin Olp, MD  amLODipine (NORVASC) 2.5 MG tablet Take 1 tablet (2.5 mg total) by mouth daily. 12/09/17  Yes Marin Olp, MD  aspirin EC  81 MG tablet Take 1 tablet (81 mg total) by mouth daily. 03/11/12  Yes Larey Dresser, MD  atorvastatin (LIPITOR) 10 MG tablet Take 1 tablet (10 mg total) by mouth daily. 04/09/17  Yes Marin Olp, MD  budesonide-formoterol [facility]) 160-4.5 MCG/ACT inhaler Inhale 2 puffs into the lungs 2 (two) times daily. 09/10/17  Yes Juanito Doom, MD  calcium-vitamin D (OSCAL WITH D) 500-200 MG-UNIT per tablet Take 1 tablet by mouth daily.   Yes [provider]  famotidine (PEPCID) 20 MG tablet Take 1 tablet (20 mg total) by mouth 2 (two) times daily. 01/24/18  Yes Marin Olp, MD  glimepiride (AMARYL) 4 MG tablet Take 1 tablet (4 mg total) by mouth daily with breakfast. 12/09/17  Yes Marin Olp, MD  isosorbide mononitrate (IMDUR) 30 MG 24 hr tablet TAKE 3 TABLETS BY MOUTH ONCE DAILY 01/01/18  Yes Marin Olp, MD  lisinopril (PRINIVIL,ZESTRIL) 5 MG tablet TAKE 1/2 (ONE-HALF) TABLET BY MOUTH ONCE DAILY 01/17/18  Yes Marin Olp, MD  metFORMIN (GLUCOPHAGE) 500 MG tablet TAKE 1/2 (ONE-HALF) TABLET BY MOUTH ONCE DAILY WITH BREAKFAST 03/10/18  Yes Marin Olp, MD  metoprolol succinate (TOPROL-XL) 50 MG 24 hr tablet TAKE 1 TABLET BY MOUTH ONCE DAILY (TAKE  WITH  OR  IMMEDIATELY  FOLLOWING  A  MEAL) 01/08/18  Yes Marin Olp, MD  nitroGLYCERIN (NITROSTAT) 0.4 MG SL tablet Place 1 tablet (0.4 mg total) under the tongue every 5 (five) minutes as needed for chest pain. 04/09/17  Yes Marin Olp, MD  Omega-3 Fatty Acids (FISH OIL PO) Take 2 capsules by mouth 2 (two) times daily.   Yes [provider]  OVER THE COUNTER MEDICATION Take 1 tablet by mouth at bedtime. Legatrim PM   Yes [provider]  Polyethyl Glycol-Propyl Glycol (SYSTANE OP) Place 1 drop into both eyes 4 (four) times daily. Itchy/dry eye   Yes [provider]  potassium citrate (UROCIT-K) 10 MEQ (1080 MG) SR tablet Take 10 mEq by mouth Twice daily. 01/29/12  Yes [provider]  ranolazine (RANEXA) 500 MG 12 hr tablet Take 1 tablet (500 mg total) by mouth 2 (two) times daily. 05/28/17  Yes Turner, Eber Hong, MD  traMADol (ULTRAM) 50 MG tablet Take 1 tablet (50 mg total) by mouth every 6 (six) hours as needed for moderate pain (twice per day maximum). 12/09/17  Yes Marin Olp, MD  traZODone (DESYREL) 150 MG tablet Take 150 mg by mouth at bedtime.  01/16/18  Yes [provider]  zinc sulfate 220 MG capsule Take 220 mg by mouth daily.   Yes [provider]  ASSURE COMFORT LANCETS 30G MISC 1 each by Other route daily. 12/02/13   [provider]  glucose blood (ACCU-CHEK AVIVA PLUS) test strip Test blood glucose daily. 07/29/15   Marin Olp, MD  glucose blood test strip One Touch Ultra Test Strips 08/02/15   Marin Olp, MD    Allergies as of 04/03/2018 - Review Complete 04/03/2018  Allergen Reaction Noted  . Codeine sulfate Other (See Comments)   . Crestor [rosuvastatin calcium]  03/01/2018  . Cephalexin Rash     Family History  Problem Relation Age of Onset  . ALS Father   . Asthma Daughter   . Cancer Sister        breast/liver    Social History   Socioeconomic History  . Marital status: Married    Spouse name: Not on file  . Number of children: Not on file  . Years of education: Not on file  . Highest education level: Not on file  Occupational History  . Not on file  Social Needs  . Financial resource strain: Not on file  . Food insecurity:    Worry: Not on file    Inability: Not on file  . Transportation needs:    Medical: Not on file    Non-medical: Not on file  Tobacco Use  . Smoking status: Former Smoker    Packs/day: 1.50    Years: 8.00    Pack years: 12.00    Types: Cigarettes    Last attempt to quit: 03/05/1958    Years since quitting: 60.1  . Smokeless tobacco: Never Used  Substance and Sexual Activity  . Alcohol use: No    Alcohol/week: 0.0 standard drinks  . Drug use: No  .  Sexual activity: Not Currently  Lifestyle  . Physical activity:    Days per week: Not on file    Minutes per session: Not on file  . Stress: Not on file  Relationships  . Social connections:    Talks on phone: Not on file    Gets together: Not on file    Attends religious service: Not on file    Active  member of club or organization: Not on file    Attends meetings of clubs or organizations: Not on file    Relationship status: Not on file  . Intimate partner violence:    Fear of current or ex partner: Not on file    Emotionally abused: Not on file    Physically abused: Not on file    Forced sexual activity: Not on file  Other Topics Concern  . Not on file  Social History Narrative   Married (wife patient of Dr. Yong Channel). 3 children. 4 grandchildren. 3 greatgrandchildren.       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 Neck:  Supple; no masses or thyromegaly. Lungs:  Clear throughout to auscultation.    Heart:  Regular rate and rhythm. Abdomen:  Soft, nontender and nondistended. Normal bowel sounds, without guarding, and without rebound.   Neurologic:  Alert and  oriented x4;  grossly normal neurologically.  Impression/Plan: Roy [name] is here for an endoscopy to be performed for food impaction  Risks, benefits, limitations, and alternatives regarding  endoscopy have been reviewed with the patient.  Questions have been answered.  All parties agreeable.   Sherri Sear, MD  04/04/2018, 7:57 AM

## 2018-04-04 NOTE — Progress Notes (Signed)
Nausea better.

## 2018-04-04 NOTE — Transfer of Care (Signed)
Immediate Anesthesia Transfer of Care Note  Patient: Roy [name]  Procedure(s) Performed: ESOPHAGOGASTRODUODENOSCOPY (EGD) (N/A )  Patient Location: PACU  Anesthesia Type:General  Level of Consciousness: awake, alert  and oriented  Airway & Oxygen Therapy: Patient connected to nasal cannula oxygen  Post-op Assessment: Report given to RN and Post -op Vital signs reviewed and stable  Post vital signs: Reviewed and stable  Last Vitals:  Vitals Value Taken Time  BP 149/63 04/04/2018  8:41 AM  Temp 36.8 C 04/04/2018  8:40 AM  Pulse 86 04/04/2018  8:51 AM  Resp 24 04/04/2018  8:51 AM  SpO2 98 % 04/04/2018  8:51 AM  Vitals shown include unvalidated device data.  Last Pain:  Vitals:   04/04/18 0840  TempSrc:   PainSc: 0-No pain         Complications: No apparent anesthesia complications

## 2018-04-04 NOTE — Anesthesia Procedure Notes (Addendum)
Procedure Name: Intubation Date/Time: 04/04/2018 8:11 AM Performed by: Babs Sciara, CRNA Pre-anesthesia Checklist: Patient identified, Emergency Drugs available, Suction available, Patient being monitored and Timeout performed Patient Re-evaluated:Patient Re-evaluated prior to induction Oxygen Delivery Method: Circle system utilized Preoxygenation: Pre-oxygenation with 100% oxygen Induction Type: IV induction and Cricoid Pressure applied Ventilation: Mask ventilation without difficulty Laryngoscope Size: McGraph and 3 Grade View: Grade I Tube type: Oral Tube size: 7.0 mm Number of attempts: 2 (first attempt DL by CRNA no view;  Mc[place]ath by Marcello Moores MD) Placement Confirmation: positive ETCO2 and breath sounds checked- equal and bilateral Secured at: 23 cm Tube secured with: Tape (slight nick to upper lip) Dental Injury: Injury to lip  Difficulty Due To: Difficulty was anticipated Future Recommendations: Recommend- induction with short-acting agent, and alternative techniques readily available

## 2018-04-04 NOTE — Progress Notes (Signed)
   04/04/18 2000  Clinical Encounter Type  Visited With Patient and family together  Visit Type Follow-up  Spiritual Encounters  Spiritual Needs Prayer;Grief support  Ch followed up on pt's request on advance directive education. Pt and wife Steward Drone and daughter, son-in-law were at bedside. Ch provided information on advance directive. Pt shared about his brother's death which is very recent. Pt and family members were in good spirit. Ch offered a prayer. Patient will reach out when they are ready to complete the advance directives.

## 2018-04-04 NOTE — Anesthesia Preprocedure Evaluation (Signed)
Anesthesia Evaluation  Patient identified by MRN, date of birth, ID band Patient awake    Reviewed: Allergy & Precautions, NPO status , Patient's Chart, lab work & pertinent test results, reviewed documented beta blocker date and time   Airway Mallampati: III  TM Distance: >3 FB     Dental  (+) Chipped   Pulmonary shortness of breath, asthma , sleep apnea and Continuous Positive Airway Pressure Ventilation , pneumonia, resolved, former smoker,           Cardiovascular hypertension, Pt. on medications and Pt. on home beta blockers + angina + CAD, + Past MI, + Cardiac Stents and + Peripheral Vascular Disease  + Valvular Problems/Murmurs      Neuro/Psych    GI/Hepatic GERD  ,  Endo/Other  diabetes, Type 2  Renal/GU Renal disease     Musculoskeletal  (+) Arthritis ,   Abdominal   Peds  Hematology   Anesthesia Other Findings EF 60-65 3 y ago. Neck movement ok.  Reproductive/Obstetrics                             Anesthesia Physical Anesthesia Plan  ASA: III  Anesthesia Plan: General   Post-op Pain Management:    Induction: Intravenous  PONV Risk Score and Plan:   Airway Management Planned: Oral ETT  Additional Equipment:   Intra-op Plan:   Post-operative Plan:   Informed Consent: I have reviewed the patients History and Physical, chart, labs and discussed the procedure including the risks, benefits and alternatives for the proposed anesthesia with the patient or authorized representative who has indicated his/her understanding and acceptance.       Plan Discussed with: CRNA  Anesthesia Plan Comments:         Anesthesia Quick Evaluation

## 2018-04-05 DIAGNOSIS — I2510 Atherosclerotic heart disease of native coronary artery without angina pectoris: Secondary | ICD-10-CM | POA: Diagnosis not present

## 2018-04-05 DIAGNOSIS — I10 Essential (primary) hypertension: Secondary | ICD-10-CM | POA: Diagnosis not present

## 2018-04-05 DIAGNOSIS — T18128A Food in esophagus causing other injury, initial encounter: Secondary | ICD-10-CM | POA: Diagnosis not present

## 2018-04-05 DIAGNOSIS — E119 Type 2 diabetes mellitus without complications: Secondary | ICD-10-CM | POA: Diagnosis not present

## 2018-04-05 LAB — GLUCOSE, CAPILLARY: Glucose-Capillary: 117 mg/dL — ABNORMAL HIGH (ref 70–99)

## 2018-04-05 MED ORDER — OMEPRAZOLE 20 MG PO CPDR: 20.0000 mg | DELAYED_RELEASE_CAPSULE | Freq: Two times a day (BID) | ORAL | 0 refills | Status: DC

## 2018-04-05 NOTE — Discharge Instructions (Addendum)
Food impaction:  Please chew food completely (meats, bread), eat slowly, swallow completely before taking next bite.  I prescribed omeprazole (prilosec) for acid reducer.  If you already have protonix at home then you can use that instead.

## 2018-04-05 NOTE — Progress Notes (Signed)
Discharge order received. Patient is alert and oriented. Vital signs stable . No signs of acute distress. Discharge instructions given. Patient verbalized understanding. No other issues noted at this time.   

## 2018-04-05 NOTE — Progress Notes (Signed)
   04/05/18 0900  Clinical Encounter Type  Visited With Patient and family together  Visit Type Follow-up  Referral From Nurse  Consult/Referral To Chaplain  Chaplain received page for AD and visit family and patient. Patient wanted to know if there was somewhere else they could go to complete the AD. Chaplain informed patient and family that they could go to their bank. The family and patient was very appreciative for the information.

## 2018-04-05 NOTE — Discharge Summary (Signed)
[place] [facility] [place] NAME: Roy [name]    MR#:  [id number]  DATE OF BIRTH:  Mar 29, 1932  DATE OF ADMISSION:  04/03/2018 ADMITTING PHYSICIAN: Vaughan Basta, MD  DATE OF DISCHARGE: 04/05/2018 10:19 AM  PRIMARY CARE PHYSICIAN: Marin Olp, MD    ADMISSION DIAGNOSIS:  Food impaction of esophagus, initial encounter [Q94.765Y]  DISCHARGE DIAGNOSIS:  Active Problems:   Food impaction of esophagus   SECONDARY DIAGNOSIS:   Past Medical History:  Diagnosis Date  . Anginal pain ([place])   . Arthritis    spine  . Asthma   . CAD (coronary artery disease)    s/p PCI of the D1 with cath in 2013 showing nonobstructive disease.  CP presumed due to small vessel disease.  [name] CAP (community acquired pneumonia) 12/26/2011  . Chronic kidney disease    renal insufficiency  . Diabetes mellitus, type 2 ([place])   . Fever 12/26/2011  . GERD (gastroesophageal reflux disease)   . Heart murmur   . Hyperlipidemia   . Hypertension   . Lyme disease    states had shakes that were thought to be parkinson's related but related to tick bite, states also RMSF  . Myocardial infarction ([place])   . Shortness of breath   . Sleep apnea    uses cpap    HOSPITAL COURSE:   1.  Food impaction with chicken in the esophagus.  Patient status post EGD yesterday.  Feeling better today. 2.  Erosive gastropathy.  GI recommends PPI treatment omeprazole 20 mg twice daily for 3 months. 3.  Nausea vomiting has improved 4.  History of CAD 5.  Type 2 diabetes mellitus.  Continue oral medications 6.  Hypertension continue oral medications  DISCHARGE CONDITIONS:   Satisfactory  CONSULTS OBTAINED:  Treatment Team:  Lin Landsman, MD  DRUG ALLERGIES:   Allergies  Allergen Reactions  . Codeine Sulfate Other (See Comments)    Chest pain  . Crestor [Rosuvastatin Calcium]   . Cephalexin Rash    DISCHARGE MEDICATIONS:   Allergies as of 04/05/2018       Reactions   Codeine Sulfate Other (See Comments)   Chest pain   Crestor [rosuvastatin Calcium]    Cephalexin Rash      Medication List    STOP taking these medications   famotidine 20 MG tablet Commonly known as:  PEPCID   metoprolol succinate 50 MG 24 hr tablet Commonly known as:  TOPROL-XL     TAKE these medications   acetaminophen 325 MG tablet Commonly known as:  TYLENOL Take 650 mg by mouth every evening. Reported on 06/28/2015   albuterol 108 (90 Base) MCG/ACT inhaler Commonly known as:  PROVENTIL HFA;VENTOLIN HFA Inhale 2 puffs into the lungs every 4 (four) hours as needed for wheezing or shortness of breath.   amLODipine 2.5 MG tablet Commonly known as:  NORVASC Take 1 tablet (2.5 mg total) by mouth daily.   aspirin EC 81 MG tablet Take 1 tablet (81 mg total) by mouth daily.   ASSURE COMFORT LANCETS 30G Misc 1 each by Other route daily.   atorvastatin 10 MG tablet Commonly known as:  LIPITOR Take 1 tablet (10 mg total) by mouth daily.   budesonide-formoterol 160-4.5 MCG/ACT inhaler Commonly known as:  SYMBICORT Inhale 2 puffs into the lungs 2 (two) times daily.   calcium-vitamin D 500-200 MG-UNIT tablet Commonly known as:  OSCAL WITH D Take 1 tablet by mouth daily.   FISH  OIL PO Take 2 capsules by mouth 2 (two) times daily.   glimepiride 4 MG tablet Commonly known as:  AMARYL Take 1 tablet (4 mg total) by mouth daily with breakfast.   glucose blood test strip Commonly known as:  ACCU-CHEK AVIVA PLUS Test blood glucose daily.   glucose blood test strip One Touch Ultra Test Strips   isosorbide mononitrate 30 MG 24 hr tablet Commonly known as:  IMDUR TAKE 3 TABLETS BY MOUTH ONCE DAILY   lisinopril 5 MG tablet Commonly known as:  PRINIVIL,ZESTRIL TAKE 1/2 (ONE-HALF) TABLET BY MOUTH ONCE DAILY   metFORMIN 500 MG tablet Commonly known as:  GLUCOPHAGE TAKE 1/2 (ONE-HALF) TABLET BY MOUTH ONCE DAILY WITH BREAKFAST   nitroGLYCERIN 0.4 MG SL  tablet Commonly known as:  NITROSTAT Place 1 tablet (0.4 mg total) under the tongue every 5 (five) minutes as needed for chest pain.   omeprazole 20 MG capsule Commonly known as:  PRILOSEC Take 1 capsule (20 mg total) by mouth 2 (two) times daily.   OVER THE COUNTER MEDICATION Take 1 tablet by mouth at bedtime. Legatrim PM   potassium citrate 10 MEQ (1080 MG) SR tablet Commonly known as:  UROCIT-K Take 10 mEq by mouth Twice daily.   ranolazine 500 MG 12 hr tablet Commonly known as:  RANEXA Take 1 tablet (500 mg total) by mouth 2 (two) times daily.   SYSTANE OP Place 1 drop into both eyes 4 (four) times daily. Itchy/dry eye   traMADol 50 MG tablet Commonly known as:  ULTRAM Take 1 tablet (50 mg total) by mouth every 6 (six) hours as needed for moderate pain (twice per day maximum).   traZODone 150 MG tablet Commonly known as:  DESYREL Take 150 mg by mouth at bedtime.   zinc sulfate 220 (50 Zn) MG capsule Take 220 mg by mouth daily.        DISCHARGE INSTRUCTIONS:   Follow-up PMD 5 days Follow-up gastroenterology if needed 1 month  If you experience worsening of your admission symptoms, develop shortness of breath, life threatening emergency, suicidal or homicidal thoughts you must seek medical attention immediately by calling 911 or calling your MD immediately  if symptoms less severe.  You Must read complete instructions/literature along with all the possible adverse reactions/side effects for all the Medicines you take and that have been prescribed to you. Take any new Medicines after you have completely understood and accept all the possible adverse reactions/side effects.   Please note  You were cared for by a hospitalist during your hospital stay. If you have any questions about your discharge medications or the care you received while you were in the hospital after you are discharged, you can call the unit and asked to speak with the hospitalist on call if the  hospitalist that took care of you is not available. Once you are discharged, your primary care physician will handle any further medical issues. Please note that NO REFILLS for any discharge medications will be authorized once you are discharged, as it is imperative that you return to your primary care physician (or establish a relationship with a primary care physician if you do not have one) for your aftercare needs so that they can reassess your need for medications and monitor your lab values.    Today   CHIEF COMPLAINT:   Chief Complaint  Patient presents with  . Foreign Body    in throat     HISTORY OF PRESENT ILLNESS:  Roy [name]  is a 85 y.o. male presented with food impaction   VITAL SIGNS:  Blood pressure (!) 106/55, pulse 69, temperature 98 F (36.7 C), temperature source Oral, resp. rate 20, height 5\' 9"  (1.753 m), weight 79.8 kg, SpO2 96 %.    PHYSICAL EXAMINATION:  GENERAL:  85 y.o.-year-old patient lying in the bed with no acute distress.  EYES: Pupils equal, round, reactive to light and accommodation. No scleral icterus. Extraocular muscles intact.  HEENT: Head atraumatic, normocephalic. Oropharynx and nasopharynx clear.  NECK:  Supple, no jugular venous distention. No thyroid enlargement, no tenderness.  LUNGS: Normal breath sounds bilaterally, no wheezing, rales,rhonchi or crepitation. No use of accessory muscles of respiration.  CARDIOVASCULAR: S1, S2 normal. No murmurs, rubs, or gallops.  ABDOMEN: Soft, non-tender, non-distended. Bowel sounds present. No organomegaly or mass.  EXTREMITIES: No pedal edema, cyanosis, or clubbing.  NEUROLOGIC: Cranial nerves II through XII are intact. Muscle strength 5/5 in all extremities. Sensation intact. Gait not checked.  PSYCHIATRIC: The patient is alert and oriented x 3.  SKIN: No obvious rash, lesion, or ulcer.   DATA REVIEW:   CBC Recent Labs  Lab 04/03/18 2329  WBC 10.4  HGB 12.9*  HCT 39.3  PLT 136*     Chemistries  Recent Labs  Lab 04/03/18 2329  NA 140  K 3.9  CL 107  CO2 28  GLUCOSE 98  BUN 29*  CREATININE 1.97*  CALCIUM 9.4  AST 22  ALT 22  ALKPHOS 60  BILITOT 1.0    RADIOLOGY:  Dg Neck Soft Tissue  Result Date: 04/03/2018 CLINICAL DATA:  Globus sensation of the throat. EXAM: NECK SOFT TISSUES - 1+ VIEW COMPARISON:  None. FINDINGS: There is no evidence of retropharyngeal soft tissue swelling or epiglottic enlargement. The cervical airway is unremarkable and no radio-opaque foreign body identified. No soft tissue emphysema is identified about the soft tissues of the neck. There is cervical spondylosis with slight reversal cervical lordosis at C4-5. Osteophytes are noted anteriorly which may contribute to the globus sensation from C3 through C7 greatest at C5-6. IMPRESSION: 1. Unremarkable appearance of the soft tissues of the neck. 2. Cervical spondylosis with slight reversal of cervical lordosis at C4-5. 3. Prominent anterior osteophytes from C3 through C7, greatest across the C5-6 interspace and which may contribute to the globus sensation. Electronically Signed   By: Ashley Royalty M.D.   On: 04/03/2018 22:46      Management plans discussed with the patient, family and they are in agreement.  CODE STATUS:  Code Status History    Date Active Date Inactive Code Status Order ID Comments User Context   04/04/2018 0045 04/05/2018 1325 Partial Code [id number]  Vaughan Basta, MD ED    Questions for Most Recent Historical Code Status (Order [id number])    Question Answer Comment   In the event of cardiac or respiratory ARREST: Initiate Code Blue, Call Rapid Response Yes    In the event of cardiac or respiratory ARREST: Perform CPR Yes    In the event of cardiac or respiratory ARREST: Perform Intubation/Mechanical Ventilation No    In the event of cardiac or respiratory ARREST: Use NIPPV/BiPAp only if indicated Yes    In the event of cardiac or respiratory ARREST:  Administer ACLS medications if indicated Yes    In the event of cardiac or respiratory ARREST: Perform Defibrillation or Cardioversion if indicated Yes       TOTAL TIME TAKING CARE OF THIS PATIENT: 35 minutes.    Terron Merfeld  Traniya Prichett M.D on 04/05/2018 at 2:47 PM  Between 7am to 6pm - Pager - [phone]  After 6pm go to [email or web address] - password [email or web address]  [phone]  CC: Primary care physician; Marin Olp, MD

## 2018-04-07 ENCOUNTER — Ambulatory Visit (INDEPENDENT_AMBULATORY_CARE_PROVIDER_SITE_OTHER): Payer: PPO | Admitting: Family Medicine

## 2018-04-07 ENCOUNTER — Encounter: Payer: Self-pay | Admitting: Gastroenterology

## 2018-04-07 VITALS — BP 138/60 | HR 65 | Temp 97.2°F | Ht 69.0 in | Wt 185.6 lb

## 2018-04-07 DIAGNOSIS — K3189 Other diseases of stomach and duodenum: Secondary | ICD-10-CM

## 2018-04-07 DIAGNOSIS — E7800 Pure hypercholesterolemia, unspecified: Secondary | ICD-10-CM

## 2018-04-07 DIAGNOSIS — E1122 Type 2 diabetes mellitus with diabetic chronic kidney disease: Secondary | ICD-10-CM | POA: Diagnosis not present

## 2018-04-07 DIAGNOSIS — N183 Chronic kidney disease, stage 3 unspecified: Secondary | ICD-10-CM

## 2018-04-07 DIAGNOSIS — T18128A Food in esophagus causing other injury, initial encounter: Secondary | ICD-10-CM

## 2018-04-07 DIAGNOSIS — I25119 Atherosclerotic heart disease of native coronary artery with unspecified angina pectoris: Secondary | ICD-10-CM | POA: Diagnosis not present

## 2018-04-07 DIAGNOSIS — I10 Essential (primary) hypertension: Secondary | ICD-10-CM

## 2018-04-07 DIAGNOSIS — E1159 Type 2 diabetes mellitus with other circulatory complications: Secondary | ICD-10-CM

## 2018-04-07 DIAGNOSIS — W44F3XA Food entering into or through a natural orifice, initial encounter: Secondary | ICD-10-CM

## 2018-04-07 DIAGNOSIS — D696 Thrombocytopenia, unspecified: Secondary | ICD-10-CM | POA: Diagnosis not present

## 2018-04-07 DIAGNOSIS — I152 Hypertension secondary to endocrine disorders: Secondary | ICD-10-CM

## 2018-04-07 DIAGNOSIS — M79644 Pain in right finger(s): Secondary | ICD-10-CM | POA: Diagnosis not present

## 2018-04-07 LAB — SURGICAL PATHOLOGY

## 2018-04-07 MED ORDER — PREDNISONE 20 MG PO TABS: ORAL_TABLET | ORAL | 0 refills | Status: DC

## 2018-04-07 NOTE — Patient Instructions (Addendum)
I suspect you rright thumb pain is related to gout or pseudogout- either way prednisone should treat this.   May run sugars up some but this will just be short term  Treat for 7 days- if no improvement in pain by end of the week please let me know.   Glad we have a visit to check in and make sure thi sis better in 10 days  Thrilled your throat is better- please take the prilosec twice a day   Thrilled the wound is healing so well

## 2018-04-07 NOTE — Progress Notes (Signed)
Phone [phone]   Subjective:  Roy [name] is a 85 y.o. year old very pleasant male patient who presents for transitional care management and hospital follow up for food impaction of esophagus and erosive gastropathy. Patient was hospitalized from 04/03/2018 to 04/05/2018. A TCM phone call was not completed- patient discharged over the weekend and then seen in follow up on the Monday after hospitalization. Medical complexity moderate   Patient presented to the hospital due to food impaction.  Patient had swallowed some chicken 7 hours prior to deciding to go to the hospital- this food got stuck in his esophagus.  He tried for hours to get this out and simply could not get it out.  Anytime he would try to drink water he would simply spew it back out.  Patient had an EGD completed the day of admission- food impaction was removed at that time.  During EGD-erosive gastropathy was noted.  Patient was started on omeprazole 20 mg twice daily with a plan to take this for 3 months.  Patient got home and since he was feeling better decided did not take this medication.  I reinforced today the importance of taking the omeprazole for 3 months.  I also sent in an additional 60 days as only 30 days were sent in from the hospitalist.  We also took the time to review his ED visit from December 28-his right arm is healing well after donkey bite.  He reports the donkey bit him over a jacket.  No skin to skin contact was made so patient was not vaccinated for rabies.  When he pulled his arm away it tore the skin and subcutaneous tissues down to the bone.  Patient has been in wound care since that time.  Wound is now much improved and is now a superficial ulceration around the size of of a quarter.  Chronic medical conditions discussed below as well as acute concern   See problem oriented charting as well ROS-no chest pain or shortness of breath.  Patient states his swallowing has been normal.  He does complain of  right hand pain.  Past Medical History-  Patient Active Problem List   Diagnosis Date Noted  . Type II diabetes mellitus with renal manifestations ([place]) 09/11/2006    Priority: High  . Coronary artery disease with angina pectoris ([place]) 09/11/2006    Priority: High  . Thrombocytopenia ([place]) 02/23/2015    Priority: Medium  . Mild persistent asthma 10/30/2010    Priority: Medium  . CKD (chronic kidney disease), stage III ([place]) 02/22/2009    Priority: Medium  . Hyperlipidemia 09/11/2006    Priority: Medium  . Hypertension associated with diabetes ([place]) 09/11/2006    Priority: Medium  . Back pain 09/11/2006    Priority: Medium  . Sleep apnea 09/11/2006    Priority: Medium  . GERD (gastroesophageal reflux disease) 05/03/2014    Priority: Low  . Insomnia 05/03/2014    Priority: Low  . Allergic rhinitis 04/21/2014    Priority: Low  . Giardia 04/21/2014    Priority: Low  . NEPHROLITHIASIS, RECURRENT 01/21/2009    Priority: Low  . ACTINIC KERATOSIS, HEAD 06/14/2008    Priority: Low  . Pain of right thumb 04/08/2018  . Food impaction of esophagus 04/03/2018  . Senile purpura ([place]) 08/08/2017    Medications- reviewed and updated  A medical reconciliation was performed comparing current medicines to hospital discharge medications. Current Outpatient Medications  Medication Sig Dispense Refill  . acetaminophen (TYLENOL) 325 MG  tablet Take 650 mg by mouth every evening. Reported on 06/28/2015    . albuterol (PROVENTIL HFA;VENTOLIN HFA) 108 (90 Base) MCG/ACT inhaler Inhale 2 puffs into the lungs every 4 (four) hours as needed for wheezing or shortness of breath. 1 Inhaler 10  . amLODipine (NORVASC) 2.5 MG tablet Take 1 tablet (2.5 mg total) by mouth daily. 90 tablet 3  . aspirin EC 81 MG tablet Take 1 tablet (81 mg total) by mouth daily.    . ASSURE COMFORT LANCETS 30G MISC 1 each by Other route daily.    [name] atorvastatin (LIPITOR) 10 MG tablet Take 1 tablet (10 mg total) by mouth  daily. 90 tablet 3  . budesonide-formoterol (SYMBICORT) 160-4.5 MCG/ACT inhaler Inhale 2 puffs into the lungs 2 (two) times daily. 10.2 g 10  . calcium-vitamin D (OSCAL WITH D) 500-200 MG-UNIT per tablet Take 1 tablet by mouth daily.    [name] glimepiride (AMARYL) 4 MG tablet Take 1 tablet (4 mg total) by mouth daily with breakfast. 90 tablet 3  . glucose blood (ACCU-CHEK AVIVA PLUS) test strip Test blood glucose daily. 100 each 12  . glucose blood test strip One Touch Ultra Test Strips 100 each 12  . isosorbide mononitrate (IMDUR) 30 MG 24 hr tablet TAKE 3 TABLETS BY MOUTH ONCE DAILY 270 tablet 3  . lisinopril (PRINIVIL,ZESTRIL) 5 MG tablet TAKE 1/2 (ONE-HALF) TABLET BY MOUTH ONCE DAILY 45 tablet 1  . metFORMIN (GLUCOPHAGE) 500 MG tablet TAKE 1/2 (ONE-HALF) TABLET BY MOUTH ONCE DAILY WITH BREAKFAST 45 tablet 1  . nitroGLYCERIN (NITROSTAT) 0.4 MG SL tablet Place 1 tablet (0.4 mg total) under the tongue every 5 (five) minutes as needed for chest pain. 25 tablet 1  . Omega-3 Fatty Acids (FISH OIL PO) Take 2 capsules by mouth 2 (two) times daily.    [name] omeprazole (PRILOSEC) 20 MG capsule Take 1 capsule (20 mg total) by mouth 2 (two) times daily. 60 capsule 0  . OVER THE COUNTER MEDICATION Take 1 tablet by mouth at bedtime. Legatrim PM    . Polyethyl Glycol-Propyl Glycol (SYSTANE OP) Place 1 drop into both eyes 4 (four) times daily. Itchy/dry eye    . potassium citrate (UROCIT-K) 10 MEQ (1080 MG) SR tablet Take 10 mEq by mouth Twice daily.    . ranolazine (RANEXA) 500 MG 12 hr tablet Take 1 tablet (500 mg total) by mouth 2 (two) times daily. 180 tablet 3  . traMADol (ULTRAM) 50 MG tablet Take 1 tablet (50 mg total) by mouth every 6 (six) hours as needed for moderate pain (twice per day maximum). 60 tablet 2  . traZODone (DESYREL) 150 MG tablet Take 150 mg by mouth at bedtime.     [name] zinc sulfate 220 MG capsule Take 220 mg by mouth daily.    . predniSONE (DELTASONE) 20 MG tablet Take 2 pills for 3 days, 1  pill for 4 days 10 tablet 0   No current facility-administered medications for this visit.    Objective  Objective:  BP 138/60 (BP Location: Left Arm, Patient Position: Sitting, Cuff Size: Normal)   Pulse 65   Temp (!) 97.2 F (36.2 C) (Oral)   Ht 5\' 9"  (1.753 m)   Wt 185 lb 9.6 oz (84.2 kg)   SpO2 97%   BMI 27.41 kg/m  Gen: NAD, resting comfortably CV: RRR no murmurs rubs or gallops Lungs: CTAB no crackles, wheeze, rhonchi Abdomen: soft/nontender/nondistended/normal bowel sounds.  Ext: no edema Skin: warm, dry, undressed wound on  right arm-about a quarter size superficial wound noted-appears to be healing well MSK: Patient with erythema, warmth, swelling around right CMC joint of thumb.  Patient tender to touch in this area.  Range of motion of the joint limited by pain.  Minimal pain also noted at the MCP joint of the same hand.    Assessment and Plan:   Food impaction/erosive gastropathy- food impaction now cleared status post endoscopy and removal.  Erosive gastropathy was noted- patient was not taking the omeprazole as planned-I reinforced the importance of this to help heal the erosive gastropathy.  We spent time in counseling over this issue-patient agrees to take the omeprazole-I sent in enough for him to have for 3 months as instructed by gastroenterology.  During hospitalization chronic medical conditions were stable, now discharged - Diabetes with CKD 3-very well controlled on Amaryl 4 mg and metformin 250 mg Lab Results  Component Value Date   HGBA1C 5.7 (A) 12/09/2017  -CKD stage III-upon discharge filtration rate was stable in low 30s with creatinine around 2 - Hypertension-remains controlled on Imdur 90 mg, metoprolol 50 mg extended release, lisinopril 5 mg, amlodipine 2.5 mg thrombocytopenia stable during hospitalization with platelets at 136 -Hyperlipidemia-maintained on atorvastatin 10 mg -CAD asymptomatic on Imdur and Ranexa per cardiology  Pain of right  thumb S: Patient reports 3-4 days of pain in right CMC joint  and some into the MCP joint of thumb.  Pain 2-3- gets up to 10 with movement.  Also has noted area is swolllen and red.  Very tender to touch. A/P: I am concerned this is either a gout or pseudogout attack-with CKD stage III we will treat with prednisone.  We did discuss with his diabetes this may raise his blood sugars- he Will monitor.  I did have some concern given recent donkey bite and wound being treated at the wound care center-this wound appears to be healing well and I doubt distant infection related to this.  We did discuss follow-up if he has new or worsening symptoms-particularly worsening pain or fever. - We will need to check a uric acid level at a future visit   Future Appointments  Date Time Provider [place]  04/17/2018 11:00 AM Marin Olp, MD [facility]  04/18/2018  9:00 AM Woodroe Chen III, PA-C [facility] None  08/06/2018  9:20 AM Radford Pax, Eber Hong, MD [facility]  We already have planned follow-up in a week-this will be a good chance to check in  Lab/Order associations: Food impaction of esophagus, initial encounter  Erosive gastropathy  Type 2 diabetes mellitus with stage 3 chronic kidney disease, without long-term current use of insulin ([place])  Pure hypercholesterolemia  Coronary artery disease involving native coronary artery of native heart with angina pectoris ([place])  Hypertension associated with diabetes ([place])  CKD (chronic kidney disease), stage III ([place])  Thrombocytopenia ([place])  Pain of right thumb  Meds ordered this encounter  Medications  . predniSONE (DELTASONE) 20 MG tablet    Sig: Take 2 pills for 3 days, 1 pill for 4 days    Dispense:  10 tablet    Refill:  0  . omeprazole (PRILOSEC) 20 MG capsule    Sig: Take 1 capsule (20 mg total) by mouth 2 (two) times daily.    Dispense:  60 capsule    Refill:  1   Return precautions advised.  [name],  MD

## 2018-04-08 DIAGNOSIS — M79644 Pain in right finger(s): Secondary | ICD-10-CM | POA: Insufficient documentation

## 2018-04-08 MED ORDER — OMEPRAZOLE 20 MG PO CPDR: 20.0000 mg | DELAYED_RELEASE_CAPSULE | Freq: Two times a day (BID) | ORAL | 1 refills | Status: DC

## 2018-04-08 NOTE — Assessment & Plan Note (Signed)
S: Patient reports 3-4 days of pain in right [facility] joint  and some into the MCP joint of thumb.  Pain 2-3- gets up to 10 with movement.  Also has noted area is swolllen and red.  Very tender to touch. A/P: I am concerned this is either a gout or pseudogout attack-with CKD stage III we will treat with prednisone.  We did discuss with his diabetes this may raise his blood sugars- he Will monitor.  I did have some concern given recent donkey bite and wound being treated at the wound care center-this wound appears to be healing well and I doubt distant infection related to this.  We did discuss follow-up if he has new or worsening symptoms-particularly worsening pain or fever. - We will need to check a uric acid level at a future visit

## 2018-04-16 ENCOUNTER — Ambulatory Visit: Payer: PPO | Admitting: Family Medicine

## 2018-04-17 ENCOUNTER — Encounter: Payer: Self-pay | Admitting: Family Medicine

## 2018-04-17 ENCOUNTER — Ambulatory Visit (INDEPENDENT_AMBULATORY_CARE_PROVIDER_SITE_OTHER): Payer: PPO | Admitting: Family Medicine

## 2018-04-17 VITALS — BP 128/60 | HR 71 | Temp 97.7°F | Ht 69.0 in | Wt 183.6 lb

## 2018-04-17 DIAGNOSIS — E1169 Type 2 diabetes mellitus with other specified complication: Secondary | ICD-10-CM

## 2018-04-17 DIAGNOSIS — M10041 Idiopathic gout, right hand: Secondary | ICD-10-CM

## 2018-04-17 DIAGNOSIS — N183 Chronic kidney disease, stage 3 unspecified: Secondary | ICD-10-CM

## 2018-04-17 DIAGNOSIS — Z6827 Body mass index (BMI) 27.0-27.9, adult: Secondary | ICD-10-CM

## 2018-04-17 DIAGNOSIS — E1122 Type 2 diabetes mellitus with diabetic chronic kidney disease: Secondary | ICD-10-CM

## 2018-04-17 DIAGNOSIS — I10 Essential (primary) hypertension: Secondary | ICD-10-CM

## 2018-04-17 DIAGNOSIS — D696 Thrombocytopenia, unspecified: Secondary | ICD-10-CM

## 2018-04-17 DIAGNOSIS — I25119 Atherosclerotic heart disease of native coronary artery with unspecified angina pectoris: Secondary | ICD-10-CM | POA: Diagnosis not present

## 2018-04-17 DIAGNOSIS — E785 Hyperlipidemia, unspecified: Secondary | ICD-10-CM

## 2018-04-17 DIAGNOSIS — E1159 Type 2 diabetes mellitus with other circulatory complications: Secondary | ICD-10-CM | POA: Diagnosis not present

## 2018-04-17 LAB — COMPREHENSIVE METABOLIC PANEL
ALT: 17 U/L (ref 0–53)
AST: 12 U/L (ref 0–37)
Albumin: 4.1 g/dL (ref 3.5–5.2)
Alkaline Phosphatase: 77 U/L (ref 39–117)
BUN: 30 mg/dL — ABNORMAL HIGH (ref 6–23)
CO2: 27 mEq/L (ref 19–32)
Calcium: 9.2 mg/dL (ref 8.4–10.5)
Chloride: 102 mEq/L (ref 96–112)
Creatinine, Ser: 1.99 mg/dL — ABNORMAL HIGH (ref 0.40–1.50)
GFR: 32.22 mL/min — ABNORMAL LOW (ref >60.00)
Glucose, Bld: 209 mg/dL — ABNORMAL HIGH (ref 70–99)
Potassium: 4.8 mEq/L (ref 3.5–5.1)
Sodium: 138 mEq/L (ref 135–145)
TOTAL PROTEIN: 6.1 g/dL (ref 6.0–8.3)
Total Bilirubin: 0.6 mg/dL (ref 0.2–1.2)

## 2018-04-17 LAB — CBC
HCT: 36.4 % — ABNORMAL LOW (ref 39.0–52.0)
Hemoglobin: 12 g/dL — ABNORMAL LOW (ref 13.0–17.0)
MCHC: 33.1 g/dL (ref 30.0–36.0)
MCV: 87.9 fl (ref 78.0–100.0)
Platelets: 191 10*3/uL (ref 150.0–400.0)
RBC: 4.14 Mil/uL — AB (ref 4.22–5.81)
RDW: 14.4 % (ref 11.5–15.5)
WBC: 11.4 10*3/uL — ABNORMAL HIGH (ref 4.0–10.5)

## 2018-04-17 LAB — URIC ACID: Uric Acid, Serum: 4.3 mg/dL (ref 4.0–7.8)

## 2018-04-17 LAB — LDL CHOLESTEROL, DIRECT: Direct LDL: 42 mg/dL

## 2018-04-17 LAB — HEMOGLOBIN A1C: Hgb A1c MFr Bld: 6.8 % — ABNORMAL HIGH (ref 4.6–6.5)

## 2018-04-17 NOTE — Patient Instructions (Addendum)
Please stop by lab before you go If you do not have mychart- we will call you about results within 5 business days of [place] receiving them.  If you have mychart- we will send your results within 3 business days of [place] receiving them.  If abnormal or we want to clarify a result, we will call or mychart you to make sure you receive the message.  If you have questions or concerns or don't hear within 5-7 days, please send [place] a message or call us.   6 months for a physical may move up to 4 months if a1c is really high

## 2018-04-17 NOTE — Progress Notes (Signed)
Phone [phone]   Subjective:  Roy [name] is a 85 y.o. year old very pleasant male patient who presents for/with See problem oriented charting ROS- mild right low back pain. No chest pain. stable shortness of breath. No headache or blurry vision.  Did have some high sugars on prednisone    BMI monitoring- elevated BMI noted: Body mass index is 27.11 kg/m. Encouraged need for healthy eating, regular exercise, weight loss.     BMI Metric Follow Up - 04/17/18 1118      BMI Metric Follow Up-Please document annually   BMI Metric Follow Up  Education provided       Past Medical History-  Patient Active Problem List   Diagnosis Date Noted  . Type II diabetes mellitus with renal manifestations ([place]) 09/11/2006    Priority: High  . Coronary artery disease with angina pectoris ([place]) 09/11/2006    Priority: High  . Thrombocytopenia ([place]) 02/23/2015    Priority: Medium  . Mild persistent asthma 10/30/2010    Priority: Medium  . CKD (chronic kidney disease), stage III ([place]) 02/22/2009    Priority: Medium  . Hyperlipidemia 09/11/2006    Priority: Medium  . Hypertension associated with diabetes ([place]) 09/11/2006    Priority: Medium  . Back pain 09/11/2006    Priority: Medium  . Sleep apnea 09/11/2006    Priority: Medium  . GERD (gastroesophageal reflux disease) 05/03/2014    Priority: Low  . Insomnia 05/03/2014    Priority: Low  . Allergic rhinitis 04/21/2014    Priority: Low  . Giardia 04/21/2014    Priority: Low  . NEPHROLITHIASIS, RECURRENT 01/21/2009    Priority: Low  . ACTINIC KERATOSIS, HEAD 06/14/2008    Priority: Low  . Pain of right thumb 04/08/2018  . Food impaction of esophagus 04/03/2018  . Senile purpura ([place]) 08/08/2017    Medications- reviewed and updated Current Outpatient Medications  Medication Sig Dispense Refill  . acetaminophen (TYLENOL) 325 MG tablet Take 650 mg by mouth every evening. Reported on 06/28/2015    . albuterol (PROVENTIL HFA;VENTOLIN  HFA) 108 (90 Base) MCG/ACT inhaler Inhale 2 puffs into the lungs every 4 (four) hours as needed for wheezing or shortness of breath. 1 Inhaler 10  . amLODipine (NORVASC) 2.5 MG tablet Take 1 tablet (2.5 mg total) by mouth daily. 90 tablet 3  . aspirin EC 81 MG tablet Take 1 tablet (81 mg total) by mouth daily.    . ASSURE COMFORT LANCETS 30G MISC 1 each by Other route daily.    [name] atorvastatin (LIPITOR) 10 MG tablet Take 1 tablet (10 mg total) by mouth daily. 90 tablet 3  . budesonide-formoterol (SYMBICORT) 160-4.5 MCG/ACT inhaler Inhale 2 puffs into the lungs 2 (two) times daily. 10.2 g 10  . calcium-vitamin D (OSCAL WITH D) 500-200 MG-UNIT per tablet Take 1 tablet by mouth daily.    [name] glimepiride (AMARYL) 4 MG tablet Take 1 tablet (4 mg total) by mouth daily with breakfast. 90 tablet 3  . glucose blood (ACCU-CHEK AVIVA PLUS) test strip Test blood glucose daily. 100 each 12  . glucose blood test strip One Touch Ultra Test Strips 100 each 12  . isosorbide mononitrate (IMDUR) 30 MG 24 hr tablet TAKE 3 TABLETS BY MOUTH ONCE DAILY 270 tablet 3  . lisinopril (PRINIVIL,ZESTRIL) 5 MG tablet TAKE 1/2 (ONE-HALF) TABLET BY MOUTH ONCE DAILY 45 tablet 1  . metFORMIN (GLUCOPHAGE) 500 MG tablet TAKE 1/2 (ONE-HALF) TABLET BY MOUTH ONCE DAILY WITH BREAKFAST 45 tablet  1  . nitroGLYCERIN (NITROSTAT) 0.4 MG SL tablet Place 1 tablet (0.4 mg total) under the tongue every 5 (five) minutes as needed for chest pain. 25 tablet 1  . Omega-3 Fatty Acids (FISH OIL PO) Take 2 capsules by mouth 2 (two) times daily.    [name] omeprazole (PRILOSEC) 20 MG capsule Take 1 capsule (20 mg total) by mouth 2 (two) times daily. 60 capsule 1  . OVER THE COUNTER MEDICATION Take 1 tablet by mouth at bedtime. Legatrim PM    . Polyethyl Glycol-Propyl Glycol (SYSTANE OP) Place 1 drop into both eyes 4 (four) times daily. Itchy/dry eye    . potassium citrate (UROCIT-K) 10 MEQ (1080 MG) SR tablet Take 10 mEq by mouth Twice daily.    . ranolazine  (RANEXA) 500 MG 12 hr tablet Take 1 tablet (500 mg total) by mouth 2 (two) times daily. 180 tablet 3  . traMADol (ULTRAM) 50 MG tablet Take 1 tablet (50 mg total) by mouth every 6 (six) hours as needed for moderate pain (twice per day maximum). 60 tablet 2  . traZODone (DESYREL) 150 MG tablet Take 150 mg by mouth at bedtime.     [name] zinc sulfate 220 MG capsule Take 220 mg by mouth daily.     No current facility-administered medications for this visit.      Objective:  BP 128/60 (BP Location: Left Arm, Patient Position: Sitting, Cuff Size: Normal)   Pulse 71   Temp 97.7 F (36.5 C) (Oral)   Ht 5\' 9"  (1.753 m)   Wt 183 lb 9.6 oz (83.3 kg)   SpO2 98%   BMI 27.11 kg/m  Gen: NAD, resting comfortably CV: RRR no murmurs rubs or gallops Lungs: CTAB no crackles, wheeze, rhonchi Abdomen: soft/nontender/nondistended/normal bowel sounds. No rebound or guarding.  Ext: no edema Skin: warm, dry, compression wrap over right arm Neuro: Normal speech and gait MSK: Erythema and edema and tenderness over right CMC joint of the thumb from last week have resolved    Assessment and Plan   # Diabetes with CKD III S:compliant with  Metformin 500mg  half tablet with breakfast CBG- 200 to 300 on prednisone recently but trended back down off medicine A/P: Stable. Continue current medications.     # CAD with angina/Hyperlipidemia S:compliant with atorvastatin 10mg  and aspirin 81mg . LDL goal <70 . Asymptomatic while on ranexa and imdur.  A/P: Stable problems x2. Continue current medications.     # Hypertension/CKD III S:compliant with  Amlodipine 2.5, imdur 30mg , lisinopril 5 mg -on lisinopril in case proteinuric element. GFR usually 30-40 range A/P: Stable problems x2 suspected- update bmp . Continue current medications.    Other notes: 1.hoping tomorrow is last visit to the wound center.   2. Likely gout flare in right hand has resolved- will get uric acid level with labs. Addendum- uric acid not  high- may have been psuedogout 3. First visit in sometime without thrombocytopenia- will continue to monitor.   Future Appointments  Date Time Provider [place]  04/18/2018  9:00 AM Jeri Cos [place] III, PA-C [facility] None  08/06/2018  9:20 AM Sueanne Margarita, MD [facility]  08/19/2018  9:40 AM Marin Olp, MD [facility]   Return in about 4 months (around 08/16/2018).  Lab/Order associations: Type 2 diabetes mellitus with stage 3 chronic kidney disease, without long-term current use of insulin ([facility]) - Plan: Hemoglobin A1c, CBC, Comprehensive metabolic panel, LDL cholesterol, direct, CANCELED: POCT glycosylated hemoglobin (Hb A1C)  Hypertension associated  with diabetes ([place])  Coronary artery disease involving native coronary artery of native heart with angina pectoris ([place])  CKD (chronic kidney disease), stage III ([facility])  Thrombocytopenia ([facility])  Idiopathic gout of right hand, unspecified chronicity - Plan: Uric acid  BMI 27.0-27.9,adult  Hyperlipidemia associated with type 2 diabetes mellitus ([place])  Return precautions advised.  [name], MD

## 2018-04-18 ENCOUNTER — Encounter: Payer: PPO | Attending: Physician Assistant | Admitting: Physician Assistant

## 2018-04-18 DIAGNOSIS — Z833 Family history of diabetes mellitus: Secondary | ICD-10-CM | POA: Insufficient documentation

## 2018-04-18 DIAGNOSIS — I2510 Atherosclerotic heart disease of native coronary artery without angina pectoris: Secondary | ICD-10-CM | POA: Insufficient documentation

## 2018-04-18 DIAGNOSIS — E11622 Type 2 diabetes mellitus with other skin ulcer: Secondary | ICD-10-CM | POA: Diagnosis not present

## 2018-04-18 DIAGNOSIS — I10 Essential (primary) hypertension: Secondary | ICD-10-CM | POA: Diagnosis not present

## 2018-04-18 DIAGNOSIS — L98492 Non-pressure chronic ulcer of skin of other sites with fat layer exposed: Secondary | ICD-10-CM | POA: Diagnosis not present

## 2018-04-18 DIAGNOSIS — Z809 Family history of malignant neoplasm, unspecified: Secondary | ICD-10-CM | POA: Insufficient documentation

## 2018-04-18 DIAGNOSIS — D696 Thrombocytopenia, unspecified: Secondary | ICD-10-CM | POA: Insufficient documentation

## 2018-04-18 DIAGNOSIS — Z841 Family history of disorders of kidney and ureter: Secondary | ICD-10-CM | POA: Diagnosis not present

## 2018-04-18 DIAGNOSIS — X58XXXA Exposure to other specified factors, initial encounter: Secondary | ICD-10-CM | POA: Diagnosis not present

## 2018-04-18 DIAGNOSIS — S51801A Unspecified open wound of right forearm, initial encounter: Secondary | ICD-10-CM | POA: Diagnosis not present

## 2018-04-20 ENCOUNTER — Other Ambulatory Visit: Payer: Self-pay | Admitting: Family Medicine

## 2018-04-20 NOTE — Progress Notes (Signed)
[name], [name] ([id number]) Visit Report for 04/18/2018 Chief Complaint Document Details Patient Name: Roy [name], [name] Date of Service: 04/18/2018 9:00 AM Medical Record Number: [id number] Patient Account Number: [id number] Date of Birth/Sex: 28-Dec-1932 (85 y.o. M) Treating RN: Montey Hora Primary Care Provider: [name] Other Clinician: Referring Provider: [name] Treating Provider/Extender: Melburn Hake, HOYT Weeks in Treatment: 6 Information Obtained from: Patient Chief Complaint Donkey bite/Trauma right forearm Electronic Signature(s) Signed: 04/20/2018 7:15:48 AM By: Worthy Keeler PA-C Entered By: Worthy Keeler on 04/18/2018 09:06:02 Viscuso, Roy [name] ([id number]) -------------------------------------------------------------------------------- HPI Details Patient Name: Roy [name], [name] Date of Service: 04/18/2018 9:00 AM Medical Record Number: [id number] Patient Account Number: [id number] Date of Birth/Sex: 1932-12-07 (85 y.o. M) Treating RN: Montey Hora Primary Care Provider: [name] Other Clinician: Referring Provider: [name] Treating Provider/Extender: Melburn Hake, HOYT Weeks in Treatment: 6 History of Present Illness HPI Description: 03/06/18 on evaluation today patient appears to be doing okay upon initial evaluation here in our clinic. He does have a skin tear which occurred on the right form which was a result of having been bitten by his daughter's donkey. He states that the animal is normally very mild tempered and in fact was not even mad when this occurred he had an [place] and was attempting to feed the donkey the donkey missed and biting for the [place] and got his arm through his coat. It did not pierce the coat but nonetheless just the pressure of the bite itself because the skin breakdown. Nonetheless he did have a significant skin tear which required that he go to the hospital at the emergency department on Saturday, March 01, 2018. Subsequently he was given clindamycin 300 mg four times a day. He states that his the does have diabetes and is A1c is around 5.1 at the last check. No fevers chills noted he does tell me that they had a difficult time getting his breathing under control at the emergency department when he was there. Fortunately this does not seem to be waiting separately this point although it is going to require some debridement bleeding may become an issue. Otherwise the patient has no other major medical problems and aspirin is the only blood thinner that he is on at this point. 03/13/18 on evaluation today patient's arm appears to be doing significantly better which is good news. He's been tolerating the dressing changes without complication. Fortunately there is no sign of infection. No fevers, chills, nausea, or vomiting noted at this time. 03/20/18 on evaluation today patient's arm ulcer actually appears to be doing excellent at this time. This has a great granulation surface and has done extremely well with the [facility] Dressing for the last week in my pinion. There is no sign of infection. No fevers, chills, nausea, or vomiting noted at this time. 03/27/18 on evaluation today patient actually appears to be doing very well in regard to his right forearm ulcer. This appears to show signs of great improvement and though it is healing slowly it has fielding quite nicely and hopefully will start and continue to show signs of epithelialization over the next few weeks. No fevers, chills, nausea, or vomiting noted at this time. 04/03/18 on evaluation today patient's arm actually appears to be doing excellent at this point. He has been tolerating the dressing changes without complication. Fortunately there is no signs of infection at this time. Overall very pleased with how things are progressing. We did have to show him which side of  his Hydrofera Blue Dressing goes down to the wound. He is now aware of  how to do this it was backwards when he came in today. 04/18/18 on evaluation today patient presents for follow-up concerning his right forearm ulcer. He's been tolerating the dressing changes without your for a blue with no complication. Fortunately there is no signs of infection at this time which is also good news. Overall I'm very pleased with the progress he's made since I last saw him. Electronic Signature(s) Signed: 04/20/2018 7:15:48 AM By: Worthy Keeler PA-C Entered By: Worthy Keeler on 04/18/2018 09:44:53 [name], [name] -------------------------------------------------------------------------------- Physical Exam Details Patient Name: Roy [name], [name] Date of Service: 04/18/2018 9:00 AM Medical Record Number: [id number] Patient Account Number: [id number] Date of Birth/Sex: 1932-12-19 (85 y.o. M) Treating RN: Montey Hora Primary Care Provider: [name] Other Clinician: Referring Provider: [name] Treating Provider/Extender: STONE III, HOYT Weeks in Treatment: 6 Constitutional Well-nourished and well-hydrated in no acute distress. Respiratory normal breathing without difficulty. clear to auscultation bilaterally. Cardiovascular regular rate and rhythm with normal S1, S2. Psychiatric this patient is able to make decisions and demonstrates good insight into disease process. Alert and Oriented x 3. pleasant and cooperative. Notes Patient's wound bed currently shows signs of good granulation there's no significant hyper granulation which is also good news. He has epithelialization noted creeping over the surface of the wound at multiple locations which is also good news. Overall I'm very pleased with how things seem to be progressing at this point. Electronic Signature(s) Signed: 04/20/2018 7:15:48 AM By: Worthy Keeler PA-C Entered By: Worthy Keeler on 04/18/2018 09:45:17 [name], [name] -------------------------------------------------------------------------------- Physician Orders Details Patient Name: Roy [name], [name] Date of Service: 04/18/2018 9:00 AM Medical Record Number: [id number] Patient Account Number: [id number] Date of Birth/Sex: [birth year] (85 y.o. M) Treating RN: Montey Hora Primary Care Provider: [name] Other Clinician: Referring Provider: [name] Treating Provider/Extender: Melburn Hake, HOYT Weeks in Treatment: 6 Verbal / Phone Orders: No Diagnosis Coding ICD-10 Coding Code Description W55.81XA Bitten by other mammals, initial encounter S51.801A Unspecified open wound of right forearm, initial encounter E11.622 Type 2 diabetes mellitus with other skin ulcer Wound Cleansing Wound #1 Right Forearm o Cleanse wound with mild soap and water - Clean wound with Dial antibacterial soap (pump), gently pat to dry. o May Shower, gently pat wound dry prior to applying new dressing. Primary Wound Dressing Wound #1 Right Forearm o Hydrafera Blue Ready Transfer Secondary Dressing Wound #1 Right Forearm o Dry Gauze o Conform/Kerlix o Coban Dressing Change Frequency Wound #1 Right Forearm o Change dressing every other day. Follow-up Appointments Wound #1 Right Forearm o Return Appointment in 2 weeks. Electronic Signature(s) Signed: 04/18/2018 4:52:28 PM By: Montey Hora Signed: 04/20/2018 7:15:48 AM By: Worthy Keeler PA-C Entered By: Montey Hora on 04/18/2018 09:32:25 Kesling, Roy [name] ([id number]) -------------------------------------------------------------------------------- Problem List Details Patient Name: Roy [name], [name] Date of Service: 04/18/2018 9:00 AM Medical Record Number: [id number] Patient Account Number: [id number] Date of Birth/Sex: [birth year] (85 y.o. M) Treating RN: Montey Hora Primary Care Provider: [name] Other Clinician: Referring Provider: [name] Treating  Provider/Extender: Melburn Hake, HOYT Weeks in Treatment: 6 Active Problems ICD-10 Evaluated Encounter Code Description Active Date Today Diagnosis W55.81XA Bitten by other mammals, initial encounter 03/06/2018 No Yes S51.801A Unspecified open wound of right forearm, initial encounter 03/06/2018 No Yes E11.622 Type 2 diabetes mellitus with other skin ulcer 03/06/2018 No Yes Inactive Problems Resolved Problems Electronic Signature(s) Signed:  04/20/2018 7:15:48 AM By: Worthy Keeler PA-C Entered By: Worthy Keeler on 04/18/2018 09:05:56 Hassell, Roy [name] ([id number]) -------------------------------------------------------------------------------- Progress Note Details Patient Name: Roy [name], [name] Date of Service: 04/18/2018 9:00 AM Medical Record Number: [id number] Patient Account Number: [id number] Date of Birth/Sex: [birth year] (85 y.o. M) Treating RN: Montey Hora Primary Care Provider: [name] Other Clinician: Referring Provider: [name] Treating Provider/Extender: Melburn Hake, HOYT Weeks in Treatment: 6 Subjective Chief Complaint Information obtained from Patient Pollie Friar bite/Trauma right forearm History of Present Illness (HPI) 03/06/18 on evaluation today patient appears to be doing okay upon initial evaluation here in our clinic. He does have a skin tear which occurred on the right form which was a result of having been bitten by his daughter's donkey. He states that the animal is normally very mild tempered and in fact was not even mad when this occurred he had an [place] and was attempting to feed the donkey the donkey missed and biting for the [place] and got his arm through his coat. It did not pierce the coat but nonetheless just the pressure of the bite itself because the skin breakdown. Nonetheless he did have a significant skin tear which required that he go to the hospital at the emergency department on Saturday, March 01, 2018. Subsequently he was given clindamycin  300 mg four times a day. He states that his the does have diabetes and is A1c is around 5.1 at the last check. No fevers chills noted he does tell me that they had a difficult time getting his breathing under control at the emergency department when he was there. Fortunately this does not seem to be waiting separately this point although it is going to require some debridement bleeding may become an issue. Otherwise the patient has no other major medical problems and aspirin is the only blood thinner that he is on at this point. 03/13/18 on evaluation today patient's arm appears to be doing significantly better which is good news. He's been tolerating the dressing changes without complication. Fortunately there is no sign of infection. No fevers, chills, nausea, or vomiting noted at this time. 03/20/18 on evaluation today patient's arm ulcer actually appears to be doing excellent at this time. This has a great granulation surface and has done extremely well with the [facility] Dressing for the last week in my pinion. There is no sign of infection. No fevers, chills, nausea, or vomiting noted at this time. 03/27/18 on evaluation today patient actually appears to be doing very well in regard to his right forearm ulcer. This appears to show signs of great improvement and though it is healing slowly it has fielding quite nicely and hopefully will start and continue to show signs of epithelialization over the next few weeks. No fevers, chills, nausea, or vomiting noted at this time. 04/03/18 on evaluation today patient's arm actually appears to be doing excellent at this point. He has been tolerating the dressing changes without complication. Fortunately there is no signs of infection at this time. Overall very pleased with how things are progressing. We did have to show him which side of his [facility] Dressing goes down to the wound. He is now aware of how to do this it was backwards when he came in  today. 04/18/18 on evaluation today patient presents for follow-up concerning his right forearm ulcer. He's been tolerating the dressing changes without your for a blue with no complication. Fortunately there is no signs of infection at this time  which is also good news. Overall I'm very pleased with the progress he's made since I last saw him. Patient History Information obtained from Patient. Family History Cancer - Siblings, Diabetes - Siblings, Heart Disease - Siblings, Kidney Disease - Siblings, No family history of Hereditary Spherocytosis, Hypertension, Lung Disease, Seizures, Stroke, Thyroid Problems, Tuberculosis. Roy [name], Roy [name] ([id number]) Social History Former smoker - ended on 03/05/1958, Marital Status - Married, Alcohol Use - Never, Drug Use - No History, Caffeine Use - Daily. Medical History Respiratory Patient has history of Sleep Apnea Cardiovascular Patient has history of Coronary Artery Disease, Hypertension Endocrine Patient has history of Type II Diabetes Medical And Surgical History Notes Hematologic/Lymphatic Thrombocytopenia Review of Systems (ROS) Constitutional Symptoms (General Health) Denies complaints or symptoms of Fever, Chills. Respiratory The patient has no complaints or symptoms. Cardiovascular The patient has no complaints or symptoms. Psychiatric The patient has no complaints or symptoms. Objective Constitutional Well-nourished and well-hydrated in no acute distress. Vitals Time Taken: 8:57 AM, Height: 69 in, Weight: 181 lbs, BMI: 26.7, Temperature: 97.7 F, Pulse: 64 bpm, Respiratory Rate: 16 breaths/min, Blood Pressure: 132/62 mmHg. Respiratory normal breathing without difficulty. clear to auscultation bilaterally. Cardiovascular regular rate and rhythm with normal S1, S2. Psychiatric this patient is able to make decisions and demonstrates good insight into disease process. Alert and Oriented x 3. pleasant and cooperative. General  Notes: Patient's wound bed currently shows signs of good granulation there's no significant hyper granulation which is also good news. He has epithelialization noted creeping over the surface of the wound at multiple locations which is also good news. Overall I'm very pleased with how things seem to be progressing at this point. Roy [name], [name] ([id number]) Integumentary (Hair, Skin) Wound #1 status is Open. Original cause of wound was Trauma. The wound is located on the Right Forearm. The wound measures 2cm length x 1.5cm width x 0.1cm depth; 2.356cm^2 area and 0.236cm^3 volume. There is Fat Layer (Subcutaneous Tissue) Exposed exposed. There is no tunneling or undermining noted. There is a large amount of serosanguineous drainage noted. The wound margin is flat and intact. There is large (67-100%) red, pink granulation within the wound bed. There is no necrotic tissue within the wound bed. The periwound skin appearance exhibited: Excoriation, Hemosiderin Staining. The periwound skin appearance did not exhibit: Callus, Crepitus, Induration, Rash, Scarring, Dry/Scaly, Maceration, Atrophie Blanche, Cyanosis, Ecchymosis, Mottled, Pallor, Rubor, Erythema. Periwound temperature was noted as No Abnormality. Assessment Active Problems ICD-10 Bitten by other mammals, initial encounter Unspecified open wound of right forearm, initial encounter Type 2 diabetes mellitus with other skin ulcer Plan Wound Cleansing: Wound #1 Right Forearm: Cleanse wound with mild soap and water - Clean wound with Dial antibacterial soap (pump), gently pat to dry. May Shower, gently pat wound dry prior to applying new dressing. Primary Wound Dressing: Wound #1 Right Forearm: Hydrafera Blue Ready Transfer Secondary Dressing: Wound #1 Right Forearm: Dry Gauze Conform/Kerlix Coban Dressing Change Frequency: Wound #1 Right Forearm: Change dressing every other day. Follow-up Appointments: Wound #1 Right  Forearm: Return Appointment in 2 weeks. I did not have to perform any sharp debridement today which is excellent. We will subsequently see were things stand at follow-up. If anything changes or worsens in the meantime the patient will contact the office and let me know. Otherwise my hope is that he will continue to show signs of healing week by week will see him back to see were things stand. Please see above for specific wound care orders. We  will see patient for re-evaluation in 2 week(s) here in the clinic. If anything worsens or changes patient will contact our office for additional recommendations. Roy [name], Roy [name] ([id number]) Electronic Signature(s) Signed: 04/20/2018 7:15:48 AM By: Worthy Keeler PA-C Entered By: Worthy Keeler on 04/18/2018 09:45:42 Roy [name], Roy [name] ([id number]) -------------------------------------------------------------------------------- ROS/PFSH Details Patient Name: Roy [name], [name] Date of Service: 04/18/2018 9:00 AM Medical Record Number: [id number] Patient Account Number: [id number] Date of Birth/Sex: 11/17/1932 (85 y.o. M) Treating RN: Montey Hora Primary Care Provider: [name] Other Clinician: Referring Provider: [name] Treating Provider/Extender: Melburn Hake, HOYT Weeks in Treatment: 6 Information Obtained From Patient Wound History Do you currently have one or more open woundso Yes How many open wounds do you currently haveo 1 Approximately how long have you had your woundso March 01, 2018 How have you been treating your wound(s) until nowo coban, soap and water Has your wound(s) ever healed and then re-openedo No Have you had any lab work done in the past montho No Have you tested positive for an antibiotic resistant organism (MRSA, VRE)o No Have you tested positive for osteomyelitis (bone infection)o No Have you had any tests for circulation on your legso No Have you had other problems associated with your woundso  Swelling Constitutional Symptoms (General Health) Complaints and Symptoms: Negative for: Fever; Chills Hematologic/Lymphatic Medical History: Past Medical History Notes: Thrombocytopenia Respiratory Complaints and Symptoms: No Complaints or Symptoms Medical History: Positive for: Sleep Apnea Cardiovascular Complaints and Symptoms: No Complaints or Symptoms Medical History: Positive for: Coronary Artery Disease; Hypertension Endocrine Medical History: Positive for: Type II Diabetes Time with diabetes: 8 years Roy [name], [name] ([id number]) Psychiatric Complaints and Symptoms: No Complaints or Symptoms Immunizations Pneumococcal Vaccine: Received Pneumococcal Vaccination: Yes Implantable Devices Family and Social History Cancer: Yes - Siblings; Diabetes: Yes - Siblings; Heart Disease: Yes - Siblings; Hereditary Spherocytosis: No; Hypertension: No; Kidney Disease: Yes - Siblings; Lung Disease: No; Seizures: No; Stroke: No; Thyroid Problems: No; Tuberculosis: No; Former smoker - ended on 03/05/1958; Marital Status - Married; Alcohol Use: Never; Drug Use: No History; Caffeine Use: Daily; Financial Concerns: No; Food, Clothing or Shelter Needs: No; Support System Lacking: No; Transportation Concerns: No; Advanced Directives: No; Patient does not want information on Advanced Directives; Living Will: No; Medical Power of Attorney: No Physician Affirmation I have reviewed and agree with the above information. Electronic Signature(s) Signed: 04/18/2018 4:52:28 PM By: Montey Hora Signed: 04/20/2018 7:15:48 AM By: Worthy Keeler PA-C Entered By: Worthy Keeler on 04/18/2018 09:45:07 Roy [name], Roy [name] R. ([id number]) -------------------------------------------------------------------------------- SuperBill Details Patient Name: Ratterree, Jahsir R. Date of Service: 04/18/2018 Medical Record Number: [id number] Patient Account Number: [id number] Date of Birth/Sex: [birth year] (85 y.o. M) Treating  RN: Montey Hora Primary Care Provider: [name] Other Clinician: Referring Provider: [name] Treating Provider/Extender: Melburn Hake, HOYT Weeks in Treatment: 6 Diagnosis Coding ICD-10 Codes Code Description [phone] Bitten by other mammals, initial encounter S51.801A Unspecified open wound of right forearm, initial encounter E11.622 Type 2 diabetes mellitus with other skin ulcer Facility Procedures CPT4 Code: [id number] Description: 99213 - WOUND CARE VISIT-LEV 3 EST PT Modifier: Quantity: 1 Physician Procedures CPT4 Code: 2229798 Description: 92119 - WC PHYS LEVEL 4 - EST PT ICD-10 Diagnosis Description W55.81XA Bitten by other mammals, initial encounter S51.801A Unspecified open wound of right forearm, initial encounte E11.622 Type 2 diabetes mellitus with other skin ulcer Modifier: r Quantity: 1 Electronic Signature(s) Signed: 04/20/2018 7:15:48 AM By: Worthy Keeler PA-C Entered By: Worthy Keeler on  04/18/2018 09:45:51 

## 2018-04-21 NOTE — Progress Notes (Signed)
MARKALE, BIRDSELL ([id number]) Visit Report for 04/18/2018 Arrival Information Details Patient Name: Rivet, PANKAJ HAACK. Date of Service: 04/18/2018 9:00 AM Medical Record Number: [id number] Patient Account Number: [id number] Date of Birth/Sex: 04-Feb-1933 (85 y.o. M) Treating RN: Army Melia Primary Care Evart Mcdonnell: [name] Other Clinician: Referring Linas Stepter: [name] Treating Tierre Netto/Extender: Melburn Hake, HOYT Weeks in Treatment: 6 Visit Information History Since Last Visit Added or deleted any medications: No Patient Arrived: Ambulatory Any new allergies or adverse reactions: No Arrival Time: 08:57 Had a fall or experienced change in No Accompanied By: wife activities of daily living that may affect Transfer Assistance: None risk of falls: Patient Identification Verified: Yes Signs or symptoms of abuse/neglect since last visito No Hospitalized since last visit: No Implantable device outside of the clinic excluding No cellular tissue based products placed in the center since last visit: Pain Present Now: No Electronic Signature(s) Signed: 04/21/2018 8:46:05 AM By: Army Melia Entered By: Army Melia on 04/18/2018 08:58:21 Oglesby, Trypp R. ([id number]) -------------------------------------------------------------------------------- Clinic Level of Care Assessment Details Patient Name: Hice, Nitesh R. Date of Service: 04/18/2018 9:00 AM Medical Record Number: [id number] Patient Account Number: [id number] Date of Birth/Sex: Oct 18, 1932 (85 y.o. M) Treating RN: Montey Hora Primary Care Levita Monical: [name] Other Clinician: Referring Oralee Rapaport: [name] Treating Bodin Gorka/Extender: Melburn Hake, HOYT Weeks in Treatment: 6 Clinic Level of Care Assessment Items TOOL 4 Quantity Score []  - Use when only an EandM is performed on FOLLOW-UP visit 0 ASSESSMENTS - Nursing Assessment / Reassessment X - Reassessment of Co-morbidities (includes updates in patient status) 1  10 X- 1 5 Reassessment of Adherence to Treatment Plan ASSESSMENTS - Wound and Skin Assessment / Reassessment X - Simple Wound Assessment / Reassessment - one wound 1 5 []  - 0 Complex Wound Assessment / Reassessment - multiple wounds []  - 0 Dermatologic / Skin Assessment (not related to wound area) ASSESSMENTS - Focused Assessment []  - Circumferential Edema Measurements - multi extremities 0 []  - 0 Nutritional Assessment / Counseling / Intervention []  - 0 Lower Extremity Assessment (monofilament, tuning fork, pulses) []  - 0 Peripheral Arterial Disease Assessment (using hand held doppler) ASSESSMENTS - Ostomy and/or Continence Assessment and Care []  - Incontinence Assessment and Management 0 []  - 0 Ostomy Care Assessment and Management (repouching, etc.) PROCESS - Coordination of Care X - Simple Patient / Family Education for ongoing care 1 15 []  - 0 Complex (extensive) Patient / Family Education for ongoing care X- 1 10 Staff obtains Programmer, systems, Records, Test Results / Process Orders []  - 0 Staff telephones HHA, Nursing Homes / Clarify orders / etc []  - 0 Routine Transfer to another Facility (non-emergent condition) []  - 0 Routine Hospital Admission (non-emergent condition) []  - 0 New Admissions / Biomedical engineer / Ordering NPWT, Apligraf, etc. []  - 0 Emergency Hospital Admission (emergent condition) X- 1 10 Simple Discharge Coordination [place], Dayshaun R. ([id number]) []  - 0 Complex (extensive) Discharge Coordination PROCESS - Special Needs []  - Pediatric / Minor Patient Management 0 []  - 0 Isolation Patient Management []  - 0 Hearing / Language / Visual special needs []  - 0 Assessment of Community assistance (transportation, D/C planning, etc.) []  - 0 Additional assistance / Altered mentation []  - 0 Support Surface(s) Assessment (bed, cushion, seat, etc.) INTERVENTIONS - Wound Cleansing / Measurement X - Simple Wound Cleansing - one wound 1 5 []  - 0 Complex  Wound Cleansing - multiple wounds X- 1 5 Wound Imaging (photographs - any number of wounds) []  - 0 Wound Tracing (instead  of photographs) X- 1 5 Simple Wound Measurement - one wound []  - 0 Complex Wound Measurement - multiple wounds INTERVENTIONS - Wound Dressings X - Small Wound Dressing one or multiple wounds 1 10 []  - 0 Medium Wound Dressing one or multiple wounds []  - 0 Large Wound Dressing one or multiple wounds []  - 0 Application of Medications - topical []  - 0 Application of Medications - injection INTERVENTIONS - Miscellaneous []  - External ear exam 0 []  - 0 Specimen Collection (cultures, biopsies, blood, body fluids, etc.) []  - 0 Specimen(s) / Culture(s) sent or taken to Lab for analysis []  - 0 Patient Transfer (multiple staff / Civil Service fast streamer / Similar devices) []  - 0 Simple Staple / Suture removal (25 or less) []  - 0 Complex Staple / Suture removal (26 or more) []  - 0 Hypo / Hyperglycemic Management (close monitor of Blood Glucose) []  - 0 Ankle / Brachial Index (ABI) - do not check if billed separately X- 1 5 Vital Signs Spickard, Cebert R. ([id number]) Has the patient been seen at the hospital within the last three years: Yes Total Score: 85 Level Of Care: New/Established - Level 3 Electronic Signature(s) Signed: 04/18/2018 4:52:28 PM By: Montey Hora Entered By: Montey Hora on 04/18/2018 09:32:51 Hickmon, Pincus Sanes ([id number]) -------------------------------------------------------------------------------- Encounter Discharge Information Details Patient Name: Joerger, Lamarr R. Date of Service: 04/18/2018 9:00 AM Medical Record Number: [id number] Patient Account Number: [id number] Date of Birth/Sex: 08-11-1932 (85 y.o. M) Treating RN: Montey Hora Primary Care Everlee Quakenbush: [name] Other Clinician: Referring Carrisa Keller: [name] Treating Fouad Taul/Extender: Melburn Hake, HOYT Weeks in Treatment: 6 Encounter Discharge Information Items Discharge  Condition: Stable Ambulatory Status: Ambulatory Discharge Destination: Home Transportation: Private Auto Accompanied By: spouse Schedule Follow-up Appointment: Yes Clinical Summary of Care: Electronic Signature(s) Signed: 04/18/2018 4:52:28 PM By: Montey Hora Entered By: Montey Hora on 04/18/2018 09:33:21 Zaino, Pincus Sanes ([id number]) -------------------------------------------------------------------------------- Lower Extremity Assessment Details Patient Name: Madonia, Aryav R. Date of Service: 04/18/2018 9:00 AM Medical Record Number: [id number] Patient Account Number: [id number] Date of Birth/Sex: [birth year] (85 y.o. M) Treating RN: Army Melia Primary Care Gaylen Venning: [name] Other Clinician: Referring Eunie Lawn: [name] Treating Alonte Wulff/Extender: Melburn Hake, HOYT Weeks in Treatment: 6 Electronic Signature(s) Signed: 04/21/2018 8:46:05 AM By: Army Melia Entered By: Army Melia on 04/18/2018 09:02:25 Hoel, Maveric R. ([id number]) -------------------------------------------------------------------------------- Multi Wound Chart Details Patient Name: Ellers, Traye R. Date of Service: 04/18/2018 9:00 AM Medical Record Number: [id number] Patient Account Number: [id number] Date of Birth/Sex: 1932/03/06 (85 y.o. M) Treating RN: Montey Hora Primary Care Estefanny Moler: [name] Other Clinician: Referring Amay Mijangos: [name] Treating Mane Consolo/Extender: Melburn Hake, HOYT Weeks in Treatment: 6 Vital Signs Height(in): 69 Pulse(bpm): 76 Weight(lbs): 181 Blood Pressure(mmHg): 132/62 Body Mass Index(BMI): 27 Temperature(F): 97.7 Respiratory Rate 16 (breaths/min): Photos: [1:No Photos] [N/A:N/A] Wound Location: [1:Right Forearm] [N/A:N/A] Wounding Event: [1:Trauma] [N/A:N/A] Primary Etiology: [1:Trauma, Other] [N/A:N/A] Comorbid History: [1:Sleep Apnea, Coronary Artery N/A Disease, Hypertension, Type II Diabetes] Date Acquired: [1:03/01/2018]  [N/A:N/A] Weeks of Treatment: [1:6] [N/A:N/A] Wound Status: [1:Open] [N/A:N/A] Measurements L x W x D [1:2x1.5x0.1] [N/A:N/A] (cm) Area (cm) : [1:2.356] [N/A:N/A] Volume (cm) : [1:0.236] [N/A:N/A] % Reduction in Area: [1:82.90%] [N/A:N/A] % Reduction in Volume: [1:82.80%] [N/A:N/A] Classification: [1:Full Thickness Without Exposed Support Structures] [N/A:N/A] Exudate Amount: [1:Large] [N/A:N/A] Exudate Type: [1:Serosanguineous] [N/A:N/A] Exudate Color: [1:red, brown] [N/A:N/A] Wound Margin: [1:Flat and Intact] [N/A:N/A] Granulation Amount: [1:Large (67-100%)] [N/A:N/A] Granulation Quality: [1:Red, Pink] [N/A:N/A] Necrotic Amount: [1:None Present (0%)] [N/A:N/A] Exposed Structures: [1:Fat Layer (Subcutaneous Tissue) Exposed: Yes Fascia:  No Tendon: No Muscle: No Joint: No Bone: No] [N/A:N/A] Epithelialization: [1:Small (1-33%)] [N/A:N/A] Periwound Skin Texture: [1:Excoriation: Yes Induration: No Callus: No Crepitus: No] [N/A:N/A] Rash: No Scarring: No Periwound Skin Moisture: Maceration: No N/A N/A Dry/Scaly: No Periwound Skin Color: Hemosiderin Staining: Yes N/A N/A Atrophie Blanche: No Cyanosis: No Ecchymosis: No Erythema: No Mottled: No Pallor: No Rubor: No Temperature: No Abnormality N/A N/A Tenderness on Palpation: No N/A N/A Wound Preparation: Ulcer Cleansing: N/A N/A Rinsed/Irrigated with Saline Topical Anesthetic Applied: Other: lidocaine 4% Treatment Notes Electronic Signature(s) Signed: 04/18/2018 4:52:28 PM By: Montey Hora Entered By: Montey Hora on 04/18/2018 09:31:36 Paterson, Pincus Sanes ([id number]) -------------------------------------------------------------------------------- Multi-Disciplinary Care Plan Details Patient Name: Kreitzer, Oron R. Date of Service: 04/18/2018 9:00 AM Medical Record Number: [id number] Patient Account Number: [id number] Date of Birth/Sex: 05/01/1932 (85 y.o. M) Treating RN: Montey Hora Primary Care Felina Tello: [name] Other Clinician: Referring Brandell Maready: [name] Treating Maydell Knoebel/Extender: Melburn Hake, HOYT Weeks in Treatment: 6 Active Inactive Abuse / Safety / Falls / Self Care Management Nursing Diagnoses: Potential for falls Goals: Patient will not experience any injury related to falls Date Initiated: 03/06/2018 Target Resolution Date: 06/07/2018 Goal Status: Active Interventions: Assess fall risk on admission and as needed Notes: Orientation to the Wound Care Program Nursing Diagnoses: Knowledge deficit related to the wound healing center program Goals: Patient/caregiver will verbalize understanding of the [place] Program Date Initiated: 03/06/2018 Target Resolution Date: 06/07/2018 Goal Status: Active Interventions: Provide education on orientation to the wound center Notes: Wound/Skin Impairment Nursing Diagnoses: Impaired tissue integrity Goals: Ulcer/skin breakdown will have a volume reduction of 30% by week 4 Date Initiated: 03/06/2018 Target Resolution Date: 04/06/2018 Goal Status: Active Interventions: Assess patient/caregiver ability to obtain necessary supplies Billy, Vince R. ([id number]) Assess patient/caregiver ability to perform ulcer/skin care regimen upon admission and as needed Assess ulceration(s) every visit Notes: Electronic Signature(s) Signed: 04/18/2018 4:52:28 PM By: Montey Hora Entered By: Montey Hora on 04/18/2018 09:31:29 Dyar, Kymari R. ([id number]) -------------------------------------------------------------------------------- Pain Assessment Details Patient Name: Faubert, Cola R. Date of Service: 04/18/2018 9:00 AM Medical Record Number: [id number] Patient Account Number: [id number] Date of Birth/Sex: 1932-03-27 (85 y.o. M) Treating RN: Army Melia Primary Care Jearline Hirschhorn: [name] Other Clinician: Referring Lunetta Marina: [name] Treating Kishia Shackett/Extender: Melburn Hake, HOYT Weeks in Treatment: 6 Active  Problems Location of Pain Severity and Description of Pain Patient Has Paino No Site Locations Pain Management and Medication Current Pain Management: Electronic Signature(s) Signed: 04/21/2018 8:46:05 AM By: Army Melia Entered By: Army Melia on 04/18/2018 08:58:28 Reames, Pincus Sanes ([id number]) -------------------------------------------------------------------------------- Patient/Caregiver Education Details Patient Name: Petrovic, Hektor R. Date of Service: 04/18/2018 9:00 AM Medical Record Number: [id number] Patient Account Number: [id number] Date of Birth/Gender: 01/03/1933 (85 y.o. M) Treating RN: Montey Hora Primary Care Physician: [name] Other Clinician: Referring Physician: [name] Treating Physician/Extender: Sharalyn Ink in Treatment: 6 Education Assessment Education Provided To: Patient and Caregiver Education Topics Provided Wound/Skin Impairment: Handouts: Other: wound care as ordered Methods: Demonstration, Explain/Verbal Responses: State content correctly Electronic Signature(s) Signed: 04/18/2018 4:52:28 PM By: Montey Hora Entered By: Montey Hora on 04/18/2018 09:33:42 Grunert, [place]. ([id number]) -------------------------------------------------------------------------------- Wound Assessment Details Patient Name: Bernhard, Ebrima R. Date of Service: 04/18/2018 9:00 AM Medical Record Number: [id number] Patient Account Number: [id number] Date of Birth/Sex: 05/02/1932 (85 y.o. M) Treating RN: Army Melia Primary Care Millard Bautch: [name] Other Clinician: Referring Elesha Thedford: [name] Treating Len Kluver/Extender: Melburn Hake, HOYT Weeks in Treatment: 6 Wound Status Wound Number: 1 Primary Trauma, Other  Etiology: Wound Location: Right Forearm Wound Open Wounding Event: Trauma Status: Date Acquired: 03/01/2018 Comorbid Sleep Apnea, Coronary Artery Disease, Weeks Of Treatment: 6 History: Hypertension, Type II  Diabetes Clustered Wound: No Photos Photo Uploaded By: Army Melia on 04/18/2018 11:58:22 Wound Measurements Length: (cm) 2 Width: (cm) 1.5 Depth: (cm) 0.1 Area: (cm) 2.356 Volume: (cm) 0.236 % Reduction in Area: 82.9% % Reduction in Volume: 82.8% Epithelialization: Small (1-33%) Tunneling: No Undermining: No Wound Description Full Thickness Without Exposed Support Classification: Structures Wound Margin: Flat and Intact Exudate Large Amount: Exudate Type: Serosanguineous Exudate Color: red, brown Foul Odor After Cleansing: No Slough/Fibrino Yes Wound Bed Granulation Amount: Large (67-100%) Exposed Structure Granulation Quality: Red, Pink Fascia Exposed: No Necrotic Amount: None Present (0%) Fat Layer (Subcutaneous Tissue) Exposed: Yes Tendon Exposed: No Muscle Exposed: No Joint Exposed: No Bone Exposed: No Passmore, Rowen R. ([id number]) Periwound Skin Texture Texture Color No Abnormalities Noted: No No Abnormalities Noted: No Callus: No Atrophie Blanche: No Crepitus: No Cyanosis: No Excoriation: Yes Ecchymosis: No Induration: No Erythema: No Rash: No Hemosiderin Staining: Yes Scarring: No Mottled: No Pallor: No Moisture Rubor: No No Abnormalities Noted: No Dry / Scaly: No Temperature / Pain Maceration: No Temperature: No Abnormality Wound Preparation Ulcer Cleansing: Rinsed/Irrigated with Saline Topical Anesthetic Applied: Other: lidocaine 4%, Treatment Notes Wound #1 (Right Forearm) Notes Hydrofera Blue, conform secured with coban Electronic Signature(s) Signed: 04/21/2018 8:46:05 AM By: Army Melia Entered By: Army Melia on 04/18/2018 09:02:15 Gebhardt, Majed R. ([id number]) -------------------------------------------------------------------------------- Vitals Details Patient Name: Curbow, Glenwood R. Date of Service: 04/18/2018 9:00 AM Medical Record Number: [id number] Patient Account Number: [id number] Date of Birth/Sex: [birth year] (85 y.o.  M) Treating RN: Army Melia Primary Care Chasidy Janak: [name] Other Clinician: Referring Marleen Moret: [name] Treating Lemoyne Nestor/Extender: Melburn Hake, HOYT Weeks in Treatment: 6 Vital Signs Time Taken: 08:57 Temperature (F): 97.7 Height (in): 69 Pulse (bpm): 64 Weight (lbs): 181 Respiratory Rate (breaths/min): 16 Body Mass Index (BMI): 26.7 Blood Pressure (mmHg): 132/62 Reference Range: 80 - 120 mg / dl Airway Electronic Signature(s) Signed: 04/21/2018 8:46:05 AM By: Army Melia Entered By: Army Melia on 04/18/2018 08:58:59

## 2018-05-02 ENCOUNTER — Encounter: Payer: PPO | Admitting: Physician Assistant

## 2018-05-02 DIAGNOSIS — L98492 Non-pressure chronic ulcer of skin of other sites with fat layer exposed: Secondary | ICD-10-CM | POA: Diagnosis not present

## 2018-05-02 DIAGNOSIS — E11622 Type 2 diabetes mellitus with other skin ulcer: Secondary | ICD-10-CM | POA: Diagnosis not present

## 2018-05-06 ENCOUNTER — Ambulatory Visit: Payer: Self-pay | Admitting: *Deleted

## 2018-05-06 ENCOUNTER — Ambulatory Visit (INDEPENDENT_AMBULATORY_CARE_PROVIDER_SITE_OTHER): Payer: PPO

## 2018-05-06 ENCOUNTER — Encounter: Payer: Self-pay | Admitting: Physician Assistant

## 2018-05-06 ENCOUNTER — Ambulatory Visit (INDEPENDENT_AMBULATORY_CARE_PROVIDER_SITE_OTHER): Payer: PPO | Admitting: Physician Assistant

## 2018-05-06 VITALS — BP 140/70 | HR 75 | Temp 101.0°F | Ht 69.0 in | Wt 186.0 lb

## 2018-05-06 DIAGNOSIS — N183 Chronic kidney disease, stage 3 (moderate): Secondary | ICD-10-CM

## 2018-05-06 DIAGNOSIS — R6889 Other general symptoms and signs: Secondary | ICD-10-CM | POA: Diagnosis not present

## 2018-05-06 DIAGNOSIS — E1122 Type 2 diabetes mellitus with diabetic chronic kidney disease: Secondary | ICD-10-CM

## 2018-05-06 DIAGNOSIS — R05 Cough: Secondary | ICD-10-CM | POA: Diagnosis not present

## 2018-05-06 LAB — GLUCOSE, POCT (MANUAL RESULT ENTRY): POC Glucose: 146 mg/dl — AB (ref 70–99)

## 2018-05-06 LAB — POC INFLUENZA A&B (BINAX/QUICKVUE)
Influenza A, POC: NEGATIVE
Influenza B, POC: NEGATIVE

## 2018-05-06 MED ORDER — PREDNISONE 5 MG PO TABS: ORAL_TABLET | ORAL | 0 refills | Status: DC

## 2018-05-06 MED ORDER — DOXYCYCLINE HYCLATE 100 MG PO TABS: 100.0000 mg | ORAL_TABLET | Freq: Two times a day (BID) | ORAL | 0 refills | Status: DC

## 2018-05-06 NOTE — Telephone Encounter (Signed)
Pt reports productive cough, Temp 100.2,  onset Sunday. States cough productive for yellowish phlegm. Reports mild SOB, has been using inhalers, Mucinex, "Not much help." Denies chest tightness,hemoptysis, no wheezing. States cough severe at HS, "Keeps [place] awake."  H/O asthma, DM, COPD. Pt unable to make appt until later in day due to transportation issues. Appt made for 1540 with S. Worley. Care advise given per protocol, verbalizes understanding.  Reason for Disposition . [1] Fever > 100.0 F (37.8 C) AND [2] diabetes mellitus or weak immune system (e.g., HIV positive, cancer chemo, splenectomy, organ transplant, chronic steroids)  Answer Assessment - Initial Assessment Questions 1. ONSET: "When did the cough begin?"      Sunday 2. SEVERITY: "How bad is the cough today?"      Severe 3. RESPIRATORY DISTRESS: "Describe your breathing."      "Little SOB" 4. FEVER: "Do you have a fever?" If so, ask: "What is your temperature, how was it measured, and when did it start?"     Chills, temp 100.2  5. SPUTUM: "Describe the color of your sputum" (clear, white, yellow, green)     Yellow to greenish 6. HEMOPTYSIS: "Are you coughing up any blood?" If so ask: "How much?" (flecks, streaks, tablespoons, etc.)     no 7. CARDIAC HISTORY: "Do you have any history of heart disease?" (e.g., heart attack, congestive heart failure)      CAD 8. LUNG HISTORY: "Do you have any history of lung disease?"  (e.g., pulmonary embolus, asthma, emphysema)     Asthma  COPD 9. PE RISK FACTORS: "Do you have a history of blood clots?" (or: recent major surgery, recent prolonged travel, bedridden)     no 10. OTHER SYMPTOMS: "Do you have any other symptoms?" (e.g., runny nose, wheezing, chest pain)      Mild headache  Protocols used: COUGH - ACUTE PRODUCTIVE-A-AH

## 2018-05-06 NOTE — Progress Notes (Addendum)
HUMBERT, MOROZOV ([id number]) Visit Report for 05/02/2018 Arrival Information Details Patient Name: Roy [name], Roy [name]. Date of Service: 05/02/2018 9:00 AM Medical Record Number: [id number] Patient Account Number: [id number] Date of Birth/Sex: [birth year] (85 y.o. M) Treating RN: Secundino Ginger Primary Care Rondey Fallen: [name] Other Clinician: Referring Chrissy Ealey: [name] Treating Khylin Gutridge/Extender: Melburn Hake, HOYT Weeks in Treatment: 8 Visit Information History Since Last Visit Added or deleted any medications: No Patient Arrived: Ambulatory Any new allergies or adverse reactions: No Arrival Time: 09:00 Had a fall or experienced change in No Accompanied By: wife activities of daily living that may affect Transfer Assistance: None risk of falls: Patient Identification Verified: Yes Signs or symptoms of abuse/neglect since last visito No Secondary Verification Process Completed: Yes Hospitalized since last visit: No Implantable device outside of the clinic excluding No cellular tissue based products placed in the center since last visit: Has Dressing in Place as Prescribed: Yes Pain Present Now: No Electronic Signature(s) Signed: 05/02/2018 2:18:07 PM By: Secundino Ginger Entered By: Secundino Ginger on 05/02/2018 09:01:42 Labat, Keshawn R. ([id number]) -------------------------------------------------------------------------------- Clinic Level of Care Assessment Details Patient Name: Malinowski, Marshel R. Date of Service: 05/02/2018 9:00 AM Medical Record Number: [id number] Patient Account Number: [id number] Date of Birth/Sex: April 13, 1932 (85 y.o. M) Treating RN: Cornell Barman Primary Care Pelagia Iacobucci: [name] Other Clinician: Referring Myla Mauriello: [name] Treating Trevyon Swor/Extender: Melburn Hake, HOYT Weeks in Treatment: 8 Clinic Level of Care Assessment Items TOOL 4 Quantity Score []  - Use when only an EandM is performed on FOLLOW-UP visit 0 ASSESSMENTS - Nursing Assessment /  Reassessment []  - Reassessment of Co-morbidities (includes updates in patient status) 0 X- 1 5 Reassessment of Adherence to Treatment Plan ASSESSMENTS - Wound and Skin Assessment / Reassessment X - Simple Wound Assessment / Reassessment - one wound 1 5 []  - 0 Complex Wound Assessment / Reassessment - multiple wounds []  - 0 Dermatologic / Skin Assessment (not related to wound area) ASSESSMENTS - Focused Assessment []  - Circumferential Edema Measurements - multi extremities 0 []  - 0 Nutritional Assessment / Counseling / Intervention []  - 0 Lower Extremity Assessment (monofilament, tuning fork, pulses) []  - 0 Peripheral Arterial Disease Assessment (using hand held doppler) ASSESSMENTS - Ostomy and/or Continence Assessment and Care []  - Incontinence Assessment and Management 0 []  - 0 Ostomy Care Assessment and Management (repouching, etc.) PROCESS - Coordination of Care X - Simple Patient / Family Education for ongoing care 1 15 []  - 0 Complex (extensive) Patient / Family Education for ongoing care []  - 0 Staff obtains Programmer, systems, Records, Test Results / Process Orders []  - 0 Staff telephones HHA, Nursing Homes / Clarify orders / etc []  - 0 Routine Transfer to another Facility (non-emergent condition) []  - 0 Routine Hospital Admission (non-emergent condition) []  - 0 New Admissions / Biomedical engineer / Ordering NPWT, Apligraf, etc. []  - 0 Emergency Hospital Admission (emergent condition) X- 1 10 Simple Discharge Coordination Beidleman, Keahi R. ([id number]) []  - 0 Complex (extensive) Discharge Coordination PROCESS - Special Needs []  - Pediatric / Minor Patient Management 0 []  - 0 Isolation Patient Management []  - 0 Hearing / Language / Visual special needs []  - 0 Assessment of Community assistance (transportation, D/C planning, etc.) []  - 0 Additional assistance / Altered mentation []  - 0 Support Surface(s) Assessment (bed, cushion, seat, etc.) INTERVENTIONS - Wound  Cleansing / Measurement X - Simple Wound Cleansing - one wound 1 5 []  - 0 Complex Wound Cleansing - multiple wounds X- 1 5 Wound Imaging (photographs -  any number of wounds) []  - 0 Wound Tracing (instead of photographs) X- 1 5 Simple Wound Measurement - one wound []  - 0 Complex Wound Measurement - multiple wounds INTERVENTIONS - Wound Dressings []  - Small Wound Dressing one or multiple wounds 0 X- 1 15 Medium Wound Dressing one or multiple wounds []  - 0 Large Wound Dressing one or multiple wounds []  - 0 Application of Medications - topical []  - 0 Application of Medications - injection INTERVENTIONS - Miscellaneous []  - External ear exam 0 []  - 0 Specimen Collection (cultures, biopsies, blood, body fluids, etc.) []  - 0 Specimen(s) / Culture(s) sent or taken to Lab for analysis []  - 0 Patient Transfer (multiple staff / Civil Service fast streamer / Similar devices) []  - 0 Simple Staple / Suture removal (25 or less) []  - 0 Complex Staple / Suture removal (26 or more) []  - 0 Hypo / Hyperglycemic Management (close monitor of Blood Glucose) []  - 0 Ankle / Brachial Index (ABI) - do not check if billed separately X- 1 5 Vital Signs Culbertson, Avian R. ([id number]) Has the patient been seen at the hospital within the last three years: Yes Total Score: 70 Level Of Care: New/Established - Level 2 Electronic Signature(s) Signed: 05/05/2018 4:00:49 PM By: Gretta Cool, BSN, RN, CWS, Kim RN, BSN Entered By: Gretta Cool, BSN, RN, CWS, Kim on 05/02/2018 09:18:11 Seebeck, Pincus Sanes ([id number]) -------------------------------------------------------------------------------- Encounter Discharge Information Details Patient Name: Zeigler, Daymion R. Date of Service: 05/02/2018 9:00 AM Medical Record Number: [id number] Patient Account Number: [id number] Date of Birth/Sex: 06-10-1932 (85 y.o. M) Treating RN: Cornell Barman Primary Care Illyana Schorsch: [name] Other Clinician: Referring Merrillyn Ackerley: [name] Treating  Evoleth Nordmeyer/Extender: Melburn Hake, HOYT Weeks in Treatment: 8 Encounter Discharge Information Items Discharge Condition: Stable Ambulatory Status: Ambulatory Discharge Destination: Home Transportation: Private Auto Accompanied By: wife Schedule Follow-up Appointment: Yes Clinical Summary of Care: Electronic Signature(s) Signed: 05/05/2018 4:00:49 PM By: Gretta Cool, BSN, RN, CWS, Kim RN, BSN Entered By: Gretta Cool, BSN, RN, CWS, Kim on 05/02/2018 09:19:16 Nwosu, Pincus Sanes ([id number]) -------------------------------------------------------------------------------- Lower Extremity Assessment Details Patient Name: Kafer, Rawlins R. Date of Service: 05/02/2018 9:00 AM Medical Record Number: [id number] Patient Account Number: [id number] Date of Birth/Sex: 1932/04/25 (85 y.o. M) Treating RN: Secundino Ginger Primary Care Adilenne Ashworth: [name] Other Clinician: Referring Harout Scheurich: [name] Treating Cachet Mccutchen/Extender: Melburn Hake, HOYT Weeks in Treatment: 8 Electronic Signature(s) Signed: 05/02/2018 2:18:07 PM By: Secundino Ginger Entered By: Secundino Ginger on 05/02/2018 09:06:29 Olmo, Atalissa. ([id number]) -------------------------------------------------------------------------------- Multi Wound Chart Details Patient Name: Magel, Carmen R. Date of Service: 05/02/2018 9:00 AM Medical Record Number: [id number] Patient Account Number: [id number] Date of Birth/Sex: 09/24/1932 (85 y.o. M) Treating RN: Cornell Barman Primary Care Nadia Torr: [name] Other Clinician: Referring Jessen Siegman: [name] Treating Aj Crunkleton/Extender: Melburn Hake, HOYT Weeks in Treatment: 8 Vital Signs Height(in): 69 Pulse(bpm): 65 Weight(lbs): 181 Blood Pressure(mmHg): 138/61 Body Mass Index(BMI): 27 Temperature(F): 97.9 Respiratory Rate 16 (breaths/min): Photos: [N/A:N/A] Wound Location: Right Forearm N/A N/A Wounding Event: Trauma N/A N/A Primary Etiology: Trauma, Other N/A N/A Comorbid History: Sleep Apnea, Coronary Artery N/A  N/A Disease, Hypertension, Type II Diabetes Date Acquired: 03/01/2018 N/A N/A Weeks of Treatment: 8 N/A N/A Wound Status: Open N/A N/A Measurements L x W x D 0.3x0.4x0.1 N/A N/A (cm) Area (cm) : 0.094 N/A N/A Volume (cm) : 0.009 N/A N/A % Reduction in Area: 99.30% N/A N/A % Reduction in Volume: 99.30% N/A N/A Classification: Full Thickness Without N/A N/A Exposed Support Structures Exudate Amount: Small N/A N/A Exudate Type: Serous  N/A N/A Exudate Color: amber N/A N/A Wound Margin: Flat and Intact N/A N/A Granulation Amount: None Present (0%) N/A N/A Necrotic Amount: Large (67-100%) N/A N/A Necrotic Tissue: Eschar N/A N/A Exposed Structures: Fat Layer (Subcutaneous N/A N/A Tissue) Exposed: Yes Fascia: No Tendon: No Muscle: No Aguilera, Shahzad R. ([id number]) Joint: No Bone: No Epithelialization: Small (1-33%) N/A N/A Periwound Skin Texture: Excoriation: Yes N/A N/A Scarring: Yes Induration: No Callus: No Crepitus: No Rash: No Periwound Skin Moisture: Maceration: No N/A N/A Dry/Scaly: No Periwound Skin Color: Hemosiderin Staining: Yes N/A N/A Atrophie Blanche: No Cyanosis: No Ecchymosis: No Erythema: No Mottled: No Pallor: No Rubor: No Temperature: No Abnormality N/A N/A Tenderness on Palpation: No N/A N/A Wound Preparation: Ulcer Cleansing: N/A N/A Rinsed/Irrigated with Saline Topical Anesthetic Applied: Other: lidocaine 4% Treatment Notes Electronic Signature(s) Signed: 05/05/2018 4:00:49 PM By: Gretta Cool, BSN, RN, CWS, Kim RN, BSN Entered By: Gretta Cool, BSN, RN, CWS, Kim on 05/02/2018 09:16:01 Madore, Pincus Sanes ([id number]) -------------------------------------------------------------------------------- Multi-Disciplinary Care Plan Details Patient Name: Bredeson, Arjun R. Date of Service: 05/02/2018 9:00 AM Medical Record Number: [id number] Patient Account Number: [id number] Date of Birth/Sex: [birth year] (85 y.o. M) Treating RN: Cornell Barman Primary Care Olaf Mesa:  [name] Other Clinician: Referring Chrystle Murillo: [name] Treating Franko Hilliker/Extender: Sharalyn Ink in Treatment: 8 Active Inactive Electronic Signature(s) Signed: 05/27/2018 2:00:05 PM By: Gretta Cool, BSN, RN, CWS, Kim RN, BSN Previous Signature: 05/05/2018 4:00:49 PM Version By: Gretta Cool, BSN, RN, CWS, Kim RN, BSN Entered By: Gretta Cool, BSN, RN, CWS, Kim on 05/27/2018 14:00:04 Tretter, Pincus Sanes ([id number]) -------------------------------------------------------------------------------- Pain Assessment Details Patient Name: Sabala, Arvie R. Date of Service: 05/02/2018 9:00 AM Medical Record Number: [id number] Patient Account Number: [id number] Date of Birth/Sex: January 11, 1933 (85 y.o. M) Treating RN: Secundino Ginger Primary Care Trust Crago: [name] Other Clinician: Referring Shacoria Latif: [name] Treating Howard Patton/Extender: Melburn Hake, HOYT Weeks in Treatment: 8 Active Problems Location of Pain Severity and Description of Pain Patient Has Paino No Site Locations Pain Management and Medication Current Pain Management: Electronic Signature(s) Signed: 05/02/2018 2:18:07 PM By: Secundino Ginger Entered By: Secundino Ginger on 05/02/2018 09:01:49 Avallone, Pincus Sanes ([id number]) -------------------------------------------------------------------------------- Patient/Caregiver Education Details Patient Name: Fossett, Scottie R. Date of Service: 05/02/2018 9:00 AM Medical Record Number: [id number] Patient Account Number: [id number] Date of Birth/Gender: 15-Aug-1932 (85 y.o. M) Treating RN: Cornell Barman Primary Care Physician: [name] Other Clinician: Referring Physician: [name] Treating Physician/Extender: Sharalyn Ink in Treatment: 8 Education Assessment Education Provided To: Patient Education Topics Provided Wound/Skin Impairment: Handouts: Caring for Your Ulcer, Other: continue wound care as prescribed Methods: Demonstration, Explain/Verbal Responses: State content  correctly Electronic Signature(s) Signed: 05/05/2018 4:00:49 PM By: Gretta Cool, BSN, RN, CWS, Kim RN, BSN Entered By: Gretta Cool, BSN, RN, CWS, Kim on 05/02/2018 09:18:36 Lesko, Pincus Sanes ([id number]) -------------------------------------------------------------------------------- Wound Assessment Details Patient Name: Lingard, Saliou R. Date of Service: 05/02/2018 9:00 AM Medical Record Number: [id number] Patient Account Number: [id number] Date of Birth/Sex: Sep 28, 1932 (85 y.o. M) Treating RN: Secundino Ginger Primary Care Cambree Hendrix: [name] Other Clinician: Referring Edith Groleau: [name] Treating Dondi Burandt/Extender: Melburn Hake, HOYT Weeks in Treatment: 8 Wound Status Wound Number: 1 Primary Trauma, Other Etiology: Wound Location: Right Forearm Wound Open Wounding Event: Trauma Status: Date Acquired: 03/01/2018 Comorbid Sleep Apnea, Coronary Artery Disease, Weeks Of Treatment: 8 History: Hypertension, Type II Diabetes Clustered Wound: No Photos Photo Uploaded By: Secundino Ginger on 05/02/2018 09:14:31 Wound Measurements Length: (cm) 0.3 Width: (cm) 0.4 Depth: (cm) 0.1 Area: (cm) 0.094 Volume: (cm) 0.009 % Reduction in Area: 99.3% %  Reduction in Volume: 99.3% Epithelialization: Small (1-33%) Tunneling: No Undermining: No Wound Description Full Thickness Without Exposed Support Foul Od Classification: Structures Slough/ Wound Margin: Flat and Intact Exudate Small Amount: Exudate Type: Serous Exudate Color: amber or After Cleansing: No Fibrino No Wound Bed Granulation Amount: None Present (0%) Exposed Structure Necrotic Amount: Large (67-100%) Fascia Exposed: No Necrotic Quality: Eschar Fat Layer (Subcutaneous Tissue) Exposed: Yes Tendon Exposed: No Muscle Exposed: No Joint Exposed: No Bone Exposed: No Zou, Nathaniel R. ([id number]) Periwound Skin Texture Texture Color No Abnormalities Noted: No No Abnormalities Noted: No Callus: No Atrophie Blanche: No Crepitus:  No Cyanosis: No Excoriation: Yes Ecchymosis: No Induration: No Erythema: No Rash: No Hemosiderin Staining: Yes Scarring: Yes Mottled: No Pallor: No Moisture Rubor: No No Abnormalities Noted: No Dry / Scaly: No Temperature / Pain Maceration: No Temperature: No Abnormality Wound Preparation Ulcer Cleansing: Rinsed/Irrigated with Saline Topical Anesthetic Applied: Other: lidocaine 4%, Electronic Signature(s) Signed: 05/02/2018 2:18:07 PM By: Secundino Ginger Entered By: Secundino Ginger on 05/02/2018 09:06:22 Bartus, Hanford R. ([id number]) -------------------------------------------------------------------------------- Vitals Details Patient Name: Mckeon, Jhaden R. Date of Service: 05/02/2018 9:00 AM Medical Record Number: [id number] Patient Account Number: [id number] Date of Birth/Sex: December 10, 1932 (85 y.o. M) Treating RN: Secundino Ginger Primary Care Nancylee Gaines: [name] Other Clinician: Referring Haidee Stogsdill: [name] Treating Abiel Antrim/Extender: Melburn Hake, HOYT Weeks in Treatment: 8 Vital Signs Time Taken: 09:01 Temperature (F): 97.9 Height (in): 69 Pulse (bpm): 65 Weight (lbs): 181 Respiratory Rate (breaths/min): 16 Body Mass Index (BMI): 26.7 Blood Pressure (mmHg): 138/61 Reference Range: 80 - 120 mg / dl Electronic Signature(s) Signed: 05/02/2018 2:18:07 PM By: Secundino Ginger Entered BySecundino Ginger on 05/02/2018 09:04:55

## 2018-05-06 NOTE — Progress Notes (Signed)
Sitzman, Roy [name] ([id number]) Visit Report for 05/02/2018 Chief Complaint Document Details Patient Name: Roy [name], Roy [name]. Date of Service: 05/02/2018 9:00 AM Medical Record Number: [id number] Patient Account Number: [id number] Date of Birth/Sex: 01-09-1933 (85 y.o. M) Treating RN: Cornell Barman Primary Care Provider: [name] Other Clinician: Referring Provider: [name] Treating Provider/Extender: Worthy Keeler Weeks in Treatment: 8 Information Obtained from: Patient Chief Complaint Donkey bite/Trauma right forearm Electronic Signature(s) Signed: 05/02/2018 5:19:05 PM By: Worthy Keeler PA-C Entered By: Worthy Keeler on 05/02/2018 09:10:32 Roy [name], Roy [name]. ([id number]) -------------------------------------------------------------------------------- HPI Details Patient Name: Roy [name]. Date of Service: 05/02/2018 9:00 AM Medical Record Number: [id number] Patient Account Number: [id number] Date of Birth/Sex: 22-Jan-1933 (85 y.o. M) Treating RN: Cornell Barman Primary Care Provider: [name] Other Clinician: Referring Provider: [name] Treating Provider/Extender: Melburn Hake, HOYT Weeks in Treatment: 8 History of Present Illness HPI Description: 03/06/18 on evaluation today patient appears to be doing okay upon initial evaluation here in our clinic. He does have a skin tear which occurred on the right form which was a result of having been bitten by his daughter's donkey. He states that the animal is normally very mild tempered and in fact was not even mad when this occurred he had an [place] and was attempting to feed the donkey the donkey missed and biting for the [place] and got his arm through his coat. It did not pierce the coat but nonetheless just the pressure of the bite itself because the skin breakdown. Nonetheless he did have a significant skin tear which required that he go to the hospital at the emergency department on Saturday, March 01, 2018. Subsequently he was given clindamycin 300 mg four times a day. He states that his the does have diabetes and is A1c is around 5.1 at the last check. No fevers chills noted he does tell me that they had a difficult time getting his breathing under control at the emergency department when he was there. Fortunately this does not seem to be waiting separately this point although it is going to require some debridement bleeding may become an issue. Otherwise the patient has no other major medical problems and aspirin is the only blood thinner that he is on at this point. 03/13/18 on evaluation today patient's arm appears to be doing significantly better which is good news. He's been tolerating the dressing changes without complication. Fortunately there is no sign of infection. No fevers, chills, nausea, or vomiting noted at this time. 03/20/18 on evaluation today patient's arm ulcer actually appears to be doing excellent at this time. This has a great granulation surface and has done extremely well with the [facility] Dressing for the last week in my pinion. There is no sign of infection. No fevers, chills, nausea, or vomiting noted at this time. 03/27/18 on evaluation today patient actually appears to be doing very well in regard to his right forearm ulcer. This appears to show signs of great improvement and though it is healing slowly it has fielding quite nicely and hopefully will start and continue to show signs of epithelialization over the next few weeks. No fevers, chills, nausea, or vomiting noted at this time. 04/03/18 on evaluation today patient's arm actually appears to be doing excellent at this point. He has been tolerating the dressing changes without complication. Fortunately there is no signs of infection at this time. Overall very pleased with how things are progressing. We did have to show him which side of  his Hydrofera Blue Dressing goes down to the wound. He is now aware of  how to do this it was backwards when he came in today. 04/18/18 on evaluation today patient presents for follow-up concerning his right forearm ulcer. He's been tolerating the dressing changes without your for a blue with no complication. Fortunately there is no signs of infection at this time which is also good news. Overall I'm very pleased with the progress he's made since I last saw him. 05/02/18 on evaluation today patient actually appears to be doing rather well in regard to his right forearm ulcer. Overall I'm very pleased with his progress at this point he seems to be doing excellent. There's no signs of infection and this has improved dramatically just since two weeks ago. Electronic Signature(s) Signed: 05/02/2018 5:19:05 PM By: Worthy Keeler PA-C Entered By: Worthy Keeler on 05/02/2018 09:19:30 Roy [name], Roy [name] ([id number]) -------------------------------------------------------------------------------- Physical Exam Details Patient Name: Roy [name]. Date of Service: 05/02/2018 9:00 AM Medical Record Number: [id number] Patient Account Number: [id number] Date of Birth/Sex: Feb 10, 1933 (85 y.o. M) Treating RN: Cornell Barman Primary Care Provider: [name] Other Clinician: Referring Provider: [name] Treating Provider/Extender: Melburn Hake, HOYT Weeks in Treatment: 8 Constitutional Well-nourished and well-hydrated in no acute distress. Respiratory normal breathing without difficulty. clear to auscultation bilaterally. Cardiovascular regular rate and rhythm with normal S1, S2. Psychiatric this patient is able to make decisions and demonstrates good insight into disease process. Alert and Oriented x 3. pleasant and cooperative. Notes Patient's wound currently had no necrotic tissue require sharp debridement I was able to gently cleanse this with saline and gauze he is just a very small area remaining open at this time. There is no evidence of infection. Electronic  Signature(s) Signed: 05/02/2018 5:19:05 PM By: Worthy Keeler PA-C Entered By: Worthy Keeler on 05/02/2018 09:19:56 Roy [name], Roy [name] ([id number]) -------------------------------------------------------------------------------- Physician Orders Details Patient Name: Roy [name], Roy [name]. Date of Service: 05/02/2018 9:00 AM Medical Record Number: [id number] Patient Account Number: [id number] Date of Birth/Sex: Mar 30, 1932 (85 y.o. M) Treating RN: Cornell Barman Primary Care Provider: [name] Other Clinician: Referring Provider: [name] Treating Provider/Extender: Melburn Hake, HOYT Weeks in Treatment: 8 Verbal / Phone Orders: No Diagnosis Coding ICD-10 Coding Code Description W55.81XA Bitten by other mammals, initial encounter S51.801A Unspecified open wound of right forearm, initial encounter E11.622 Type 2 diabetes mellitus with other skin ulcer Wound Cleansing Wound #1 Right Forearm o Cleanse wound with mild soap and water - Clean wound with Dial antibacterial soap (pump), gently pat to dry. o May Shower, gently pat wound dry prior to applying new dressing. Primary Wound Dressing Wound #1 Right Forearm o Hydrafera Blue Ready Transfer Secondary Dressing Wound #1 Right Forearm o Dry Gauze o Conform/Kerlix o Coban Dressing Change Frequency Wound #1 Right Forearm o Change dressing every other day. Follow-up Appointments Wound #1 Right Forearm o Return Appointment in 2 weeks. Electronic Signature(s) Signed: 05/02/2018 5:19:05 PM By: Worthy Keeler PA-C Signed: 05/05/2018 4:00:49 PM By: Gretta Cool, BSN, RN, CWS, Kim RN, BSN Entered By: Gretta Cool, BSN, RN, CWS, Kim on 05/02/2018 09:16:25 Xie, Roy [name] ([id number]) -------------------------------------------------------------------------------- Problem List Details Patient Name: Lawley, Reubin [name]. Date of Service: 05/02/2018 9:00 AM Medical Record Number: [id number] Patient Account Number: [id number] Date of Birth/Sex:  22-Aug-1932 (85 y.o. M) Treating RN: Cornell Barman Primary Care Provider: [name] Other Clinician: Referring Provider: [name] Treating Provider/Extender: Melburn Hake, HOYT Weeks in Treatment: 8 Active Problems ICD-10 Evaluated Encounter Code Description  Active Date Today Diagnosis W55.81XA Bitten by other mammals, initial encounter 03/06/2018 No Yes S51.801A Unspecified open wound of right forearm, initial encounter 03/06/2018 No Yes E11.622 Type 2 diabetes mellitus with other skin ulcer 03/06/2018 No Yes Inactive Problems Resolved Problems Electronic Signature(s) Signed: 05/02/2018 5:19:05 PM By: Worthy Keeler PA-C Entered By: Worthy Keeler on 05/02/2018 09:10:27 Roy [name], Roy [name]. ([id number]) -------------------------------------------------------------------------------- Progress Note Details Patient Name: Roy [name], Roy [name]. Date of Service: 05/02/2018 9:00 AM Medical Record Number: [id number] Patient Account Number: [id number] Date of Birth/Sex: 04-16-1932 (85 y.o. M) Treating RN: Cornell Barman Primary Care Provider: [name] Other Clinician: Referring Provider: [name] Treating Provider/Extender: Melburn Hake, HOYT Weeks in Treatment: 8 Subjective Chief Complaint Information obtained from Patient Pollie Friar bite/Trauma right forearm History of Present Illness (HPI) 03/06/18 on evaluation today patient appears to be doing okay upon initial evaluation here in our clinic. He does have a skin tear which occurred on the right form which was a result of having been bitten by his daughter's donkey. He states that the animal is normally very mild tempered and in fact was not even mad when this occurred he had an [place] and was attempting to feed the donkey the donkey missed and biting for the [place] and got his arm through his coat. It did not pierce the coat but nonetheless just the pressure of the bite itself because the skin breakdown. Nonetheless he did have a significant skin  tear which required that he go to the hospital at the emergency department on Saturday, March 01, 2018. Subsequently he was given clindamycin 300 mg four times a day. He states that his the does have diabetes and is A1c is around 5.1 at the last check. No fevers chills noted he does tell me that they had a difficult time getting his breathing under control at the emergency department when he was there. Fortunately this does not seem to be waiting separately this point although it is going to require some debridement bleeding may become an issue. Otherwise the patient has no other major medical problems and aspirin is the only blood thinner that he is on at this point. 03/13/18 on evaluation today patient's arm appears to be doing significantly better which is good news. He's been tolerating the dressing changes without complication. Fortunately there is no sign of infection. No fevers, chills, nausea, or vomiting noted at this time. 03/20/18 on evaluation today patient's arm ulcer actually appears to be doing excellent at this time. This has a great granulation surface and has done extremely well with the [facility] Dressing for the last week in my pinion. There is no sign of infection. No fevers, chills, nausea, or vomiting noted at this time. 03/27/18 on evaluation today patient actually appears to be doing very well in regard to his right forearm ulcer. This appears to show signs of great improvement and though it is healing slowly it has fielding quite nicely and hopefully will start and continue to show signs of epithelialization over the next few weeks. No fevers, chills, nausea, or vomiting noted at this time. 04/03/18 on evaluation today patient's arm actually appears to be doing excellent at this point. He has been tolerating the dressing changes without complication. Fortunately there is no signs of infection at this time. Overall very pleased with how things are progressing. We did  have to show him which side of his [facility] Dressing goes down to the wound. He is now aware of how to do this  it was backwards when he came in today. 04/18/18 on evaluation today patient presents for follow-up concerning his right forearm ulcer. He's been tolerating the dressing changes without your for a blue with no complication. Fortunately there is no signs of infection at this time which is also good news. Overall I'm very pleased with the progress he's made since I last saw him. 05/02/18 on evaluation today patient actually appears to be doing rather well in regard to his right forearm ulcer. Overall I'm very pleased with his progress at this point he seems to be doing excellent. There's no signs of infection and this has improved dramatically just since two weeks ago. Patient History Information obtained from Patient. Syler, Eaven [name]. ([id number]) Family History Cancer - Siblings, Diabetes - Siblings, Heart Disease - Siblings, Kidney Disease - Siblings, No family history of Hereditary Spherocytosis, Hypertension, Lung Disease, Seizures, Stroke, Thyroid Problems, Tuberculosis. Social History Former smoker - ended on 03/05/1958, Marital Status - Married, Alcohol Use - Never, Drug Use - No History, Caffeine Use - Daily. Medical History Respiratory Patient has history of Sleep Apnea Cardiovascular Patient has history of Coronary Artery Disease, Hypertension Endocrine Patient has history of Type II Diabetes Medical And Surgical History Notes Hematologic/Lymphatic Thrombocytopenia Review of Systems (ROS) Constitutional Symptoms (General Health) Denies complaints or symptoms of Fever, Chills. Respiratory The patient has no complaints or symptoms. Cardiovascular The patient has no complaints or symptoms. Psychiatric The patient has no complaints or symptoms. Objective Constitutional Well-nourished and well-hydrated in no acute distress. Vitals Time Taken: 9:01 AM, Height: 69  in, Weight: 181 lbs, BMI: 26.7, Temperature: 97.9 F, Pulse: 65 bpm, Respiratory Rate: 16 breaths/min, Blood Pressure: 138/61 mmHg. Respiratory normal breathing without difficulty. clear to auscultation bilaterally. Cardiovascular regular rate and rhythm with normal S1, S2. Psychiatric this patient is able to make decisions and demonstrates good insight into disease process. Alert and Oriented x 3. pleasant and cooperative. Roy [name], Roy [name] ([id number]) General Notes: Patient's wound currently had no necrotic tissue require sharp debridement I was able to gently cleanse this with saline and gauze he is just a very small area remaining open at this time. There is no evidence of infection. Integumentary (Hair, Skin) Wound #1 status is Open. Original cause of wound was Trauma. The wound is located on the Right Forearm. The wound measures 0.3cm length x 0.4cm width x 0.1cm depth; 0.094cm^2 area and 0.009cm^3 volume. There is Fat Layer (Subcutaneous Tissue) Exposed exposed. There is no tunneling or undermining noted. There is a small amount of serous drainage noted. The wound margin is flat and intact. There is no granulation within the wound bed. There is a large (67-100%) amount of necrotic tissue within the wound bed including Eschar. The periwound skin appearance exhibited: Excoriation, Scarring, Hemosiderin Staining. The periwound skin appearance did not exhibit: Callus, Crepitus, Induration, Rash, Dry/Scaly, Maceration, Atrophie Blanche, Cyanosis, Ecchymosis, Mottled, Pallor, Rubor, Erythema. Periwound temperature was noted as No Abnormality. Assessment Active Problems ICD-10 Bitten by other mammals, initial encounter Unspecified open wound of right forearm, initial encounter Type 2 diabetes mellitus with other skin ulcer Plan Wound Cleansing: Wound #1 Right Forearm: Cleanse wound with mild soap and water - Clean wound with Dial antibacterial soap (pump), gently pat to dry. May Shower,  gently pat wound dry prior to applying new dressing. Primary Wound Dressing: Wound #1 Right Forearm: Hydrafera Blue Ready Transfer Secondary Dressing: Wound #1 Right Forearm: Dry Gauze Conform/Kerlix Coban Dressing Change Frequency: Wound #1 Right Forearm: Change dressing every other day.  Follow-up Appointments: Wound #1 Right Forearm: Return Appointment in 2 weeks. I'm in a recommend that we continue with the above wound care measures for the next week. Patient is in agreement with plan. If anything changes or worsens he will contact the office and let me know otherwise hopefully by the time we see him back in two weeks this will be completely healed. Roy [name], Roy [name]. ([id number]) Please see above for specific wound care orders. We will see patient for re-evaluation in 2 week(s) here in the clinic. If anything worsens or changes patient will contact our office for additional recommendations. Electronic Signature(s) Signed: 05/02/2018 5:19:05 PM By: Worthy Keeler PA-C Entered By: Worthy Keeler on 05/02/2018 09:20:14 Roy [name], Roy [name] ([id number]) -------------------------------------------------------------------------------- ROS/PFSH Details Patient Name: Roy [name], Roy [name]. Date of Service: 05/02/2018 9:00 AM Medical Record Number: [id number] Patient Account Number: [id number] Date of Birth/Sex: [birth year] (85 y.o. M) Treating RN: Cornell Barman Primary Care Provider: [name] Other Clinician: Referring Provider: [name] Treating Provider/Extender: Melburn Hake, HOYT Weeks in Treatment: 8 Information Obtained From Patient Wound History Do you currently have one or more open woundso Yes How many open wounds do you currently haveo 1 Approximately how long have you had your woundso March 01, 2018 How have you been treating your wound(s) until nowo coban, soap and water Has your wound(s) ever healed and then re-openedo No Have you had any lab work done in the past montho  No Have you tested positive for an antibiotic resistant organism (MRSA, VRE)o No Have you tested positive for osteomyelitis (bone infection)o No Have you had any tests for circulation on your legso No Have you had other problems associated with your woundso Swelling Constitutional Symptoms (General Health) Complaints and Symptoms: Negative for: Fever; Chills Hematologic/Lymphatic Medical History: Past Medical History Notes: Thrombocytopenia Respiratory Complaints and Symptoms: No Complaints or Symptoms Medical History: Positive for: Sleep Apnea Cardiovascular Complaints and Symptoms: No Complaints or Symptoms Medical History: Positive for: Coronary Artery Disease; Hypertension Endocrine Medical History: Positive for: Type II Diabetes Time with diabetes: 8 years Roy [name], Roy [name]. ([id number]) Psychiatric Complaints and Symptoms: No Complaints or Symptoms Immunizations Pneumococcal Vaccine: Received Pneumococcal Vaccination: Yes Implantable Devices No devices added Family and Social History Cancer: Yes - Siblings; Diabetes: Yes - Siblings; Heart Disease: Yes - Siblings; Hereditary Spherocytosis: No; Hypertension: No; Kidney Disease: Yes - Siblings; Lung Disease: No; Seizures: No; Stroke: No; Thyroid Problems: No; Tuberculosis: No; Former smoker - ended on 03/05/1958; Marital Status - Married; Alcohol Use: Never; Drug Use: No History; Caffeine Use: Daily; Financial Concerns: No; Food, Clothing or Shelter Needs: No; Support System Lacking: No; Transportation Concerns: No; Advanced Directives: No; Patient does not want information on Advanced Directives; Living Will: No; Medical Power of Attorney: No Physician Affirmation I have reviewed and agree with the above information. Electronic Signature(s) Signed: 05/02/2018 5:19:05 PM By: Worthy Keeler PA-C Signed: 05/05/2018 4:00:49 PM By: Gretta Cool, BSN, RN, CWS, Kim RN, BSN Entered By: Worthy Keeler on 05/02/2018 09:19:42 Roy [name], Roy [name] ([id number]) -------------------------------------------------------------------------------- SuperBill Details Patient Name: Vicknair, Rashid [name]. Date of Service: 05/02/2018 Medical Record Number: [id number] Patient Account Number: [id number] Date of Birth/Sex: 11-Apr-1932 (85 y.o. M) Treating RN: Cornell Barman Primary Care Provider: [name] Other Clinician: Referring Provider: [name] Treating Provider/Extender: Melburn Hake, HOYT Weeks in Treatment: 8 Diagnosis Coding ICD-10 Codes Code Description W55.81XA Bitten by other mammals, initial encounter S51.801A Unspecified open wound of right forearm, initial encounter E11.622 Type 2 diabetes mellitus with other skin  ulcer Facility Procedures CPT4 Code: [id number] Description: [phone] - WOUND CARE VISIT-LEV 2 EST PT Modifier: Quantity: 1 Physician Procedures CPT4 Code: [id number] Description: 94854 - WC PHYS LEVEL 4 - EST PT ICD-10 Diagnosis Description W55.81XA Bitten by other mammals, initial encounter S51.801A Unspecified open wound of right forearm, initial encounte E11.622 Type 2 diabetes mellitus with other skin ulcer Modifier: [name] Quantity: 1 Electronic Signature(s) Signed: 05/02/2018 5:19:05 PM By: Worthy Keeler PA-C Entered By: Worthy Keeler on 05/02/2018 09:20:44

## 2018-05-06 NOTE — Telephone Encounter (Signed)
Noted. FYI 

## 2018-05-06 NOTE — Progress Notes (Signed)
Roy [name] is a 85 y.o. male here for a new problem.   History of Present Illness:   Chief Complaint  Patient presents with  . Cough    x2 days, coughing up greenish flem  . Headache  . Generalized Body Aches  . Fever and chills    102 last night    HPI   Patient presents with productive cough, mild SOB, temperature of 100.2 max.  Symptoms started on Sunday.  He has a history of asthma, diabetes, COPD.  He currently has prescriptions for Symbicort and Albuterol.  He has been using his inhalers as well as Mucinex.  His wife reports that his cough is keeping her up all night.  He is diabetic, last hemoglobin A1c 2 weeks ago was 6.8.  He denies chest tightness, hemoptysis, wheezing.  He is followed by Dr. Lake Bells from pulmonology, last seen in July 2019.  Wife is with patient. She states that he has PNA x 2.  He is most concerned about his fatigue. Decreased appetite today.   Past Medical History:  Diagnosis Date  . Anginal pain ([place])   . Arthritis    spine  . Asthma   . CAD (coronary artery disease)    s/p PCI of the D1 with cath in 2013 showing nonobstructive disease.  CP presumed due to small vessel disease.  [name] CAP (community acquired pneumonia) 12/26/2011  . Chronic kidney disease    renal insufficiency  . Diabetes mellitus, type 2 ([place])   . Fever 12/26/2011  . GERD (gastroesophageal reflux disease)   . Heart murmur   . Hyperlipidemia   . Hypertension   . Lyme disease    states had shakes that were thought to be parkinson's related but related to tick bite, states also RMSF  . Myocardial infarction ([place])   . Shortness of breath   . Sleep apnea    uses cpap     Social History   Socioeconomic History  . Marital status: Married    Spouse name: Not on file  . Number of children: Not on file  . Years of education: Not on file  . Highest education level: Not on file  Occupational History  . Not on file  Social Needs  . Financial resource strain: Not on file   . Food insecurity:    Worry: Not on file    Inability: Not on file  . Transportation needs:    Medical: Not on file    Non-medical: Not on file  Tobacco Use  . Smoking status: Former Smoker    Packs/day: 1.50    Years: 8.00    Pack years: 12.00    Types: Cigarettes    Last attempt to quit: 03/05/1958    Years since quitting: 60.2  . Smokeless tobacco: Never Used  Substance and Sexual Activity  . Alcohol use: No    Alcohol/week: 0.0 standard drinks  . Drug use: No  . Sexual activity: Not Currently  Lifestyle  . Physical activity:    Days per week: Not on file    Minutes per session: Not on file  . Stress: Not on file  Relationships  . Social connections:    Talks on phone: Not on file    Gets together: Not on file    Attends religious service: Not on file    Active member of club or organization: Not on file    Attends meetings of clubs or organizations: Not on file    Relationship  status: Not on file  . Intimate partner violence:    Fear of current or ex partner: Not on file    Emotionally abused: Not on file    Physically abused: Not on file    Forced sexual activity: Not on file  Other Topics Concern  . Not on file  Social History Narrative   Married (wife patient of Dr. Yong Channel). 3 children. 4 grandchildren. 3 greatgrandchildren.       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 APPENDECTOMY    . CERVICAL LAMINECTOMY    . CHOLECYSTECTOMY    . ESOPHAGOGASTRODUODENOSCOPY N/A 04/04/2018   Procedure: ESOPHAGOGASTRODUODENOSCOPY (EGD);  Surgeon: Lin Landsman, MD;  Location[facility] ENDOSCOPY;  Service: Gastroenterology;  Laterality: N/A;  . KIDNEY STONE SURGERY    . LEFT HEART CATH N/A 02/15/2012   Procedure: LEFT HEART CATH;  Surgeon: Larey Dresser, MD;  Location: [facility] CATH LAB;  Service: Cardiovascular;  Laterality: N/A;    Family History  Problem  Relation Age of Onset  . ALS Father   . Asthma Daughter   . Cancer Sister        breast/liver    Allergies  Allergen Reactions  . Codeine Sulfate Other (See Comments)    Chest pain  . Crestor [Rosuvastatin Calcium]   . Cephalexin Rash    Current Medications:   Current Outpatient Medications:  .  acetaminophen (TYLENOL) 325 MG tablet, Take 650 mg by mouth every evening. Reported on 06/28/2015, Disp: , Rfl:  .  albuterol (PROVENTIL HFA;VENTOLIN HFA) 108 (90 Base) MCG/ACT inhaler, Inhale 2 puffs into the lungs every 4 (four) hours as needed for wheezing or shortness of breath., Disp: 1 Inhaler, Rfl: 10 .  amLODipine (NORVASC) 2.5 MG tablet, Take 1 tablet (2.5 mg total) by mouth daily., Disp: 90 tablet, Rfl: 3 .  aspirin EC 81 MG tablet, Take 1 tablet (81 mg total) by mouth daily., Disp: , Rfl:  .  ASSURE COMFORT LANCETS 30G MISC, 1 each by Other route daily., Disp: , Rfl:  .  atorvastatin (LIPITOR) 10 MG tablet, Take 1 tablet (10 mg total) by mouth daily., Disp: 90 tablet, Rfl: 3 .  budesonide-formoterol (SYMBICORT) 160-4.5 MCG/ACT inhaler, Inhale 2 puffs into the lungs 2 (two) times daily., Disp: 10.2 g, Rfl: 10 .  calcium-vitamin D (OSCAL WITH D) 500-200 MG-UNIT per tablet, Take 1 tablet by mouth daily., Disp: , Rfl:  .  glimepiride (AMARYL) 4 MG tablet, Take 1 tablet (4 mg total) by mouth daily with breakfast., Disp: 90 tablet, Rfl: 3 .  glucose blood (ACCU-CHEK AVIVA PLUS) test strip, Test blood glucose daily., Disp: 100 each, Rfl: 12 .  glucose blood test strip, One Touch Ultra Test Strips, Disp: 100 each, Rfl: 12 .  guaiFENesin (MUCINEX) 600 MG 12 hr tablet, Take by mouth 2 (two) times daily., Disp: , Rfl:  .  isosorbide mononitrate (IMDUR) 30 MG 24 hr tablet, TAKE 3 TABLETS BY MOUTH ONCE DAILY, Disp: 270 tablet, Rfl: 3 .  lisinopril (PRINIVIL,ZESTRIL) 5 MG tablet, TAKE 1/2 (ONE-HALF) TABLET BY MOUTH ONCE DAILY, Disp: 45 tablet, Rfl: 1 .  metFORMIN (GLUCOPHAGE) 500 MG tablet, TAKE  1/2 (ONE-HALF) TABLET BY MOUTH ONCE DAILY WITH BREAKFAST, Disp: 45 tablet, Rfl: 1 .  nitroGLYCERIN (NITROSTAT) 0.4 MG SL tablet, Place 1 tablet (0.4 mg total) under the tongue every 5 (five) minutes as  needed for chest pain., Disp: 25 tablet, Rfl: 1 .  Omega-3 Fatty Acids (FISH OIL PO), Take 2 capsules by mouth 2 (two) times daily., Disp: , Rfl:  .  omeprazole (PRILOSEC) 20 MG capsule, Take 1 capsule (20 mg total) by mouth 2 (two) times daily., Disp: 60 capsule, Rfl: 1 .  OVER THE COUNTER MEDICATION, Take 1 tablet by mouth at bedtime. Legatrim PM, Disp: , Rfl:  .  Polyethyl Glycol-Propyl Glycol (SYSTANE OP), Place 1 drop into both eyes 4 (four) times daily. Itchy/dry eye, Disp: , Rfl:  .  potassium citrate (UROCIT-K) 10 MEQ (1080 MG) SR tablet, Take 10 mEq by mouth Twice daily., Disp: , Rfl:  .  ranolazine (RANEXA) 500 MG 12 hr tablet, Take 1 tablet (500 mg total) by mouth 2 (two) times daily., Disp: 180 tablet, Rfl: 3 .  traMADol (ULTRAM) 50 MG tablet, Take 1 tablet (50 mg total) by mouth every 6 (six) hours as needed for moderate pain (twice per day maximum)., Disp: 60 tablet, Rfl: 2 .  traZODone (DESYREL) 150 MG tablet, TAKE 1 TABLET BY MOUTH ONCE DAILY, Disp: 90 tablet, Rfl: 0 .  zinc sulfate 220 MG capsule, Take 220 mg by mouth daily., Disp: , Rfl:  .  doxycycline (VIBRA-TABS) 100 MG tablet, Take 1 tablet (100 mg total) by mouth 2 (two) times daily., Disp: 20 tablet, Rfl: 0 .  predniSONE (DELTASONE) 5 MG tablet, 6-5-4-3-2-1-off, Disp: 21 tablet, Rfl: 0   Review of Systems:   Review of Systems  Constitutional: Positive for fever. Negative for chills, malaise/fatigue and weight loss.  HENT: Positive for congestion and sore throat.   Respiratory: Positive for cough, sputum production, shortness of breath and wheezing. Negative for hemoptysis.   Cardiovascular: Negative for chest pain, orthopnea, claudication and leg swelling.  Gastrointestinal: Negative for heartburn, nausea and vomiting.   Neurological: Negative for dizziness, tingling and headaches.    Vitals:   Vitals:   05/06/18 1557  BP: 140/70  Pulse: 75  Temp: (!) 101 F (38.3 C)  TempSrc: Oral  SpO2: 94%  Weight: 186 lb (84.4 kg)  Height: 5\' 9"  (1.753 m)     Body mass index is 27.47 kg/m.  Physical Exam:   Physical Exam Vitals signs and nursing note reviewed.  Constitutional:      General: He is not in acute distress.    Appearance: He is well-developed. He is not ill-appearing or toxic-appearing.  HENT:     Head: Normocephalic and atraumatic.     Right Ear: Tympanic membrane, ear canal and external ear normal. Tympanic membrane is not erythematous, retracted or bulging.     Left Ear: Tympanic membrane, ear canal and external ear normal. Tympanic membrane is not erythematous, retracted or bulging.     Nose: Mucosal edema, congestion and rhinorrhea present.     Right Sinus: No maxillary sinus tenderness or frontal sinus tenderness.     Left Sinus: No maxillary sinus tenderness or frontal sinus tenderness.     Mouth/Throat:     Lips: Pink.     Mouth: Mucous membranes are moist.     Pharynx: Uvula midline. Posterior oropharyngeal erythema present.     Tonsils: Swelling: 0 on the right. 0 on the left.  Eyes:     General: Lids are normal.     Conjunctiva/sclera: Conjunctivae normal.  Neck:     Trachea: Trachea normal.  Cardiovascular:     Rate and Rhythm: Normal rate and regular rhythm.     Heart  sounds: Normal heart sounds, S1 normal and S2 normal.  Pulmonary:     Effort: Pulmonary effort is normal. No bradypnea, accessory muscle usage or prolonged expiration.     Breath sounds: Examination of the right-upper field reveals rhonchi. Examination of the right-middle field reveals wheezing and rhonchi. Examination of the right-lower field reveals wheezing and rhonchi. Wheezing and rhonchi present. No decreased breath sounds or rales.  Lymphadenopathy:     Cervical: No cervical adenopathy.  Skin:     General: Skin is warm and dry.  Neurological:     Mental Status: He is alert.  Psychiatric:        Speech: Speech normal.        Behavior: Behavior normal. Behavior is cooperative.    Results for orders placed or performed in visit on 05/06/18  POC Influenza A&B(BINAX/QUICKVUE)  Result Value Ref Range   Influenza A, POC Negative Negative   Influenza B, POC Negative Negative  POCT glucose (manual entry)  Result Value Ref Range   POC Glucose 146 (A) 70 - 99 mg/dl     Assessment and Plan:   Roy [name] was seen today for cough, headache, generalized body aches and fever and chills.  Diagnoses and all orders for this visit:  Flu-like symptoms Flu swab is negative. Official chest xray pending but possible early infiltrate in RUL? Will treat for COPD exacerbation with oral prednisone taper, doxycyline, continued use of inhalers, and follow-up with me or Dr. Yong Channel on Friday for lung recheck. ER precautions discussed. -     POC Influenza A&B(BINAX/QUICKVUE) -     DG Chest 2 View; Future -     DG Chest 2 View  Type 2 diabetes mellitus with stage 3 chronic kidney disease, without long-term current use of insulin (HCC) Blood sugar in office 146. Discussed need for adequate hydration and consistent, controlled CHO intake. -     POCT glucose (manual entry)  Other orders -     predniSONE (DELTASONE) 5 MG tablet; 6-5-4-3-2-1-off -     doxycycline (VIBRA-TABS) 100 MG tablet; Take 1 tablet (100 mg total) by mouth 2 (two) times daily.    . Reviewed expectations re: course of current medical issues. . Discussed self-management of symptoms. . Outlined signs and symptoms indicating need for more acute intervention. . Patient verbalized understanding and all questions were answered. . See orders for this visit as documented in the electronic medical record. . Patient received an After-Visit Summary.  Inda Coke, PA-C

## 2018-05-06 NOTE — Patient Instructions (Signed)
Start doxycycline and prednisone.  Please come back and see me on Friday.  Push fluids and get plenty of rest. Please return if you are not improving as expected, or if you have high fevers (>101.5) or difficulty swallowing or worsening productive cough.  Call clinic with questions.  I hope you start feeling better soon!

## 2018-05-07 ENCOUNTER — Other Ambulatory Visit: Payer: Self-pay | Admitting: Family Medicine

## 2018-05-09 ENCOUNTER — Encounter: Payer: Self-pay | Admitting: Physician Assistant

## 2018-05-09 ENCOUNTER — Ambulatory Visit (INDEPENDENT_AMBULATORY_CARE_PROVIDER_SITE_OTHER): Payer: PPO | Admitting: Physician Assistant

## 2018-05-09 VITALS — BP 139/70 | HR 59 | Temp 97.8°F | Ht 69.0 in | Wt 184.5 lb

## 2018-05-09 DIAGNOSIS — J181 Lobar pneumonia, unspecified organism: Secondary | ICD-10-CM

## 2018-05-09 DIAGNOSIS — S32010A Wedge compression fracture of first lumbar vertebra, initial encounter for closed fracture: Secondary | ICD-10-CM | POA: Diagnosis not present

## 2018-05-09 DIAGNOSIS — J189 Pneumonia, unspecified organism: Secondary | ICD-10-CM

## 2018-05-09 NOTE — Patient Instructions (Signed)
It was great to see you!  1. Follow up with Dr. Yong Channel in 4-6 weeks for a follow-up to repeat your chest xray to make sure the pneumonia has gone away, sooner if ANY issues.  2. Please make an appointment here in our office with Dr. Paulla Fore to evaluate your lower back fracture in about 2 weeks or so -- sooner if ANY issues.  Push fluids and get plenty of rest. Please return if you are not improving as expected, or if you have high fevers (>101.5) or difficulty swallowing or worsening productive cough.   Inda Coke PA-C

## 2018-05-09 NOTE — Progress Notes (Signed)
Roy [name] is a 85 y.o. male here for a follow up of a pre-existing problem.   History of Present Illness:   Chief Complaint  Patient presents with  . Cough f/u    yellowish    HPI   RUL PNA -- patient is here to follow-up on PNA. He was diagnosed by xray on 05/06/2018. He is currently on doxycycline and oral prednisone and tolerating well. He has been doing very well since he was last here -- cough is improving. Afebrile. Denies SOB or chest pain. Great appetite. Wife is present and confirms that he is doing well.  L1 compression fracture -- incidental finding on CXR performed 05/06/18. He denies any pain or known trauma. Denies any numbness, tingling, loss of bowel/bladder function. No known hx of CA.   Past Medical History:  Diagnosis Date  . Anginal pain ([name])   . Arthritis    spine  . Asthma   . CAD (coronary artery disease)    s/p PCI of the D1 with cath in 2013 showing nonobstructive disease.  CP presumed due to small vessel disease.  [name] CAP (community acquired pneumonia) 12/26/2011  . Chronic kidney disease    renal insufficiency  . Diabetes mellitus, type 2 ([place])   . Fever 12/26/2011  . GERD (gastroesophageal reflux disease)   . Heart murmur   . Hyperlipidemia   . Hypertension   . Lyme disease    states had shakes that were thought to be parkinson's related but related to tick bite, states also RMSF  . Myocardial infarction ([place])   . Shortness of breath   . Sleep apnea    uses cpap     Social History   Socioeconomic History  . Marital status: Married    Spouse name: Not on file  . Number of children: Not on file  . Years of education: Not on file  . Highest education level: Not on file  Occupational History  . Not on file  Social Needs  . Financial resource strain: Not on file  . Food insecurity:    Worry: Not on file    Inability: Not on file  . Transportation needs:    Medical: Not on file    Non-medical: Not on file  Tobacco Use  . Smoking  status: Former Smoker    Packs/day: 1.50    Years: 8.00    Pack years: 12.00    Types: Cigarettes    Last attempt to quit: 03/05/1958    Years since quitting: 60.2  . Smokeless tobacco: Never Used  Substance and Sexual Activity  . Alcohol use: No    Alcohol/week: 0.0 standard drinks  . Drug use: No  . Sexual activity: Not Currently  Lifestyle  . Physical activity:    Days per week: Not on file    Minutes per session: Not on file  . Stress: Not on file  Relationships  . Social connections:    Talks on phone: Not on file    Gets together: Not on file    Attends religious service: Not on file    Active member of club or organization: Not on file    Attends meetings of clubs or organizations: Not on file    Relationship status: Not on file  . Intimate partner violence:    Fear of current or ex partner: Not on file    Emotionally abused: Not on file    Physically abused: Not on file    Forced sexual  activity: Not on file  Other Topics Concern  . Not on file  Social History Narrative   Married (wife patient of Dr. Yong Channel). 3 children. 4 grandchildren. 3 greatgrandchildren.       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 APPENDECTOMY    . CERVICAL LAMINECTOMY    . CHOLECYSTECTOMY    . ESOPHAGOGASTRODUODENOSCOPY N/A 04/04/2018   Procedure: ESOPHAGOGASTRODUODENOSCOPY (EGD);  Surgeon: Lin Landsman, MD;  Location: [facility] ENDOSCOPY;  Service: Gastroenterology;  Laterality: N/A;  . KIDNEY STONE SURGERY    . LEFT HEART CATH N/A 02/15/2012   Procedure: LEFT HEART CATH;  Surgeon: Larey Dresser, MD;  Location[facility] CATH LAB;  Service: Cardiovascular;  Laterality: N/A;    Family History  Problem Relation Age of Onset  . ALS Father   . Asthma Daughter   . Cancer Sister        breast/liver    Allergies  Allergen Reactions  . Codeine Sulfate Other (See Comments)     Chest pain  . Crestor [Rosuvastatin Calcium]   . Cephalexin Rash    Current Medications:   Current Outpatient Medications:  .  acetaminophen (TYLENOL) 325 MG tablet, Take 650 mg by mouth every evening. Reported on 06/28/2015, Disp: , Rfl:  .  albuterol (PROVENTIL HFA;VENTOLIN HFA) 108 (90 Base) MCG/ACT inhaler, Inhale 2 puffs into the lungs every 4 (four) hours as needed for wheezing or shortness of breath., Disp: 1 Inhaler, Rfl: 10 .  amLODipine (NORVASC) 2.5 MG tablet, Take 1 tablet (2.5 mg total) by mouth daily., Disp: 90 tablet, Rfl: 3 .  aspirin EC 81 MG tablet, Take 1 tablet (81 mg total) by mouth daily., Disp: , Rfl:  .  ASSURE COMFORT LANCETS 30G MISC, 1 each by Other route daily., Disp: , Rfl:  .  atorvastatin (LIPITOR) 10 MG tablet, Take 1 tablet by mouth once daily, Disp: 90 tablet, Rfl: 0 .  budesonide-formoterol (SYMBICORT) 160-4.5 MCG/ACT inhaler, Inhale 2 puffs into the lungs 2 (two) times daily., Disp: 10.2 g, Rfl: 10 .  calcium-vitamin D (OSCAL WITH D) 500-200 MG-UNIT per tablet, Take 1 tablet by mouth daily., Disp: , Rfl:  .  doxycycline (VIBRA-TABS) 100 MG tablet, Take 1 tablet (100 mg total) by mouth 2 (two) times daily., Disp: 20 tablet, Rfl: 0 .  glimepiride (AMARYL) 4 MG tablet, Take 1 tablet (4 mg total) by mouth daily with breakfast., Disp: 90 tablet, Rfl: 3 .  glucose blood (ACCU-CHEK AVIVA PLUS) test strip, Test blood glucose daily., Disp: 100 each, Rfl: 12 .  glucose blood test strip, One Touch Ultra Test Strips, Disp: 100 each, Rfl: 12 .  guaiFENesin (MUCINEX) 600 MG 12 hr tablet, Take by mouth 2 (two) times daily., Disp: , Rfl:  .  isosorbide mononitrate (IMDUR) 30 MG 24 hr tablet, TAKE 3 TABLETS BY MOUTH ONCE DAILY, Disp: 270 tablet, Rfl: 3 .  lisinopril (PRINIVIL,ZESTRIL) 5 MG tablet, TAKE 1/2 (ONE-HALF) TABLET BY MOUTH ONCE DAILY, Disp: 45 tablet, Rfl: 1 .  metFORMIN (GLUCOPHAGE) 500 MG tablet, TAKE 1/2 (ONE-HALF) TABLET BY MOUTH ONCE DAILY WITH BREAKFAST, Disp:  45 tablet, Rfl: 1 .  nitroGLYCERIN (NITROSTAT) 0.4 MG SL tablet, Place 1 tablet (0.4 mg total) under the tongue every 5 (five) minutes as needed for chest pain., Disp: 25 tablet, Rfl: 1 .  Omega-3 Fatty Acids (FISH OIL PO), Take 2 capsules  by mouth 2 (two) times daily., Disp: , Rfl:  .  omeprazole (PRILOSEC) 20 MG capsule, Take 1 capsule (20 mg total) by mouth 2 (two) times daily., Disp: 60 capsule, Rfl: 1 .  OVER THE COUNTER MEDICATION, Take 1 tablet by mouth at bedtime. Legatrim PM, Disp: , Rfl:  .  Polyethyl Glycol-Propyl Glycol (SYSTANE OP), Place 1 drop into both eyes 4 (four) times daily. Itchy/dry eye, Disp: , Rfl:  .  potassium citrate (UROCIT-K) 10 MEQ (1080 MG) SR tablet, Take 10 mEq by mouth Twice daily., Disp: , Rfl:  .  predniSONE (DELTASONE) 5 MG tablet, 6-5-4-3-2-1-off, Disp: 21 tablet, Rfl: 0 .  ranolazine (RANEXA) 500 MG 12 hr tablet, Take 1 tablet (500 mg total) by mouth 2 (two) times daily., Disp: 180 tablet, Rfl: 3 .  traMADol (ULTRAM) 50 MG tablet, Take 1 tablet (50 mg total) by mouth every 6 (six) hours as needed for moderate pain (twice per day maximum)., Disp: 60 tablet, Rfl: 2 .  traZODone (DESYREL) 150 MG tablet, TAKE 1 TABLET BY MOUTH ONCE DAILY, Disp: 90 tablet, Rfl: 0 .  zinc sulfate 220 MG capsule, Take 220 mg by mouth daily., Disp: , Rfl:    Review of Systems:   Review of Systems  Constitutional: Negative for chills, fever, malaise/fatigue and weight loss.  HENT: Negative for congestion, ear pain, sinus pain and sore throat.   Respiratory: Positive for cough. Negative for sputum production and shortness of breath.   Cardiovascular: Negative for chest pain, orthopnea, claudication and leg swelling.  Gastrointestinal: Negative for heartburn, nausea and vomiting.  Musculoskeletal: Negative for back pain and neck pain.  Neurological: Negative for dizziness, tingling and headaches.    Vitals:   Vitals:   05/09/18 1010  BP: 139/70  Pulse: (!) 59  Temp: 97.8 F  (36.6 C)  TempSrc: Oral  SpO2: 97%  Weight: 184 lb 8 oz (83.7 kg)  Height: 5\' 9"  (1.753 m)     Body mass index is 27.25 kg/m.  Physical Exam:   Physical Exam Vitals signs and nursing note reviewed.  Constitutional:      General: He is not in acute distress.    Appearance: He is well-developed. He is not ill-appearing or toxic-appearing.  Cardiovascular:     Rate and Rhythm: Normal rate and regular rhythm.     Pulses: Normal pulses.     Heart sounds: Normal heart sounds, S1 normal and S2 normal.     Comments: No LE edema Pulmonary:     Effort: Pulmonary effort is normal.     Breath sounds: Examination of the right-upper field reveals decreased breath sounds and rhonchi. Examination of the right-middle field reveals decreased breath sounds and rhonchi. Decreased breath sounds and rhonchi present.     Comments: Lung exam on R much improved from prior visit earlier this week Skin:    General: Skin is warm and dry.  Neurological:     Mental Status: He is alert.     GCS: GCS eye subscore is 4. GCS verbal subscore is 5. GCS motor subscore is 6.  Psychiatric:        Speech: Speech normal.        Behavior: Behavior normal. Behavior is cooperative.      Assessment and Plan:   Latavius was seen today for cough f/u.  Diagnoses and all orders for this visit:  Pneumonia of right upper lobe due to infectious organism [facility]) Continue regimen. Patient is doing very well. Discussed follow-up with  me or Dr. Yong Channel in 4-6 weeks to repeat CXR and ensure resolution -- sooner if symptoms.  Compression fracture of L1 vertebra, initial encounter ([place]) Currently without pain or any symptoms. Will send to Dr. Paulla Fore for further evaluation and treatment.  . Reviewed expectations re: course of current medical issues. . Discussed self-management of symptoms. . Outlined signs and symptoms indicating need for more acute intervention. . Patient verbalized understanding and all questions were  answered. . See orders for this visit as documented in the electronic medical record. . Patient received an After-Visit Summary.  Inda Coke, PA-C

## 2018-05-09 NOTE — Addendum Note (Signed)
Addended by: Erlene Quan on: 05/09/2018 12:38 PM   Modules accepted: Orders

## 2018-05-12 NOTE — Progress Notes (Signed)
Kalbfleisch, PATTON RABINOVICH ([id number]) Visit Report for 04/03/2018 Arrival Information Details Patient Name: Roy [name]. Date of Service: 04/03/2018 9:15 AM Medical Record Number: [id number] Patient Account Number: [id number] Date of Birth/Sex: [birth year] (85 y.o. M) Treating RN: Harold Barban Primary Care Raya Mckinstry: [name] Other Clinician: Referring Bolden Hagerman: [name] Treating Mekaylah Klich/Extender: Melburn Hake, HOYT Weeks in Treatment: 4 Visit Information History Since Last Visit Added or deleted any medications: No Patient Arrived: Ambulatory Any new allergies or adverse reactions: No Arrival Time: 09:18 Had a fall or experienced change in No Accompanied By: wife activities of daily living that may affect Transfer Assistance: None risk of falls: Patient Identification Verified: Yes Signs or symptoms of abuse/neglect since last visito No Secondary Verification Process Completed: Yes Hospitalized since last visit: No Implantable device outside of the clinic excluding No cellular tissue based products placed in the center since last visit: Has Dressing in Place as Prescribed: Yes Pain Present Now: No Electronic Signature(s) Signed: 04/03/2018 11:23:59 AM By: Lorine Bears RCP, RRT, CHT Entered By: Lorine Bears on 04/03/2018 09:18:40 Demorest, Delorean R. ([id number]) -------------------------------------------------------------------------------- Clinic Level of Care Assessment Details Patient Name: [name], [name] Date of Service: 04/03/2018 9:15 AM Medical Record Number: [id number] Patient Account Number: [id number] Date of Birth/Sex: 07-08-1932 (85 y.o. M) Treating RN: Harold Barban Primary Care Ashe Graybeal: [name] Other Clinician: Referring Morrison Mcbryar: [name] Treating Kamyra Schroeck/Extender: Melburn Hake, HOYT Weeks in Treatment: 4 Clinic Level of Care Assessment Items TOOL 4 Quantity Score []  - Use when only an EandM is performed on  FOLLOW-UP visit 0 ASSESSMENTS - Nursing Assessment / Reassessment X - Reassessment of Co-morbidities (includes updates in patient status) 1 10 X- 1 5 Reassessment of Adherence to Treatment Plan ASSESSMENTS - Wound and Skin Assessment / Reassessment X - Simple Wound Assessment / Reassessment - one wound 1 5 []  - 0 Complex Wound Assessment / Reassessment - multiple wounds []  - 0 Dermatologic / Skin Assessment (not related to wound area) ASSESSMENTS - Focused Assessment []  - Circumferential Edema Measurements - multi extremities 0 []  - 0 Nutritional Assessment / Counseling / Intervention []  - 0 Lower Extremity Assessment (monofilament, tuning fork, pulses) []  - 0 Peripheral Arterial Disease Assessment (using hand held doppler) ASSESSMENTS - Ostomy and/or Continence Assessment and Care []  - Incontinence Assessment and Management 0 []  - 0 Ostomy Care Assessment and Management (repouching, etc.) PROCESS - Coordination of Care X - Simple Patient / Family Education for ongoing care 1 15 []  - 0 Complex (extensive) Patient / Family Education for ongoing care X- 1 10 Staff obtains Programmer, systems, Records, Test Results / Process Orders []  - 0 Staff telephones HHA, Nursing Homes / Clarify orders / etc []  - 0 Routine Transfer to another Facility (non-emergent condition) []  - 0 Routine Hospital Admission (non-emergent condition) []  - 0 New Admissions / Biomedical engineer / Ordering NPWT, Apligraf, etc. []  - 0 Emergency Hospital Admission (emergent condition) X- 1 10 Simple Discharge Coordination Holloran, Valin R. ([id number]) []  - 0 Complex (extensive) Discharge Coordination PROCESS - Special Needs []  - Pediatric / Minor Patient Management 0 []  - 0 Isolation Patient Management []  - 0 Hearing / Language / Visual special needs []  - 0 Assessment of Community assistance (transportation, D/C planning, etc.) []  - 0 Additional assistance / Altered mentation []  - 0 Support Surface(s)  Assessment (bed, cushion, seat, etc.) INTERVENTIONS - Wound Cleansing / Measurement X - Simple Wound Cleansing - one wound 1 5 []  - 0 Complex Wound Cleansing - multiple wounds  X- 1 5 Wound Imaging (photographs - any number of wounds) []  - 0 Wound Tracing (instead of photographs) X- 1 5 Simple Wound Measurement - one wound []  - 0 Complex Wound Measurement - multiple wounds INTERVENTIONS - Wound Dressings X - Small Wound Dressing one or multiple wounds 1 10 []  - 0 Medium Wound Dressing one or multiple wounds []  - 0 Large Wound Dressing one or multiple wounds []  - 0 Application of Medications - topical []  - 0 Application of Medications - injection INTERVENTIONS - Miscellaneous []  - External ear exam 0 []  - 0 Specimen Collection (cultures, biopsies, blood, body fluids, etc.) []  - 0 Specimen(s) / Culture(s) sent or taken to Lab for analysis []  - 0 Patient Transfer (multiple staff / Civil Service fast streamer / Similar devices) []  - 0 Simple Staple / Suture removal (25 or less) []  - 0 Complex Staple / Suture removal (26 or more) []  - 0 Hypo / Hyperglycemic Management (close monitor of Blood Glucose) []  - 0 Ankle / Brachial Index (ABI) - do not check if billed separately X- 1 5 Vital Signs Sublett, Daron R. ([id number]) Has the patient been seen at the hospital within the last three years: Yes Total Score: 85 Level Of Care: New/Established - Level 3 Electronic Signature(s) Signed: 05/12/2018 10:53:34 AM By: Harold Barban Entered By: Harold Barban on 04/03/2018 09:59:11 Kope, Pincus Sanes ([id number]) -------------------------------------------------------------------------------- Encounter Discharge Information Details Patient Name: [name], [name] Date of Service: 04/03/2018 9:15 AM Medical Record Number: [id number] Patient Account Number: [id number] Date of Birth/Sex: [birth year] (85 y.o. M) Treating RN: Harold Barban Primary Care Anibal Quinby: [name] Other Clinician: Referring  Rakeen Gaillard: [name] Treating Jinnifer Montejano/Extender: Melburn Hake, HOYT Weeks in Treatment: 4 Encounter Discharge Information Items Discharge Condition: Stable Ambulatory Status: Ambulatory Discharge Destination: Home Transportation: Private Auto Accompanied By: wife Schedule Follow-up Appointment: Yes Clinical Summary of Care: Electronic Signature(s) Signed: 05/12/2018 10:53:34 AM By: Harold Barban Entered By: Harold Barban on 04/03/2018 10:06:38 Okazaki, Pincus Sanes ([id number]) -------------------------------------------------------------------------------- Lower Extremity Assessment Details Patient Name: Piccini, Joao R. Date of Service: 04/03/2018 9:15 AM Medical Record Number: [id number] Patient Account Number: [id number] Date of Birth/Sex: 07-11-1932 (85 y.o. M) Treating RN: Montey Hora Primary Care Eretria Manternach: [name] Other Clinician: Referring Trenese Haft: [name] Treating Elesia Pemberton/Extender: Melburn Hake, HOYT Weeks in Treatment: 4 Electronic Signature(s) Signed: 04/03/2018 5:03:44 PM By: Montey Hora Entered By: Montey Hora on 04/03/2018 09:28:45 Jorgenson, Jayjay R. ([id number]) -------------------------------------------------------------------------------- Multi Wound Chart Details Patient Name: Cedillos, Montrail R. Date of Service: 04/03/2018 9:15 AM Medical Record Number: [id number] Patient Account Number: [id number] Date of Birth/Sex: 04/14/1932 (85 y.o. M) Treating RN: Harold Barban Primary Care Lilya Smitherman: [name] Other Clinician: Referring Adeola Dennen: [name] Treating Selita Staiger/Extender: Melburn Hake, HOYT Weeks in Treatment: 4 Vital Signs Height(in): 69 Pulse(bpm): 55 Weight(lbs): 181 Blood Pressure(mmHg): 138/74 Body Mass Index(BMI): 27 Temperature(F): 97.6 Respiratory Rate 16 (breaths/min): Photos: [N/A:N/A] Wound Location: Right Forearm N/A N/A Wounding Event: Trauma N/A N/A Primary Etiology: Trauma, Other N/A N/A Comorbid History:  Sleep Apnea, Coronary Artery N/A N/A Disease, Hypertension, Type II Diabetes Date Acquired: 03/01/2018 N/A N/A Weeks of Treatment: 4 N/A N/A Wound Status: Open N/A N/A Measurements L x W x D 3.1x1.5x0.1 N/A N/A (cm) Area (cm) : 3.652 N/A N/A Volume (cm) : 0.365 N/A N/A % Reduction in Area: 73.40% N/A N/A % Reduction in Volume: 73.40% N/A N/A Classification: Full Thickness Without N/A N/A Exposed Support Structures Exudate Amount: Large N/A N/A Exudate Type: Serosanguineous N/A N/A Exudate Color: red, brown N/A N/A  Wound Margin: Flat and Intact N/A N/A Granulation Amount: Large (67-100%) N/A N/A Granulation Quality: Red, Pink N/A N/A Necrotic Amount: None Present (0%) N/A N/A Exposed Structures: Fat Layer (Subcutaneous N/A N/A Tissue) Exposed: Yes Fascia: No Tendon: No Muscle: No Gartrell, Jarret R. ([id number]) Joint: No Bone: No Epithelialization: Small (1-33%) N/A N/A Periwound Skin Texture: Excoriation: Yes N/A N/A Induration: No Callus: No Crepitus: No Rash: No Scarring: No Periwound Skin Moisture: Maceration: No N/A N/A Dry/Scaly: No Periwound Skin Color: Hemosiderin Staining: Yes N/A N/A Atrophie Blanche: No Cyanosis: No Ecchymosis: No Erythema: No Mottled: No Pallor: No Rubor: No Temperature: No Abnormality N/A N/A Tenderness on Palpation: No N/A N/A Wound Preparation: Ulcer Cleansing: N/A N/A Rinsed/Irrigated with Saline Topical Anesthetic Applied: Other: lidocaine 4% Treatment Notes Electronic Signature(s) Signed: 05/12/2018 10:53:34 AM By: Harold Barban Entered By: Harold Barban on 04/03/2018 09:58:31 Royce, Keats R. ([id number]) -------------------------------------------------------------------------------- Pain Assessment Details Patient Name: Maino, Marwin R. Date of Service: 04/03/2018 9:15 AM Medical Record Number: [id number] Patient Account Number: [id number] Date of Birth/Sex: 14-Jul-1932 (85 y.o. M) Treating RN: Harold Barban Primary  Care Jeselle Hiser: [name] Other Clinician: Referring Real Cona: [name] Treating Anaysha Andre/Extender: Melburn Hake, HOYT Weeks in Treatment: 4 Active Problems Location of Pain Severity and Description of Pain Patient Has Paino No Site Locations Pain Management and Medication Current Pain Management: Electronic Signature(s) Signed: 04/03/2018 11:23:59 AM By: Lorine Bears RCP, RRT, CHT Signed: 05/12/2018 10:53:34 AM By: Harold Barban Entered By: Lorine Bears on 04/03/2018 09:18:48 Sisemore, Eagle Alfonso Patten ([id number]) -------------------------------------------------------------------------------- Patient/Caregiver Education Details Patient Name: Thammavong, Jonn R. Date of Service: 04/03/2018 9:15 AM Medical Record Number: [id number] Patient Account Number: [id number] Date of Birth/Gender: 12-15-1932 (85 y.o. M) Treating RN: Harold Barban Primary Care Physician: [name] Other Clinician: Referring Physician: [name] Treating Physician/Extender: Sharalyn Ink in Treatment: 4 Education Assessment Education Provided To: Patient Education Topics Provided Wound/Skin Impairment: Handouts: Caring for Your Ulcer Methods: Demonstration, Explain/Verbal Responses: State content correctly Electronic Signature(s) Signed: 05/12/2018 10:53:34 AM By: Harold Barban Entered By: Harold Barban on 04/03/2018 10:00:18 Bramblett, Aravind RMarland Kitchen ([id number]) -------------------------------------------------------------------------------- Wound Assessment Details Patient Name: Slight, Franz R. Date of Service: 04/03/2018 9:15 AM Medical Record Number: [id number] Patient Account Number: [id number] Date of Birth/Sex: 1933-01-05 (85 y.o. M) Treating RN: Montey Hora Primary Care Breanna Shorkey: [name] Other Clinician: Referring Izaah Westman: [name] Treating Caroly Purewal/Extender: Melburn Hake, HOYT Weeks in Treatment: 4 Wound Status Wound Number: 1 Primary  Trauma, Other Etiology: Wound Location: Right Forearm Wound Open Wounding Event: Trauma Status: Date Acquired: 03/01/2018 Comorbid Sleep Apnea, Coronary Artery Disease, Weeks Of Treatment: 4 History: Hypertension, Type II Diabetes Clustered Wound: No Photos Photo Uploaded By: Montey Hora on 04/03/2018 09:37:02 Wound Measurements Length: (cm) 3.1 Width: (cm) 1.5 Depth: (cm) 0.1 Area: (cm) 3.652 Volume: (cm) 0.365 % Reduction in Area: 73.4% % Reduction in Volume: 73.4% Epithelialization: Small (1-33%) Tunneling: No Undermining: No Wound Description Full Thickness Without Exposed Support Classification: Structures Wound Margin: Flat and Intact Exudate Large Amount: Exudate Type: Serosanguineous Exudate Color: red, brown Foul Odor After Cleansing: No Slough/Fibrino Yes Wound Bed Granulation Amount: Large (67-100%) Exposed Structure Granulation Quality: Red, Pink Fascia Exposed: No Necrotic Amount: None Present (0%) Fat Layer (Subcutaneous Tissue) Exposed: Yes Tendon Exposed: No Muscle Exposed: No Joint Exposed: No Bone Exposed: No Haeberle, Bexton R. ([id number]) Periwound Skin Texture Texture Color No Abnormalities Noted: No No Abnormalities Noted: No Callus: No Atrophie Blanche: No Crepitus: No Cyanosis: No Excoriation: Yes Ecchymosis: No Induration: No Erythema: No  Rash: No Hemosiderin Staining: Yes Scarring: No Mottled: No Pallor: No Moisture Rubor: No No Abnormalities Noted: No Dry / Scaly: No Temperature / Pain Maceration: No Temperature: No Abnormality Wound Preparation Ulcer Cleansing: Rinsed/Irrigated with Saline Topical Anesthetic Applied: Other: lidocaine 4%, Electronic Signature(s) Signed: 04/03/2018 5:03:44 PM By: Montey Hora Entered By: Montey Hora on 04/03/2018 09:33:28 Hermann, Izael Alfonso Patten ([id number]) -------------------------------------------------------------------------------- Vitals Details Patient Name: Martinovich, Demarius  R. Date of Service: 04/03/2018 9:15 AM Medical Record Number: [id number] Patient Account Number: [id number] Date of Birth/Sex: Apr 19, 1932 (85 y.o. M) Treating RN: Harold Barban Primary Care Kyran Connaughton: [name] Other Clinician: Referring Laverta Harnisch: [name] Treating Betzalel Umbarger/Extender: Melburn Hake, HOYT Weeks in Treatment: 4 Vital Signs Time Taken: 09:18 Temperature (F): 97.6 Height (in): 69 Pulse (bpm): 55 Weight (lbs): 181 Respiratory Rate (breaths/min): 16 Body Mass Index (BMI): 26.7 Blood Pressure (mmHg): 138/74 Reference Range: 80 - 120 mg / dl Electronic Signature(s) Signed: 04/03/2018 11:23:59 AM By: Lorine Bears RCP, RRT, CHT Entered By: Lorine Bears on 04/03/2018 09:21:31

## 2018-05-12 NOTE — Progress Notes (Signed)
Roy [name] ([id number]) Visit Report for 04/03/2018 Chief Complaint Document Details Patient Name: [name], [name] Date of Service: 04/03/2018 9:15 AM Medical Record Number: [id number] Patient Account Number: [id number] Date of Birth/Sex: 1932-04-07 (85 y.o. M) Treating RN: Harold Barban Primary Care Provider: [name] Other Clinician: Referring Provider: [name] Treating Provider/Extender: Melburn Hake, HOYT Weeks in Treatment: 4 Information Obtained from: Patient Chief Complaint Donkey bite/Trauma right forearm Electronic Signature(s) Signed: 04/03/2018 9:52:35 PM By: Worthy Keeler PA-C Entered By: Worthy Keeler on 04/03/2018 09:43:18 [name], [name] ([id number]) -------------------------------------------------------------------------------- HPI Details Patient Name: [name], [name] Date of Service: 04/03/2018 9:15 AM Medical Record Number: [id number] Patient Account Number: [id number] Date of Birth/Sex: 05/20/1932 (85 y.o. M) Treating RN: Harold Barban Primary Care Provider: [name] Other Clinician: Referring Provider: [name] Treating Provider/Extender: Melburn Hake, HOYT Weeks in Treatment: 4 History of Present Illness HPI Description: 03/06/18 on evaluation today patient appears to be doing okay upon initial evaluation here in our clinic. He does have a skin tear which occurred on the right form which was a result of having been bitten by his daughter's donkey. He states that the animal is normally very mild tempered and in fact was not even mad when this occurred he had an [place] and was attempting to feed the donkey the donkey missed and biting for the [place] and got his arm through his coat. It did not pierce the coat but nonetheless just the pressure of the bite itself because the skin breakdown. Nonetheless he did have a significant skin tear which required that he go to the hospital at the emergency department on Saturday, March 01, 2018. Subsequently he was given clindamycin 300 mg four times a day. He states that his the does have diabetes and is A1c is around 5.1 at the last check. No fevers chills noted he does tell me that they had a difficult time getting his breathing under control at the emergency department when he was there. Fortunately this does not seem to be waiting separately this point although it is going to require some debridement bleeding may become an issue. Otherwise the patient has no other major medical problems and aspirin is the only blood thinner that he is on at this point. 03/13/18 on evaluation today patient's arm appears to be doing significantly better which is good news. He's been tolerating the dressing changes without complication. Fortunately there is no sign of infection. No fevers, chills, nausea, or vomiting noted at this time. 03/20/18 on evaluation today patient's arm ulcer actually appears to be doing excellent at this time. This has a great granulation surface and has done extremely well with the [facility] Dressing for the last week in my pinion. There is no sign of infection. No fevers, chills, nausea, or vomiting noted at this time. 03/27/18 on evaluation today patient actually appears to be doing very well in regard to his right forearm ulcer. This appears to show signs of great improvement and though it is healing slowly it has fielding quite nicely and hopefully will start and continue to show signs of epithelialization over the next few weeks. No fevers, chills, nausea, or vomiting noted at this time. 04/03/18 on evaluation today patient's arm actually appears to be doing excellent at this point. He has been tolerating the dressing changes without complication. Fortunately there is no signs of infection at this time. Overall very pleased with how things are progressing. We did have to show him which side of  his Hydrofera Blue Dressing goes down to the wound. He is now aware of  how to do this it was backwards when he came in today. Electronic Signature(s) Signed: 04/03/2018 9:52:35 PM By: Worthy Keeler PA-C Entered By: Worthy Keeler on 04/03/2018 10:06:16 [name], Roy [name] ([id number]) -------------------------------------------------------------------------------- Physical Exam Details Patient Name: [name], [name] Date of Service: 04/03/2018 9:15 AM Medical Record Number: [id number] Patient Account Number: [id number] Date of Birth/Sex: 05-30-1932 (85 y.o. M) Treating RN: Harold Barban Primary Care Provider: [name] Other Clinician: Referring Provider: [name] Treating Provider/Extender: STONE III, HOYT Weeks in Treatment: 4 Constitutional Well-nourished and well-hydrated in no acute distress. Respiratory normal breathing without difficulty. clear to auscultation bilaterally. Cardiovascular regular rate and rhythm with normal S1, S2. Psychiatric this patient is able to make decisions and demonstrates good insight into disease process. Alert and Oriented x 3. pleasant and cooperative. Notes Patient's wound bed currently shows signs of good granulation at this time overall he seems to be tolerating the dressing changes without complication which is excellent news. I'm very pleased with how things have progressed. Electronic Signature(s) Signed: 04/03/2018 9:52:35 PM By: Worthy Keeler PA-C Entered By: Worthy Keeler on 04/03/2018 10:06:49 Roy [name] ([id number]) -------------------------------------------------------------------------------- Physician Orders Details Patient Name: [name], [name] Date of Service: 04/03/2018 9:15 AM Medical Record Number: [id number] Patient Account Number: [id number] Date of Birth/Sex: May 18, 1932 (85 y.o. M) Treating RN: Harold Barban Primary Care Provider: [name] Other Clinician: Referring Provider: [name] Treating Provider/Extender: Melburn Hake, HOYT Weeks in Treatment: 4 Verbal /  Phone Orders: No Diagnosis Coding ICD-10 Coding Code Description W55.81XA Bitten by other mammals, initial encounter S51.801A Unspecified open wound of right forearm, initial encounter E11.622 Type 2 diabetes mellitus with other skin ulcer Wound Cleansing Wound #1 Right Forearm o Cleanse wound with mild soap and water - Clean wound with Dial antibacterial soap (pump), gently pat to dry. o May Shower, gently pat wound dry prior to applying new dressing. Primary Wound Dressing Wound #1 Right Forearm o Hydrafera Blue Ready Transfer Secondary Dressing Wound #1 Right Forearm o Dry Gauze o Conform/Kerlix o Coban Dressing Change Frequency Wound #1 Right Forearm o Change dressing every other day. Follow-up Appointments Wound #1 Right Forearm o Return Appointment in 2 weeks. Electronic Signature(s) Signed: 04/03/2018 9:52:35 PM By: Worthy Keeler PA-C Signed: 05/12/2018 10:53:34 AM By: Harold Barban Entered By: Harold Barban on 04/03/2018 09:59:58 Majette, Nicholes RMarland Kitchen ([id number]) -------------------------------------------------------------------------------- Problem List Details Patient Name: Markie, Granite R. Date of Service: 04/03/2018 9:15 AM Medical Record Number: [id number] Patient Account Number: [id number] Date of Birth/Sex: Aug 08, 1932 (85 y.o. M) Treating RN: Harold Barban Primary Care Provider: [name] Other Clinician: Referring Provider: [name] Treating Provider/Extender: Melburn Hake, HOYT Weeks in Treatment: 4 Active Problems ICD-10 Evaluated Encounter Code Description Active Date Today Diagnosis W55.81XA Bitten by other mammals, initial encounter 03/06/2018 No Yes S51.801A Unspecified open wound of right forearm, initial encounter 03/06/2018 No Yes E11.622 Type 2 diabetes mellitus with other skin ulcer 03/06/2018 No Yes Inactive Problems Resolved Problems Electronic Signature(s) Signed: 04/03/2018 9:52:35 PM By: Worthy Keeler PA-C Entered  By: Worthy Keeler on 04/03/2018 09:43:14 Iman, Dennis R. ([id number]) -------------------------------------------------------------------------------- Progress Note Details Patient Name: [name], [name] Date of Service: 04/03/2018 9:15 AM Medical Record Number: [id number] Patient Account Number: [id number] Date of Birth/Sex: [birth year] (85 y.o. M) Treating RN: Harold Barban Primary Care Provider: [name] Other Clinician: Referring Provider: [name] Treating Provider/Extender: Melburn Hake, HOYT Weeks in Treatment:  4 Subjective Chief Complaint Information obtained from Patient Pollie Friar bite/Trauma right forearm History of Present Illness (HPI) 03/06/18 on evaluation today patient appears to be doing okay upon initial evaluation here in our clinic. He does have a skin tear which occurred on the right form which was a result of having been bitten by his daughter's donkey. He states that the animal is normally very mild tempered and in fact was not even mad when this occurred he had an [place] and was attempting to feed the donkey the donkey missed and biting for the [place] and got his arm through his coat. It did not pierce the coat but nonetheless just the pressure of the bite itself because the skin breakdown. Nonetheless he did have a significant skin tear which required that he go to the hospital at the emergency department on Saturday, March 01, 2018. Subsequently he was given clindamycin 300 mg four times a day. He states that his the does have diabetes and is A1c is around 5.1 at the last check. No fevers chills noted he does tell me that they had a difficult time getting his breathing under control at the emergency department when he was there. Fortunately this does not seem to be waiting separately this point although it is going to require some debridement bleeding may become an issue. Otherwise the patient has no other major medical problems and aspirin is the only blood  thinner that he is on at this point. 03/13/18 on evaluation today patient's arm appears to be doing significantly better which is good news. He's been tolerating the dressing changes without complication. Fortunately there is no sign of infection. No fevers, chills, nausea, or vomiting noted at this time. 03/20/18 on evaluation today patient's arm ulcer actually appears to be doing excellent at this time. This has a great granulation surface and has done extremely well with the [facility] Dressing for the last week in my pinion. There is no sign of infection. No fevers, chills, nausea, or vomiting noted at this time. 03/27/18 on evaluation today patient actually appears to be doing very well in regard to his right forearm ulcer. This appears to show signs of great improvement and though it is healing slowly it has fielding quite nicely and hopefully will start and continue to show signs of epithelialization over the next few weeks. No fevers, chills, nausea, or vomiting noted at this time. 04/03/18 on evaluation today patient's arm actually appears to be doing excellent at this point. He has been tolerating the dressing changes without complication. Fortunately there is no signs of infection at this time. Overall very pleased with how things are progressing. We did have to show him which side of his [facility] Dressing goes down to the wound. He is now aware of how to do this it was backwards when he came in today. Patient History Information obtained from Patient. Family History Cancer - Siblings, Diabetes - Siblings, Heart Disease - Siblings, Kidney Disease - Siblings, No family history of Hereditary Spherocytosis, Hypertension, Lung Disease, Seizures, Stroke, Thyroid Problems, Tuberculosis. Social History Former smoker - ended on 03/05/1958, Marital Status - Married, Alcohol Use - Never, Drug Use - No History, Caffeine Use - Daily. [name], Roy FRASIER ([id number]) Medical  History Respiratory Patient has history of Sleep Apnea Cardiovascular Patient has history of Coronary Artery Disease, Hypertension Endocrine Patient has history of Type II Diabetes Medical And Surgical History Notes Hematologic/Lymphatic Thrombocytopenia Review of Systems (ROS) Constitutional Symptoms (General Health) Denies complaints or symptoms  of Fever, Chills. Respiratory The patient has no complaints or symptoms. Cardiovascular The patient has no complaints or symptoms. Psychiatric The patient has no complaints or symptoms. Objective Constitutional Well-nourished and well-hydrated in no acute distress. Vitals Time Taken: 9:18 AM, Height: 69 in, Weight: 181 lbs, BMI: 26.7, Temperature: 97.6 F, Pulse: 55 bpm, Respiratory Rate: 16 breaths/min, Blood Pressure: 138/74 mmHg. Respiratory normal breathing without difficulty. clear to auscultation bilaterally. Cardiovascular regular rate and rhythm with normal S1, S2. Psychiatric this patient is able to make decisions and demonstrates good insight into disease process. Alert and Oriented x 3. pleasant and cooperative. General Notes: Patient's wound bed currently shows signs of good granulation at this time overall he seems to be tolerating the dressing changes without complication which is excellent news. I'm very pleased with how things have progressed. Integumentary (Hair, Skin) Wound #1 status is Open. Original cause of wound was Trauma. The wound is located on the Right Forearm. The wound measures 3.1cm length x 1.5cm width x 0.1cm depth; 3.652cm^2 area and 0.365cm^3 volume. There is Fat Layer (Subcutaneous Tissue) Exposed exposed. There is no tunneling or undermining noted. There is a large amount of serosanguineous drainage noted. The wound margin is flat and intact. There is large (67-100%) red, pink granulation within Awe, Jeison R. ([id number]) the wound bed. There is no necrotic tissue within the wound bed. The  periwound skin appearance exhibited: Excoriation, Hemosiderin Staining. The periwound skin appearance did not exhibit: Callus, Crepitus, Induration, Rash, Scarring, Dry/Scaly, Maceration, Atrophie Blanche, Cyanosis, Ecchymosis, Mottled, Pallor, Rubor, Erythema. Periwound temperature was noted as No Abnormality. Assessment Active Problems ICD-10 Bitten by other mammals, initial encounter Unspecified open wound of right forearm, initial encounter Type 2 diabetes mellitus with other skin ulcer Plan Wound Cleansing: Wound #1 Right Forearm: Cleanse wound with mild soap and water - Clean wound with Dial antibacterial soap (pump), gently pat to dry. May Shower, gently pat wound dry prior to applying new dressing. Primary Wound Dressing: Wound #1 Right Forearm: Hydrafera Blue Ready Transfer Secondary Dressing: Wound #1 Right Forearm: Dry Gauze Conform/Kerlix Coban Dressing Change Frequency: Wound #1 Right Forearm: Change dressing every other day. Follow-up Appointments: Wound #1 Right Forearm: Return Appointment in 2 weeks. My suggestion is gonna be that we continue with the above wound care measures for the next week. The patient is in agreement with the plan. If anything changes or worsens meantime he will contact the office and let us know. Please see above for specific wound care orders. We will see patient for re-evaluation in 1 week(s) here in the clinic. If anything worsens or changes patient will contact our office for additional recommendations. Electronic Signature(s) Signed: 04/03/2018 9:52:35 PM By: Worthy Keeler PA-C Entered By: Worthy Keeler on 04/03/2018 10:06:59 [name], Roy MEINZER ([id number]) [name], Roy [name] ([id number]) -------------------------------------------------------------------------------- ROS/PFSH Details Patient Name: [name], [name] Date of Service: 04/03/2018 9:15 AM Medical Record Number: [id number] Patient Account Number: [id number] Date of Birth/Sex:  03-30-1932 (85 y.o. M) Treating RN: Harold Barban Primary Care Provider: [name] Other Clinician: Referring Provider: [name] Treating Provider/Extender: Melburn Hake, HOYT Weeks in Treatment: 4 Information Obtained From Patient Wound History Do you currently have one or more open woundso Yes How many open wounds do you currently haveo 1 Approximately how long have you had your woundso March 01, 2018 How have you been treating your wound(s) until nowo coban, soap and water Has your wound(s) ever healed and then re-openedo No Have you had any lab work done  in the past montho No Have you tested positive for an antibiotic resistant organism (MRSA, VRE)o No Have you tested positive for osteomyelitis (bone infection)o No Have you had any tests for circulation on your legso No Have you had other problems associated with your woundso Swelling Constitutional Symptoms (General Health) Complaints and Symptoms: Negative for: Fever; Chills Hematologic/Lymphatic Medical History: Past Medical History Notes: Thrombocytopenia Respiratory Complaints and Symptoms: No Complaints or Symptoms Medical History: Positive for: Sleep Apnea Cardiovascular Complaints and Symptoms: No Complaints or Symptoms Medical History: Positive for: Coronary Artery Disease; Hypertension Endocrine Medical History: Positive for: Type II Diabetes Time with diabetes: 8 years [name], [name] ([id number]) Psychiatric Complaints and Symptoms: No Complaints or Symptoms Immunizations Pneumococcal Vaccine: Received Pneumococcal Vaccination: Yes Implantable Devices Family and Social History Cancer: Yes - Siblings; Diabetes: Yes - Siblings; Heart Disease: Yes - Siblings; Hereditary Spherocytosis: No; Hypertension: No; Kidney Disease: Yes - Siblings; Lung Disease: No; Seizures: No; Stroke: No; Thyroid Problems: No; Tuberculosis: No; Former smoker - ended on 03/05/1958; Marital Status - Married; Alcohol  Use: Never; Drug Use: No History; Caffeine Use: Daily; Financial Concerns: No; Food, Clothing or Shelter Needs: No; Support System Lacking: No; Transportation Concerns: No; Advanced Directives: No; Patient does not want information on Advanced Directives; Living Will: No; Medical Power of Attorney: No Physician Affirmation I have reviewed and agree with the above information. Electronic Signature(s) Signed: 04/03/2018 9:52:35 PM By: Worthy Keeler PA-C Signed: 05/12/2018 10:53:34 AM By: Harold Barban Entered By: Worthy Keeler on 04/03/2018 10:06:31 [name], [name] ([id number]) -------------------------------------------------------------------------------- SuperBill Details Patient Name: [name], [name] Date of Service: 04/03/2018 Medical Record Number: [id number] Patient Account Number: [id number] Date of Birth/Sex: 1933-01-14 (85 y.o. M) Treating RN: Harold Barban Primary Care Provider: [name] Other Clinician: Referring Provider: [name] Treating Provider/Extender: Melburn Hake, HOYT Weeks in Treatment: 4 Diagnosis Coding ICD-10 Codes Code Description [phone] Bitten by other mammals, initial encounter S51.801A Unspecified open wound of right forearm, initial encounter E11.622 Type 2 diabetes mellitus with other skin ulcer Facility Procedures CPT4 Code: [id number] Description: 99213 - WOUND CARE VISIT-LEV 3 EST PT Modifier: Quantity: 1 Physician Procedures CPT4 Code: [id number] Description: 17711 - WC PHYS LEVEL 4 - EST PT ICD-10 Diagnosis Description W55.81XA Bitten by other mammals, initial encounter S51.801A Unspecified open wound of right forearm, initial encounte E11.622 Type 2 diabetes mellitus with other skin ulcer Modifier: r Quantity: 1 Electronic Signature(s) Signed: 04/03/2018 9:52:35 PM By: Worthy Keeler PA-C Entered By: Worthy Keeler on 04/03/2018 10:07:11

## 2018-05-16 ENCOUNTER — Ambulatory Visit: Payer: PPO | Admitting: Physician Assistant

## 2018-05-16 ENCOUNTER — Other Ambulatory Visit: Payer: Self-pay | Admitting: Cardiology

## 2018-05-16 DIAGNOSIS — I10 Essential (primary) hypertension: Secondary | ICD-10-CM

## 2018-05-22 ENCOUNTER — Ambulatory Visit: Payer: PPO | Admitting: Sports Medicine

## 2018-05-23 ENCOUNTER — Ambulatory Visit: Payer: PPO | Admitting: Physician Assistant

## 2018-06-06 ENCOUNTER — Encounter: Payer: Self-pay | Admitting: Family Medicine

## 2018-06-06 ENCOUNTER — Ambulatory Visit (INDEPENDENT_AMBULATORY_CARE_PROVIDER_SITE_OTHER): Payer: PPO | Admitting: Family Medicine

## 2018-06-06 VITALS — BP 136/66 | HR 62 | Temp 97.5°F | Ht 69.0 in | Wt 177.0 lb

## 2018-06-06 DIAGNOSIS — I10 Essential (primary) hypertension: Secondary | ICD-10-CM | POA: Diagnosis not present

## 2018-06-06 DIAGNOSIS — N183 Chronic kidney disease, stage 3 unspecified: Secondary | ICD-10-CM

## 2018-06-06 DIAGNOSIS — J189 Pneumonia, unspecified organism: Secondary | ICD-10-CM

## 2018-06-06 DIAGNOSIS — J4531 Mild persistent asthma with (acute) exacerbation: Secondary | ICD-10-CM

## 2018-06-06 DIAGNOSIS — E1122 Type 2 diabetes mellitus with diabetic chronic kidney disease: Secondary | ICD-10-CM | POA: Diagnosis not present

## 2018-06-06 DIAGNOSIS — J181 Lobar pneumonia, unspecified organism: Secondary | ICD-10-CM | POA: Diagnosis not present

## 2018-06-06 DIAGNOSIS — E1159 Type 2 diabetes mellitus with other circulatory complications: Secondary | ICD-10-CM

## 2018-06-06 MED ORDER — PREDNISONE 20 MG PO TABS: ORAL_TABLET | ORAL | 0 refills | Status: DC

## 2018-06-06 MED ORDER — AMOXICILLIN-POT CLAVULANATE 875-125 MG PO TABS: 1.0000 | ORAL_TABLET | Freq: Two times a day (BID) | ORAL | 0 refills | Status: AC

## 2018-06-06 MED ORDER — AZITHROMYCIN 250 MG PO TABS: ORAL_TABLET | ORAL | 0 refills | Status: DC

## 2018-06-06 NOTE — Patient Instructions (Addendum)
Phone visit.

## 2018-06-06 NOTE — Progress Notes (Signed)
Phone [phone]   Subjective:  Virtual visit via phonenote  Our team/I connected with Pincus Sanes Youngman on 06/06/18 at  9:40 AM EDT by phone (patient did not have equipment for webex) and verified that I am speaking with the correct person using two identifiers.  Location patient: [place] Location provider: [facility], office Persons participating in the virtual visit:  Patient  Time on call: 26 minutes Spent time counseling about potential covid 19 but likely low likelihood, importance of remaining at home (he is already doing), reviewing plan for treatment and also reasons to return to care.   Our team/I discussed the limitations of evaluation and management by telemedicine and the availability of in person appointments. In light of current covid-19 pandemic, patient also understands that we are trying to protect them by minimizing in office contact if at all possible.  The patient expressed consent for telemedicine visit and agreed to proceed.   ROS- No chest pain. No headache or blurry vision.  No fever.  Having some cough still- feeling tight left lung.   Past Medical History-  Patient Active Problem List   Diagnosis Date Noted   Type II diabetes mellitus with renal manifestations ([place]) 09/11/2006    Priority: High   Coronary artery disease with angina pectoris ([place]) 09/11/2006    Priority: High   Thrombocytopenia ([place]) 02/23/2015    Priority: Medium   Mild persistent asthma 10/30/2010    Priority: Medium   CKD (chronic kidney disease), stage III ([place]) 02/22/2009    Priority: Medium   Hyperlipidemia associated with type 2 diabetes mellitus ([place]) 09/11/2006    Priority: Medium   Hypertension associated with diabetes ([place]) 09/11/2006    Priority: Medium   Back pain 09/11/2006    Priority: Medium   Sleep apnea 09/11/2006    Priority: Medium   GERD (gastroesophageal reflux disease) 05/03/2014    Priority: Low   Insomnia 05/03/2014    Priority: Low   Allergic  rhinitis 04/21/2014    Priority: Low   Giardia 04/21/2014    Priority: Low   NEPHROLITHIASIS, RECURRENT 01/21/2009    Priority: Low   ACTINIC KERATOSIS, HEAD 06/14/2008    Priority: Low   Pain of right thumb 04/08/2018   Food impaction of esophagus 04/03/2018   Senile purpura ([place]) 08/08/2017    Medications- reviewed and updated Current Outpatient Medications  Medication Sig Dispense Refill   acetaminophen (TYLENOL) 325 MG tablet Take 650 mg by mouth every evening. Reported on 06/28/2015     albuterol (PROVENTIL HFA;VENTOLIN HFA) 108 (90 Base) MCG/ACT inhaler Inhale 2 puffs into the lungs every 4 (four) hours as needed for wheezing or shortness of breath. 1 Inhaler 10   amLODipine (NORVASC) 2.5 MG tablet Take 1 tablet (2.5 mg total) by mouth daily. 90 tablet 3   aspirin EC 81 MG tablet Take 1 tablet (81 mg total) by mouth daily.     ASSURE COMFORT LANCETS 30G MISC 1 each by Other route daily.     atorvastatin (LIPITOR) 10 MG tablet Take 1 tablet by mouth once daily 90 tablet 0   budesonide-formoterol (SYMBICORT) 160-4.5 MCG/ACT inhaler Inhale 2 puffs into the lungs 2 (two) times daily. 10.2 g 10   calcium-vitamin D (OSCAL WITH D) 500-200 MG-UNIT per tablet Take 1 tablet by mouth daily.     glimepiride (AMARYL) 4 MG tablet Take 1 tablet (4 mg total) by mouth daily with breakfast. 90 tablet 3   glucose blood (ACCU-CHEK AVIVA PLUS) test strip Test blood  glucose daily. 100 each 12   glucose blood test strip One Touch Ultra Test Strips 100 each 12   guaiFENesin (MUCINEX) 600 MG 12 hr tablet Take by mouth 2 (two) times daily.     isosorbide mononitrate (IMDUR) 30 MG 24 hr tablet TAKE 3 TABLETS BY MOUTH ONCE DAILY 270 tablet 3   lisinopril (PRINIVIL,ZESTRIL) 5 MG tablet TAKE 1/2 (ONE-HALF) TABLET BY MOUTH ONCE DAILY 45 tablet 1   metFORMIN (GLUCOPHAGE) 500 MG tablet TAKE 1/2 (ONE-HALF) TABLET BY MOUTH ONCE DAILY WITH BREAKFAST 45 tablet 1   nitroGLYCERIN (NITROSTAT) 0.4  MG SL tablet Place 1 tablet (0.4 mg total) under the tongue every 5 (five) minutes as needed for chest pain. 25 tablet 1   Omega-3 Fatty Acids (FISH OIL PO) Take 2 capsules by mouth 2 (two) times daily.     omeprazole (PRILOSEC) 20 MG capsule Take 1 capsule (20 mg total) by mouth 2 (two) times daily. 60 capsule 1   OVER THE COUNTER MEDICATION Take 1 tablet by mouth at bedtime. Legatrim PM     Polyethyl Glycol-Propyl Glycol (SYSTANE OP) Place 1 drop into both eyes 4 (four) times daily. Itchy/dry eye     potassium citrate (UROCIT-K) 10 MEQ (1080 MG) SR tablet Take 10 mEq by mouth Twice daily.     ranolazine (RANEXA) 500 MG 12 hr tablet Take 1 tablet (500 mg total) by mouth 2 (two) times daily. Please keep upcoming appt for future refills. 180 tablet 0   traMADol (ULTRAM) 50 MG tablet Take 1 tablet (50 mg total) by mouth every 6 (six) hours as needed for moderate pain (twice per day maximum). 60 tablet 2   traZODone (DESYREL) 150 MG tablet TAKE 1 TABLET BY MOUTH ONCE DAILY 90 tablet 0   zinc sulfate 220 MG capsule Take 220 mg by mouth daily.     amoxicillin-clavulanate (AUGMENTIN) 875-125 MG tablet Take 1 tablet by mouth 2 (two) times daily for 7 days. 14 tablet 0   azithromycin (ZITHROMAX) 250 MG tablet Take 2 tabs on day 1, then 1 tab daily until finished 6 tablet 0   predniSONE (DELTASONE) 20 MG tablet Take 2 pills for 3 days, 1 pill for 4 days 10 tablet 0   No current facility-administered medications for this visit.      Objective:  BP 136/66    Pulse 62    Temp (!) 97.5 F (36.4 C) (Oral)    Ht 5\' 9"  (1.753 m)    Wt 177 lb (80.3 kg)    BMI 26.14 kg/m  No obvious distress Patient does not sound winded while talking Intermittent cough Normal speech    Assessment and Plan   #  Pneumonia/CKD III/asthma S:Patient was seen by Inda Coke, PA on 05/06/2018- he was febrile at the time. Flu swab was negative. There was right upper lung pneumonia noted. Concern for COPD  exacerbation and patient was placed on doxycycline and prednisone. Had follow up 3 days later and symptoms improved on antibiotic and prednisone.   Coughing never seemed to stop but last week or more he has had more coughing, more fatigue and felt somewhat short of breath. Mild wheezing. Yesterday tried to walk to barn and back- used albuterol and that seemed to really open him up. He is still using symbicort regularly. He is coughing up more sputum than usual and it is turning a darker color. No blood in sputum.   Denies any sick contacts and he has been staying at home  for several weeks. Went to get cow feed but he stayed in the car.  A/P:  Original plan for today was repeat x-ray to ensure clearance.  -With wheezing, shortness of breat, continued cough- sounds like recurrent asthmaexacerbation or possibly inadequately cleared pneumonia - I reviewed notes from Dr. [place] Bells in regards to COPD from 10/25/14 "He has mild persistent asthma with airflow obstruction. I do not believe he has COPD as his smoking history was minimal." Patient had been told had COPD in the past apparently- he believes he has this.  - continue symbicort - in case he does have mild COPD- going to cover for that as well-   Reviewed uptodate which states doxycycline may have minimal benefit in copd exacerbations- they suggest if patient has risk factors for poor outcomes to use augmentin or levaquin as long as not high risk for pseudomonas- we opted to trial augmentin first but would consider levaquin.  - also adding azithromcyin as this would be more aggressive pneumonia coverage - Please note GFR in 30s so would likely need to adjust levaquin but od not have to adjust augmentin -does have cephalosporin allergy and if he has a rash will let me know - we discussed this could be covid 19 but with him being homebound over last few weeks I thought this was less likely- regardless if he has worsening shortness of breath or notes any  confusion should let us know immediately.  -advised albuterol every 6 hours for next 24 hours  # Diabetes S: reasonably controlled on last check. On metformin 250mg  once a day and glimepiride 4 mg.  CBGs- did go up close to 300 on prednisone.  Exercise and diet- limited due to current respiratory issues Lab Results  Component Value Date   HGBA1C 6.8 (H) 04/17/2018   HGBA1C 5.7 (A) 12/09/2017   HGBA1C 6.5 08/08/2017   A/P: suspect stable- likely to spike on prednisone again- hopeful a1c stays down. Could increase glimepiride  #hypertension S: controlled on imdur 90 mg, lisinopril 2.5 mg, amlodipine 2.5 mg. Appears is now off metorpolol BP Readings from Last 3 Encounters:  06/06/18 136/66  05/09/18 139/70  05/06/18 140/70  A/P: stable- continue current meds. Initial BP high- but has looked really good in recent days- he will continue to monitor this with goal <140/90  Future Appointments  Date Time Provider [place]  06/16/2018 10:40 AM Gerda Diss, DO [facility]  08/06/2018  9:20 AM Sueanne Margarita, MD [facility]  08/19/2018  9:40 AM Marin Olp, MD [facility]   Meds ordered this encounter  Medications   amoxicillin-clavulanate (AUGMENTIN) 875-125 MG tablet    Sig: Take 1 tablet by mouth 2 (two) times daily for 7 days.    Dispense:  14 tablet    Refill:  0   predniSONE (DELTASONE) 20 MG tablet    Sig: Take 2 pills for 3 days, 1 pill for 4 days    Dispense:  10 tablet    Refill:  0   azithromycin (ZITHROMAX) 250 MG tablet    Sig: Take 2 tabs on day 1, then 1 tab daily until finished    Dispense:  6 tablet    Refill:  0    Using for pneumonia along with augmentin. Also covers COPD exacerbation. Not using for covid 19- patient has been homebound for > 2 weeks    Return precautions advised.  [name], MD

## 2018-06-11 ENCOUNTER — Telehealth: Payer: Self-pay | Admitting: Family Medicine

## 2018-06-11 NOTE — Telephone Encounter (Signed)
Copied from [place] [id number]. Topic: Quick Sport and exercise psychologist Patient (Clinic Use ONLY) >> Jun 11, 2018  2:24 PM Robina Ade, Helene Kelp D wrote: Reason for CRM: Office called patient and he is returning the call.

## 2018-06-16 ENCOUNTER — Ambulatory Visit: Payer: PPO | Admitting: Sports Medicine

## 2018-06-16 ENCOUNTER — Other Ambulatory Visit: Payer: Self-pay | Admitting: Family Medicine

## 2018-06-16 NOTE — Telephone Encounter (Signed)
Pt last OV was 06/06/2018  Pt last refill was 12/09/2017 60 tab 2 rfs 1 tab every 6hrs  Okay for refill?  Please advise

## 2018-06-16 NOTE — Telephone Encounter (Signed)
Not sure why pt was called. Pt notified of update

## 2018-06-16 NOTE — Telephone Encounter (Signed)
Patient states wheezing and shortness of breath have resolved.  Cough and congestion also much improved.  We discussed getting follow-up chest x-ray in 6 weeks hopefully.  I will set a reminder to check in at that time

## 2018-07-05 ENCOUNTER — Other Ambulatory Visit: Payer: Self-pay | Admitting: Family Medicine

## 2018-07-13 ENCOUNTER — Other Ambulatory Visit: Payer: Self-pay | Admitting: Family Medicine

## 2018-07-14 NOTE — Telephone Encounter (Signed)
Last OV 06/06/18 Last refill 01/17/18 $45/1 Next OV 08/19/18

## 2018-07-15 DIAGNOSIS — H40013 Open angle with borderline findings, low risk, bilateral: Secondary | ICD-10-CM | POA: Diagnosis not present

## 2018-07-20 ENCOUNTER — Other Ambulatory Visit: Payer: Self-pay | Admitting: Family Medicine

## 2018-07-28 ENCOUNTER — Other Ambulatory Visit: Payer: Self-pay | Admitting: Family Medicine

## 2018-07-28 ENCOUNTER — Other Ambulatory Visit: Payer: Self-pay | Admitting: Pulmonary Disease

## 2018-08-01 ENCOUNTER — Telehealth: Payer: Self-pay

## 2018-08-01 DIAGNOSIS — J189 Pneumonia, unspecified organism: Secondary | ICD-10-CM

## 2018-08-01 NOTE — Telephone Encounter (Signed)
-----   Message from Marin Olp, MD sent at 07/29/2018  8:52 PM EDT ----- Regarding: X-ray follow-up Convert to phone note  Patient needs a follow-up chest x-ray under pneumonia-to ensure clearance of prior pneumonia findings as long as he is COVID-19 symptom-free-this needs to be scheduled when Teressa Senter is backThanks, Roy [name]

## 2018-08-05 ENCOUNTER — Telehealth: Payer: Self-pay | Admitting: *Deleted

## 2018-08-05 DIAGNOSIS — G4733 Obstructive sleep apnea (adult) (pediatric): Secondary | ICD-10-CM

## 2018-08-05 NOTE — Telephone Encounter (Signed)
    Order  faxed to [facility] 05/21/17.   Prefers [place] office    May 17, 2017  Sueanne Margarita, MD  to Me     9:32 AM     Please order a Airsense CPAP at 7cm H2O with heated humidity and supplies

## 2018-08-05 NOTE — Telephone Encounter (Signed)
-----   Message from Sueanne Margarita, MD sent at 08/03/2018  8:05 PM EDT ----- Regarding: RE: NO CPAP I ordered him a new PAP 05/2017 ----- Message ----- From: Freada Bergeron, CMA Sent: 08/03/2018   2:35 PM EDT To: Sueanne Margarita, MD Subject: NO CPAP                                        Pt never had his sleep study. I scheduled it 10/15/17 and patient called and cancelled it the same day and never rescheduled.

## 2018-08-06 ENCOUNTER — Encounter: Payer: Self-pay | Admitting: Cardiology

## 2018-08-06 ENCOUNTER — Telehealth: Payer: Self-pay | Admitting: Cardiology

## 2018-08-06 ENCOUNTER — Ambulatory Visit: Payer: PPO | Admitting: Cardiology

## 2018-08-06 ENCOUNTER — Other Ambulatory Visit: Payer: Self-pay

## 2018-08-06 ENCOUNTER — Telehealth (INDEPENDENT_AMBULATORY_CARE_PROVIDER_SITE_OTHER): Payer: PPO | Admitting: Cardiology

## 2018-08-06 VITALS — Ht 69.0 in | Wt 178.0 lb

## 2018-08-06 DIAGNOSIS — E1159 Type 2 diabetes mellitus with other circulatory complications: Secondary | ICD-10-CM | POA: Diagnosis not present

## 2018-08-06 DIAGNOSIS — I10 Essential (primary) hypertension: Secondary | ICD-10-CM

## 2018-08-06 DIAGNOSIS — E1169 Type 2 diabetes mellitus with other specified complication: Secondary | ICD-10-CM

## 2018-08-06 DIAGNOSIS — N183 Chronic kidney disease, stage 3 unspecified: Secondary | ICD-10-CM

## 2018-08-06 DIAGNOSIS — E785 Hyperlipidemia, unspecified: Secondary | ICD-10-CM

## 2018-08-06 DIAGNOSIS — E1121 Type 2 diabetes mellitus with diabetic nephropathy: Secondary | ICD-10-CM

## 2018-08-06 DIAGNOSIS — I25119 Atherosclerotic heart disease of native coronary artery with unspecified angina pectoris: Secondary | ICD-10-CM

## 2018-08-06 DIAGNOSIS — G4733 Obstructive sleep apnea (adult) (pediatric): Secondary | ICD-10-CM

## 2018-08-06 MED ORDER — RANOLAZINE ER 500 MG PO TB12: 500.0000 mg | ORAL_TABLET | Freq: Two times a day (BID) | ORAL | 3 refills | Status: DC

## 2018-08-06 NOTE — Telephone Encounter (Signed)
Wife of patient called returning a call from Gae Bon about her husband's CPAP Machine. They were getting ready to leave, and ask that the office call back in the morning.

## 2018-08-06 NOTE — Progress Notes (Signed)
Virtual Visit via Telephone Note   This visit type was conducted due to national recommendations for restrictions regarding the COVID-19 Pandemic (e.g. social distancing) in an effort to limit this patient's exposure and mitigate transmission in our community.  Due to his co-morbid illnesses, this patient is at least at moderate risk for complications without adequate follow up.  This format is felt to be most appropriate for this patient at this time.  The patient did not have access to video technology/had technical difficulties with video requiring transitioning to audio format only (telephone).  All issues noted in this document were discussed and addressed.  No physical exam could be performed with this format.  Please refer to the patient's chart for his  consent to telehealth for [facility]  Evaluation Performed:  Follow-up visit  This visit type was conducted due to national recommendations for restrictions regarding the COVID-19 Pandemic (e.g. social distancing).  This format is felt to be most appropriate for this patient at this time.  All issues noted in this document were discussed and addressed.  No physical exam was performed (except for noted visual exam findings with Video Visits).  Please refer to the patient's chart (MyChart message for video visits and phone note for telephone visits) for the patient's consent to telehealth for [facility].  Date:  08/06/2018   ID:  Roy [name], DOB 1932-10-09, MRN [id number]  Patient Location:  Home  Provider location:   [place]  PCP:  Marin Olp, MD  Cardiologist: Fransico Him, MD Electrophysiologist:  None   Chief Complaint:  CAD, HTN, OSA  History of Present Illness:    Roy [name] is a 85 y.o. male who presents via audio/video conferencing for a telehealth visit today.    Roy [name] is a 85 y.o. male with a hx of CAD s/p PTCA D1 in 1999 with repeat cath 12/2011 with nonobstructive dz (CP felt secondary to  small vessel dz), CKD, diabetes, HTN,OSA on CPAP and hyperlipidemia.    He is here today for followup and is doing well.  He denies any chest pain or pressure, SOB, DOE, PND, orthopnea, LE edema, dizziness, palpitations or syncope. He is compliant with his meds and is tolerating meds with no SE.  He has OSA but is intolerant to CPAP and did not want to use it.    The patient does not have symptoms concerning for COVID-19 infection (fever, chills, cough, or new shortness of breath).    Prior CV studies:   The following studies were reviewed today:  none  Past Medical History:  Diagnosis Date  . Anginal pain ([place])   . Arthritis    spine  . Asthma   . CAD (coronary artery disease)    s/p PCI of the D1 with cath in 2013 showing nonobstructive disease.  CP presumed due to small vessel disease.  [name] CAP (community acquired pneumonia) 12/26/2011  . Chronic kidney disease    renal insufficiency  . Diabetes mellitus, type 2 ([place])   . Fever 12/26/2011  . GERD (gastroesophageal reflux disease)   . Heart murmur   . Hyperlipidemia   . Hypertension   . Lyme disease    states had shakes that were thought to be parkinson's related but related to tick bite, states also RMSF  . Myocardial infarction ([place])   . Shortness of breath   . Sleep apnea    uses cpap   Past Surgical History:  Procedure Laterality Date  .  APPENDECTOMY    . CERVICAL LAMINECTOMY    . CHOLECYSTECTOMY    . ESOPHAGOGASTRODUODENOSCOPY N/A 04/04/2018   Procedure: ESOPHAGOGASTRODUODENOSCOPY (EGD);  Surgeon: Lin Landsman, MD;  Location: [facility] ENDOSCOPY;  Service: Gastroenterology;  Laterality: N/A;  . KIDNEY STONE SURGERY    . LEFT HEART CATH N/A 02/15/2012   Procedure: LEFT HEART CATH;  Surgeon: Larey Dresser, MD;  Location: [facility] CATH LAB;  Service: Cardiovascular;  Laterality: N/A;     Current Meds  Medication Sig  . acetaminophen (TYLENOL) 325 MG tablet Take 650 mg by mouth every evening. Reported on 06/28/2015  .  albuterol (PROVENTIL HFA;VENTOLIN HFA) 108 (90 Base) MCG/ACT inhaler Inhale 2 puffs into the lungs every 4 (four) hours as needed for wheezing or shortness of breath.  [name] amLODipine (NORVASC) 2.5 MG tablet Take 1 tablet (2.5 mg total) by mouth daily.  [name] aspirin EC 81 MG tablet Take 1 tablet (81 mg total) by mouth daily.  . ASSURE COMFORT LANCETS 30G MISC 1 each by Other route daily.  [name] atorvastatin (LIPITOR) 10 MG tablet Take 1 tablet by mouth once daily  . calcium-vitamin D (OSCAL WITH D) 500-200 MG-UNIT per tablet Take 1 tablet by mouth daily.  [name] glimepiride (AMARYL) 4 MG tablet Take 1 tablet (4 mg total) by mouth daily with breakfast.  . glucose blood (ACCU-CHEK AVIVA PLUS) test strip Test blood glucose daily.  [name] glucose blood test strip One Touch Ultra Test Strips  . guaiFENesin (MUCINEX) 600 MG 12 hr tablet Take by mouth 2 (two) times daily.  . isosorbide mononitrate (IMDUR) 30 MG 24 hr tablet TAKE 3 TABLETS BY MOUTH ONCE DAILY  . lisinopril (ZESTRIL) 5 MG tablet Take 1/2 (one-half) tablet by mouth once daily  . metFORMIN (GLUCOPHAGE) 500 MG tablet TAKE 1/2 (ONE-HALF) TABLET BY MOUTH ONCE DAILY WITH BREAKFAST  . metoprolol succinate (TOPROL-XL) 50 MG 24 hr tablet Take 1 tablet by mouth daily.  . nitroGLYCERIN (NITROSTAT) 0.4 MG SL tablet Place 1 tablet (0.4 mg total) under the tongue every 5 (five) minutes as needed for chest pain.  . Omega-3 Fatty Acids (FISH OIL PO) Take 2 capsules by mouth 2 (two) times daily.  [name] omeprazole (PRILOSEC) 20 MG capsule Take 1 capsule by mouth twice daily  . OVER THE COUNTER MEDICATION Take 1 tablet by mouth at bedtime. Legatrim PM  . Polyethyl Glycol-Propyl Glycol (SYSTANE OP) Place 1 drop into both eyes 4 (four) times daily. Itchy/dry eye  . potassium citrate (UROCIT-K) 10 MEQ (1080 MG) SR tablet Take 10 mEq by mouth Twice daily.  . ranolazine (RANEXA) 500 MG 12 hr tablet Take 1 tablet (500 mg total) by mouth 2 (two) times daily. Please keep upcoming appt for  future refills.  . SYMBICORT 160-4.5 MCG/ACT inhaler Inhale 2 puffs by mouth twice daily  . traMADol (ULTRAM) 50 MG tablet TAKE 1 TABLET BY MOUTH EVERY 6 HOURS AS NEEDED FOR MODERATE PAIN. (TWICE PER DAY MAXIMUM)  . traZODone (DESYREL) 150 MG tablet Take 1 tablet by mouth once daily  . zinc sulfate 220 MG capsule Take 220 mg by mouth daily.     Allergies:   Codeine sulfate; Crestor [rosuvastatin calcium]; and Cephalexin   Social History   Tobacco Use  . Smoking status: Former Smoker    Packs/day: 1.50    Years: 8.00    Pack years: 12.00    Types: Cigarettes    Last attempt to quit: 03/05/1958    Years since quitting: 60.4  .  Smokeless tobacco: Never Used  Substance Use Topics  . Alcohol use: No    Alcohol/week: 0.0 standard drinks  . Drug use: No     Family Hx: The patient's family history includes ALS in his father; Asthma in his daughter; Cancer in his sister.  ROS:   Please see the history of present illness.     All other systems reviewed and are negative.   Labs/Other Tests and Data Reviewed:    Recent Labs: 04/17/2018: ALT 17; BUN 30; Creatinine, Ser 1.99; Hemoglobin 12.0; Platelets 191.0; Potassium 4.8; Sodium 138   Recent Lipid Panel Lab Results  Component Value Date/Time   CHOL 119 05/09/2017 08:26 AM   TRIG 144 05/09/2017 08:26 AM   TRIG 205 (HH) 12/17/2005 10:45 AM   HDL 35 (L) 05/09/2017 08:26 AM   CHOLHDL 3.4 05/09/2017 08:26 AM   CHOLHDL 4 04/17/2016 10:02 AM   LDLCALC 55 05/09/2017 08:26 AM   LDLDIRECT 42.0 04/17/2018 11:15 AM    Wt Readings from Last 3 Encounters:  08/06/18 178 lb (80.7 kg)  06/06/18 177 lb (80.3 kg)  05/09/18 184 lb 8 oz (83.7 kg)     Objective:    Vital Signs:  Ht 5\' 9"  (1.753 m)   Wt 178 lb (80.7 kg)   BMI 26.29 kg/m     ASSESSMENT & PLAN:    1.  ASCAD - s/p PTCA D1 in 1999 with repeat cath 12/2011 with nonobstructive dz - CP felt secondary to small vessel dz.  He has not had any anginal sx since I saw him last.  He  will continue on ASA 81mg  daily, Ranexa 500mg  BID, Toprol XL 50mg  daily, statin and Imdur 30mg  daily.    2.  HTN - his BP is controlled at home.  He will continue on Lisinopril 5mg  daily, amlodipine 2.5mg  daily and Toprol XL 50mg  daily.  His creatinine was stable at 1.99 in February.  3.  CKD stage 3 - this is followed by his PCP and his baseline creatinine is around 1.9.  4.  Hyperlipidemia - his LDL goal is < 70.  His last LDL was 42.  He will continue on  Atorvastatin 10mg  daily.   5.  OSA -he stopped his PAP.  He says he sleeps better without it.   6.  Type 2 DM - this is followed by his PCP.  He will continue on metformin 250mg  daily, glimepiride 4mg  daily.  COVID-19 Education: The signs and symptoms of COVID-19 were discussed with the patient and how to seek care for testing (follow up with PCP or arrange E-visit).  The importance of social distancing was discussed today.  Patient Risk:   After full review of this patient's clinical status, I feel that they are at least moderate risk at this time.  Time:   Today, I have spent 10 minutes directly with the patient on telephone discussing medical problems including CAD, lipids, HTN, OSA.  We also reviewed the symptoms of COVID 19 and the ways to protect against contracting the virus with telehealth technology.  I spent an additional 5 minutes reviewing patient's chart including PAP compliance download and labs.  Medication Adjustments/Labs and Tests Ordered: Current medicines are reviewed at length with the patient today.  Concerns regarding medicines are outlined above.  Tests Ordered: No orders of the defined types were placed in this encounter.  Medication Changes: No orders of the defined types were placed in this encounter.   Disposition:  Follow up in 1  year(s)  Signed, Fransico Him, MD  08/06/2018 9:40 AM    [facility]

## 2018-08-06 NOTE — Patient Instructions (Signed)

## 2018-08-07 ENCOUNTER — Other Ambulatory Visit: Payer: Self-pay

## 2018-08-07 ENCOUNTER — Other Ambulatory Visit: Payer: PPO

## 2018-08-07 ENCOUNTER — Ambulatory Visit (INDEPENDENT_AMBULATORY_CARE_PROVIDER_SITE_OTHER): Payer: PPO

## 2018-08-07 ENCOUNTER — Other Ambulatory Visit: Payer: Self-pay | Admitting: Family Medicine

## 2018-08-07 DIAGNOSIS — J181 Lobar pneumonia, unspecified organism: Secondary | ICD-10-CM | POA: Diagnosis not present

## 2018-08-07 DIAGNOSIS — J189 Pneumonia, unspecified organism: Secondary | ICD-10-CM | POA: Diagnosis not present

## 2018-08-12 NOTE — Telephone Encounter (Signed)
New cpap has been ordered for patient. Please order a Airsense CPAP at 7cm H2O with heated humidity and supplies     Sent to [employer] office

## 2018-08-19 ENCOUNTER — Ambulatory Visit (INDEPENDENT_AMBULATORY_CARE_PROVIDER_SITE_OTHER): Payer: PPO | Admitting: Family Medicine

## 2018-08-19 ENCOUNTER — Encounter: Payer: Self-pay | Admitting: Family Medicine

## 2018-08-19 VITALS — BP 129/68

## 2018-08-19 DIAGNOSIS — E1169 Type 2 diabetes mellitus with other specified complication: Secondary | ICD-10-CM | POA: Diagnosis not present

## 2018-08-19 DIAGNOSIS — I25119 Atherosclerotic heart disease of native coronary artery with unspecified angina pectoris: Secondary | ICD-10-CM | POA: Diagnosis not present

## 2018-08-19 DIAGNOSIS — E1159 Type 2 diabetes mellitus with other circulatory complications: Secondary | ICD-10-CM | POA: Diagnosis not present

## 2018-08-19 DIAGNOSIS — D696 Thrombocytopenia, unspecified: Secondary | ICD-10-CM | POA: Diagnosis not present

## 2018-08-19 DIAGNOSIS — N183 Chronic kidney disease, stage 3 unspecified: Secondary | ICD-10-CM

## 2018-08-19 DIAGNOSIS — M25511 Pain in right shoulder: Secondary | ICD-10-CM | POA: Diagnosis not present

## 2018-08-19 DIAGNOSIS — E785 Hyperlipidemia, unspecified: Secondary | ICD-10-CM

## 2018-08-19 DIAGNOSIS — Z79899 Other long term (current) drug therapy: Secondary | ICD-10-CM

## 2018-08-19 DIAGNOSIS — G8929 Other chronic pain: Secondary | ICD-10-CM | POA: Diagnosis not present

## 2018-08-19 DIAGNOSIS — I10 Essential (primary) hypertension: Secondary | ICD-10-CM | POA: Diagnosis not present

## 2018-08-19 DIAGNOSIS — J4540 Moderate persistent asthma, uncomplicated: Secondary | ICD-10-CM | POA: Diagnosis not present

## 2018-08-19 DIAGNOSIS — E1121 Type 2 diabetes mellitus with diabetic nephropathy: Secondary | ICD-10-CM

## 2018-08-19 DIAGNOSIS — I152 Hypertension secondary to endocrine disorders: Secondary | ICD-10-CM

## 2018-08-19 NOTE — Progress Notes (Signed)
Phone [phone]   Subjective:  Virtual visit via Video note. Chief complaint: Chief Complaint  Patient presents with  . Follow-up  . Diabetes  . Hypertension  . Asthma  . Chronic Kidney Disease    This visit type was conducted due to national recommendations for restrictions regarding the COVID-19 Pandemic (e.g. social distancing).  This format is felt to be most appropriate for this patient at this time balancing risks to patient and risks to population by having him in for in person visit.  No physical exam was performed (except for noted visual exam or audio findings with Telehealth visits).    Our team/I connected with Roy [name] at  9:40 AM EDT by a video enabled telemedicine application (doxy.me or caregility through epic) and verified that I am speaking with the correct person using two identifiers.  Location patient: Home[facility] Location provider: [facility], office Persons participating in the virtual visit:  patient  Our team/I discussed the limitations of evaluation and management by telemedicine and the availability of in person appointments. In light of current covid-19 pandemic, patient also understands that we are trying to protect them by minimizing in office contact if at all possible.  The patient expressed consent for telemedicine visit and agreed to proceed. Patient understands insurance will be billed.   ROS- No fever, chills, cough, shortness of breath, body aches, sore throat, or loss of taste or smell. No known covid 19 contacts.   Past Medical History-  Patient Active Problem List   Diagnosis Date Noted  . Type II diabetes mellitus with renal manifestations ([place]) 09/11/2006    Priority: High  . Coronary artery disease with angina pectoris ([place]) 09/11/2006    Priority: High  . Thrombocytopenia ([place]) 02/23/2015    Priority: Medium  . Mild persistent asthma 10/30/2010    Priority: Medium  . CKD (chronic kidney disease), stage III ([place]) 02/22/2009   Priority: Medium  . Hyperlipidemia associated with type 2 diabetes mellitus ([place]) 09/11/2006    Priority: Medium  . Hypertension associated with diabetes ([place]) 09/11/2006    Priority: Medium  . Back pain 09/11/2006    Priority: Medium  . Sleep apnea 09/11/2006    Priority: Medium  . GERD (gastroesophageal reflux disease) 05/03/2014    Priority: Low  . Insomnia 05/03/2014    Priority: Low  . Allergic rhinitis 04/21/2014    Priority: Low  . Giardia 04/21/2014    Priority: Low  . NEPHROLITHIASIS, RECURRENT 01/21/2009    Priority: Low  . ACTINIC KERATOSIS, HEAD 06/14/2008    Priority: Low  . Pain of right thumb 04/08/2018  . Food impaction of esophagus 04/03/2018  . Senile purpura ([place]) 08/08/2017    Medications- reviewed and updated Current Outpatient Medications  Medication Sig Dispense Refill  . acetaminophen (TYLENOL) 325 MG tablet Take 650 mg by mouth every evening. Reported on 06/28/2015    . albuterol (PROVENTIL HFA;VENTOLIN HFA) 108 (90 Base) MCG/ACT inhaler Inhale 2 puffs into the lungs every 4 (four) hours as needed for wheezing or shortness of breath. 1 Inhaler 10  . amLODipine (NORVASC) 2.5 MG tablet Take 1 tablet (2.5 mg total) by mouth daily. 90 tablet 3  . aspirin EC 81 MG tablet Take 1 tablet (81 mg total) by mouth daily.    . ASSURE COMFORT LANCETS 30G MISC 1 each by Other route daily.    [name] atorvastatin (LIPITOR) 10 MG tablet Take 1 tablet by mouth once daily 90 tablet 0  . calcium-vitamin D (OSCAL WITH  D) 500-200 MG-UNIT per tablet Take 1 tablet by mouth daily.    [name] glimepiride (AMARYL) 4 MG tablet Take 1 tablet (4 mg total) by mouth daily with breakfast. 90 tablet 3  . glucose blood (ACCU-CHEK AVIVA PLUS) test strip Test blood glucose daily. 100 each 12  . glucose blood test strip One Touch Ultra Test Strips 100 each 12  . guaiFENesin (MUCINEX) 600 MG 12 hr tablet Take by mouth 2 (two) times daily.    . isosorbide mononitrate (IMDUR) 30 MG 24 hr tablet TAKE 3  TABLETS BY MOUTH ONCE DAILY 270 tablet 3  . lisinopril (ZESTRIL) 5 MG tablet Take 1/2 (one-half) tablet by mouth once daily 45 tablet 0  . metFORMIN (GLUCOPHAGE) 500 MG tablet TAKE 1/2 (ONE-HALF) TABLET BY MOUTH ONCE DAILY WITH BREAKFAST 45 tablet 1  . metoprolol succinate (TOPROL-XL) 50 MG 24 hr tablet Take 1 tablet by mouth daily.    . nitroGLYCERIN (NITROSTAT) 0.4 MG SL tablet Place 1 tablet (0.4 mg total) under the tongue every 5 (five) minutes as needed for chest pain. 25 tablet 1  . Omega-3 Fatty Acids (FISH OIL PO) Take 2 capsules by mouth 2 (two) times daily.    [name] omeprazole (PRILOSEC) 20 MG capsule Take 1 capsule by mouth twice daily 60 capsule 1  . OVER THE COUNTER MEDICATION Take 1 tablet by mouth at bedtime. Legatrim PM    . Polyethyl Glycol-Propyl Glycol (SYSTANE OP) Place 1 drop into both eyes 4 (four) times daily. Itchy/dry eye    . potassium citrate (UROCIT-K) 10 MEQ (1080 MG) SR tablet Take 10 mEq by mouth Twice daily.    . ranolazine (RANEXA) 500 MG 12 hr tablet Take 1 tablet (500 mg total) by mouth 2 (two) times daily. Please keep upcoming appt for future refills. 180 tablet 3  . SYMBICORT 160-4.5 MCG/ACT inhaler Inhale 2 puffs by mouth twice daily 11 g 0  . traMADol (ULTRAM) 50 MG tablet TAKE 1 TABLET BY MOUTH EVERY 6 HOURS AS NEEDED FOR MODERATE PAIN. (TWICE PER DAY MAXIMUM) 60 tablet 2  . traZODone (DESYREL) 150 MG tablet Take 1 tablet by mouth once daily 90 tablet 0  . zinc sulfate 220 MG capsule Take 220 mg by mouth daily.     No current facility-administered medications for this visit.      Objective:  BP 129/68  self reported vitals Gen: NAD, resting comfortably Lungs: nonlabored, normal respiratory rate  Skin: appears dry, no obvious rash    Assessment and Plan   # right shoulder pain  S:patient states has had right shoulder pain for over a year but for the last 3-4 weeks it has been pretty bad.  Tramadol helps to get to sleep but wakes up in pain when it wears  off. Up to 10/10 pain. Had an injection 10 years ago or more and states injection really helped.  A/P: severe right shoulder pain-will refer to Dr. Tamala Julian of sports medicine and hopeful can get patient in within a week or so.   # Asthma S:Using Symbicort 160-4.5 daily and Albuterol HFA prn.- rarely using albuterol A/P: Stable. Continue current medications.    # Diabetes S: hopefully remains well controlled on Glimepiride 4 mg and Metformin 500 mg 1/2 tablet daily.  CBGs- has to get a new battery for his meter Exercise and diet- trying to stay active in his yard walking.  Lab Results  Component Value Date   HGBA1C 6.8 (H) 04/17/2018   HGBA1C 5.7 (A)  12/09/2017   HGBA1C 6.5 08/08/2017  A/P: hopefully stable- will try to get a1c updated  #hypertension S: controlled on Amlodipine 2.5 mg and imdur 90 mgand Lisinopril 5 mg 1/2 tablet daily. Daughter has his home cuff but last time he checked recently looked good as below BP Readings from Last 3 Encounters:  08/19/18 129/68  06/06/18 136/66  05/09/18 139/70  A/P: Stable. Continue current medications.   #hyperlipidemia S: well controlled on Atorvastatin 10 mg daily.  Lab Results  Component Value Date   CHOL 119 05/09/2017   HDL 35 (L) 05/09/2017   LDLCALC 55 05/09/2017   LDLDIRECT 42.0 04/17/2018   TRIG 144 05/09/2017   CHOLHDL 3.4 05/09/2017   A/P: hopefully controlled- update lipid panel  # GERD S:Taking Omeprazole 20 mg BID. Was getting choked with eating before he started this dose A/P: Stable. Continue current medications.   - will check b12 as also on metformin.  - if remains well controlled through next visit- may go down to 20mg  once a day  # Cad with Angina S:Taking Imdur 90 mg daily and Ranexa 500 mg BID.  Chest pain free on this. Compliant with aspirin and statin A/P: Stable. Continue current medications.    6 month physical and will call back for labs Lab/Order associations:   ICD-10-CM   1. Chronic right  shoulder pain  M25.511 Ambulatory referral to Sports Medicine   G89.29   2. Moderate persistent asthma without complication  N62.95   3. Type 2 diabetes mellitus with diabetic nephropathy, without long-term current use of insulin (HCC)  E11.21 CBC    Comprehensive metabolic panel    Lipid panel    Hemoglobin A1c  4. Coronary artery disease involving native coronary artery of native heart with angina pectoris ([place])  I25.119   5. Thrombocytopenia ([place])  D69.6   6. CKD (chronic kidney disease), stage III (HCC)  N18.3   7. Hyperlipidemia associated with type 2 diabetes mellitus (HCC)  E11.69    E78.5   8. Hypertension associated with diabetes ([place])  E11.59    I10   9. High risk medication use  Z79.899 Vitamin B12   Return precautions advised.  [name], MD

## 2018-08-19 NOTE — Patient Instructions (Addendum)
Health Maintenance Due  Topic Date Due  . FOOT EXAM Will do different visit.  08/09/2018

## 2018-08-25 ENCOUNTER — Other Ambulatory Visit: Payer: Self-pay | Admitting: Pulmonary Disease

## 2018-09-02 ENCOUNTER — Other Ambulatory Visit (INDEPENDENT_AMBULATORY_CARE_PROVIDER_SITE_OTHER): Payer: PPO

## 2018-09-02 ENCOUNTER — Other Ambulatory Visit: Payer: Self-pay

## 2018-09-02 DIAGNOSIS — Z79899 Other long term (current) drug therapy: Secondary | ICD-10-CM

## 2018-09-02 DIAGNOSIS — E1121 Type 2 diabetes mellitus with diabetic nephropathy: Secondary | ICD-10-CM | POA: Diagnosis not present

## 2018-09-02 LAB — COMPREHENSIVE METABOLIC PANEL
ALT: 9 U/L (ref 0–53)
AST: 10 U/L (ref 0–37)
Albumin: 4.3 g/dL (ref 3.5–5.2)
Alkaline Phosphatase: 79 U/L (ref 39–117)
BUN: 30 mg/dL — ABNORMAL HIGH (ref 6–23)
CO2: 25 mEq/L (ref 19–32)
Calcium: 8.8 mg/dL (ref 8.4–10.5)
Chloride: 105 mEq/L (ref 96–112)
Creatinine, Ser: 1.96 mg/dL — ABNORMAL HIGH (ref 0.40–1.50)
GFR: 32.76 mL/min — ABNORMAL LOW (ref >60.00)
Glucose, Bld: 114 mg/dL — ABNORMAL HIGH (ref 70–99)
Potassium: 4.2 mEq/L (ref 3.5–5.1)
Sodium: 139 mEq/L (ref 135–145)
Total Bilirubin: 0.7 mg/dL (ref 0.2–1.2)
Total Protein: 6.2 g/dL (ref 6.0–8.3)

## 2018-09-02 LAB — CBC
HCT: 35.1 % — ABNORMAL LOW (ref 39.0–52.0)
Hemoglobin: 11.5 g/dL — ABNORMAL LOW (ref 13.0–17.0)
MCHC: 32.9 g/dL (ref 30.0–36.0)
MCV: 86.6 fl (ref 78.0–100.0)
Platelets: 125 10*3/uL — ABNORMAL LOW (ref 150.0–400.0)
RBC: 4.05 Mil/uL — ABNORMAL LOW (ref 4.22–5.81)
RDW: 15.1 % (ref 11.5–15.5)
WBC: 6.6 10*3/uL (ref 4.0–10.5)

## 2018-09-02 LAB — LIPID PANEL
Cholesterol: 124 mg/dL (ref 0–200)
HDL: 43.7 mg/dL (ref >39.00)
LDL Cholesterol: 60 mg/dL (ref 0–99)
NonHDL: 80.47
Total CHOL/HDL Ratio: 3
Triglycerides: 100 mg/dL (ref 0.0–149.0)
VLDL: 20 mg/dL (ref 0.0–40.0)

## 2018-09-02 LAB — VITAMIN B12: Vitamin B-12: 399 pg/mL (ref 211–911)

## 2018-09-02 LAB — HEMOGLOBIN A1C: Hgb A1c MFr Bld: 7.2 % — ABNORMAL HIGH (ref 4.6–6.5)

## 2018-09-03 ENCOUNTER — Telehealth: Payer: Self-pay | Admitting: Family Medicine

## 2018-09-03 ENCOUNTER — Encounter: Payer: Self-pay | Admitting: Family Medicine

## 2018-09-03 ENCOUNTER — Ambulatory Visit: Payer: Self-pay

## 2018-09-03 ENCOUNTER — Ambulatory Visit (INDEPENDENT_AMBULATORY_CARE_PROVIDER_SITE_OTHER)
Admission: RE | Admit: 2018-09-03 | Discharge: 2018-09-03 | Disposition: A | Discharge status: Home / Self Care | Payer: PPO | Source: Ambulatory Visit | Attending: Family Medicine | Admitting: Family Medicine

## 2018-09-03 ENCOUNTER — Ambulatory Visit: Payer: PPO | Admitting: Family Medicine

## 2018-09-03 VITALS — BP 110/70 | HR 61 | Ht 69.0 in | Wt 184.0 lb

## 2018-09-03 DIAGNOSIS — M19019 Primary osteoarthritis, unspecified shoulder: Secondary | ICD-10-CM | POA: Insufficient documentation

## 2018-09-03 DIAGNOSIS — M25511 Pain in right shoulder: Secondary | ICD-10-CM

## 2018-09-03 DIAGNOSIS — M7531 Calcific tendinitis of right shoulder: Secondary | ICD-10-CM | POA: Diagnosis not present

## 2018-09-03 DIAGNOSIS — M19011 Primary osteoarthritis, right shoulder: Secondary | ICD-10-CM

## 2018-09-03 DIAGNOSIS — G8929 Other chronic pain: Secondary | ICD-10-CM

## 2018-09-03 DIAGNOSIS — M47812 Spondylosis without myelopathy or radiculopathy, cervical region: Secondary | ICD-10-CM | POA: Diagnosis not present

## 2018-09-03 NOTE — Progress Notes (Signed)
[facility] [address], [place] [address] Phone: [phone] Subjective:   I Kandace Blitz am serving as a Education administrator for Dr. Hulan Saas.   I'm seeing this patient by the request  of:  Marin Olp, MD   CC: Right shoulder pain  IOE:[id number]  Roy [name] is a 85 y.o. male coming in with complaint of right shoulder pain. Dr. Benay Pillow injection the shoulder around 2009. Pain radiates to his thumb.   Onset- Chronic (since last year but it wasn't bad) Location -anterior and lateral Character- achy  Aggravating factors- night time when laying down, combing his hair  Reliving factors-  Therapies tried- topical, oral medications at night and mornings Severity-8 out of 10.     Past Medical History:  Diagnosis Date  . Anginal pain ([place])   . Arthritis    spine  . Asthma   . CAD (coronary artery disease)    s/p PCI of the D1 with cath in 2013 showing nonobstructive disease.  CP presumed due to small vessel disease.  [name] CAP (community acquired pneumonia) 12/26/2011  . Chronic kidney disease    renal insufficiency  . Diabetes mellitus, type 2 ([place])   . Fever 12/26/2011  . GERD (gastroesophageal reflux disease)   . Heart murmur   . Hyperlipidemia   . Hypertension   . Lyme disease    states had shakes that were thought to be parkinson's related but related to tick bite, states also RMSF  . Myocardial infarction ([place])   . Shortness of breath   . Sleep apnea    intolerant to PAP   Past Surgical History:  Procedure Laterality Date  . APPENDECTOMY    . CERVICAL LAMINECTOMY    . CHOLECYSTECTOMY    . ESOPHAGOGASTRODUODENOSCOPY N/A 04/04/2018   Procedure: ESOPHAGOGASTRODUODENOSCOPY (EGD);  Surgeon: Lin Landsman, MD;  Location[facility] ENDOSCOPY;  Service: Gastroenterology;  Laterality: N/A;  . KIDNEY STONE SURGERY    . LEFT HEART CATH N/A 02/15/2012   Procedure: LEFT HEART CATH;  Surgeon: Larey Dresser, MD;  Location: [facility] CATH  LAB;  Service: Cardiovascular;  Laterality: N/A;   Social History   Socioeconomic History  . Marital status: Married    Spouse name: Not on file  . Number of children: Not on file  . Years of education: Not on file  . Highest education level: Not on file  Occupational History  . Not on file  Social Needs  . Financial resource strain: Not on file  . Food insecurity    Worry: Not on file    Inability: Not on file  . Transportation needs    Medical: Not on file    Non-medical: Not on file  Tobacco Use  . Smoking status: Former Smoker    Packs/day: 1.50    Years: 8.00    Pack years: 12.00    Types: Cigarettes    Quit date: 03/05/1958    Years since quitting: 60.5  . Smokeless tobacco: Never Used  Substance and Sexual Activity  . Alcohol use: No    Alcohol/week: 0.0 standard drinks  . Drug use: No  . Sexual activity: Not Currently  Lifestyle  . Physical activity    Days per week: Not on file    Minutes per session: Not on file  . Stress: Not on file  Relationships  . Social Herbalist on phone: Not on file    Gets together: Not on  file    Attends religious service: Not on file    Active member of club or organization: Not on file    Attends meetings of clubs or organizations: Not on file    Relationship status: Not on file  Other Topics Concern  . Not on file  Social History Narrative   Married (wife patient of Dr. Yong Channel). 3 children. 4 grandchildren. 3 greatgrandchildren.       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 Codeine Sulfate Other (See Comments)    Chest pain  . Crestor [Rosuvastatin Calcium]   . Cephalexin Rash   Family History  Problem Relation Age of Onset  . ALS Father   . Asthma Daughter   . Cancer Sister        breast/liver    Current Outpatient Medications (Endocrine & Metabolic):  .  glimepiride (AMARYL) 4 MG tablet, Take 1 tablet (4 mg  total) by mouth daily with breakfast. .  metFORMIN (GLUCOPHAGE) 500 MG tablet, TAKE 1/2 (ONE-HALF) TABLET BY MOUTH ONCE DAILY WITH BREAKFAST  Current Outpatient Medications (Cardiovascular):  .  amLODipine (NORVASC) 2.5 MG tablet, Take 1 tablet (2.5 mg total) by mouth daily. [name]  atorvastatin (LIPITOR) 10 MG tablet, Take 1 tablet by mouth once daily .  isosorbide mononitrate (IMDUR) 30 MG 24 hr tablet, TAKE 3 TABLETS BY MOUTH ONCE DAILY .  lisinopril (ZESTRIL) 5 MG tablet, Take 1/2 (one-half) tablet by mouth once daily .  metoprolol succinate (TOPROL-XL) 50 MG 24 hr tablet, Take 1 tablet by mouth daily. .  nitroGLYCERIN (NITROSTAT) 0.4 MG SL tablet, Place 1 tablet (0.4 mg total) under the tongue every 5 (five) minutes as needed for chest pain. .  ranolazine (RANEXA) 500 MG 12 hr tablet, Take 1 tablet (500 mg total) by mouth 2 (two) times daily. Please keep upcoming appt for future refills.  Current Outpatient Medications (Respiratory):  .  albuterol (PROVENTIL HFA;VENTOLIN HFA) 108 (90 Base) MCG/ACT inhaler, Inhale 2 puffs into the lungs every 4 (four) hours as needed for wheezing or shortness of breath. .  guaiFENesin (MUCINEX) 600 MG 12 hr tablet, Take by mouth 2 (two) times daily. .  SYMBICORT 160-4.5 MCG/ACT inhaler, Inhale 2 puffs by mouth twice daily  Current Outpatient Medications (Analgesics):  .  acetaminophen (TYLENOL) 325 MG tablet, Take 650 mg by mouth every evening. Reported on 06/28/2015 .  aspirin EC 81 MG tablet, Take 1 tablet (81 mg total) by mouth daily. .  traMADol (ULTRAM) 50 MG tablet, TAKE 1 TABLET BY MOUTH EVERY 6 HOURS AS NEEDED FOR MODERATE PAIN. (TWICE PER DAY MAXIMUM)   Current Outpatient Medications (Other):  [name]  ASSURE COMFORT LANCETS 30G MISC, 1 each by Other route daily. .  calcium-vitamin D (OSCAL WITH D) 500-200 MG-UNIT per tablet, Take 1 tablet by mouth daily. [name]  glucose blood (ACCU-CHEK AVIVA PLUS) test strip, Test blood glucose daily. [name]  glucose blood test  strip, One Touch Ultra Test Strips .  Omega-3 Fatty Acids (FISH OIL PO), Take 2 capsules by mouth 2 (two) times daily. [name]  omeprazole (PRILOSEC) 20 MG capsule, Take 1 capsule by mouth twice daily .  OVER THE COUNTER MEDICATION, Take 1 tablet by mouth at bedtime. Legatrim PM .  Polyethyl Glycol-Propyl Glycol (SYSTANE OP), Place 1 drop into both eyes 4 (four) times daily. Itchy/dry eye .  potassium citrate (UROCIT-K) 10 MEQ (1080 MG) SR  tablet, Take 10 mEq by mouth Twice daily. .  traZODone (DESYREL) 150 MG tablet, Take 1 tablet by mouth once daily .  zinc sulfate 220 MG capsule, Take 220 mg by mouth daily.    Past medical history, social, surgical and family history all reviewed in electronic medical record.  No pertanent information unless stated regarding to the chief complaint.   Review of Systems:  No headache, visual changes, nausea, vomiting, diarrhea, constipation, dizziness, abdominal pain, skin rash, fevers, chills, night sweats, weight loss, swollen lymph nodes, body aches, joint swelling, , chest pain, shortness of breath, mood changes.  Positive muscle aches  Objective  Blood pressure 110/70, pulse 61, height 5\' 9"  (1.753 m), weight 184 lb (83.5 kg), SpO2 98 %.    General: No apparent distress alert and oriented x3 mood and affect normal, dressed appropriately.  HEENT: Pupils equal, extraocular movements intact  Respiratory: Patient's speak in full sentences and does not appear short of breath  Cardiovascular: No lower extremity edema, non tender, no erythema  Skin: Warm dry intact with no signs of infection or rash on extremities or on axial skeleton.  Abdomen: Soft nontender  Neuro: Cranial nerves II through XII are intact, neurovascularly intact in all extremities with 2+ DTRs and 2+ pulses.  Lymph: No lymphadenopathy of posterior or anterior cervical chain or axillae bilaterally.  Gait normal with good balance and coordination.  MSK:  Non tender with full range of and good  stability and symmetric strength and tone of shoulders, elbows, wrist, hip, knee and ankles bilaterally.  Shoulder: Right Inspection reveals no abnormalities, atrophy or asymmetry. Palpation is normal with no tenderness over AC joint or bicipital groove. ROM limited in all planes of increased 5 degrees especially with external rotation with only 10 degrees. Rotator cuff strength 3+ out of 5 strength compared to contralateral side Severe signs of impingement noted with Neer's and Hawkins Speeds and Yergason's tests normal. Positive crossover and O'Briens Normal scapular function observed. Positive painful arc No apprehension sign  Contralateral shoulder unremarkable  MSK US performed of: Right shoulder This study was ordered, performed, and interpreted by Charlann Boxer D.O.  Shoulder:   Supraspinatus: Severe calcific changes with pain superficial calcific bursitis also noted Subscapularis: Significant calcific changes noted. AC joint: Moderate arthritis with also calcific changes Glenohumeral Joint:  Appears normal without effusion. Glenoid Labrum:  Intact without visualized tears. Biceps Tendon: Significant hypoechoic changes but no true tearing appreciated Impression: Calcific tendinitis and bursitis of the right shoulder  Procedure: Real-time Ultrasound Guided Injection of right glenohumeral joint Device: GE Logiq Q7  Ultrasound guided injection is preferred based studies that show increased duration, increased effect, greater accuracy, decreased procedural pain, increased response rate with ultrasound guided versus blind injection.  Verbal informed consent obtained.  Time-out conducted.  Noted no overlying erythema, induration, or other signs of local infection.  Skin prepped in a sterile fashion.  Local anesthesia: Topical Ethyl chloride.  With sterile technique and under real time ultrasound guidance:  Joint visualized.  23g 1  inch needle inserted posterior approach. Pictures  taken for needle placement. Patient did have injection of 2 cc of 1% lidocaine, 2 cc of 0.5% Marcaine, and 1.0 cc of Kenalog 40 mg/dL. Completed without difficulty  Pain immediately resolved suggesting accurate placement of the medication.  Advised to call if fevers/chills, erythema, induration, drainage, or persistent bleeding.  Images permanently stored and available for review in the ultrasound unit.  Impression: Technically successful ultrasound guided injection.  Procedure: Real-time  Ultrasound Guided Injection of right acromioclavicular joint Device: GE Logiq Q7 Ultrasound guided injection is preferred based studies that show increased duration, increased effect, greater accuracy, decreased procedural pain, increased response rate, and decreased cost with ultrasound guided versus blind injection.  Verbal informed consent obtained.  Time-out conducted.  Noted no overlying erythema, induration, or other signs of local infection.  Skin prepped in a sterile fashion.  Local anesthesia: Topical Ethyl chloride.  With sterile technique and under real time ultrasound guidance: With a 25-gauge half inch needle injected with 0.5 cc of 0.5% Marcaine and 0.5 cc of Kenalog 40 mg/mL Completed without difficulty  Pain immediately resolved suggesting accurate placement of the medication.  Advised to call if fevers/chills, erythema, induration, drainage, or persistent bleeding.  Images permanently stored and available for review in the ultrasound unit.  Impression: Technically successful ultrasound guided injection.  [id number]; 15 additional minutes spent for Therapeutic exercises as stated in above notes.  This included exercises focusing on stretching, strengthening, with significant focus on eccentric aspects.   Long term goals include an improvement in range of motion, strength, endurance as well as avoiding reinjury. Patient's frequency would include in 1-2 times a day, 3-5 times a week for a duration of  6-12 weeks. Shoulder Exercises that included:  Basic scapular stabilization to include adduction and depression of scapula Scaption, focusing on proper movement and good control Internal and External rotation utilizing a theraband, with elbow tucked at side entire time Rows with theraband    Proper technique shown and discussed handout in great detail with ATC.  All questions were discussed and answered.     Impression and Recommendations:     This case required medical decision making of moderate complexity. The above documentation has been reviewed and is accurate and complete Lyndal Pulley, DO       Note: This dictation was prepared with Dragon dictation along with smaller phrase technology. Any transcriptional errors that result from this process are unintentional.

## 2018-09-03 NOTE — Assessment & Plan Note (Signed)
Injected as well.  Discussed icing regimen and home exercise.  Discussed which activities to do which was to avoid.  I do believe that the calcific changes is what is more the concerning aspect.  Patient has been in great hands with primary care provider.  Patient will follow-up with me again in 4 weeks.

## 2018-09-03 NOTE — Assessment & Plan Note (Signed)
Significant calcific tendinitis noted.  Difficult to assess if there is any true tearing secondary to the amount of calcium.  Discussed with patient in great length about home exercises and patient work with trainer.  Discussed icing regimen, home exercise, which activities of doing which wants to avoid.  Follow-up again in 4 to 6 weeks

## 2018-09-03 NOTE — Patient Instructions (Signed)
Good to see you.  Ice 20 minutes 2 times daily. Usually after activity and before bed. Exercises 3 times a week.  pennsaid pinkie amount topically 2 times daily as needed.   Turmeric 500mg  daily  Tart cherry extract 1200mg  at night Vitamin D 2000 IU daily  See me again in 6 weeks if not doing much better

## 2018-09-03 NOTE — Telephone Encounter (Signed)
Error- was trying to click review button

## 2018-09-05 ENCOUNTER — Other Ambulatory Visit: Payer: Self-pay | Admitting: Family Medicine

## 2018-09-14 ENCOUNTER — Other Ambulatory Visit: Payer: Self-pay | Admitting: Family Medicine

## 2018-10-02 ENCOUNTER — Other Ambulatory Visit: Payer: Self-pay

## 2018-10-15 ENCOUNTER — Other Ambulatory Visit: Payer: Self-pay | Admitting: Family Medicine

## 2018-10-16 DIAGNOSIS — N200 Calculus of kidney: Secondary | ICD-10-CM | POA: Diagnosis not present

## 2018-10-16 DIAGNOSIS — N400 Enlarged prostate without lower urinary tract symptoms: Secondary | ICD-10-CM | POA: Diagnosis not present

## 2018-10-17 NOTE — Progress Notes (Deleted)
[facility] [address] [place], [place] [address] Phone: [phone] Subjective:    I'm seeing this patient by the request  of:    CC: Neck and shoulder pain f/u   [id number]  Roy [name] is a 85 y.o. male coming in with complaint of ***  Onset-  Location Duration-  Character- Aggravating factors- Reliving factors-  Therapies tried-  Severity-    given injection 7/2  Past Medical History:  Diagnosis Date  . Anginal pain ([place])   . Arthritis    spine  . Asthma   . CAD (coronary artery disease)    s/p PCI of the D1 with cath in 2013 showing nonobstructive disease.  CP presumed due to small vessel disease.  [name] CAP (community acquired pneumonia) 12/26/2011  . Chronic kidney disease    renal insufficiency  . Diabetes mellitus, type 2 ([place])   . Fever 12/26/2011  . GERD (gastroesophageal reflux disease)   . Heart murmur   . Hyperlipidemia   . Hypertension   . Lyme disease    states had shakes that were thought to be parkinson's related but related to tick bite, states also RMSF  . Myocardial infarction ([place])   . Shortness of breath   . Sleep apnea    intolerant to PAP   Past Surgical History:  Procedure Laterality Date  . APPENDECTOMY    . CERVICAL LAMINECTOMY    . CHOLECYSTECTOMY    . ESOPHAGOGASTRODUODENOSCOPY N/A 04/04/2018   Procedure: ESOPHAGOGASTRODUODENOSCOPY (EGD);  Surgeon: Lin Landsman, MD;  Location: [facility] ENDOSCOPY;  Service: Gastroenterology;  Laterality: N/A;  . KIDNEY STONE SURGERY    . LEFT HEART CATH N/A 02/15/2012   Procedure: LEFT HEART CATH;  Surgeon: Larey Dresser, MD;  Location[facility] CATH LAB;  Service: Cardiovascular;  Laterality: N/A;   Social History   Socioeconomic History  . Marital status: Married    Spouse name: Not on file  . Number of children: Not on file  . Years of education: Not on file  . Highest education level: Not on file  Occupational History  . Not on file  Social Needs  .  Financial resource strain: Not on file  . Food insecurity    Worry: Not on file    Inability: Not on file  . Transportation needs    Medical: Not on file    Non-medical: Not on file  Tobacco Use  . Smoking status: Former Smoker    Packs/day: 1.50    Years: 8.00    Pack years: 12.00    Types: Cigarettes    Quit date: 03/05/1958    Years since quitting: 60.6  . Smokeless tobacco: Never Used  Substance and Sexual Activity  . Alcohol use: No    Alcohol/week: 0.0 standard drinks  . Drug use: No  . Sexual activity: Not Currently  Lifestyle  . Physical activity    Days per week: Not on file    Minutes per session: Not on file  . Stress: Not on file  Relationships  . Social Herbalist on phone: Not on file    Gets together: Not on file    Attends religious service: Not on file    Active member of club or organization: Not on file    Attends meetings of clubs or organizations: Not on file    Relationship status: Not on file  Other Topics Concern  . Not on file  Social History Narrative  Married (wife patient of Dr. Yong Channel). 3 children. 4 grandchildren. 3 greatgrandchildren.       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 Codeine Sulfate Other (See Comments)    Chest pain  . Crestor [Rosuvastatin Calcium]   . Cephalexin Rash   Family History  Problem Relation Age of Onset  . ALS Father   . Asthma Daughter   . Cancer Sister        breast/liver    Current Outpatient Medications (Endocrine & Metabolic):  .  glimepiride (AMARYL) 4 MG tablet, Take 1 tablet (4 mg total) by mouth daily with breakfast. .  metFORMIN (GLUCOPHAGE) 500 MG tablet, TAKE 1/2 (ONE-HALF) TABLET BY MOUTH ONCE DAILY WITH BREAKFAST  Current Outpatient Medications (Cardiovascular):  .  amLODipine (NORVASC) 2.5 MG tablet, Take 1 tablet (2.5 mg total) by mouth daily. [name]  atorvastatin (LIPITOR) 10 MG tablet, Take  1 tablet by mouth once daily .  isosorbide mononitrate (IMDUR) 30 MG 24 hr tablet, TAKE 3 TABLETS BY MOUTH ONCE DAILY .  lisinopril (ZESTRIL) 5 MG tablet, Take 1/2 (one-half) tablet by mouth once daily .  metoprolol succinate (TOPROL-XL) 50 MG 24 hr tablet, Take 1 tablet by mouth daily. .  nitroGLYCERIN (NITROSTAT) 0.4 MG SL tablet, Place 1 tablet (0.4 mg total) under the tongue every 5 (five) minutes as needed for chest pain. .  ranolazine (RANEXA) 500 MG 12 hr tablet, Take 1 tablet (500 mg total) by mouth 2 (two) times daily. Please keep upcoming appt for future refills.  Current Outpatient Medications (Respiratory):  .  albuterol (PROVENTIL HFA;VENTOLIN HFA) 108 (90 Base) MCG/ACT inhaler, Inhale 2 puffs into the lungs every 4 (four) hours as needed for wheezing or shortness of breath. .  guaiFENesin (MUCINEX) 600 MG 12 hr tablet, Take by mouth 2 (two) times daily. .  SYMBICORT 160-4.5 MCG/ACT inhaler, Inhale 2 puffs by mouth twice daily  Current Outpatient Medications (Analgesics):  .  acetaminophen (TYLENOL) 325 MG tablet, Take 650 mg by mouth every evening. Reported on 06/28/2015 .  aspirin EC 81 MG tablet, Take 1 tablet (81 mg total) by mouth daily. .  traMADol (ULTRAM) 50 MG tablet, TAKE 1 TABLET BY MOUTH EVERY 6 HOURS AS NEEDED FOR MODERATE PAIN. (TWICE PER DAY MAXIMUM)   Current Outpatient Medications (Other):  [name]  ASSURE COMFORT LANCETS 30G MISC, 1 each by Other route daily. .  calcium-vitamin D (OSCAL WITH D) 500-200 MG-UNIT per tablet, Take 1 tablet by mouth daily. [name]  glucose blood (ACCU-CHEK AVIVA PLUS) test strip, Test blood glucose daily. [name]  glucose blood test strip, One Touch Ultra Test Strips .  Omega-3 Fatty Acids (FISH OIL PO), Take 2 capsules by mouth 2 (two) times daily. [name]  omeprazole (PRILOSEC) 20 MG capsule, Take 1 capsule by mouth twice daily .  OVER THE COUNTER MEDICATION, Take 1 tablet by mouth at bedtime. Legatrim PM .  Polyethyl Glycol-Propyl Glycol (SYSTANE OP),  Place 1 drop into both eyes 4 (four) times daily. Itchy/dry eye .  potassium citrate (UROCIT-K) 10 MEQ (1080 MG) SR tablet, Take 10 mEq by mouth Twice daily. .  traZODone (DESYREL) 150 MG tablet, Take 1 tablet by mouth once daily .  zinc sulfate 220 MG capsule, Take 220 mg by mouth daily.    Past medical history, social, surgical and family history all reviewed in electronic medical record.  No pertanent information unless  stated regarding to the chief complaint.   Review of Systems:  No headache, visual changes, nausea, vomiting, diarrhea, constipation, dizziness, abdominal pain, skin rash, fevers, chills, night sweats, weight loss, swollen lymph nodes, body aches, joint swelling, muscle aches, chest pain, shortness of breath, mood changes.   Objective  There were no vitals taken for this visit. Systems examined below as of    General: No apparent distress alert and oriented x3 mood and affect normal, dressed appropriately.  HEENT: Pupils equal, extraocular movements intact  Respiratory: Patient's speak in full sentences and does not appear short of breath  Cardiovascular: No lower extremity edema, non tender, no erythema  Skin: Warm dry intact with no signs of infection or rash on extremities or on axial skeleton.  Abdomen: Soft nontender  Neuro: Cranial nerves II through XII are intact, neurovascularly intact in all extremities with 2+ DTRs and 2+ pulses.  Lymph: No lymphadenopathy of posterior or anterior cervical chain or axillae bilaterally.  Gait normal with good balance and coordination.  MSK:  Non tender with full range of motion and good stability and symmetric strength and tone of shoulders, elbows, wrist, hip, knee and ankles bilaterally.     Impression and Recommendations:     This case required medical decision making of moderate complexity. The above documentation has been reviewed and is accurate and complete Lyndal Pulley, DO       Note: This dictation was  prepared with Dragon dictation along with smaller phrase technology. Any transcriptional errors that result from this process are unintentional.

## 2018-10-20 ENCOUNTER — Ambulatory Visit: Payer: PPO | Admitting: Family Medicine

## 2018-10-20 DIAGNOSIS — Z0289 Encounter for other administrative examinations: Secondary | ICD-10-CM

## 2018-10-23 ENCOUNTER — Other Ambulatory Visit: Payer: Self-pay | Admitting: Family Medicine

## 2018-10-23 ENCOUNTER — Other Ambulatory Visit: Payer: Self-pay | Admitting: Pulmonary Disease

## 2018-11-05 ENCOUNTER — Other Ambulatory Visit: Payer: Self-pay | Admitting: Family Medicine

## 2018-11-22 ENCOUNTER — Other Ambulatory Visit: Payer: Self-pay | Admitting: Pulmonary Disease

## 2018-11-27 ENCOUNTER — Ambulatory Visit (INDEPENDENT_AMBULATORY_CARE_PROVIDER_SITE_OTHER): Payer: PPO

## 2018-11-27 ENCOUNTER — Other Ambulatory Visit: Payer: Self-pay

## 2018-11-27 VITALS — BP 144/72

## 2018-11-27 DIAGNOSIS — Z0000 Encounter for general adult medical examination without abnormal findings: Secondary | ICD-10-CM | POA: Diagnosis not present

## 2018-11-27 NOTE — Progress Notes (Signed)
This visit is being conducted via phone call due to the COVID-19 pandemic. This patient has given me verbal consent via phone to conduct this visit, patient states they are participating from their home address. Some vital signs may be absent or patient reported.   Patient identification: identified by name, DOB, and current address.   Subjective:   Roy [name] is a 85 y.o. male who presents for Medicare Annual/Subsequent preventive examination.  Review of Systems:   Cardiac Risk Factors include: advanced age (>50men, >21 women);diabetes mellitus;dyslipidemia;male gender;hypertension     Objective:    Vitals: BP (!) 144/72   There is no height or weight on file to calculate BMI.  Advanced Directives 11/27/2018 04/04/2018 04/04/2018 04/03/2018 03/01/2018 02/07/2017 02/03/2016  Does Patient Have a Medical Advance Directive? No - No No No Yes Yes  Would patient like information on creating a medical advance directive? Yes (MAU/Ambulatory/Procedural Areas - Information given) Yes (Inpatient - patient requests chaplain consult to create a medical advance directive) Yes (Inpatient - patient requests chaplain consult to create a medical advance directive) No - Patient declined No - Patient declined - -  Pre-existing out of facility DNR order (yellow form or pink MOST form) - - - - - - -    Tobacco Social History   Tobacco Use  Smoking Status Former Smoker  . Packs/day: 1.50  . Years: 8.00  . Pack years: 12.00  . Types: Cigarettes  . Quit date: 03/05/1958  . Years since quitting: 60.7  Smokeless Tobacco Never Used     Counseling given: Not Answered   Clinical Intake:  Pre-visit preparation completed: Yes  Pain : 0-10 Pain Score: 3  Pain Type: Acute pain Pain Location: Shoulder Pain Orientation: Right Pain Onset: More than a month ago Pain Frequency: Intermittent   Diabetes: Yes CBG done?: No Did pt. bring in CBG monitor from home?: No(reports cbgs to be elevated possibly  due to pain)  How often do you need to have someone help you when you read instructions, pamphlets, or other written materials from your doctor or pharmacy?: 2 - Rarely  Interpreter Needed?: No  Information entered by :: Denman George LPN  Past Medical History:  Diagnosis Date  . Anginal pain ([place])   . Arthritis    spine  . Asthma   . CAD (coronary artery disease)    s/p PCI of the D1 with cath in 2013 showing nonobstructive disease.  CP presumed due to small vessel disease.  [name] CAP (community acquired pneumonia) 12/26/2011  . Chronic kidney disease    renal insufficiency  . Diabetes mellitus, type 2 ([place])   . Fever 12/26/2011  . GERD (gastroesophageal reflux disease)   . Heart murmur   . Hyperlipidemia   . Hypertension   . Lyme disease    states had shakes that were thought to be parkinson's related but related to tick bite, states also RMSF  . Myocardial infarction ([place])   . Shortness of breath   . Sleep apnea    intolerant to PAP   Past Surgical History:  Procedure Laterality Date  . APPENDECTOMY    . CERVICAL LAMINECTOMY    . CHOLECYSTECTOMY    . ESOPHAGOGASTRODUODENOSCOPY N/A 04/04/2018   Procedure: ESOPHAGOGASTRODUODENOSCOPY (EGD);  Surgeon: Lin Landsman, MD;  Location: [facility] ENDOSCOPY;  Service: Gastroenterology;  Laterality: N/A;  . KIDNEY STONE SURGERY    . LEFT HEART CATH N/A 02/15/2012   Procedure: LEFT HEART CATH;  Surgeon: Larey Dresser,  MD;  Location: [facility] CATH LAB;  Service: Cardiovascular;  Laterality: N/A;   Family History  Problem Relation Age of Onset  . ALS Father   . Asthma Daughter   . Cancer Sister        breast/liver   Social History   Socioeconomic History  . Marital status: Married    Spouse name: Not on file  . Number of children: Not on file  . Years of education: Not on file  . Highest education level: Not on file  Occupational History  . Not on file  Social Needs  . Financial resource strain: Not on file  . Food  insecurity    Worry: Not on file    Inability: Not on file  . Transportation needs    Medical: Not on file    Non-medical: Not on file  Tobacco Use  . Smoking status: Former Smoker    Packs/day: 1.50    Years: 8.00    Pack years: 12.00    Types: Cigarettes    Quit date: 03/05/1958    Years since quitting: 60.7  . Smokeless tobacco: Never Used  Substance and Sexual Activity  . Alcohol use: No    Alcohol/week: 0.0 standard drinks  . Drug use: No  . Sexual activity: Not Currently  Lifestyle  . Physical activity    Days per week: Not on file    Minutes per session: Not on file  . Stress: Not on file  Relationships  . Social Herbalist on phone: Not on file    Gets together: Not on file    Attends religious service: Not on file    Active member of club or organization: Not on file    Attends meetings of clubs or organizations: Not on file    Relationship status: Not on file  Other Topics Concern  . Not on file  Social History Narrative   Married (wife patient of Dr. Yong Channel). 3 children. 4 grandchildren. 3 greatgrandchildren.       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 acetaminophen (TYLENOL) 325 MG tablet Take 650 mg by mouth every evening. Reported on 06/28/2015  . albuterol (PROVENTIL HFA;VENTOLIN HFA) 108 (90 Base) MCG/ACT inhaler Inhale 2 puffs into the lungs every 4 (four) hours as needed for wheezing or shortness of breath.  [name] amLODipine (NORVASC) 2.5 MG tablet Take 1 tablet by mouth once daily  . aspirin EC 81 MG tablet Take 1 tablet (81 mg total) by mouth daily.  . ASSURE COMFORT LANCETS 30G MISC 1 each by Other route daily.  [name] atorvastatin (LIPITOR) 10 MG tablet Take 1 tablet by mouth once daily  . calcium-vitamin D (OSCAL WITH D) 500-200 MG-UNIT per tablet Take 1 tablet by mouth daily.  [name] glimepiride (AMARYL) 4 MG tablet Take 1  tablet (4 mg total) by mouth daily with breakfast.  . glucose blood (ACCU-CHEK AVIVA PLUS) test strip Test blood glucose daily.  [name] glucose blood test strip One Touch Ultra Test Strips  . guaiFENesin (MUCINEX) 600 MG 12 hr tablet Take by mouth 2 (two) times daily.  . isosorbide mononitrate (IMDUR) 30 MG 24 hr tablet TAKE 3 TABLETS BY MOUTH ONCE DAILY  . lisinopril (ZESTRIL) 5 MG tablet Take 1/2 (one-half) tablet by mouth once daily  . metFORMIN (GLUCOPHAGE) 500 MG tablet TAKE 1/2 (ONE-HALF) TABLET  BY MOUTH ONCE DAILY WITH BREAKFAST  . metoprolol succinate (TOPROL-XL) 50 MG 24 hr tablet TAKE 1 TABLET BY MOUTH ONCE DAILY(TAKE  WITH  OR  IMMEDIATELY  FOLLOWING  A  MEAL)  . nitroGLYCERIN (NITROSTAT) 0.4 MG SL tablet Place 1 tablet (0.4 mg total) under the tongue every 5 (five) minutes as needed for chest pain.  . Omega-3 Fatty Acids (FISH OIL PO) Take 2 capsules by mouth 2 (two) times daily.  [name] omeprazole (PRILOSEC) 20 MG capsule Take 1 capsule by mouth twice daily  . OVER THE COUNTER MEDICATION Take 1 tablet by mouth at bedtime. Legatrim PM  . Polyethyl Glycol-Propyl Glycol (SYSTANE OP) Place 1 drop into both eyes 4 (four) times daily. Itchy/dry eye  . potassium citrate (UROCIT-K) 10 MEQ (1080 MG) SR tablet Take 10 mEq by mouth Twice daily.  . ranolazine (RANEXA) 500 MG 12 hr tablet Take 1 tablet (500 mg total) by mouth 2 (two) times daily. Please keep upcoming appt for future refills.  . SYMBICORT 160-4.5 MCG/ACT inhaler Inhale 2 puffs by mouth twice daily  . traMADol (ULTRAM) 50 MG tablet TAKE 1 TABLET BY MOUTH EVERY 6 HOURS AS NEEDED FOR MODERATE PAIN. (TWICE PER DAY MAXIMUM)  . traZODone (DESYREL) 150 MG tablet Take 1 tablet by mouth once daily  . zinc sulfate 220 MG capsule Take 220 mg by mouth daily.   No facility-administered encounter medications on file as of 11/27/2018.     Activities of Daily Living In your present state of health, do you have any difficulty performing the following  activities: 11/27/2018 04/04/2018  Hearing? N N  Vision? N N  Difficulty concentrating or making decisions? N N  Walking or climbing stairs? N N  Dressing or bathing? N N  Doing errands, shopping? N N  Preparing Food and eating ? N -  Using the Toilet? N -  In the past six months, have you accidently leaked urine? N -  Do you have problems with loss of bowel control? N -  Managing your Medications? N -  Managing your Finances? N -  Housekeeping or managing your Housekeeping? N -  Some recent data might be hidden    Patient Care Team: Marin Olp, MD as PCP - General (Family Medicine) Sueanne Margarita, MD as Consulting Physician (Cardiology) Juanito Doom, MD as Consulting Physician (Pulmonary Disease) [facility], Michae Kava, MD as Attending Physician Harriett Sine, MD as Consulting Physician (Dermatology) Rutherford Guys, MD as Consulting Physician (Ophthalmology) Lyndal Pulley, DO as Consulting Physician (Sports Medicine)   Assessment:   This is a routine wellness examination for Rafal.  Exercise Activities and Dietary recommendations Current Exercise Habits: Home exercise routine, Type of exercise: walking;Other - see comments(yardwork activities), Time (Minutes): 30, Frequency (Times/Week): 5, Weekly Exercise (Minutes/Week): 150, Intensity: Mild  Goals    . patient     Keep maintaining and staying active     . patient     Continue to stay healthy; Keep walking 3 to 4 times a week        Fall Risk Fall Risk  11/27/2018 12/09/2017 02/07/2017 02/03/2016 06/28/2015  Falls in the past year? 0 No No Yes No  Number falls in past yr: 0 - - 1 -  Injury with Fall? 0 - - - -  Follow up Education provided;Falls evaluation completed;Falls prevention discussed - - Education provided -   Is the patient's home free of loose throw rugs in walkways, pet beds, electrical cords, etc?  yes      Grab bars in the bathroom? yes      Handrails on the stairs?   yes       Adequate lighting?   yes  Depression Screen PHQ 2/9 Scores 11/27/2018 04/17/2018 12/09/2017 02/07/2017  PHQ - 2 Score 0 0 0 0    Cognitive Function-no cognitive concerns at this time  MMSE - Mini Mental State Exam 02/07/2017 02/03/2016  Not completed: (No Data) (No Data)     6CIT Screen 11/27/2018  What Year? 0 points  What month? 0 points  What time? 0 points  Count back from 20 0 points  Months in reverse 0 points  Repeat phrase 0 points  Total Score 0    Immunization History  Administered Date(s) Administered  . Influenza Split 12/04/2010, 01/23/2012  . Influenza Whole 12/26/2006, 11/28/2007, 11/24/2008, 12/30/2009  . Influenza, High Dose Seasonal PF 01/13/2013, 12/10/2013, 11/21/2017, 11/19/2018  . Influenza-Unspecified 10/31/2015, 11/21/2016  . Pneumococcal Conjugate-13 07/27/2014  . Pneumococcal Polysaccharide-23 06/27/2005  . Td 06/27/2005, 04/09/2017  . Zoster 10/31/2015  . Zoster Recombinat (Shingrix) 09/30/2018    Qualifies for Shingles Vaccine? Discussed and patient will check with pharmacy for coverage.  Patient education handout provided.  Patient aware that he needs to have 2nd shot.    Screening Tests Health Maintenance  Topic Date Due  . FOOT EXAM  08/09/2018  . INFLUENZA VACCINE  10/04/2018  . OPHTHALMOLOGY EXAM  01/14/2019  . HEMOGLOBIN A1C  03/04/2019  . TETANUS/TDAP  04/10/2027  . PNA vac Low Risk Adult  Completed   Cancer Screenings: Lung: Low Dose CT Chest recommended if Age 46-80 years, 30 pack-year currently smoking OR have quit w/in 15years. Patient does not qualify. Colorectal: not indicated       Plan:  I have personally reviewed and addressed the Medicare Annual Wellness questionnaire and have noted the following in the patient's chart:  A. Medical and social history B. Use of alcohol, tobacco or illicit drugs  C. Current medications and supplements D. Functional ability and status E.  Nutritional status F.  Physical activity G.  Advance directives H. List of other physicians I.  Hospitalizations, surgeries, and ER visits in previous 12 months J.  [place] such as hearing and vision if needed, cognitive and depression L. Referrals, records requested, and appointments- none   In addition, I have reviewed and discussed with patient certain preventive protocols, quality metrics, and best practice recommendations. A written personalized care plan for preventive services as well as general preventive health recommendations were provided to patient.   Signed,  Denman George, LPN  Nurse Health Advisor   Nurse Notes: No additional

## 2018-11-27 NOTE — Patient Instructions (Signed)
Roy [name] , Thank you for taking time to come for your Medicare Wellness Visit. I appreciate your ongoing commitment to your health goals. Please review the following plan we discussed and let me know if I can assist you in the future.   Screening recommendations/referrals: Colorectal Screening: not indicated   Vision and Dental Exams: Recommended annual ophthalmology exams for early detection of glaucoma and other disorders of the eye Recommended annual dental exams for proper oral hygiene  Diabetic Exams: Diabetic Eye Exam: up to date; due November 2020 Diabetic Foot Exam: at next office visit   Vaccinations: Influenza vaccine: completed at pharmacy  Pneumococcal vaccine: up to date; last 12/08/13 (Dr. Yong Channel may recommend booster at next visit)  Tdap vaccine: up to date; last 04/23/12  Shingles vaccine: You've had #1 in the series of 2.  Your second will be due after 12/01/18 but before 04/02/19  Advanced directives: Advance directives discussed with you today. I have provided a copy for you to complete at home and have notarized. Once this is complete please bring a copy in to our office so we can scan it into your chart.  Goals: Recommend to drink at least 6-8 8oz glasses of water per day and increase your intake of fresh fruits and vegetables.   Next appointment: Please schedule your Annual Wellness Visit with your Nurse Health Advisor in one year.  Preventive Care 83 Years and Older, Male Preventive care refers to lifestyle choices and visits with your health care provider that can promote health and wellness. What does preventive care include?  A yearly physical exam. This is also called an annual well check.  Dental exams once or twice a year.  Routine eye exams. Ask your health care provider how often you should have your eyes checked.  Personal lifestyle choices, including:  Daily care of your teeth and gums.  Regular physical activity.  Eating a healthy  diet.  Avoiding tobacco and drug use.  Limiting alcohol use.  Practicing safe sex.  Taking low doses of aspirin every day if recommended by your health care provider..  Taking vitamin and mineral supplements as recommended by your health care provider. What happens during an annual well check? The services and screenings done by your health care provider during your annual well check will depend on your age, overall health, lifestyle risk factors, and family history of disease. Counseling  Your health care provider may ask you questions about your:  Alcohol use.  Tobacco use.  Drug use.  Emotional well-being.  Home and relationship well-being.  Sexual activity.  Eating habits.  History of falls.  Memory and ability to understand (cognition).  Work and work Statistician. Screening  You may have the following tests or measurements:  Height, weight, and BMI.  Blood pressure.  Lipid and cholesterol levels. These may be checked every 5 years, or more frequently if you are over 38 years old.  Skin check.  Lung cancer screening. You may have this screening every year starting at age 60 if you have a 30-pack-year history of smoking and currently smoke or have quit within the past 15 years.  Fecal occult blood test (FOBT) of the stool. You may have this test every year starting at age 77.  Flexible sigmoidoscopy or colonoscopy. You may have a sigmoidoscopy every 5 years or a colonoscopy every 10 years starting at age 52.  Prostate cancer screening. Recommendations will vary depending on your family history and other risks.  Hepatitis C blood test.  Hepatitis B blood test.  Sexually transmitted disease (STD) testing.  Diabetes screening. This is done by checking your blood sugar (glucose) after you have not eaten for a while (fasting). You may have this done every 1-3 years.  Abdominal aortic aneurysm (AAA) screening. You may need this if you are a current or former  smoker.  Osteoporosis. You may be screened starting at age 75 if you are at high risk. Talk with your health care provider about your test results, treatment options, and if necessary, the need for more tests. Vaccines  Your health care provider may recommend certain vaccines, such as:  Influenza vaccine. This is recommended every year.  Tetanus, diphtheria, and acellular pertussis (Tdap, Td) vaccine. You may need a Td booster every 10 years.  Zoster vaccine. You may need this after age 71.  Pneumococcal 13-valent conjugate (PCV13) vaccine. One dose is recommended after age 52.  Pneumococcal polysaccharide (PPSV23) vaccine. One dose is recommended after age 2. Talk to your health care provider about which screenings and vaccines you need and how often you need them. This information is not intended to replace advice given to you by your health care provider. Make sure you discuss any questions you have with your health care provider. Document Released: 03/18/2015 Document Revised: 11/09/2015 Document Reviewed: 12/21/2014 Elsevier Interactive Patient Education  2017 [employer] Prevention in the Home Falls can cause injuries. They can happen to people of all ages. There are many things you can do to make your home safe and to help prevent falls. What can I do on the outside of my home?  Regularly fix the edges of walkways and driveways and fix any cracks.  Remove anything that might make you trip as you walk through a door, such as a raised step or threshold.  Trim any bushes or trees on the path to your home.  Use bright outdoor lighting.  Clear any walking paths of anything that might make someone trip, such as rocks or tools.  Regularly check to see if handrails are loose or broken. Make sure that both sides of any steps have handrails.  Any raised decks and porches should have guardrails on the edges.  Have any leaves, snow, or ice cleared regularly.  Use sand or  salt on walking paths during winter.  Clean up any spills in your garage right away. This includes oil or grease spills. What can I do in the bathroom?  Use night lights.  Install grab bars by the toilet and in the tub and shower. Do not use towel bars as grab bars.  Use non-skid mats or decals in the tub or shower.  If you need to sit down in the shower, use a plastic, non-slip stool.  Keep the floor dry. Clean up any water that spills on the floor as soon as it happens.  Remove soap buildup in the tub or shower regularly.  Attach bath mats securely with double-sided non-slip rug tape.  Do not have throw rugs and other things on the floor that can make you trip. What can I do in the bedroom?  Use night lights.  Make sure that you have a light by your bed that is easy to reach.  Do not use any sheets or blankets that are too big for your bed. They should not hang down onto the floor.  Have a firm chair that has side arms. You can use this for support while you get dressed.  Do not  have throw rugs and other things on the floor that can make you trip. What can I do in the kitchen?  Clean up any spills right away.  Avoid walking on wet floors.  Keep items that you use a lot in easy-to-reach places.  If you need to reach something above you, use a strong step stool that has a grab bar.  Keep electrical cords out of the way.  Do not use floor polish or wax that makes floors slippery. If you must use wax, use non-skid floor wax.  Do not have throw rugs and other things on the floor that can make you trip. What can I do with my stairs?  Do not leave any items on the stairs.  Make sure that there are handrails on both sides of the stairs and use them. Fix handrails that are broken or loose. Make sure that handrails are as long as the stairways.  Check any carpeting to make sure that it is firmly attached to the stairs. Fix any carpet that is loose or worn.  Avoid having  throw rugs at the top or bottom of the stairs. If you do have throw rugs, attach them to the floor with carpet tape.  Make sure that you have a light switch at the top of the stairs and the bottom of the stairs. If you do not have them, ask someone to add them for you. What else can I do to help prevent falls?  Wear shoes that:  Do not have high heels.  Have rubber bottoms.  Are comfortable and fit you well.  Are closed at the toe. Do not wear sandals.  If you use a stepladder:  Make sure that it is fully opened. Do not climb a closed stepladder.  Make sure that both sides of the stepladder are locked into place.  Ask someone to hold it for you, if possible.  Clearly mark and make sure that you can see:  Any grab bars or handrails.  First and last steps.  Where the edge of each step is.  Use tools that help you move around (mobility aids) if they are needed. These include:  Canes.  Walkers.  Scooters.  Crutches.  Turn on the lights when you go into a dark area. Replace any light bulbs as soon as they burn out.  Set up your furniture so you have a clear path. Avoid moving your furniture around.  If any of your floors are uneven, fix them.  If there are any pets around you, be aware of where they are.  Review your medicines with your doctor. Some medicines can make you feel dizzy. This can increase your chance of falling. Ask your doctor what other things that you can do to help prevent falls. This information is not intended to replace advice given to you by your health care provider. Make sure you discuss any questions you have with your health care provider. Document Released: 12/16/2008 Document Revised: 07/28/2015 Document Reviewed: 03/26/2014 Elsevier Interactive Patient Education  2017 [employer].

## 2018-11-27 NOTE — Progress Notes (Signed)
I have reviewed and agree with note, evaluation, plan.   BP Readings from Last 3 Encounters:  11/27/18 (!) 144/72  09/03/18 110/70  08/19/18 129/68  Blood pressure elevated for first time today- did he take his medications this day? Was blood pressure repeated? If he took meds and BP elevated on repeat- please schedule follow up visit with me within next few weeks  [name], MD

## 2018-12-01 ENCOUNTER — Other Ambulatory Visit: Payer: Self-pay | Admitting: Pulmonary Disease

## 2018-12-06 ENCOUNTER — Other Ambulatory Visit: Payer: Self-pay | Admitting: Family Medicine

## 2018-12-10 NOTE — Progress Notes (Signed)
[facility] [address], [place] [address] Phone: [phone] Subjective:   [name], am serving as a scribe for Dr. Hulan Saas.  I'm seeing this patient by the request  of:    CC: Shoulder pain follow-up  YIR:[id number]   09/03/2018 Injected as well.  Discussed icing regimen and home exercise.  Discussed which activities to do which was to avoid.  I do believe that the calcific changes is what is more the concerning aspect.  Patient has been in great hands with primary care provider.  Patient will follow-up with me again in 4 weeks.  Significant calcific tendinitis noted.  Difficult to assess if there is any true tearing secondary to the amount of calcium.  Discussed with patient in great length about home exercises and patient work with trainer.  Discussed icing regimen, home exercise, which activities of doing which wants to avoid.  Follow-up again in 4 to 6 weeks  Update 12/11/2018 Roy [name] is a 85 y.o. male coming in with complaint of right shoulder pain. Patient states his arm is not any better than last visit. Re-injured shoulder 2 weeks ago when he was cutting some limbs with his nephew. Patient is having pain all the way down to his fingertips from anterior shoulder. Unable to move arm overhead. Injection did help prior to the new injury. Having a hard time sleeping.  Patient states initially was feeling significantly better but now unfortunately unable to even left arm after new injury.   Last injections of the right shoulder in the acromioclavicular joint were done on September 03, 2018  Past Medical History:  Diagnosis Date  . Anginal pain ([place])   . Arthritis    spine  . Asthma   . CAD (coronary artery disease)    s/p PCI of the D1 with cath in 2013 showing nonobstructive disease.  CP presumed due to small vessel disease.  [name] CAP (community acquired pneumonia) 12/26/2011  . Chronic kidney disease    renal insufficiency  .  Diabetes mellitus, type 2 ([place])   . Fever 12/26/2011  . GERD (gastroesophageal reflux disease)   . Heart murmur   . Hyperlipidemia   . Hypertension   . Lyme disease    states had shakes that were thought to be parkinson's related but related to tick bite, states also RMSF  . Myocardial infarction ([place])   . Shortness of breath   . Sleep apnea    intolerant to PAP   Past Surgical History:  Procedure Laterality Date  . APPENDECTOMY    . CERVICAL LAMINECTOMY    . CHOLECYSTECTOMY    . ESOPHAGOGASTRODUODENOSCOPY N/A 04/04/2018   Procedure: ESOPHAGOGASTRODUODENOSCOPY (EGD);  Surgeon: Lin Landsman, MD;  Location: [facility];  Service: Gastroenterology;  Laterality: N/A;  . KIDNEY STONE SURGERY    . LEFT HEART CATH N/A 02/15/2012   Procedure: LEFT HEART CATH;  Surgeon: Larey Dresser, MD;  Location: [facility] CATH LAB;  Service: Cardiovascular;  Laterality: N/A;   Social History   Socioeconomic History  . Marital status: Married    Spouse name: Not on file  . Number of children: Not on file  . Years of education: Not on file  . Highest education level: Not on file  Occupational History  . Not on file  Social Needs  . Financial resource strain: Not on file  . Food insecurity    Worry: Not on file    Inability: Not on file  .  Transportation needs    Medical: Not on file    Non-medical: Not on file  Tobacco Use  . Smoking status: Former Smoker    Packs/day: 1.50    Years: 8.00    Pack years: 12.00    Types: Cigarettes    Quit date: 03/05/1958    Years since quitting: 60.8  . Smokeless tobacco: Never Used  Substance and Sexual Activity  . Alcohol use: No    Alcohol/week: 0.0 standard drinks  . Drug use: No  . Sexual activity: Not Currently  Lifestyle  . Physical activity    Days per week: Not on file    Minutes per session: Not on file  . Stress: Not on file  Relationships  . Social Herbalist on phone: Not on file    Gets together: Not on file     Attends religious service: Not on file    Active member of club or organization: Not on file    Attends meetings of clubs or organizations: Not on file    Relationship status: Not on file  Other Topics Concern  . Not on file  Social History Narrative   Married (wife patient of Dr. Yong Channel). 3 children. 4 grandchildren. 3 greatgrandchildren.       Retired from [employer]: rabbit hunting, time with dogs   Allergies  Allergen Reactions  . Codeine Sulfate Other (See Comments)    Chest pain  . Crestor [Rosuvastatin Calcium]   . Cephalexin Rash   Family History  Problem Relation Age of Onset  . ALS Father   . Asthma Daughter   . Cancer Sister        breast/liver    Current Outpatient Medications (Endocrine & Metabolic):  .  glimepiride (AMARYL) 4 MG tablet, Take 1 tablet (4 mg total) by mouth daily with breakfast. .  metFORMIN (GLUCOPHAGE) 500 MG tablet, TAKE 1/2 (ONE-HALF) TABLET BY MOUTH ONCE DAILY WITH BREAKFAST  Current Outpatient Medications (Cardiovascular):  .  amLODipine (NORVASC) 2.5 MG tablet, Take 1 tablet by mouth once daily .  atorvastatin (LIPITOR) 10 MG tablet, Take 1 tablet by mouth once daily .  isosorbide mononitrate (IMDUR) 30 MG 24 hr tablet, TAKE 3 TABLETS BY MOUTH ONCE DAILY .  lisinopril (ZESTRIL) 5 MG tablet, Take 1/2 (one-half) tablet by mouth once daily .  metoprolol succinate (TOPROL-XL) 50 MG 24 hr tablet, TAKE 1 TABLET BY MOUTH ONCE DAILY(TAKE  WITH  OR  IMMEDIATELY  FOLLOWING  A  MEAL) .  nitroGLYCERIN (NITROSTAT) 0.4 MG SL tablet, Place 1 tablet (0.4 mg total) under the tongue every 5 (five) minutes as needed for chest pain. .  ranolazine (RANEXA) 500 MG 12 hr tablet, Take 1 tablet (500 mg total) by mouth 2 (two) times daily. Please keep upcoming appt for future refills.  Current Outpatient Medications (Respiratory):  .  albuterol (PROVENTIL HFA;VENTOLIN HFA) 108 (90 Base) MCG/ACT inhaler, Inhale 2 puffs into the  lungs every 4 (four) hours as needed for wheezing or shortness of breath. .  guaiFENesin (MUCINEX) 600 MG 12 hr tablet, Take by mouth 2 (two) times daily. .  SYMBICORT 160-4.5 MCG/ACT inhaler, INHALE 2 PUFFS BY MOUTH TWICE DAILY. NEEDS OFFICE VISIT FOR FUTURE REFILLS.  Current Outpatient Medications (Analgesics):  .  acetaminophen (TYLENOL) 325 MG tablet, Take 650 mg by mouth every evening. Reported on 06/28/2015 .  aspirin EC 81 MG tablet, Take 1 tablet (81 mg total) by  mouth daily. .  traMADol (ULTRAM) 50 MG tablet, TAKE 1 TABLET BY MOUTH EVERY 6 HOURS AS NEEDED FOR MODERATE PAIN. (TWICE PER DAY MAXIMUM) .  HYDROcodone-acetaminophen (NORCO/VICODIN) 5-325 MG tablet, Take 1 tablet by mouth every 8 (eight) hours as needed for up to 5 days for moderate pain.   Current Outpatient Medications (Other):  [name]  ASSURE COMFORT LANCETS 30G MISC, 1 each by Other route daily. .  calcium-vitamin D (OSCAL WITH D) 500-200 MG-UNIT per tablet, Take 1 tablet by mouth daily. [name]  glucose blood (ACCU-CHEK AVIVA PLUS) test strip, Test blood glucose daily. [name]  glucose blood test strip, One Touch Ultra Test Strips .  Omega-3 Fatty Acids (FISH OIL PO), Take 2 capsules by mouth 2 (two) times daily. [name]  omeprazole (PRILOSEC) 20 MG capsule, Take 1 capsule by mouth twice daily .  OVER THE COUNTER MEDICATION, Take 1 tablet by mouth at bedtime. Legatrim PM .  Polyethyl Glycol-Propyl Glycol (SYSTANE OP), Place 1 drop into both eyes 4 (four) times daily. Itchy/dry eye .  potassium citrate (UROCIT-K) 10 MEQ (1080 MG) SR tablet, Take 10 mEq by mouth Twice daily. .  traZODone (DESYREL) 150 MG tablet, Take 1 tablet by mouth once daily .  zinc sulfate 220 MG capsule, Take 220 mg by mouth daily.    Past medical history, social, surgical and family history all reviewed in electronic medical record.  No pertanent information unless stated regarding to the chief complaint.   Review of Systems:  No headache, visual changes, nausea,  vomiting, diarrhea, constipation, dizziness, abdominal pain, skin rash, fevers, chills, night sweats, weight loss, swollen lymph nodes, body aches, joint swelling, muscle aches, chest pain, shortness of breath, mood changes.   Objective  Blood pressure 110/62, pulse 66, height 5\' 9"  (1.753 m), weight 184 lb (83.5 kg), SpO2 98 %.    General: No apparent distress alert and oriented x3 mood and affect normal, dressed appropriately.  HEENT: Pupils equal, extraocular movements intact  Respiratory: Patient's speak in full sentences and does not appear short of breath  Cardiovascular: No lower extremity edema, non tender, no erythema  Skin: Warm dry intact with no signs of infection or rash on extremities or on axial skeleton.  Abdomen: Soft nontender  Neuro: Cranial nerves II through XII are intact, neurovascularly intact in all extremities with 2+ DTRs and 2+ pulses.  Lymph: No lymphadenopathy of posterior or anterior cervical chain or axillae bilaterally.  Gait normal with good balance and coordination.  MSK:  Non tender with full range of motion and good stability and symmetric strength and tone of , elbows, wrist, hip, knee and ankles bilaterally.  Arthritic changes of multiple joints Right shoulder exam shows the patient does have some very mild atrophy of the musculature surrounding it.  Patient does have some decreased range of motion in all planes with mild crepitus noted.  Patient does have a positive painful arc noted.  Patient is unable to even lift actively now greater than 30 degrees of forward flexion.  Significant weakness of the rotator cuff noted.  Limited musculoskeletal ultrasound was performed and interpreted by Lyndal Pulley  Limited ultrasound of patient's arm shows new rotator cuff tear with 1.2 cm of retraction of the supraspinatus noted.  Arthritic changes noted.  Mild high riding humeral head noted     Impression and Recommendations:     This case required medical  decision making of moderate complexity. The above documentation has been reviewed and is accurate and complete  Lyndal Pulley, DO       Note: This dictation was prepared with Dragon dictation along with smaller phrase technology. Any transcriptional errors that result from this process are unintentional.

## 2018-12-11 ENCOUNTER — Encounter: Payer: Self-pay | Admitting: Family Medicine

## 2018-12-11 ENCOUNTER — Other Ambulatory Visit: Payer: Self-pay

## 2018-12-11 ENCOUNTER — Ambulatory Visit: Payer: PPO | Admitting: Family Medicine

## 2018-12-11 ENCOUNTER — Ambulatory Visit: Payer: Self-pay

## 2018-12-11 VITALS — BP 110/62 | HR 66 | Ht 69.0 in | Wt 184.0 lb

## 2018-12-11 DIAGNOSIS — S46011A Strain of muscle(s) and tendon(s) of the rotator cuff of right shoulder, initial encounter: Secondary | ICD-10-CM | POA: Diagnosis not present

## 2018-12-11 DIAGNOSIS — M25511 Pain in right shoulder: Secondary | ICD-10-CM | POA: Diagnosis not present

## 2018-12-11 DIAGNOSIS — G8929 Other chronic pain: Secondary | ICD-10-CM

## 2018-12-11 DIAGNOSIS — M75101 Unspecified rotator cuff tear or rupture of right shoulder, not specified as traumatic: Secondary | ICD-10-CM | POA: Insufficient documentation

## 2018-12-11 MED ORDER — METFORMIN HCL 500 MG PO TABS: ORAL_TABLET | ORAL | 0 refills | Status: DC

## 2018-12-11 MED ORDER — HYDROCODONE-ACETAMINOPHEN 5-325 MG PO TABS: 1.0000 | ORAL_TABLET | Freq: Three times a day (TID) | ORAL | 0 refills | Status: DC | PRN

## 2018-12-11 MED ORDER — HYDROCODONE-ACETAMINOPHEN 5-325 MG PO TABS: 1.0000 | ORAL_TABLET | Freq: Three times a day (TID) | ORAL | 0 refills | Status: AC | PRN

## 2018-12-11 NOTE — Assessment & Plan Note (Signed)
Patient has a rotator cuff tear.  Did have some mild arthritic changes previously.  Unfortunately now 2 weeks ago did have a new injury that I think caused the true rupture.  Patient at this point will need further evaluation with advanced imaging.  Patient does have arthritic changes and depending what is found on MRI this could change surgical management.  I do believe that patient is active enough so he could potentially surgical candidate even though to his advanced age.  Follow-up again in 4 to 8 weeks

## 2018-12-11 NOTE — Patient Instructions (Signed)
MRI at [facility] Percocet called into pharmacy Will be in touch

## 2018-12-19 ENCOUNTER — Other Ambulatory Visit: Payer: Self-pay | Admitting: Family Medicine

## 2018-12-19 DIAGNOSIS — N183 Chronic kidney disease, stage 3 unspecified: Secondary | ICD-10-CM

## 2018-12-19 DIAGNOSIS — E1122 Type 2 diabetes mellitus with diabetic chronic kidney disease: Secondary | ICD-10-CM

## 2018-12-21 ENCOUNTER — Other Ambulatory Visit: Payer: Self-pay

## 2018-12-21 ENCOUNTER — Ambulatory Visit
Admission: RE | Admit: 2018-12-21 | Discharge: 2018-12-21 | Disposition: A | Discharge status: Home / Self Care | Payer: PPO | Source: Ambulatory Visit | Attending: Family Medicine | Admitting: Family Medicine

## 2018-12-21 DIAGNOSIS — M25511 Pain in right shoulder: Secondary | ICD-10-CM | POA: Diagnosis not present

## 2018-12-21 DIAGNOSIS — G8929 Other chronic pain: Secondary | ICD-10-CM

## 2018-12-22 ENCOUNTER — Other Ambulatory Visit: Payer: Self-pay

## 2018-12-22 DIAGNOSIS — M25511 Pain in right shoulder: Secondary | ICD-10-CM

## 2018-12-22 DIAGNOSIS — G8929 Other chronic pain: Secondary | ICD-10-CM

## 2018-12-25 NOTE — Progress Notes (Signed)
Phone [phone]   Subjective:  Roy [name] is a 85 y.o. year old very pleasant male patient who presents for/with See problem oriented charting Chief Complaint  Patient presents with  . Follow-up  . Hypertension  . Diabetes  . Hyperlipidemia   ROS- back pain tolerable lately. No fever/chills. Is having a lot of right shoulder pain    08/03/15 Pt uses [facility] for diabetic Supplies Medication compliance: compliant all of the time, diabetic diet compliance: compliant all of the time, home glucose monitoring: is performed sporadically, further diabetic ROS: no polyuria or polydipsia, no chest pain, dyspnea or TIA's, no numbness, tingling or pain in extremities.  Past Medical History-  Patient Active Problem List   Diagnosis Date Noted  . Type II diabetes mellitus with renal manifestations ([place]) 09/11/2006    Priority: High  . Coronary artery disease with angina pectoris ([place]) 09/11/2006    Priority: High  . Thrombocytopenia ([place]) 02/23/2015    Priority: Medium  . Mild persistent asthma 10/30/2010    Priority: Medium  . CKD (chronic kidney disease), stage III 02/22/2009    Priority: Medium  . Hyperlipidemia associated with type 2 diabetes mellitus ([place]) 09/11/2006    Priority: Medium  . Hypertension associated with diabetes ([place]) 09/11/2006    Priority: Medium  . Back pain 09/11/2006    Priority: Medium  . Sleep apnea 09/11/2006    Priority: Medium  . Senile purpura ([place]) 08/08/2017    Priority: Low  . GERD (gastroesophageal reflux disease) 05/03/2014    Priority: Low  . Insomnia 05/03/2014    Priority: Low  . Allergic rhinitis 04/21/2014    Priority: Low  . Giardia 04/21/2014    Priority: Low  . NEPHROLITHIASIS, RECURRENT 01/21/2009    Priority: Low  . ACTINIC KERATOSIS, HEAD 06/14/2008    Priority: Low  . Right rotator cuff tear 12/11/2018  . Calcific tendinitis of right shoulder 09/03/2018  . AC (acromioclavicular) arthritis 09/03/2018  . Pain  of right thumb 04/08/2018  . Food impaction of esophagus 04/03/2018    Medications- reviewed and updated Current Outpatient Medications  Medication Sig Dispense Refill  . acetaminophen (TYLENOL) 325 MG tablet Take 650 mg by mouth every evening. Reported on 06/28/2015    . albuterol (PROVENTIL HFA;VENTOLIN HFA) 108 (90 Base) MCG/ACT inhaler Inhale 2 puffs into the lungs every 4 (four) hours as needed for wheezing or shortness of breath. 1 Inhaler 10  . amLODipine (NORVASC) 2.5 MG tablet Take 1 tablet by mouth once daily 90 tablet 1  . aspirin EC 81 MG tablet Take 1 tablet (81 mg total) by mouth daily.    . ASSURE COMFORT LANCETS 30G MISC 1 each by Other route daily.    [name] atorvastatin (LIPITOR) 10 MG tablet Take 1 tablet by mouth once daily 90 tablet 0  . calcium-vitamin D (OSCAL WITH D) 500-200 MG-UNIT per tablet Take 1 tablet by mouth daily.    [name] glimepiride (AMARYL) 4 MG tablet TAKE 1 & 1/2 (ONE & ONE-HALF) TABLETS BY MOUTH ONCE DAILY (Patient taking differently: 4 mg. One daily) 135 tablet 0  . glucose blood (ACCU-CHEK AVIVA PLUS) test strip Test blood glucose daily. 100 each 12  . glucose blood test strip One Touch Ultra Test Strips 100 each 12  . guaiFENesin (MUCINEX) 600 MG 12 hr tablet Take by mouth 2 (two) times daily.    . isosorbide mononitrate (IMDUR) 30 MG 24 hr tablet Take 3 tablets by mouth once daily 270 tablet 0  .  lisinopril (ZESTRIL) 5 MG tablet Take 1/2 (one-half) tablet by mouth once daily 45 tablet 1  . metFORMIN (GLUCOPHAGE) 500 MG tablet TAKE 1/2 (ONE-HALF) TABLET BY MOUTH ONCE DAILY WITH BREAKFAST 45 tablet 0  . metoprolol succinate (TOPROL-XL) 50 MG 24 hr tablet TAKE 1 TABLET BY MOUTH ONCE DAILY(TAKE  WITH  OR  IMMEDIATELY  FOLLOWING  A  MEAL) 90 tablet 0  . nitroGLYCERIN (NITROSTAT) 0.4 MG SL tablet Place 1 tablet (0.4 mg total) under the tongue every 5 (five) minutes as needed for chest pain. 25 tablet 1  . Omega-3 Fatty Acids (FISH OIL PO) Take 2 capsules by mouth 2  (two) times daily.    [name] omeprazole (PRILOSEC) 20 MG capsule Take 1 capsule by mouth twice daily 180 capsule 1  . OVER THE COUNTER MEDICATION Take 1 tablet by mouth at bedtime. Legatrim PM    . Polyethyl Glycol-Propyl Glycol (SYSTANE OP) Place 1 drop into both eyes 4 (four) times daily. Itchy/dry eye    . potassium citrate (UROCIT-K) 10 MEQ (1080 MG) SR tablet Take 10 mEq by mouth Twice daily.    . ranolazine (RANEXA) 500 MG 12 hr tablet Take 1 tablet (500 mg total) by mouth 2 (two) times daily. Please keep upcoming appt for future refills. 180 tablet 3  . SYMBICORT 160-4.5 MCG/ACT inhaler INHALE 2 PUFFS BY MOUTH TWICE DAILY. 11 g 5  . traMADol (ULTRAM) 50 MG tablet TAKE 1 TABLET BY MOUTH EVERY 6 HOURS AS NEEDED FOR MODERATE PAIN. (TWICE PER DAY MAXIMUM) 60 tablet 2  . traZODone (DESYREL) 150 MG tablet Take 1 tablet by mouth once daily 90 tablet 1  . zinc sulfate 220 MG capsule Take 220 mg by mouth daily.     No current facility-administered medications for this visit.      Objective:  BP 120/64   Pulse 68   Temp 98.1 F (36.7 C) (Temporal)   Ht 5\' 9"  (1.753 m)   Wt 183 lb 9.6 oz (83.3 kg)   SpO2 97%   BMI 27.11 kg/m  Gen: NAD, resting comfortably CV: RRR no murmurs rubs or gallops Lungs: CTAB no crackles, wheeze, rhonchi Abdomen: soft/nontender/nondistended/normal bowel sounds. Ext: no edema Skin: warm, dry Msk: tries to hold right shoulder in place  Diabetic Foot Exam - Simple   Simple Foot Form Diabetic Foot exam was performed with the following findings: Yes 12/30/2018 10:00 AM  Visual Inspection No deformities, no ulcerations, no other skin breakdown bilaterally: Yes Sensation Testing Intact to touch and monofilament testing bilaterally: Yes Pulse Check Posterior Tibialis and Dorsalis pulse intact bilaterally: Yes Comments onychomycosis noted on 1st and 2nd toe on right- no history of cellulitis       Assessment and Plan   #Presurgical evaluation-cardiac portion  of this will be covered by cardiology for upcoming shoulder surgery after right rotator cuff tear Dr. Tamera Punt [facility]-patient follows with Dr. Radford Pax.  Will update labs today but suspect stable from a medical perspective for surgery.  # Diabetes S:compliant with  Metformin 500mg  half tablet with breakfast, glimepiride 4 mg (reduced from 6 mg due to history of lows) .  Last A1c mildly elevated at 7.2. does not check sugar often- our team was to refill strips- last check 117 in AM Lab Results  Component Value Date   HGBA1C 7.2 (H) 09/02/2018   HGBA1C 6.8 (H) 04/17/2018   HGBA1C 5.7 (A) 12/09/2017  Is the patient taking medications without problems? Yes. Does the patient complain of muscle  aches?  No. Trying to exercise on a regular basis? Yes. Compliant with diet? Yes. A/P: Hopefully improved or at least stable-update A1c today. Continue current medications.    # CAD with angina/Hyperlipidemia S:compliant with atorvastatin 10mg  and aspirin 81mg . LDL goal <70 . Asymptomatic while on ranexa and imdur 90 mg Lab Results  Component Value Date   CHOL 124 09/02/2018   HDL 43.70 09/02/2018   LDLCALC 60 09/02/2018   LDLDIRECT 42.0 04/17/2018   TRIG 100.0 09/02/2018   CHOLHDL 3 09/02/2018   Review: taking medications as instructed, no medication side effects noted, no TIAs, no chest pain on exertion, no dyspnea on exertion, no swelling of ankles. Smoker: No. A/P: Excellent control in June-continue current medications for lipids.  Asymptomatic in regards to coronary artery disease-continue current medications  # Hypertension/CKD III S:compliant with  Amlodipine 2.5, imdur 390mg , lisinopril 5 mg, metoprolol 50mg  XR -on lisinopril in case proteinuric element. GFR usually 30-40 range. Avoids nsaids.  BP Readings from Last 3 Encounters:  12/30/18 120/64  12/11/18 110/62  11/27/18 (!) 144/72  A/P:  Blood pressure Stable. Hopefully kidneys stable- update cmp today.  Continue current  medications.     # Asthma S: compliant with symbicort through Dr. Lake Bells. Patient has been doing well with. He has not had need for rescue inhaler at all. Taking Symbicort twice a day only. No wheezing or shortness of breath at all.  A/P: Stable. Continue current medications.    # insomnia - limited by pain at the moment. Still using trazodone to try to sleep  # potassium citrate for kidney stones- has new kidney doctor per his report  #Mild thrombocytopenia-baseline appears to be between 120 and 150-update platelets with labs today.  Patient also has had some mild anemia-given thrombocytopenia and anemia will check pathology smear review as well as ferritin level  Recommended follow up: Scheduled already in December for physical Future Appointments  Date Time Provider [place]  02/18/2019  9:20 AM Marin Olp, MD [facility]   Lab/Order associations:   ICD-10-CM   1. Type 2 diabetes mellitus with diabetic nephropathy, without long-term current use of insulin (HCC)  E11.21 Comprehensive metabolic panel    CBC with Differential/Platelet    LDL cholesterol, direct    Hemoglobin A1c  2. Coronary artery disease involving native coronary artery of native heart with angina pectoris ([place])  I25.119   3. Hypertension associated with diabetes ([place])  E11.59    I10   4. Hyperlipidemia associated with type 2 diabetes mellitus ([place])  E11.69    E78.5   5. Mild persistent asthma without complication  G64.40   6. Stage 3 chronic kidney disease, unspecified whether stage 3a or 3b CKD  N18.30   7. Thrombocytopenia (HCC)  D69.6 IBC + Ferritin    Pathologist smear review  8. Anemia, unspecified type  D64.9 IBC + Ferritin    Pathologist smear review    Meds ordered this encounter  Medications  . SYMBICORT 160-4.5 MCG/ACT inhaler    Sig: INHALE 2 PUFFS BY MOUTH TWICE DAILY.    Dispense:  11 g    Refill:  5    Refilling as transitioning pulmonary docs right now  . traMADol  (ULTRAM) 50 MG tablet    Sig: TAKE 1 TABLET BY MOUTH EVERY 6 HOURS AS NEEDED FOR MODERATE PAIN. (TWICE PER DAY MAXIMUM)    Dispense:  60 tablet    Refill:  2  . glucose blood (ACCU-CHEK AVIVA PLUS) test strip  Sig: Test blood glucose daily.    Dispense:  100 each    Refill:  12    Faxed to mail order pharmacy  Tramadol refilled-uses sparingly for back pain-also can use for shoulder right now if needed-has to avoid NSAIDs  Return precautions advised.  [name], MD

## 2018-12-25 NOTE — Patient Instructions (Addendum)
Health Maintenance Due  Topic Date Due  . FOOT EXAM-done today by Dr. Yong Channel 08/09/2018   No changes planned for today. I can clear you for surgery from medical perspective as long as labs ok today.   Stop by desk to push physical out perhaps 2-3 months from December one currently scheduled    Please stop by lab before you go If you do not have mychart- we will call you about results within 5 business days of [place] receiving them.  If you have mychart- we will send your results within 3 business days of [place] receiving them.  If abnormal or we want to clarify a result, we will call or mychart you to make sure you receive the message.  If you have questions or concerns or don't hear within 5-7 days, please send [place] a message or call us.

## 2018-12-27 ENCOUNTER — Other Ambulatory Visit: Payer: Self-pay | Admitting: Family Medicine

## 2018-12-29 DIAGNOSIS — M75121 Complete rotator cuff tear or rupture of right shoulder, not specified as traumatic: Secondary | ICD-10-CM | POA: Diagnosis not present

## 2018-12-30 ENCOUNTER — Other Ambulatory Visit: Payer: Self-pay | Admitting: Pulmonary Disease

## 2018-12-30 ENCOUNTER — Ambulatory Visit (INDEPENDENT_AMBULATORY_CARE_PROVIDER_SITE_OTHER): Payer: PPO | Admitting: Family Medicine

## 2018-12-30 ENCOUNTER — Other Ambulatory Visit: Payer: Self-pay

## 2018-12-30 ENCOUNTER — Encounter: Payer: Self-pay | Admitting: Family Medicine

## 2018-12-30 VITALS — BP 120/64 | HR 68 | Temp 98.1°F | Ht 69.0 in | Wt 183.6 lb

## 2018-12-30 DIAGNOSIS — E1169 Type 2 diabetes mellitus with other specified complication: Secondary | ICD-10-CM

## 2018-12-30 DIAGNOSIS — D649 Anemia, unspecified: Secondary | ICD-10-CM

## 2018-12-30 DIAGNOSIS — I10 Essential (primary) hypertension: Secondary | ICD-10-CM

## 2018-12-30 DIAGNOSIS — I25119 Atherosclerotic heart disease of native coronary artery with unspecified angina pectoris: Secondary | ICD-10-CM

## 2018-12-30 DIAGNOSIS — E1159 Type 2 diabetes mellitus with other circulatory complications: Secondary | ICD-10-CM

## 2018-12-30 DIAGNOSIS — D696 Thrombocytopenia, unspecified: Secondary | ICD-10-CM | POA: Diagnosis not present

## 2018-12-30 DIAGNOSIS — N183 Chronic kidney disease, stage 3 unspecified: Secondary | ICD-10-CM | POA: Diagnosis not present

## 2018-12-30 DIAGNOSIS — E785 Hyperlipidemia, unspecified: Secondary | ICD-10-CM | POA: Diagnosis not present

## 2018-12-30 DIAGNOSIS — J4530 Mild persistent asthma, uncomplicated: Secondary | ICD-10-CM | POA: Diagnosis not present

## 2018-12-30 DIAGNOSIS — E1121 Type 2 diabetes mellitus with diabetic nephropathy: Secondary | ICD-10-CM | POA: Diagnosis not present

## 2018-12-30 LAB — COMPREHENSIVE METABOLIC PANEL
ALT: 11 U/L (ref 0–53)
AST: 12 U/L (ref 0–37)
Albumin: 4.3 g/dL (ref 3.5–5.2)
Alkaline Phosphatase: 79 U/L (ref 39–117)
BUN: 35 mg/dL — ABNORMAL HIGH (ref 6–23)
CO2: 32 mEq/L (ref 19–32)
Calcium: 9.4 mg/dL (ref 8.4–10.5)
Chloride: 101 mEq/L (ref 96–112)
Creatinine, Ser: 2.03 mg/dL — ABNORMAL HIGH (ref 0.40–1.50)
GFR: 31.44 mL/min — ABNORMAL LOW (ref >60.00)
Glucose, Bld: 177 mg/dL — ABNORMAL HIGH (ref 70–99)
Potassium: 4.3 mEq/L (ref 3.5–5.1)
Sodium: 139 mEq/L (ref 135–145)
Total Bilirubin: 0.7 mg/dL (ref 0.2–1.2)
Total Protein: 6.4 g/dL (ref 6.0–8.3)

## 2018-12-30 LAB — CBC WITH DIFFERENTIAL/PLATELET
Basophils Absolute: 0.1 10*3/uL (ref 0.0–0.1)
Basophils Relative: 0.7 % (ref 0.0–3.0)
Eosinophils Absolute: 1.8 10*3/uL — ABNORMAL HIGH (ref 0.0–0.7)
Eosinophils Relative: 19.4 % — ABNORMAL HIGH (ref 0.0–5.0)
HCT: 38.2 % — ABNORMAL LOW (ref 39.0–52.0)
Hemoglobin: 12.6 g/dL — ABNORMAL LOW (ref 13.0–17.0)
Lymphocytes Relative: 17.8 % (ref 12.0–46.0)
Lymphs Abs: 1.7 10*3/uL (ref 0.7–4.0)
MCHC: 33 g/dL (ref 30.0–36.0)
MCV: 88.7 fl (ref 78.0–100.0)
Monocytes Absolute: 0.6 10*3/uL (ref 0.1–1.0)
Monocytes Relative: 6.5 % (ref 3.0–12.0)
Neutro Abs: 5.3 10*3/uL (ref 1.4–7.7)
Neutrophils Relative %: 55.6 % (ref 43.0–77.0)
Platelets: 138 10*3/uL — ABNORMAL LOW (ref 150.0–400.0)
RBC: 4.31 Mil/uL (ref 4.22–5.81)
RDW: 14.1 % (ref 11.5–15.5)
WBC: 9.5 10*3/uL (ref 4.0–10.5)

## 2018-12-30 LAB — LDL CHOLESTEROL, DIRECT: Direct LDL: 55 mg/dL

## 2018-12-30 LAB — HEMOGLOBIN A1C: Hgb A1c MFr Bld: 6.7 % — ABNORMAL HIGH (ref 4.6–6.5)

## 2018-12-30 MED ORDER — SYMBICORT 160-4.5 MCG/ACT IN AERO: INHALATION_SPRAY | RESPIRATORY_TRACT | 5 refills | Status: DC

## 2018-12-30 MED ORDER — TRAMADOL HCL 50 MG PO TABS: ORAL_TABLET | ORAL | 2 refills | Status: DC

## 2018-12-30 MED ORDER — ACCU-CHEK AVIVA PLUS VI STRP: ORAL_STRIP | 12 refills | Status: DC

## 2018-12-31 ENCOUNTER — Telehealth: Payer: Self-pay | Admitting: *Deleted

## 2018-12-31 LAB — PATHOLOGIST SMEAR REVIEW

## 2018-12-31 NOTE — Telephone Encounter (Signed)
Follow up:    Patient calling to check on his Medical Clearance.

## 2018-12-31 NOTE — Telephone Encounter (Signed)
Please inform patient to hold aspirin for 5-7 days prior to surgery and restart as soon as possible afterward.

## 2018-12-31 NOTE — Telephone Encounter (Signed)
   [facility] Pre-operative Risk Assessment    Request for surgical clearance:  1. What type of surgery is being performed? RIGHT SHOULDER ATROPIC CUFF REPAIR, SUBACROMIAL DECOMPRESSION, BICEP TENOTOMY   2. When is this surgery scheduled? TBD   3. What type of clearance is required (medical clearance vs. Pharmacy clearance to hold med vs. Both)? MEDICAL  4. Are there any medications that need to be held prior to surgery and how long? ASA    5. Practice name and name of physician performing surgery? [facility]; DR. Larkin Ina CHANDLER   6. What is your office phone number [phone]    7.   What is your office fax number [phone]  8.   Anesthesia type (None, local, MAC, general) ? NOT LISTED; GENERAL ?   Roy [name] 12/31/2018, 8:29 AM  _________________________________________________________________   (provider comments below)

## 2018-12-31 NOTE — Telephone Encounter (Signed)
Called to speak with Mr. Ervine Witucki. Patient was not available at the time his spouse Steward Drone Nakagawa who is on the patient's DPR I gave her the inforn=mation about Mr. Rigaud hold his ASA for 5-7 days prior to procedure and to restart as soon as possible afterwards. Mrs. Zuercher stated that she was writing it down and thank me for calling. Mrs. Newbrough read back to me what Mr. Murfin needed to do as far as holding his ASA for 5-7 days and to restart as soon as possible. Spouse Mrs. Hegwood verbalized an understanding and all if any questions were answered.

## 2018-12-31 NOTE — Telephone Encounter (Signed)
   Primary Cardiologist: Fransico Him, MD  Chart reviewed as part of pre-operative protocol coverage. Patient was contacted 12/31/2018 in reference to pre-operative risk assessment for pending surgery as outlined below.  Roy [name] was last seen on 08/06/2018 via virtual visit by Dr. Radford Pax.  Since that day, Roy [name] has done well without any exertional chest pain or shortness of breath. He is able to complete 4 METS of activity without any issue. Only current problem is severe shoulder pain which prevent him from getting a good sleep.  Therefore, based on ACC/AHA guidelines, the patient would be at acceptable risk for the planned procedure without further cardiovascular testing.   I will route this recommendation to the requesting party via Epic fax function and remove from pre-op pool.  Please call with questions. Dr. Tamera Punt recommended to hold aspirin prior to the procedure, given the history of coronary artery disease, I will check with Dr. Radford Pax to see if she is ok with holding aspirin or does she prefer him to stay on aspirin thought the procedure.   [place], [place] 12/31/2018, 10:34 AM

## 2018-12-31 NOTE — Telephone Encounter (Signed)
Ok to hold ASA for procedure 

## 2019-01-01 ENCOUNTER — Telehealth: Payer: Self-pay | Admitting: Family Medicine

## 2019-01-01 LAB — IBC + FERRITIN
Ferritin: 46.9 ng/mL (ref 22.0–322.0)
Iron: 88 ug/dL (ref 42–165)
Saturation Ratios: 24.1 % (ref 20.0–50.0)
Transferrin: 261 mg/dL (ref 212.0–360.0)

## 2019-01-01 NOTE — Progress Notes (Signed)
Iron stores are okay.  Please make sure patient got last message about pathology smear review.  Luckily hemoglobin A1c is under goal of 7 at 6.7.  Kidney function is stable and chronic kidney disease stage III range.  Liver is normal.  Cholesterol is reasonably well controlled.  Remains anemic with low platelets.  Eosinophils were high-see note on pathology smear review but make sure patient is referred to hematology

## 2019-01-01 NOTE — Telephone Encounter (Signed)
See note

## 2019-01-01 NOTE — Telephone Encounter (Signed)
Please call Colletta Maryland or [facility] to advise if you received the surgical clearance form for this Pt

## 2019-01-02 NOTE — Telephone Encounter (Signed)
Colletta Maryland with [facility] calling again for clearance.  Please let her know if you have not received  [phone]

## 2019-01-05 ENCOUNTER — Other Ambulatory Visit: Payer: Self-pay

## 2019-01-05 DIAGNOSIS — D696 Thrombocytopenia, unspecified: Secondary | ICD-10-CM

## 2019-01-05 NOTE — Telephone Encounter (Signed)
Called and spoke with Colletta Maryland and informed her that we have not received a fax yet and she will resend it to [place].

## 2019-01-07 ENCOUNTER — Telehealth: Payer: Self-pay | Admitting: Oncology

## 2019-01-07 ENCOUNTER — Other Ambulatory Visit: Payer: Self-pay | Admitting: Orthopedic Surgery

## 2019-01-07 NOTE — Telephone Encounter (Signed)
See below

## 2019-01-07 NOTE — Telephone Encounter (Signed)
Why was this sent to me? 

## 2019-01-07 NOTE — Telephone Encounter (Signed)
I received a new hem referral from Dr. Yong Channel for thrombocytopenia. I cld and spoke to Mrs. Eggleton and scheduled the pt to see Dr. Alen Blew on 11/10 at 11am. Mrs. Nicolls has been made aware her husband should arrive 15 minutes early.

## 2019-01-07 NOTE — Telephone Encounter (Signed)
Colletta Maryland calling back about clearance. She states that she has sent it multiple times. She is requesting a call back.

## 2019-01-08 NOTE — Telephone Encounter (Signed)
Called and let stephanie know that we have the form and are faxing back to her now.

## 2019-01-13 ENCOUNTER — Inpatient Hospital Stay: Payer: PPO | Attending: Oncology | Admitting: Oncology

## 2019-01-13 ENCOUNTER — Other Ambulatory Visit: Payer: Self-pay

## 2019-01-13 VITALS — BP 138/60 | HR 59 | Temp 98.0°F | Resp 17 | Ht 69.0 in | Wt 190.9 lb

## 2019-01-13 DIAGNOSIS — K219 Gastro-esophageal reflux disease without esophagitis: Secondary | ICD-10-CM | POA: Diagnosis not present

## 2019-01-13 DIAGNOSIS — I2510 Atherosclerotic heart disease of native coronary artery without angina pectoris: Secondary | ICD-10-CM | POA: Diagnosis not present

## 2019-01-13 DIAGNOSIS — Z79899 Other long term (current) drug therapy: Secondary | ICD-10-CM | POA: Diagnosis not present

## 2019-01-13 DIAGNOSIS — Z7982 Long term (current) use of aspirin: Secondary | ICD-10-CM | POA: Insufficient documentation

## 2019-01-13 DIAGNOSIS — J45909 Unspecified asthma, uncomplicated: Secondary | ICD-10-CM | POA: Insufficient documentation

## 2019-01-13 DIAGNOSIS — R011 Cardiac murmur, unspecified: Secondary | ICD-10-CM | POA: Insufficient documentation

## 2019-01-13 DIAGNOSIS — I129 Hypertensive chronic kidney disease with stage 1 through stage 4 chronic kidney disease, or unspecified chronic kidney disease: Secondary | ICD-10-CM | POA: Diagnosis not present

## 2019-01-13 DIAGNOSIS — E785 Hyperlipidemia, unspecified: Secondary | ICD-10-CM | POA: Diagnosis not present

## 2019-01-13 DIAGNOSIS — E1122 Type 2 diabetes mellitus with diabetic chronic kidney disease: Secondary | ICD-10-CM | POA: Insufficient documentation

## 2019-01-13 DIAGNOSIS — D696 Thrombocytopenia, unspecified: Secondary | ICD-10-CM | POA: Diagnosis not present

## 2019-01-13 NOTE — Progress Notes (Signed)
Reason for the request:    Thrombocytopenia  HPI: I was asked by Dr. Yong Channel  to evaluate Roy [name] for diagnosis of thrombocytopenia.  He is an 85 year old man with few comorbid conditions that include coronary disease, diabetes and hypertension.  He was noted to have thrombocytopenia on a CBC on December 30, 2018.  His blood count was 138.  His hemoglobin was 12.6 with eosinophilia and normal white cell count.  Laboratory data dating back to 2009 had similar pattern with platelet count fluctuating as low as 99,000 and as high as over 130 approaching normal range.  He also had fluctuating significantly since that time.  He did they sustained an injury to his right shoulder and planning to have surgical repair in the near future.  He denies any bleeding issues including hematochezia, melena or hemoptysis.  He did report occasional bruising related to his low-dose aspirin.  He does not report any headaches, blurry vision, syncope or seizures. Does not report any fevers, chills or sweats.  Does not report any cough, wheezing or hemoptysis.  Does not report any chest pain, palpitation, orthopnea or leg edema.  Does not report any nausea, vomiting or abdominal pain.  Does not report any constipation or diarrhea.  Does not report any skeletal complaints.    Does not report frequency, urgency or hematuria.  Does not report any skin rashes or lesions. Does not report any heat or cold intolerance.  Does not report any lymphadenopathy or petechiae.  Does not report any anxiety or depression.  Remaining review of systems is negative.    Past Medical History:  Diagnosis Date  . Anginal pain ([place])   . Arthritis    spine  . Asthma   . CAD (coronary artery disease)    s/p PCI of the D1 with cath in 2013 showing nonobstructive disease.  CP presumed due to small vessel disease.  [name] CAP (community acquired pneumonia) 12/26/2011  . Chronic kidney disease    renal insufficiency  . Diabetes mellitus, type 2 ([place])   .  Fever 12/26/2011  . GERD (gastroesophageal reflux disease)   . Heart murmur   . Hyperlipidemia   . Hypertension   . Lyme disease    states had shakes that were thought to be parkinson's related but related to tick bite, states also RMSF  . Myocardial infarction ([place])   . Shortness of breath   . Sleep apnea    intolerant to PAP  :  Past Surgical History:  Procedure Laterality Date  . APPENDECTOMY    . CERVICAL LAMINECTOMY    . CHOLECYSTECTOMY    . ESOPHAGOGASTRODUODENOSCOPY N/A 04/04/2018   Procedure: ESOPHAGOGASTRODUODENOSCOPY (EGD);  Surgeon: Lin Landsman, MD;  Location: [facility] ENDOSCOPY;  Service: Gastroenterology;  Laterality: N/A;  . KIDNEY STONE SURGERY    . LEFT HEART CATH N/A 02/15/2012   Procedure: LEFT HEART CATH;  Surgeon: Larey Dresser, MD;  Location: [facility] CATH LAB;  Service: Cardiovascular;  Laterality: N/A;  :   Current Outpatient Medications:  .  acetaminophen (TYLENOL) 325 MG tablet, Take 650 mg by mouth every evening. Reported on 06/28/2015, Disp: , Rfl:  .  albuterol (PROVENTIL HFA;VENTOLIN HFA) 108 (90 Base) MCG/ACT inhaler, Inhale 2 puffs into the lungs every 4 (four) hours as needed for wheezing or shortness of breath., Disp: 1 Inhaler, Rfl: 10 .  amLODipine (NORVASC) 2.5 MG tablet, Take 1 tablet by mouth once daily, Disp: 90 tablet, Rfl: 1 .  aspirin EC 81 MG tablet, Take  1 tablet (81 mg total) by mouth daily., Disp: , Rfl:  .  ASSURE COMFORT LANCETS 30G MISC, 1 each by Other route daily., Disp: , Rfl:  .  atorvastatin (LIPITOR) 10 MG tablet, Take 1 tablet by mouth once daily, Disp: 90 tablet, Rfl: 0 .  calcium-vitamin D (OSCAL WITH D) 500-200 MG-UNIT per tablet, Take 1 tablet by mouth daily., Disp: , Rfl:  .  glimepiride (AMARYL) 4 MG tablet, TAKE 1 & 1/2 (ONE & ONE-HALF) TABLETS BY MOUTH ONCE DAILY (Patient taking differently: 4 mg. One daily), Disp: 135 tablet, Rfl: 0 .  glucose blood (ACCU-CHEK AVIVA PLUS) test strip, Test blood glucose daily., Disp: 100  each, Rfl: 12 .  glucose blood test strip, One Touch Ultra Test Strips, Disp: 100 each, Rfl: 12 .  guaiFENesin (MUCINEX) 600 MG 12 hr tablet, Take by mouth 2 (two) times daily., Disp: , Rfl:  .  isosorbide mononitrate (IMDUR) 30 MG 24 hr tablet, Take 3 tablets by mouth once daily, Disp: 270 tablet, Rfl: 0 .  lisinopril (ZESTRIL) 5 MG tablet, Take 1/2 (one-half) tablet by mouth once daily, Disp: 45 tablet, Rfl: 1 .  metFORMIN (GLUCOPHAGE) 500 MG tablet, TAKE 1/2 (ONE-HALF) TABLET BY MOUTH ONCE DAILY WITH BREAKFAST, Disp: 45 tablet, Rfl: 0 .  metoprolol succinate (TOPROL-XL) 50 MG 24 hr tablet, TAKE 1 TABLET BY MOUTH ONCE DAILY(TAKE  WITH  OR  IMMEDIATELY  FOLLOWING  A  MEAL), Disp: 90 tablet, Rfl: 0 .  nitroGLYCERIN (NITROSTAT) 0.4 MG SL tablet, Place 1 tablet (0.4 mg total) under the tongue every 5 (five) minutes as needed for chest pain., Disp: 25 tablet, Rfl: 1 .  Omega-3 Fatty Acids (FISH OIL PO), Take 2 capsules by mouth 2 (two) times daily., Disp: , Rfl:  .  omeprazole (PRILOSEC) 20 MG capsule, Take 1 capsule by mouth twice daily, Disp: 180 capsule, Rfl: 1 .  OVER THE COUNTER MEDICATION, Take 1 tablet by mouth at bedtime. Legatrim PM, Disp: , Rfl:  .  Polyethyl Glycol-Propyl Glycol (SYSTANE OP), Place 1 drop into both eyes 4 (four) times daily. Itchy/dry eye, Disp: , Rfl:  .  potassium citrate (UROCIT-K) 10 MEQ (1080 MG) SR tablet, Take 10 mEq by mouth Twice daily., Disp: , Rfl:  .  ranolazine (RANEXA) 500 MG 12 hr tablet, Take 1 tablet (500 mg total) by mouth 2 (two) times daily. Please keep upcoming appt for future refills., Disp: 180 tablet, Rfl: 3 .  SYMBICORT 160-4.5 MCG/ACT inhaler, INHALE 2 PUFFS BY MOUTH TWICE DAILY., Disp: 11 g, Rfl: 5 .  traMADol (ULTRAM) 50 MG tablet, TAKE 1 TABLET BY MOUTH EVERY 6 HOURS AS NEEDED FOR MODERATE PAIN. (TWICE PER DAY MAXIMUM), Disp: 60 tablet, Rfl: 2 .  traZODone (DESYREL) 150 MG tablet, Take 1 tablet by mouth once daily, Disp: 90 tablet, Rfl: 1 .  zinc  sulfate 220 MG capsule, Take 220 mg by mouth daily., Disp: , Rfl: :  Allergies  Allergen Reactions  . Codeine Sulfate Other (See Comments)    Chest pain  . Crestor [Rosuvastatin Calcium]   . Cephalexin Rash  :  Family History  Problem Relation Age of Onset  . ALS Father   . Asthma Daughter   . Cancer Sister        breast/liver  :  Social History   Socioeconomic History  . Marital status: Married    Spouse name: Not on file  . Number of children: Not on file  . Years of education:  Not on file  . Highest education level: Not on file  Occupational History  . Not on file  Social Needs  . Financial resource strain: Not on file  . Food insecurity    Worry: Not on file    Inability: Not on file  . Transportation needs    Medical: Not on file    Non-medical: Not on file  Tobacco Use  . Smoking status: Former Smoker    Packs/day: 1.50    Years: 8.00    Pack years: 12.00    Types: Cigarettes    Quit date: 03/05/1958    Years since quitting: 60.9  . Smokeless tobacco: Never Used  Substance and Sexual Activity  . Alcohol use: No    Alcohol/week: 0.0 standard drinks  . Drug use: No  . Sexual activity: Not Currently  Lifestyle  . Physical activity    Days per week: Not on file    Minutes per session: Not on file  . Stress: Not on file  Relationships  . Social Herbalist on phone: Not on file    Gets together: Not on file    Attends religious service: Not on file    Active member of club or organization: Not on file    Attends meetings of clubs or organizations: Not on file    Relationship status: Not on file  . Intimate partner violence    Fear of current or ex partner: Not on file    Emotionally abused: Not on file    Physically abused: Not on file    Forced sexual activity: Not on file  Other Topics Concern  . Not on file  Social History Narrative   Married (wife patient of Dr. Yong Channel). 3 children. 4 grandchildren. 3 greatgrandchildren.        Retired from [employer]: rabbit hunting, time with dogs  :  Pertinent items are noted in HPI.  Exam: Blood pressure 138/60, pulse (!) 59, temperature 98 F (36.7 C), temperature source Temporal, resp. rate 17, height 5\' 9"  (1.753 m), weight 190 lb 14.4 oz (86.6 kg), SpO2 99 %.   ECOG 1 General appearance: alert and cooperative appeared without distress. Head: atraumatic without any abnormalities. Eyes: conjunctivae/corneas clear. PERRL.  Sclera anicteric. Throat: lips, mucosa, and tongue normal; without oral thrush or ulcers. Resp: clear to auscultation bilaterally without rhonchi, wheezes or dullness to percussion. Cardio: regular rate and rhythm, S1, S2 normal, no murmur, click, rub or gallop GI: soft, non-tender; bowel sounds normal; no masses,  no organomegaly Skin: Dark ecchymosis noted in upper arms. Lymph nodes: Cervical, supraclavicular, and axillary nodes normal. Neurologic: Grossly normal without any motor, sensory or deep tendon reflexes. Musculoskeletal: No joint deformity or effusion.   Roy [name]  Result Date: 12/22/2018 CLINICAL DATA:  Right shoulder pain, weakness and limited range of motion for 3 months. No known injury. EXAM: MRI OF THE RIGHT SHOULDER WITHOUT [name] TECHNIQUE: Multiplanar, multisequence Roy imaging of the shoulder was performed. No intravenous [name] was administered. COMPARISON:  None. FINDINGS: Rotator cuff: The patient has rotator cuff tendinopathy. There is a complete supraspinatus tear with retraction of 2-3 cm. The rotator cuff is otherwise intact. Muscles:  Normal without atrophy or focal lesion. Biceps long head: Intact with severe tendinopathy of the intra-articular segment identified. Acromioclavicular Joint: Moderate to moderately severe osteoarthritis. Type 2 acromion. There is fluid in the subacromial/subdeltoid bursa. Subacromial spurring is noted. Glenohumeral  Joint: Mild  degenerative change is seen. Labrum:  Intact. Bones:  No fracture or worrisome lesion. Other: None. IMPRESSION: Rotator cuff tendinopathy with a complete supraspinatus tendon tear. There is 2-3 cm of retraction but no atrophy. Severe appearing tendinopathy of the intra-articular segment of the long head of biceps. Moderate to moderately severe acromioclavicular osteoarthritis. Subacromial spurring also noted. Subacromial/subdeltoid fluid compatible with bursitis. Electronically Signed   By: Inge Rise M.D.   On: 12/22/2018 08:40    Assessment and Plan:   85 year old with:  1.  Thrombocytopenia dating back to at least 2009 with mild decline and close to normal range for the most part.  His platelet count on October 27 is 138 without any risk of bleeding.  The differential diagnosis was reviewed today and likely represents reactive findings at this time.  ITP, versus early MDS is also a consideration but are considered less likely.  No intervention is noted at this time given the mild fluctuation noted and he will be able to handle any surgery without any issues.  2.  Eosinophilia: Likely reactive in nature related to allergy or percent possibly medication.  I do not see any signs to suggest a myeloproliferative disorder or a primary eosinophilic disorder.  He does have a history of asthma and COPD which could be contributing.  The pattern of his eosinophilia do not indicate a malignant disorder.  3.  Follow-up: I am happy to see him in the future as needed.  No additional hematology work-up is needed.  40  minutes was spent with the patient face-to-face today.  More than 50% of time was spent on reviewing laboratory data for the last 10 years, differential diagnosis and management options for the future.  Thank you for the referral.  A copy of this consult has been forwarded to the requesting physician.

## 2019-01-14 ENCOUNTER — Encounter (HOSPITAL_BASED_OUTPATIENT_CLINIC_OR_DEPARTMENT_OTHER): Payer: Self-pay | Admitting: *Deleted

## 2019-01-14 ENCOUNTER — Other Ambulatory Visit: Payer: Self-pay

## 2019-01-16 ENCOUNTER — Other Ambulatory Visit (HOSPITAL_COMMUNITY)
Admission: RE | Admit: 2019-01-16 | Discharge: 2019-01-16 | Disposition: A | Discharge status: Home / Self Care | Payer: PPO | Source: Ambulatory Visit | Attending: Orthopedic Surgery | Admitting: Orthopedic Surgery

## 2019-01-16 DIAGNOSIS — Z20828 Contact with and (suspected) exposure to other viral communicable diseases: Secondary | ICD-10-CM | POA: Diagnosis not present

## 2019-01-16 DIAGNOSIS — Z01812 Encounter for preprocedural laboratory examination: Secondary | ICD-10-CM | POA: Diagnosis not present

## 2019-01-17 ENCOUNTER — Other Ambulatory Visit: Payer: Self-pay | Admitting: Family Medicine

## 2019-01-18 LAB — NOVEL CORONAVIRUS, NAA (HOSP ORDER, SEND-OUT TO REF LAB; TAT 18-24 HRS): SARS-CoV-2, NAA: NOT DETECTED

## 2019-01-19 NOTE — Anesthesia Preprocedure Evaluation (Addendum)
Anesthesia Evaluation  Patient identified by MRN, date of birth, ID band Patient awake    Reviewed: Allergy & Precautions, NPO status , Patient's Chart, lab work & pertinent test results, reviewed documented beta blocker date and time   Airway Mallampati: III  TM Distance: >3 FB Neck ROM: Full    Dental no notable dental hx. (+) Chipped   Pulmonary neg pulmonary ROS, shortness of breath, asthma , sleep apnea and Continuous Positive Airway Pressure Ventilation , pneumonia, resolved, former smoker,    Pulmonary exam normal breath sounds clear to auscultation       Cardiovascular hypertension, Pt. on medications and Pt. on home beta blockers + angina + CAD, + Past MI, + Cardiac Stents and + Peripheral Vascular Disease  negative cardio ROS Normal cardiovascular exam+ Valvular Problems/Murmurs  Rhythm:Regular Rate:Normal     Neuro/Psych negative neurological ROS  negative psych ROS   GI/Hepatic negative GI ROS, Neg liver ROS, GERD  ,  Endo/Other  negative endocrine ROSdiabetes, Type 2  Renal/GU Renal diseasenegative Renal ROS  negative genitourinary   Musculoskeletal negative musculoskeletal ROS (+) Arthritis ,   Abdominal   Peds negative pediatric ROS (+)  Hematology negative hematology ROS (+)   Anesthesia Other Findings EF 60-65 3 y ago. Neck movement ok.  Reproductive/Obstetrics negative OB ROS                            Anesthesia Physical  Anesthesia Plan  ASA: III  Anesthesia Plan: General   Post-op Pain Management: GA combined w/ Regional for post-op pain   Induction: Intravenous  PONV Risk Score and Plan: 2 and Ondansetron and Treatment may vary due to age or medical condition  Airway Management Planned: Oral ETT and Natural Airway  Additional Equipment:   Intra-op Plan:   Post-operative Plan: Extubation in OR  Informed Consent: I have reviewed the patients History and  Physical, chart, labs and discussed the procedure including the risks, benefits and alternatives for the proposed anesthesia with the patient or authorized representative who has indicated his/her understanding and acceptance.     Dental advisory given  Plan Discussed with: CRNA, Anesthesiologist and Surgeon  Anesthesia Plan Comments: (  )        Anesthesia Quick Evaluation

## 2019-01-19 NOTE — Progress Notes (Signed)
Reviewed creatinine level 2.03 with Dr Valma Cava (anesthesia). OK to proceed with surgery.

## 2019-01-20 ENCOUNTER — Telehealth: Payer: Self-pay | Admitting: Family Medicine

## 2019-01-20 ENCOUNTER — Encounter (HOSPITAL_BASED_OUTPATIENT_CLINIC_OR_DEPARTMENT_OTHER)
Admission: RE | Disposition: A | Discharge status: Home / Self Care | Payer: Self-pay | Source: Home / Self Care | Attending: Orthopedic Surgery

## 2019-01-20 ENCOUNTER — Other Ambulatory Visit: Payer: Self-pay

## 2019-01-20 ENCOUNTER — Ambulatory Visit (HOSPITAL_BASED_OUTPATIENT_CLINIC_OR_DEPARTMENT_OTHER): Payer: PPO | Admitting: Certified Registered"

## 2019-01-20 ENCOUNTER — Encounter (HOSPITAL_BASED_OUTPATIENT_CLINIC_OR_DEPARTMENT_OTHER): Payer: Self-pay | Admitting: *Deleted

## 2019-01-20 ENCOUNTER — Ambulatory Visit (HOSPITAL_BASED_OUTPATIENT_CLINIC_OR_DEPARTMENT_OTHER)
Admission: RE | Admit: 2019-01-20 | Discharge: 2019-01-20 | Disposition: A | Discharge status: Home / Self Care | Payer: PPO | Attending: Orthopedic Surgery | Admitting: Orthopedic Surgery

## 2019-01-20 DIAGNOSIS — Z87891 Personal history of nicotine dependence: Secondary | ICD-10-CM | POA: Diagnosis not present

## 2019-01-20 DIAGNOSIS — S46011A Strain of muscle(s) and tendon(s) of the rotator cuff of right shoulder, initial encounter: Secondary | ICD-10-CM | POA: Diagnosis not present

## 2019-01-20 DIAGNOSIS — J449 Chronic obstructive pulmonary disease, unspecified: Secondary | ICD-10-CM | POA: Diagnosis not present

## 2019-01-20 DIAGNOSIS — M7541 Impingement syndrome of right shoulder: Secondary | ICD-10-CM | POA: Diagnosis not present

## 2019-01-20 DIAGNOSIS — I2510 Atherosclerotic heart disease of native coronary artery without angina pectoris: Secondary | ICD-10-CM | POA: Insufficient documentation

## 2019-01-20 DIAGNOSIS — Z7982 Long term (current) use of aspirin: Secondary | ICD-10-CM | POA: Insufficient documentation

## 2019-01-20 DIAGNOSIS — E785 Hyperlipidemia, unspecified: Secondary | ICD-10-CM | POA: Diagnosis not present

## 2019-01-20 DIAGNOSIS — S46011D Strain of muscle(s) and tendon(s) of the rotator cuff of right shoulder, subsequent encounter: Secondary | ICD-10-CM | POA: Diagnosis not present

## 2019-01-20 DIAGNOSIS — Z7951 Long term (current) use of inhaled steroids: Secondary | ICD-10-CM | POA: Diagnosis not present

## 2019-01-20 DIAGNOSIS — Z79899 Other long term (current) drug therapy: Secondary | ICD-10-CM | POA: Insufficient documentation

## 2019-01-20 DIAGNOSIS — I129 Hypertensive chronic kidney disease with stage 1 through stage 4 chronic kidney disease, or unspecified chronic kidney disease: Secondary | ICD-10-CM | POA: Insufficient documentation

## 2019-01-20 DIAGNOSIS — Z888 Allergy status to other drugs, medicaments and biological substances status: Secondary | ICD-10-CM | POA: Diagnosis not present

## 2019-01-20 DIAGNOSIS — W208XXA Other cause of strike by thrown, projected or falling object, initial encounter: Secondary | ICD-10-CM | POA: Insufficient documentation

## 2019-01-20 DIAGNOSIS — E1122 Type 2 diabetes mellitus with diabetic chronic kidney disease: Secondary | ICD-10-CM | POA: Insufficient documentation

## 2019-01-20 DIAGNOSIS — G8918 Other acute postprocedural pain: Secondary | ICD-10-CM | POA: Diagnosis not present

## 2019-01-20 DIAGNOSIS — Z885 Allergy status to narcotic agent status: Secondary | ICD-10-CM | POA: Insufficient documentation

## 2019-01-20 DIAGNOSIS — G4730 Sleep apnea, unspecified: Secondary | ICD-10-CM | POA: Insufficient documentation

## 2019-01-20 DIAGNOSIS — Z7984 Long term (current) use of oral hypoglycemic drugs: Secondary | ICD-10-CM | POA: Insufficient documentation

## 2019-01-20 DIAGNOSIS — Z881 Allergy status to other antibiotic agents status: Secondary | ICD-10-CM | POA: Diagnosis not present

## 2019-01-20 DIAGNOSIS — M25811 Other specified joint disorders, right shoulder: Secondary | ICD-10-CM | POA: Insufficient documentation

## 2019-01-20 DIAGNOSIS — M479 Spondylosis, unspecified: Secondary | ICD-10-CM | POA: Insufficient documentation

## 2019-01-20 DIAGNOSIS — I252 Old myocardial infarction: Secondary | ICD-10-CM | POA: Insufficient documentation

## 2019-01-20 DIAGNOSIS — K219 Gastro-esophageal reflux disease without esophagitis: Secondary | ICD-10-CM | POA: Insufficient documentation

## 2019-01-20 DIAGNOSIS — N183 Chronic kidney disease, stage 3 unspecified: Secondary | ICD-10-CM | POA: Insufficient documentation

## 2019-01-20 DIAGNOSIS — M7521 Bicipital tendinitis, right shoulder: Secondary | ICD-10-CM | POA: Insufficient documentation

## 2019-01-20 DIAGNOSIS — E1151 Type 2 diabetes mellitus with diabetic peripheral angiopathy without gangrene: Secondary | ICD-10-CM | POA: Insufficient documentation

## 2019-01-20 HISTORY — PX: SHOULDER ARTHROSCOPY WITH ROTATOR CUFF REPAIR AND SUBACROMIAL DECOMPRESSION: SHX5686

## 2019-01-20 HISTORY — DX: Unspecified rotator cuff tear or rupture of right shoulder, not specified as traumatic: M75.101

## 2019-01-20 LAB — GLUCOSE, CAPILLARY
Glucose-Capillary: 145 mg/dL — ABNORMAL HIGH (ref 70–99)
Glucose-Capillary: 159 mg/dL — ABNORMAL HIGH (ref 70–99)

## 2019-01-20 SURGERY — SHOULDER ARTHROSCOPY WITH ROTATOR CUFF REPAIR AND SUBACROMIAL DECOMPRESSION
Anesthesia: General | Site: Shoulder | Laterality: Right

## 2019-01-20 MED ORDER — MEPERIDINE HCL 25 MG/ML IJ SOLN: 6.2500 mg | INTRAMUSCULAR | Status: DC | PRN

## 2019-01-20 MED ORDER — ONDANSETRON HCL 4 MG/2ML IJ SOLN
INTRAMUSCULAR | Status: AC
  Filled 2019-01-20: qty 10

## 2019-01-20 MED ORDER — FENTANYL CITRATE (PF) 100 MCG/2ML IJ SOLN: 25.0000 ug | INTRAMUSCULAR | Status: DC | PRN

## 2019-01-20 MED ORDER — BUPIVACAINE LIPOSOME 1.3 % IJ SUSP
INTRAMUSCULAR | Status: DC | PRN
  Administered 2019-01-20: 10 mL via PERINEURAL

## 2019-01-20 MED ORDER — MIDAZOLAM HCL 2 MG/2ML IJ SOLN: 1.0000 mg | INTRAMUSCULAR | Status: DC | PRN

## 2019-01-20 MED ORDER — ONDANSETRON HCL 4 MG/2ML IJ SOLN: 4.0000 mg | Freq: Once | INTRAMUSCULAR | Status: DC | PRN

## 2019-01-20 MED ORDER — OXYCODONE HCL 5 MG PO TABS: 5.0000 mg | ORAL_TABLET | Freq: Once | ORAL | Status: DC | PRN

## 2019-01-20 MED ORDER — VANCOMYCIN HCL IN DEXTROSE 1-5 GM/200ML-% IV SOLN
1000.0000 mg | INTRAVENOUS | Status: AC
  Administered 2019-01-20: 1000 mg via INTRAVENOUS

## 2019-01-20 MED ORDER — CHLORHEXIDINE GLUCONATE 4 % EX LIQD: 60.0000 mL | Freq: Once | CUTANEOUS | Status: DC

## 2019-01-20 MED ORDER — DEXAMETHASONE SODIUM PHOSPHATE 10 MG/ML IJ SOLN
INTRAMUSCULAR | Status: AC
  Filled 2019-01-20: qty 2

## 2019-01-20 MED ORDER — SODIUM CHLORIDE 0.9 % IR SOLN
Status: DC | PRN
  Administered 2019-01-20: 6000 mL

## 2019-01-20 MED ORDER — VANCOMYCIN HCL IN DEXTROSE 1-5 GM/200ML-% IV SOLN
INTRAVENOUS | Status: AC
  Filled 2019-01-20: qty 200

## 2019-01-20 MED ORDER — OXYCODONE HCL 5 MG/5ML PO SOLN: 5.0000 mg | Freq: Once | ORAL | Status: DC | PRN

## 2019-01-20 MED ORDER — BUPIVACAINE HCL 0.5 % IJ SOLN
INTRAMUSCULAR | Status: DC | PRN
  Administered 2019-01-20: 20 mL

## 2019-01-20 MED ORDER — HYDROCODONE-ACETAMINOPHEN 5-325 MG PO TABS: 1.0000 | ORAL_TABLET | Freq: Four times a day (QID) | ORAL | 0 refills | Status: DC | PRN

## 2019-01-20 MED ORDER — FENTANYL CITRATE (PF) 100 MCG/2ML IJ SOLN
INTRAMUSCULAR | Status: AC
  Filled 2019-01-20: qty 2

## 2019-01-20 MED ORDER — MIDAZOLAM HCL 2 MG/2ML IJ SOLN
INTRAMUSCULAR | Status: AC
  Filled 2019-01-20: qty 2

## 2019-01-20 MED ORDER — PROPOFOL 10 MG/ML IV BOLUS
INTRAVENOUS | Status: DC | PRN
  Administered 2019-01-20: 100 mg via INTRAVENOUS

## 2019-01-20 MED ORDER — PHENYLEPHRINE HCL-NACL 10-0.9 MG/250ML-% IV SOLN
INTRAVENOUS | Status: DC | PRN
  Administered 2019-01-20: 20 ug/min via INTRAVENOUS

## 2019-01-20 MED ORDER — LACTATED RINGERS IV SOLN
INTRAVENOUS | Status: DC
  Administered 2019-01-20: 07:00:00 via INTRAVENOUS

## 2019-01-20 MED ORDER — ACETAMINOPHEN 160 MG/5ML PO SOLN: 325.0000 mg | ORAL | Status: DC | PRN

## 2019-01-20 MED ORDER — ACETAMINOPHEN 325 MG PO TABS: 325.0000 mg | ORAL_TABLET | ORAL | Status: DC | PRN

## 2019-01-20 MED ORDER — PHENYLEPHRINE HCL (PRESSORS) 10 MG/ML IV SOLN
INTRAVENOUS | Status: AC
  Filled 2019-01-20: qty 2

## 2019-01-20 MED ORDER — LIDOCAINE HCL (CARDIAC) PF 100 MG/5ML IV SOSY
PREFILLED_SYRINGE | INTRAVENOUS | Status: DC | PRN
  Administered 2019-01-20: 30 mg via INTRAVENOUS

## 2019-01-20 MED ORDER — LIDOCAINE 2% (20 MG/ML) 5 ML SYRINGE
INTRAMUSCULAR | Status: AC
  Filled 2019-01-20: qty 15

## 2019-01-20 MED ORDER — DEXAMETHASONE SODIUM PHOSPHATE 4 MG/ML IJ SOLN
INTRAMUSCULAR | Status: DC | PRN
  Administered 2019-01-20: 5 mg via INTRAVENOUS

## 2019-01-20 MED ORDER — ONDANSETRON HCL 4 MG/2ML IJ SOLN
INTRAMUSCULAR | Status: DC | PRN
  Administered 2019-01-20: 4 mg via INTRAVENOUS

## 2019-01-20 MED ORDER — SUCCINYLCHOLINE CHLORIDE 200 MG/10ML IV SOSY
PREFILLED_SYRINGE | INTRAVENOUS | Status: AC
  Filled 2019-01-20: qty 10

## 2019-01-20 MED ORDER — FENTANYL CITRATE (PF) 100 MCG/2ML IJ SOLN
50.0000 ug | INTRAMUSCULAR | Status: DC | PRN
  Administered 2019-01-20: 07:00:00 50 ug via INTRAVENOUS

## 2019-01-20 MED ORDER — SUCCINYLCHOLINE CHLORIDE 20 MG/ML IJ SOLN
INTRAMUSCULAR | Status: DC | PRN
  Administered 2019-01-20: 100 mg via INTRAVENOUS

## 2019-01-20 SURGICAL SUPPLY — 77 items
AID PSTN UNV HD RSTRNT DISP (MISCELLANEOUS) ×2
ANCH SUT SWLK 19.1X4.75 VT (Anchor) ×4 IMPLANT
ANCHOR PEEK 4.75X19.1 SWLK C (Anchor) ×6 IMPLANT
APL PRP STRL LF DISP 70% ISPRP (MISCELLANEOUS) ×2
BLADE CLIPPER SURG (BLADE) IMPLANT
BLADE SURG 15 STRL LF DISP TIS (BLADE) IMPLANT
BLADE SURG 15 STRL SS (BLADE)
BURR OVAL 8 FLU 4.0MM X 13CM (MISCELLANEOUS) ×1
BURR OVAL 8 FLU 4.0X13 (MISCELLANEOUS) ×3 IMPLANT
CANNULA 5.75X7 CRYSTAL CLEAR (CANNULA) ×4 IMPLANT
CANNULA TWIST IN 8.25X7CM (CANNULA) ×3 IMPLANT
CHLORAPREP W/TINT 26 (MISCELLANEOUS) ×4 IMPLANT
COVER WAND RF STERILE (DRAPES) IMPLANT
CUTTER BONE 4.0MM X 13CM (MISCELLANEOUS) ×4 IMPLANT
DECANTER SPIKE VIAL GLASS SM (MISCELLANEOUS) IMPLANT
DRAPE IMP U-DRAPE 54X76 (DRAPES) ×4 IMPLANT
DRAPE INCISE IOBAN 66X45 STRL (DRAPES) ×4 IMPLANT
DRAPE STERI 35X30 U-POUCH (DRAPES) ×4 IMPLANT
DRAPE SURG 17X23 STRL (DRAPES) ×4 IMPLANT
DRAPE U-SHAPE 47X51 STRL (DRAPES) ×4 IMPLANT
DRAPE U-SHAPE 76X120 STRL (DRAPES) ×8 IMPLANT
DRSG PAD ABDOMINAL 8X10 ST (GAUZE/BANDAGES/DRESSINGS) ×4 IMPLANT
ELECT REM PT RETURN 9FT ADLT (ELECTROSURGICAL) ×4
ELECTRODE REM PT RTRN 9FT ADLT (ELECTROSURGICAL) ×2 IMPLANT
GAUZE 4X4 16PLY RFD (DISPOSABLE) IMPLANT
GAUZE SPONGE 4X4 12PLY STRL (GAUZE/BANDAGES/DRESSINGS) ×4 IMPLANT
GAUZE XEROFORM 1X8 LF (GAUZE/BANDAGES/DRESSINGS) ×4 IMPLANT
GLOVE BIO SURGEON STRL SZ7 (GLOVE) ×4 IMPLANT
GLOVE BIO SURGEON STRL SZ7.5 (GLOVE) ×4 IMPLANT
GLOVE BIOGEL PI IND STRL 7.0 (GLOVE) ×4 IMPLANT
GLOVE BIOGEL PI IND STRL 8 (GLOVE) ×2 IMPLANT
GLOVE BIOGEL PI INDICATOR 7.0 (GLOVE) ×6
GLOVE BIOGEL PI INDICATOR 8 (GLOVE) ×2
GLOVE ECLIPSE 6.5 STRL STRAW (GLOVE) ×3 IMPLANT
GOWN STRL REUS W/ TWL LRG LVL3 (GOWN DISPOSABLE) ×4 IMPLANT
GOWN STRL REUS W/ TWL XL LVL3 (GOWN DISPOSABLE) ×2 IMPLANT
GOWN STRL REUS W/TWL LRG LVL3 (GOWN DISPOSABLE) ×8
GOWN STRL REUS W/TWL XL LVL3 (GOWN DISPOSABLE) ×4
IV NS IRRIG 3000ML ARTHROMATIC (IV SOLUTION) ×8 IMPLANT
KIT STR SPEAR 1.8 FBRTK DISP (KITS) IMPLANT
LASSO CRESCENT QUICKPASS (SUTURE) IMPLANT
MANIFOLD NEPTUNE II (INSTRUMENTS) ×4 IMPLANT
NDL SCORPION MULTI FIRE (NEEDLE) IMPLANT
NEEDLE SCORPION MULTI FIRE (NEEDLE) ×4 IMPLANT
NS IRRIG 1000ML POUR BTL (IV SOLUTION) IMPLANT
PACK ARTHROSCOPY DSU (CUSTOM PROCEDURE TRAY) ×4 IMPLANT
PACK BASIN DAY SURGERY FS (CUSTOM PROCEDURE TRAY) ×4 IMPLANT
PENCIL SMOKE EVACUATOR (MISCELLANEOUS) IMPLANT
PROBE BIPOLAR ATHRO 135MM 90D (MISCELLANEOUS) ×4 IMPLANT
RESTRAINT HEAD UNIVERSAL NS (MISCELLANEOUS) ×4 IMPLANT
SLEEVE SCD COMPRESS KNEE MED (MISCELLANEOUS) ×4 IMPLANT
SLING ARM FOAM STRAP LRG (SOFTGOODS) ×3 IMPLANT
SPONGE LAP 4X18 RFD (DISPOSABLE) IMPLANT
SUCTION FRAZIER HANDLE 10FR (MISCELLANEOUS)
SUCTION TUBE FRAZIER 10FR DISP (MISCELLANEOUS) IMPLANT
SUPPORT WRAP ARM LG (MISCELLANEOUS) ×3 IMPLANT
SUT ETHILON 3 0 PS 1 (SUTURE) ×4 IMPLANT
SUT FIBERWIRE #2 38 T-5 BLUE (SUTURE)
SUT PDS AB 0 CT 36 (SUTURE) IMPLANT
SUT PDS AB 1 CT  36 (SUTURE)
SUT PDS AB 1 CT 36 (SUTURE) IMPLANT
SUT TIGER TAPE 7 IN WHITE (SUTURE) IMPLANT
SUT VIC AB 3-0 SH 27 (SUTURE)
SUT VIC AB 3-0 SH 27X BRD (SUTURE) IMPLANT
SUTURE FIBERWR #2 38 T-5 BLUE (SUTURE) IMPLANT
SUTURE TAPE 1.3 40 TPR END (SUTURE) ×2 IMPLANT
SUTURE TAPE 1.3 FIBERLOP 20 ST (SUTURE) IMPLANT
SUTURETAPE 1.3 40 TPR END (SUTURE) ×8
SUTURETAPE 1.3 FIBERLOOP 20 ST (SUTURE)
SYR BULB 3OZ (MISCELLANEOUS) IMPLANT
TAPE FIBER 2MM 7IN #2 BLUE (SUTURE) IMPLANT
TOWEL GREEN STERILE FF (TOWEL DISPOSABLE) ×4 IMPLANT
TUBE CONNECTING 20'X1/4 (TUBING) ×1
TUBE CONNECTING 20X1/4 (TUBING) ×3 IMPLANT
TUBING ARTHROSCOPY IRRIG 16FT (MISCELLANEOUS) ×4 IMPLANT
WATER STERILE IRR 1000ML POUR (IV SOLUTION) ×1 IMPLANT
YANKAUER SUCT BULB TIP NO VENT (SUCTIONS) IMPLANT

## 2019-01-20 NOTE — Op Note (Signed)
Procedure(s): SHOULDER ARTHROSCOPY WITH ROTATOR CUFF REPAIR AND SUBACROMIAL DECOMPRESSION, BICEPS TENOTOMY Procedure Note  Roy [name] male 85 y.o. 01/20/2019   Preoperative diagnosis: #1 right shoulder traumatic rotator cuff tear #2 right shoulder severe proximal long head biceps tendinopathy #3 right shoulder impingement with unfavorable acromial anatomy  Postoperative diagnosis: Same  Procedure(s) and Anesthesia Type: #1 right shoulder arthroscopic rotator cuff repair #2 right shoulder proximal long head biceps tenotomy #3 right shoulder arthroscopic subacromial decompression  Surgeon(s) and Role:    Roy Ade, MD - Primary     Surgeon: Roy [name]   Assistants: Roy Hubert PA-C (Danielle was present and scrubbed throughout the procedure and was essential in positioning, assisting with the camera and instrumentation,, and closure)  Anesthesia: General endotracheal anesthesia with preoperative interscalene block given by the attending anesthesiologist    Procedure Detail  SHOULDER ARTHROSCOPY WITH ROTATOR CUFF REPAIR AND SUBACROMIAL DECOMPRESSION, BICEPS TENOTOMY  Estimated Blood Loss: Min         Drains: none  Blood Given: none         Specimens: none        Complications:  * No complications entered in OR log *         Disposition: PACU - hemodynamically stable.         Condition: stable    Procedure:   INDICATIONS FOR SURGERY: The patient is 85 y.o. male who was injured by falling tree branch with subsequent pain and weakness in his arm.  He was found to have an acute traumatic rotator cuff tear.  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  He wished to proceed with arthroscopic rotator cuff repair to try and restore function and decrease pain.  OPERATIVE FINDINGS: Examination under anesthesia: He had stiffness limited to about 90 degrees forward flexion which was relieved with gentle lysis of adhesions with  manipulation under anesthesia.   DESCRIPTION OF PROCEDURE: The patient was identified in preoperative  holding area where I personally marked the operative site after  verifying site, side, and procedure with the patient. An interscalene block was given by the attending anesthesiologist the holding area.  The patient was taken back to the operating room where general anesthesia was induced without complication and was placed in the beach-chair position with the back  elevated about 60 degrees and all extremities and head and neck carefully padded and  positioned.   The right upper extremity was then prepped and  draped in a standard sterile fashion. The appropriate time-out  procedure was carried out. The patient did receive IV antibiotics  within 30 minutes of incision.   A small posterior portal incision was made and the arthroscope was introduced into the joint. An anterior portal was then established above the subscapularis using needle localization. Small cannula was placed anteriorly. Diagnostic arthroscopy was then carried out   Subscapularis was noted to be intact.  The superior labrum was frayed but the long head biceps was pulled into the joint and had severe tendinopathy involving greater than 50% of the thickness of the tendon with significant partial tearing.  This was felt to contribute to the patient's pain and therefore through a small anterior incision curved Mayo scissors were used to release the proximal long head biceps from the superior labrum and it was allowed to retract from the joint.  Glenohumeral joint surfaces were intact without significant chondromalacia.  There was some mild degenerative labral tearing which was debrided.  The  supraspinatus was noted to have full-thickness tearing anteriorly with minimal retraction.  The infraspinatus was intact.  The arthroscope was then introduced into the subacromial space a standard lateral portal was established with needle  localization. The shaver was used through the lateral portal to perform extensive bursectomy.   The bursal surface of the rotator cuff tear was identified.  This was full-thickness with minimal retraction.  Tendon quality was overall good and repair was felt to be indicated.  The tuberosity was debrided down to a bleeding surface.  A lateral cannula was placed.  A posterior lateral viewing portal was established.  Repair was then carried out by placing a 4.75 peek swivel lock anchor just off the articular margin centered in the tear.  The 4 suture strands were passed evenly throughout the tear and then brought over to an additional 4.75 peek swivel lock anchor bringing the tendon nicely down over the prepared tuberosity.  The repair was felt to be complete and watertight.  The coracoacromial ligament was taken down off the anterior acromion with the ArthroCare exposing a large hooked anterior acromial spur. A high-speed bur was then used through the lateral portal to take down the anterior acromial spur from lateral to medial in a standard acromioplasty.  The acromioplasty was also viewed from the lateral portal and the bur was used as necessary to ensure that the acromion was completely flat from posterior to anterior.  The arthroscopic equipment was removed from the joint and the portals were closed with 3-0 nylon in an interrupted fashion. Sterile dressings were then applied including Xeroform 4 x 4's ABDs and tape. The patient was then allowed to awaken from general anesthesia, placed in a sling, transferred to the stretcher and taken to the recovery room in stable condition.   POSTOPERATIVE PLAN: The patient will be discharged home today and will followup in one week for suture removal and wound check.

## 2019-01-20 NOTE — Telephone Encounter (Signed)
Can you call and see if you can get in sooner?

## 2019-01-20 NOTE — Transfer of Care (Signed)
Immediate Anesthesia Transfer of Care Note  Patient: Roy [name]  Procedure(s) Performed: SHOULDER ARTHROSCOPY WITH ROTATOR CUFF REPAIR AND SUBACROMIAL DECOMPRESSION, BICEPS TENOTOMY (Right Shoulder)  Patient Location: PACU  Anesthesia Type:GA combined with regional for post-op pain  Level of Consciousness: drowsy and patient cooperative  Airway & Oxygen Therapy: Patient Spontanous Breathing and Patient connected to face mask oxygen  Post-op Assessment: Report given to RN and Post -op Vital signs reviewed and stable  Post vital signs: Reviewed and stable  Last Vitals:  Vitals Value Taken Time  BP 91/51 01/20/19 0830  Temp    Pulse 51 01/20/19 0832  Resp 15 01/20/19 0832  SpO2 100 % 01/20/19 0832  Vitals shown include unvalidated device data.  Last Pain:  Vitals:   01/20/19 0637  TempSrc: Oral  PainSc: 5       Patients Stated Pain Goal: 3 (25/83/46 2194)  Complications: No apparent anesthesia complications

## 2019-01-20 NOTE — H&P (Signed)
Roy [name] is an 85 y.o. male.   Chief Complaint: R shoulder pain and weakness  HPI: s/p injury with R shoulder pain and weakness.  Acute full thickness RCT by MRI.  Past Medical History:  Diagnosis Date  . Anginal pain ([place])   . Arthritis    spine  . Asthma   . CAD (coronary artery disease)    s/p PCI of the D1 with cath in 2013 showing nonobstructive disease.  CP presumed due to small vessel disease.  [name] CAP (community acquired pneumonia) 12/26/2011  . Chronic kidney disease    renal insufficiency  . COPD (chronic obstructive pulmonary disease) ([place])   . Diabetes mellitus, type 2 ([place])   . Fever 12/26/2011  . GERD (gastroesophageal reflux disease)   . Heart murmur   . Hyperlipidemia   . Hypertension   . Lyme disease    states had shakes that were thought to be parkinson's related but related to tick bite, states also RMSF  . Myocardial infarction ([place])   . Right rotator cuff tear   . Shortness of breath   . Sleep apnea    intolerant to PAP    Past Surgical History:  Procedure Laterality Date  . APPENDECTOMY    . CARDIAC CATHETERIZATION  2013  . CERVICAL LAMINECTOMY    . CHOLECYSTECTOMY    . ESOPHAGOGASTRODUODENOSCOPY N/A 04/04/2018   Procedure: ESOPHAGOGASTRODUODENOSCOPY (EGD);  Surgeon: Lin Landsman, MD;  Location: [facility] ENDOSCOPY;  Service: Gastroenterology;  Laterality: N/A;  . KIDNEY STONE SURGERY    . LEFT HEART CATH N/A 02/15/2012   Procedure: LEFT HEART CATH;  Surgeon: Larey Dresser, MD;  Location: [facility] CATH LAB;  Service: Cardiovascular;  Laterality: N/A;    Family History  Problem Relation Age of Onset  . ALS Father   . Asthma Daughter   . Cancer Sister        breast/liver   Social History:  reports that he quit smoking about 60 years ago. His smoking use included cigarettes. He has a 12.00 pack-year smoking history. He has never used smokeless tobacco. He reports that he does not drink alcohol or use drugs.  Allergies:  Allergies  Allergen  Reactions  . Codeine Sulfate Other (See Comments)    Chest pain  . Crestor [Rosuvastatin Calcium]   . Cephalexin Rash    Medications Prior to Admission  Medication Sig Dispense Refill  . acetaminophen (TYLENOL) 325 MG tablet Take 650 mg by mouth every evening. Reported on 06/28/2015    . amLODipine (NORVASC) 2.5 MG tablet Take 1 tablet by mouth once daily 90 tablet 1  . aspirin EC 81 MG tablet Take 1 tablet (81 mg total) by mouth daily.    . ASSURE COMFORT LANCETS 30G MISC 1 each by Other route daily.    [name] atorvastatin (LIPITOR) 10 MG tablet Take 1 tablet by mouth once daily 90 tablet 0  . calcium-vitamin D (OSCAL WITH D) 500-200 MG-UNIT per tablet Take 1 tablet by mouth daily.    [name] glimepiride (AMARYL) 4 MG tablet TAKE 1 & 1/2 (ONE & ONE-HALF) TABLETS BY MOUTH ONCE DAILY (Patient taking differently: 4 mg. One daily) 135 tablet 0  . glucose blood (ACCU-CHEK AVIVA PLUS) test strip Test blood glucose daily. 100 each 12  . glucose blood test strip One Touch Ultra Test Strips 100 each 12  . guaiFENesin (MUCINEX) 600 MG 12 hr tablet Take by mouth 2 (two) times daily.    . isosorbide mononitrate (IMDUR) 30  MG 24 hr tablet Take 3 tablets by mouth once daily 270 tablet 0  . lisinopril (ZESTRIL) 5 MG tablet Take 1/2 (one-half) tablet by mouth once daily 45 tablet 1  . metFORMIN (GLUCOPHAGE) 500 MG tablet TAKE 1/2 (ONE-HALF) TABLET BY MOUTH ONCE DAILY WITH BREAKFAST 45 tablet 0  . metoprolol succinate (TOPROL-XL) 50 MG 24 hr tablet TAKE 1 TABLET BY MOUTH ONCE DAILY. TAKE WITH OR IMMEDIATELY FOLLOWING A MEAL. 90 tablet 0  . Omega-3 Fatty Acids (FISH OIL PO) Take 2 capsules by mouth 2 (two) times daily.    [name] omeprazole (PRILOSEC) 20 MG capsule Take 1 capsule by mouth twice daily 180 capsule 1  . OVER THE COUNTER MEDICATION Take 1 tablet by mouth at bedtime. Legatrim PM    . Polyethyl Glycol-Propyl Glycol (SYSTANE OP) Place 1 drop into both eyes 4 (four) times daily. Itchy/dry eye    . potassium citrate  (UROCIT-K) 10 MEQ (1080 MG) SR tablet Take 10 mEq by mouth Twice daily.    . ranolazine (RANEXA) 500 MG 12 hr tablet Take 1 tablet (500 mg total) by mouth 2 (two) times daily. Please keep upcoming appt for future refills. 180 tablet 3  . SYMBICORT 160-4.5 MCG/ACT inhaler INHALE 2 PUFFS BY MOUTH TWICE DAILY. 11 g 5  . traMADol (ULTRAM) 50 MG tablet TAKE 1 TABLET BY MOUTH EVERY 6 HOURS AS NEEDED FOR MODERATE PAIN. (TWICE PER DAY MAXIMUM) 60 tablet 2  . traZODone (DESYREL) 150 MG tablet Take 1 tablet by mouth once daily 90 tablet 1  . zinc sulfate 220 MG capsule Take 220 mg by mouth daily.    [name] albuterol (PROVENTIL HFA;VENTOLIN HFA) 108 (90 Base) MCG/ACT inhaler Inhale 2 puffs into the lungs every 4 (four) hours as needed for wheezing or shortness of breath. 1 Inhaler 10  . nitroGLYCERIN (NITROSTAT) 0.4 MG SL tablet Place 1 tablet (0.4 mg total) under the tongue every 5 (five) minutes as needed for chest pain. 25 tablet 1    Results for orders placed or performed during the hospital encounter of 01/20/19 (from the past 48 hour(s))  Glucose, capillary     Status: Abnormal   Collection Time: 01/20/19  6:47 AM  Result Value Ref Range   Glucose-Capillary 145 (H) 70 - 99 mg/dL   No results found.  Review of Systems  All other systems reviewed and are negative.   Blood pressure (!) 143/76, pulse (!) 56, temperature 97.9 F (36.6 C), temperature source Oral, resp. rate 13, height 5\' 9"  (1.753 m), weight 83.6 kg, SpO2 100 %. Physical Exam  Constitutional: He is oriented to person, place, and time. He appears well-developed and well-nourished.  HENT:  Head: Atraumatic.  Eyes: EOM are normal.  Cardiovascular: Intact distal pulses.  Respiratory: Effort normal.  Musculoskeletal:     Comments: R shoulder pain and weakness with rotator cuff testing.  Neurological: He is alert and oriented to person, place, and time.  Skin: Skin is warm and dry.  Psychiatric: He has a normal mood and affect.      Assessment/Plan s/p injury with R shoulder pain and weakness.  Acute full thickness RCT by MRI. Plan R arth RCR, biceps tenotomy. Risks / benefits of surgery discussed Consent on chart  NPO for OR Preop antibiotics   [name], MD 01/20/2019, 6:57 AM

## 2019-01-20 NOTE — Progress Notes (Signed)
Assisted Dr. Ambrose Pancoast with right, ultrasound guided, interscalene  block. Side rails up, monitors on throughout procedure. See vital signs in flow sheet. Tolerated Procedure well.

## 2019-01-20 NOTE — Discharge Instructions (Signed)
Discharge Instructions after Arthroscopic Shoulder Repair   A sling has been provided for you. Remain in your sling at all times. This includes sleeping in your sling.  Use ice on the shoulder intermittently over the first 48 hours after surgery.  Pain medicine has been prescribed for you.  Use your medicine liberally over the first 48 hours, and then you can begin to taper your use. You may take Extra Strength Tylenol or Tylenol only in place of the pain pills. DO NOT take ANY nonsteroidal anti-inflammatory pain medications: Advil, Motrin, Ibuprofen, Aleve, Naproxen, or Narprosyn.  You may remove your dressing after two days. If the incision sites are still moist, place a Band-Aid over the moist site(s). Change Band-Aids daily until dry.  You may shower 5 days after surgery. The incisions CANNOT get wet prior to 5 days. Simply allow the water to wash over the site and then pat dry. Do not rub the incisions. Make sure your axilla (armpit) is completely dry after showering.  Take one aspirin a day for 2 weeks after surgery, unless you have an aspirin sensitivity/ allergy or asthma.   Please call [phone] during normal business hours or [phone] after hours for any problems. Including the following:  - excessive redness of the incisions - drainage for more than 4 days - fever of more than 101.5 F  *Please note that pain medications will not be refilled after hours or on weekends.    Post Anesthesia Home Care Instructions  Activity: Get plenty of rest for the remainder of the day. A responsible individual must stay with you for 24 hours following the procedure.  For the next 24 hours, DO NOT: -Drive a car -Paediatric nurse -Drink alcoholic beverages -Take any medication unless instructed by your physician -Make any legal decisions or sign important papers.  Meals: Start with liquid foods such as gelatin or soup. Progress to regular foods as tolerated. Avoid greasy, spicy,  heavy foods. If nausea and/or vomiting occur, drink only clear liquids until the nausea and/or vomiting subsides. Call your physician if vomiting continues.  Special Instructions/Symptoms: Your throat may feel dry or sore from the anesthesia or the breathing tube placed in your throat during surgery. If this causes discomfort, gargle with warm salt water. The discomfort should disappear within 24 hours.  If you had a scopolamine patch placed behind your ear for the management of post- operative nausea and/or vomiting:  1. The medication in the patch is effective for 72 hours, after which it should be removed.  Wrap patch in a tissue and discard in the trash. Wash hands thoroughly with soap and water. 2. You may remove the patch earlier than 72 hours if you experience unpleasant side effects which may include dry mouth, dizziness or visual disturbances. 3. Avoid touching the patch. Wash your hands with soap and water after contact with the patch.  Regional Anesthesia Blocks  1. Numbness or the inability to move the "blocked" extremity may last from 3-48 hours after placement. The length of time depends on the medication injected and your individual response to the medication. If the numbness is not going away after 48 hours, call your surgeon.  2. The extremity that is blocked will need to be protected until the numbness is gone and the  Strength has returned. Because you cannot feel it, you will need to take extra care to avoid injury. Because it may be weak, you may have difficulty moving it or using it. You may not know  what position it is in without looking at it while the block is in effect.  3. For blocks in the legs and feet, returning to weight bearing and walking needs to be done carefully. You will need to wait until the numbness is entirely gone and the strength has returned. You should be able to move your leg and foot normally before you try and bear weight or walk. You will need someone  to be with you when you first try to ensure you do not fall and possibly risk injury.  4. Bruising and tenderness at the needle site are common side effects and will resolve in a few days.  5. Persistent numbness or new problems with movement should be communicated to the surgeon or the [place] [phone] [place] [phone]).

## 2019-01-20 NOTE — Telephone Encounter (Signed)
Please advise if patient can be worked in sooner.   Copied from [place] [id number]. Topic: Appointment Scheduling - Scheduling Inquiry for Clinic >> Jan 20, 2019 12:22 PM Rainey Pines A wrote: Patients wife called to schedule patient hospital follow up for a week out with Dr. Yong Channel. Next available in January  please advise

## 2019-01-20 NOTE — Anesthesia Procedure Notes (Signed)
Procedure Name: Intubation Date/Time: 01/20/2019 7:25 AM Performed by: Signe Colt, CRNA Pre-anesthesia Checklist: Patient identified, Emergency Drugs available, Suction available and Patient being monitored Patient Re-evaluated:Patient Re-evaluated prior to induction Oxygen Delivery Method: Circle system utilized Preoxygenation: Pre-oxygenation with 100% oxygen Induction Type: IV induction Ventilation: Mask ventilation without difficulty Laryngoscope Size: Glidescope Grade View: Grade I Tube type: Oral Tube size: 7.0 mm Number of attempts: 1 Airway Equipment and Method: Stylet and Oral airway Placement Confirmation: ETT inserted through vocal cords under direct vision,  positive ETCO2 and breath sounds checked- equal and bilateral Secured at: 22 cm Tube secured with: Tape Dental Injury: Teeth and Oropharynx as per pre-operative assessment

## 2019-01-20 NOTE — Anesthesia Procedure Notes (Signed)
Anesthesia Regional Block: Interscalene brachial plexus block   Pre-Anesthetic Checklist: ,, timeout performed, Correct Patient, Correct Site, Correct Laterality, Correct Procedure, Correct Position, site marked, Risks and benefits discussed,  Surgical consent,  Pre-op evaluation,  At surgeon's request and post-op pain management  Laterality: Right  Prep: chloraprep       Needles:  Injection technique: Single-shot  Needle Type: Echogenic Stimulator Needle     Needle Length: 5cm  Needle Gauge: 22     Additional Needles:   Procedures:, nerve stimulator,,, ultrasound used (permanent image in chart),,,,   Nerve Stimulator or Paresthesia:  Response: hand, 0.45 mA,   Additional Responses:   Narrative:  Start time: 01/20/2019 7:00 AM End time: 01/20/2019 7:05 AM Injection made incrementally with aspirations every 5 mL.  Performed by: Personally  Anesthesiologist: Janeece Riggers, MD  Additional Notes: Functioning IV was confirmed and monitors were applied.  A 72mm 22ga Arrow echogenic stimulator needle was used. Sterile prep and drape,hand hygiene and sterile gloves were used. Ultrasound guidance: relevant anatomy identified, needle position confirmed, local anesthetic spread visualized around nerve(s)., vascular puncture avoided.  Image printed for medical record. Negative aspiration and negative test dose prior to incremental administration of local anesthetic. The patient tolerated the procedure well.

## 2019-01-20 NOTE — Anesthesia Postprocedure Evaluation (Signed)
Anesthesia Post Note  Patient: Roy [name]  Procedure(s) Performed: SHOULDER ARTHROSCOPY WITH ROTATOR CUFF REPAIR AND SUBACROMIAL DECOMPRESSION, BICEPS TENOTOMY (Right Shoulder)     Patient location during evaluation: PACU Anesthesia Type: General Level of consciousness: awake and alert Pain management: pain level controlled Vital Signs Assessment: post-procedure vital signs reviewed and stable Respiratory status: spontaneous breathing, nonlabored ventilation, respiratory function stable and patient connected to nasal cannula oxygen Cardiovascular status: blood pressure returned to baseline and stable Postop Assessment: no apparent nausea or vomiting Anesthetic complications: no    Last Vitals:  Vitals:   01/20/19 0915 01/20/19 0930  BP: 107/64 117/66  Pulse: (!) 52 (!) 52  Resp: 16 16  Temp:  36.5 C  SpO2: 96% 98%    Last Pain:  Vitals:   01/20/19 0930  TempSrc:   PainSc: 0-No pain                 Tri Chittick

## 2019-01-21 ENCOUNTER — Encounter (HOSPITAL_BASED_OUTPATIENT_CLINIC_OR_DEPARTMENT_OTHER): Payer: Self-pay | Admitting: Orthopedic Surgery

## 2019-01-27 ENCOUNTER — Encounter: Payer: Self-pay | Admitting: Family Medicine

## 2019-01-27 ENCOUNTER — Inpatient Hospital Stay: Payer: PPO | Admitting: Family Medicine

## 2019-01-27 ENCOUNTER — Other Ambulatory Visit: Payer: Self-pay

## 2019-01-27 ENCOUNTER — Ambulatory Visit (INDEPENDENT_AMBULATORY_CARE_PROVIDER_SITE_OTHER): Payer: PPO | Admitting: Family Medicine

## 2019-01-27 VITALS — BP 138/58 | HR 58 | Temp 98.5°F | Ht 69.5 in | Wt 182.0 lb

## 2019-01-27 DIAGNOSIS — I25119 Atherosclerotic heart disease of native coronary artery with unspecified angina pectoris: Secondary | ICD-10-CM

## 2019-01-27 DIAGNOSIS — I152 Hypertension secondary to endocrine disorders: Secondary | ICD-10-CM

## 2019-01-27 DIAGNOSIS — M79644 Pain in right finger(s): Secondary | ICD-10-CM | POA: Diagnosis not present

## 2019-01-27 DIAGNOSIS — J4530 Mild persistent asthma, uncomplicated: Secondary | ICD-10-CM

## 2019-01-27 DIAGNOSIS — D696 Thrombocytopenia, unspecified: Secondary | ICD-10-CM

## 2019-01-27 DIAGNOSIS — E1121 Type 2 diabetes mellitus with diabetic nephropathy: Secondary | ICD-10-CM | POA: Diagnosis not present

## 2019-01-27 DIAGNOSIS — E1169 Type 2 diabetes mellitus with other specified complication: Secondary | ICD-10-CM | POA: Diagnosis not present

## 2019-01-27 DIAGNOSIS — E785 Hyperlipidemia, unspecified: Secondary | ICD-10-CM | POA: Diagnosis not present

## 2019-01-27 DIAGNOSIS — N183 Chronic kidney disease, stage 3 unspecified: Secondary | ICD-10-CM | POA: Diagnosis not present

## 2019-01-27 DIAGNOSIS — E1159 Type 2 diabetes mellitus with other circulatory complications: Secondary | ICD-10-CM | POA: Diagnosis not present

## 2019-01-27 DIAGNOSIS — I10 Essential (primary) hypertension: Secondary | ICD-10-CM | POA: Diagnosis not present

## 2019-01-27 LAB — CBC WITH DIFFERENTIAL/PLATELET
Basophils Absolute: 0.1 10*3/uL (ref 0.0–0.1)
Basophils Relative: 0.7 % (ref 0.0–3.0)
Eosinophils Absolute: 1.2 10*3/uL — ABNORMAL HIGH (ref 0.0–0.7)
Eosinophils Relative: 13.3 % — ABNORMAL HIGH (ref 0.0–5.0)
HCT: 37.7 % — ABNORMAL LOW (ref 39.0–52.0)
Hemoglobin: 12.6 g/dL — ABNORMAL LOW (ref 13.0–17.0)
Lymphocytes Relative: 16.9 % (ref 12.0–46.0)
Lymphs Abs: 1.5 10*3/uL (ref 0.7–4.0)
MCHC: 33.5 g/dL (ref 30.0–36.0)
MCV: 86.4 fl (ref 78.0–100.0)
Monocytes Absolute: 0.9 10*3/uL (ref 0.1–1.0)
Monocytes Relative: 9.7 % (ref 3.0–12.0)
Neutro Abs: 5.4 10*3/uL (ref 1.4–7.7)
Neutrophils Relative %: 59.4 % (ref 43.0–77.0)
Platelets: 184 10*3/uL (ref 150.0–400.0)
RBC: 4.37 Mil/uL (ref 4.22–5.81)
RDW: 14 % (ref 11.5–15.5)
WBC: 9.1 10*3/uL (ref 4.0–10.5)

## 2019-01-27 LAB — COMPREHENSIVE METABOLIC PANEL
ALT: 8 U/L (ref 0–53)
AST: 9 U/L (ref 0–37)
Albumin: 3.7 g/dL (ref 3.5–5.2)
Alkaline Phosphatase: 75 U/L (ref 39–117)
BUN: 35 mg/dL — ABNORMAL HIGH (ref 6–23)
CO2: 29 mEq/L (ref 19–32)
Calcium: 9.2 mg/dL (ref 8.4–10.5)
Chloride: 100 mEq/L (ref 96–112)
Creatinine, Ser: 1.8 mg/dL — ABNORMAL HIGH (ref 0.40–1.50)
GFR: 36.11 mL/min — ABNORMAL LOW (ref >60.00)
Glucose, Bld: 174 mg/dL — ABNORMAL HIGH (ref 70–99)
Potassium: 4.4 mEq/L (ref 3.5–5.1)
Sodium: 137 mEq/L (ref 135–145)
Total Bilirubin: 0.6 mg/dL (ref 0.2–1.2)
Total Protein: 6.8 g/dL (ref 6.0–8.3)

## 2019-01-27 NOTE — Assessment & Plan Note (Signed)
#  Thrombocytopenia-patient saw Hematology 01/13/2019 said no further work-up and released patient to as needed.  They state slightly low platelets largely stable since 2009-mild dip did not need to prohibit surgery.  Possibility of ITP versus early MDS but only 1 and referral back if significantly worsening.  Eosinophilia thought related to allergies or medications-they did not suggest further myeloproliferative work-up.

## 2019-01-27 NOTE — Patient Instructions (Addendum)
Health Maintenance Due  Topic Date Due  . OPHTHALMOLOGY EXAM Patient will call to make app  01/14/2019   - we discussed until he sees orthopedics tomorrow that he can use 2 tramadol this afternoon and then 6 hours later can repeat the 2 tramadol, then can take 2 tomorrow morning and at least 6 hours later can repeat -also use voltaren gel 4x a day.   Please stop by lab before you go If you do not have mychart- we will call you about results within 5 business days of [place] receiving them.  If you have mychart- we will send your results within 3 business days of [place] receiving them.  If abnormal or we want to clarify a result, we will call or mychart you to make sure you receive the message.  If you have questions or concerns or don't hear within 5-7 days, please send [place] a message or call us.   Recommended follow up: Return for next already scheduled visit. planned February visit- if you want to push this out for a few months like April or may due to covid im ok with that as well

## 2019-01-27 NOTE — Progress Notes (Signed)
Phone [phone] In person visit   Subjective:   Roy [name] is a 85 y.o. year old very pleasant male patient who presents for/with See problem oriented charting Chief Complaint  Patient presents with  . Hospitalization Follow-up    right arm pain   ROS- Review of Systems  Constitutional: Negative.   HENT: Negative.   Eyes: Negative.   Respiratory: Positive for shortness of breath (for first few days after surgery- now improved after using albuterol) and wheezing. Negative for cough.   Cardiovascular: Negative.   Gastrointestinal: Negative.   Genitourinary: Negative.   Musculoskeletal: Positive for neck pain (c2 and arthritis fracture and neck pain).  Skin: Negative.   Neurological: Negative.   Endo/Heme/Allergies: Bruises/bleeds easily.  Psychiatric/Behavioral: Negative.     This visit occurred during the SARS-CoV-2 public health emergency.  Safety protocols were in place, including screening questions prior to the visit, additional usage of staff PPE, and extensive cleaning of exam room while observing appropriate contact time as indicated for disinfecting solutions.   Past Medical History-  Patient Active Problem List   Diagnosis Date Noted  . Type II diabetes mellitus with renal manifestations ([place]) 09/11/2006    Priority: High  . Coronary artery disease with angina pectoris ([place]) 09/11/2006    Priority: High  . Thrombocytopenia ([place]) 02/23/2015    Priority: Medium  . Mild persistent asthma 10/30/2010    Priority: Medium  . CKD (chronic kidney disease), stage III 02/22/2009    Priority: Medium  . Hyperlipidemia associated with type 2 diabetes mellitus ([place]) 09/11/2006    Priority: Medium  . Hypertension associated with diabetes ([place]) 09/11/2006    Priority: Medium  . Back pain 09/11/2006    Priority: Medium  . Sleep apnea 09/11/2006    Priority: Medium  . Senile purpura ([place]) 08/08/2017    Priority: Low  . GERD (gastroesophageal reflux disease) 05/03/2014     Priority: Low  . Insomnia 05/03/2014    Priority: Low  . Allergic rhinitis 04/21/2014    Priority: Low  . Giardia 04/21/2014    Priority: Low  . NEPHROLITHIASIS, RECURRENT 01/21/2009    Priority: Low  . ACTINIC KERATOSIS, HEAD 06/14/2008    Priority: Low  . Right rotator cuff tear 12/11/2018  . Calcific tendinitis of right shoulder 09/03/2018  . AC (acromioclavicular) arthritis 09/03/2018  . Pain of right thumb 04/08/2018  . Food impaction of esophagus 04/03/2018    Medications- reviewed and updated Current Outpatient Medications  Medication Sig Dispense Refill  . acetaminophen (TYLENOL) 325 MG tablet Take 650 mg by mouth every evening. Reported on 06/28/2015    . albuterol (PROVENTIL HFA;VENTOLIN HFA) 108 (90 Base) MCG/ACT inhaler Inhale 2 puffs into the lungs every 4 (four) hours as needed for wheezing or shortness of breath. 1 Inhaler 10  . amLODipine (NORVASC) 2.5 MG tablet Take 1 tablet by mouth once daily 90 tablet 1  . aspirin EC 81 MG tablet Take 1 tablet (81 mg total) by mouth daily.    . ASSURE COMFORT LANCETS 30G MISC 1 each by Other route daily.    [name] atorvastatin (LIPITOR) 10 MG tablet Take 1 tablet by mouth once daily 90 tablet 0  . calcium-vitamin D (OSCAL WITH D) 500-200 MG-UNIT per tablet Take 1 tablet by mouth daily.    [name] glimepiride (AMARYL) 4 MG tablet TAKE 1 & 1/2 (ONE & ONE-HALF) TABLETS BY MOUTH ONCE DAILY (Patient taking differently: 4 mg. One daily) 135 tablet 0  . glucose blood (ACCU-CHEK AVIVA  PLUS) test strip Test blood glucose daily. 100 each 12  . glucose blood test strip One Touch Ultra Test Strips 100 each 12  . guaiFENesin (MUCINEX) 600 MG 12 hr tablet Take by mouth 2 (two) times daily.    [name] HYDROcodone-acetaminophen (NORCO) 5-325 MG tablet Take 1 tablet by mouth every 6 (six) hours as needed for moderate pain. 20 tablet 0  . isosorbide mononitrate (IMDUR) 30 MG 24 hr tablet Take 3 tablets by mouth once daily 270 tablet 0  . lisinopril (ZESTRIL) 5  MG tablet Take 1/2 (one-half) tablet by mouth once daily 45 tablet 1  . metFORMIN (GLUCOPHAGE) 500 MG tablet TAKE 1/2 (ONE-HALF) TABLET BY MOUTH ONCE DAILY WITH BREAKFAST 45 tablet 0  . metoprolol succinate (TOPROL-XL) 50 MG 24 hr tablet TAKE 1 TABLET BY MOUTH ONCE DAILY. TAKE WITH OR IMMEDIATELY FOLLOWING A MEAL. 90 tablet 0  . nitroGLYCERIN (NITROSTAT) 0.4 MG SL tablet Place 1 tablet (0.4 mg total) under the tongue every 5 (five) minutes as needed for chest pain. 25 tablet 1  . Omega-3 Fatty Acids (FISH OIL PO) Take 2 capsules by mouth 2 (two) times daily.    [name] omeprazole (PRILOSEC) 20 MG capsule Take 1 capsule by mouth twice daily 180 capsule 1  . OVER THE COUNTER MEDICATION Take 1 tablet by mouth at bedtime. Legatrim PM    . Polyethyl Glycol-Propyl Glycol (SYSTANE OP) Place 1 drop into both eyes 4 (four) times daily. Itchy/dry eye    . potassium citrate (UROCIT-K) 10 MEQ (1080 MG) SR tablet Take 10 mEq by mouth Twice daily.    . ranolazine (RANEXA) 500 MG 12 hr tablet Take 1 tablet (500 mg total) by mouth 2 (two) times daily. Please keep upcoming appt for future refills. 180 tablet 3  . SYMBICORT 160-4.5 MCG/ACT inhaler INHALE 2 PUFFS BY MOUTH TWICE DAILY. 11 g 5  . traMADol (ULTRAM) 50 MG tablet TAKE 1 TABLET BY MOUTH EVERY 6 HOURS AS NEEDED FOR MODERATE PAIN. (TWICE PER DAY MAXIMUM) 60 tablet 2  . traZODone (DESYREL) 150 MG tablet Take 1 tablet by mouth once daily 90 tablet 1  . zinc sulfate 220 MG capsule Take 220 mg by mouth daily.     No current facility-administered medications for this visit.      Objective:  BP (!) 138/58 (BP Location: Left Arm, Patient Position: Sitting, Cuff Size: Normal)   Pulse (!) 58   Temp 98.5 F (36.9 C) (Temporal)   Ht 5' 9.5" (1.765 m)   Wt 182 lb (82.6 kg)   SpO2 93%   BMI 26.49 kg/m  Gen: NAD, resting comfortably CV: RRR no murmurs rubs or gallops Lungs: CTAB no crackles, wheeze, rhonchi Abdomen: soft/nontender/nondistended/normal bowel sounds.  No rebound or guarding.  Ext: no edema Skin: warm, dry MSK: Some tenderness right first MCP joint but states has had worse pain there as well as at [facility] joint    Assessment and Plan   #Hospital follow-up-patient was hospitalized on November 17 and discharged on 01/20/2019.  He had surgery for a full-thickness rotator cuff tear by MRI due to ongoing right shoulder pain and weakness.  Since the surgery, patient states right thumb actually hurt worse than his shoulder. Has started to ease up in last few days. Tried voltaren gel without improvement- he is going to ask orthopedics tomorrow about potential injection.  -right shoulder pain actually ok at moment but right thumb pain really bothering him (though improving) - we discussed until he sees  orthopedics tomorrow that he can use 2 tramadol this afternoon and then 6 hours later can repeat the 2 tramadol -We thought it was best for hydrocodone and above to be through orthopedics management-suspect may have some arthritis in the right thumb-he wants to consider injections tomorrow through orthopedics or he could see Dr. Tamala Julian of sports medicine back or his colleague Dr. Georgina Snell -also schedule voltaren gel which he has at home - phq9 is slightly elevated due to pain/stress from recent surgery  #Thrombocytopenia-patient saw Hematology 01/13/2019 said no further work-up and released patient to as needed.  They state slightly low platelets largely stable since 2009-mild dip did not need to prohibit surgery.  Possibility of ITP versus early MDS but only 1 and referral back if significantly worsening.  Eosinophilia thought related to allergies or medications-they did not suggest further myeloproliferative work-up.  # Diabetes S:compliant with  Metformin 500mg  half tablet with breakfast, glimepiride 4 mg   Since surgery- has not been checking   Lab Results  Component Value Date   HGBA1C 6.7 (H) 12/30/2018   HGBA1C 7.2 (H) 09/02/2018   HGBA1C 6.8 (H)  04/17/2018   A/P: hopefully  Stable. Continue current medications.    # CAD with angina/Hyperlipidemia S:compliant with atorvastatin 10mg  and aspirin 81mg . LDL goal <70 . Asymptomatic while on ranexa and imdur.  No issues since his surgery  Lab Results  Component Value Date   CHOL 124 09/02/2018   HDL 43.70 09/02/2018   LDLCALC 60 09/02/2018   LDLDIRECT 55.0 12/30/2018   TRIG 100.0 09/02/2018   CHOLHDL 3 09/02/2018   A/P: Last LDL at goal 70 or less.  Asymptomatic from CAD perspective while on Ranexa and Imdur for angina management-continue current regimen for both issues  # Hypertension/CKD III S:compliant with  Amlodipine 2.5, imdur 90mg , lisinopril 5 mg, metoprolol 50mg  XR -on lisinopril in case proteinuric element. GFR usually 30-40 range   BP Readings from Last 3 Encounters:  01/27/19 (!) 138/58  01/20/19 117/66  01/13/19 138/60   A/P: CKD III-suspect stable-repeat CBC and CMP post surgery -Hypertension well-controlled today   # Asthma S: compliant with symbicort. Breathing was slightly worse after surgery but has improved. Used albuterol for first 2 days and that seemed to help A/P:sounds reasonably controlled- slgihtly worsened after surgery.   Recommended follow up: Return for next already scheduled visit. Future Appointments  Date Time Provider [place]  04/28/2019 10:40 AM Marin Olp, MD [facility]    Lab/Order associations:   ICD-10-CM   1. Hypertension associated with diabetes ([place])  E11.59 CBC with Differential   I10 Comprehensive metabolic panel  2. Pain of right thumb  M79.644   3. Thrombocytopenia ([place])  D69.6   4. Type 2 diabetes mellitus with diabetic nephropathy, without long-term current use of insulin (HCC)  E11.21   5. Coronary artery disease involving native coronary artery of native heart with angina pectoris ([place])  I25.119   6. Hyperlipidemia associated with type 2 diabetes mellitus (H[facility])  E11.69    E78.5   7. Stage 3 chronic  kidney disease, unspecified whether stage 3a or 3b CKD  N18.30   8. Mild persistent asthma without complication  J94.17     No orders of the defined types were placed in this encounter.   Return precautions advised.  [name], MD

## 2019-01-31 ENCOUNTER — Other Ambulatory Visit: Payer: Self-pay | Admitting: Family Medicine

## 2019-02-02 DIAGNOSIS — M75121 Complete rotator cuff tear or rupture of right shoulder, not specified as traumatic: Secondary | ICD-10-CM | POA: Diagnosis not present

## 2019-02-02 DIAGNOSIS — M25611 Stiffness of right shoulder, not elsewhere classified: Secondary | ICD-10-CM | POA: Diagnosis not present

## 2019-02-05 DIAGNOSIS — M25611 Stiffness of right shoulder, not elsewhere classified: Secondary | ICD-10-CM | POA: Diagnosis not present

## 2019-02-05 DIAGNOSIS — M75121 Complete rotator cuff tear or rupture of right shoulder, not specified as traumatic: Secondary | ICD-10-CM | POA: Diagnosis not present

## 2019-02-10 DIAGNOSIS — M25611 Stiffness of right shoulder, not elsewhere classified: Secondary | ICD-10-CM | POA: Diagnosis not present

## 2019-02-10 DIAGNOSIS — M75121 Complete rotator cuff tear or rupture of right shoulder, not specified as traumatic: Secondary | ICD-10-CM | POA: Diagnosis not present

## 2019-02-12 DIAGNOSIS — M75121 Complete rotator cuff tear or rupture of right shoulder, not specified as traumatic: Secondary | ICD-10-CM | POA: Diagnosis not present

## 2019-02-12 DIAGNOSIS — M25611 Stiffness of right shoulder, not elsewhere classified: Secondary | ICD-10-CM | POA: Diagnosis not present

## 2019-02-16 DIAGNOSIS — M25611 Stiffness of right shoulder, not elsewhere classified: Secondary | ICD-10-CM | POA: Diagnosis not present

## 2019-02-16 DIAGNOSIS — M75121 Complete rotator cuff tear or rupture of right shoulder, not specified as traumatic: Secondary | ICD-10-CM | POA: Diagnosis not present

## 2019-02-18 ENCOUNTER — Encounter: Payer: PPO | Admitting: Family Medicine

## 2019-02-18 DIAGNOSIS — M75121 Complete rotator cuff tear or rupture of right shoulder, not specified as traumatic: Secondary | ICD-10-CM | POA: Diagnosis not present

## 2019-02-18 DIAGNOSIS — M25611 Stiffness of right shoulder, not elsewhere classified: Secondary | ICD-10-CM | POA: Diagnosis not present

## 2019-02-23 DIAGNOSIS — M75121 Complete rotator cuff tear or rupture of right shoulder, not specified as traumatic: Secondary | ICD-10-CM | POA: Diagnosis not present

## 2019-02-23 DIAGNOSIS — M25611 Stiffness of right shoulder, not elsewhere classified: Secondary | ICD-10-CM | POA: Diagnosis not present

## 2019-02-25 DIAGNOSIS — M75121 Complete rotator cuff tear or rupture of right shoulder, not specified as traumatic: Secondary | ICD-10-CM | POA: Diagnosis not present

## 2019-02-25 DIAGNOSIS — M25611 Stiffness of right shoulder, not elsewhere classified: Secondary | ICD-10-CM | POA: Diagnosis not present

## 2019-03-02 DIAGNOSIS — M75121 Complete rotator cuff tear or rupture of right shoulder, not specified as traumatic: Secondary | ICD-10-CM | POA: Diagnosis not present

## 2019-03-02 DIAGNOSIS — M25611 Stiffness of right shoulder, not elsewhere classified: Secondary | ICD-10-CM | POA: Diagnosis not present

## 2019-03-04 DIAGNOSIS — M75121 Complete rotator cuff tear or rupture of right shoulder, not specified as traumatic: Secondary | ICD-10-CM | POA: Diagnosis not present

## 2019-03-04 DIAGNOSIS — M25611 Stiffness of right shoulder, not elsewhere classified: Secondary | ICD-10-CM | POA: Diagnosis not present

## 2019-03-10 ENCOUNTER — Other Ambulatory Visit: Payer: Self-pay | Admitting: Family Medicine

## 2019-03-11 DIAGNOSIS — M75121 Complete rotator cuff tear or rupture of right shoulder, not specified as traumatic: Secondary | ICD-10-CM | POA: Diagnosis not present

## 2019-03-11 DIAGNOSIS — M25611 Stiffness of right shoulder, not elsewhere classified: Secondary | ICD-10-CM | POA: Diagnosis not present

## 2019-03-13 DIAGNOSIS — M25611 Stiffness of right shoulder, not elsewhere classified: Secondary | ICD-10-CM | POA: Diagnosis not present

## 2019-03-13 DIAGNOSIS — M75121 Complete rotator cuff tear or rupture of right shoulder, not specified as traumatic: Secondary | ICD-10-CM | POA: Diagnosis not present

## 2019-03-16 DIAGNOSIS — M75121 Complete rotator cuff tear or rupture of right shoulder, not specified as traumatic: Secondary | ICD-10-CM | POA: Diagnosis not present

## 2019-03-16 DIAGNOSIS — M25611 Stiffness of right shoulder, not elsewhere classified: Secondary | ICD-10-CM | POA: Diagnosis not present

## 2019-03-18 ENCOUNTER — Other Ambulatory Visit: Payer: Self-pay | Admitting: Family Medicine

## 2019-03-18 DIAGNOSIS — N183 Chronic kidney disease, stage 3 unspecified: Secondary | ICD-10-CM

## 2019-03-18 DIAGNOSIS — M25611 Stiffness of right shoulder, not elsewhere classified: Secondary | ICD-10-CM | POA: Diagnosis not present

## 2019-03-18 DIAGNOSIS — M75121 Complete rotator cuff tear or rupture of right shoulder, not specified as traumatic: Secondary | ICD-10-CM | POA: Diagnosis not present

## 2019-03-18 DIAGNOSIS — E1122 Type 2 diabetes mellitus with diabetic chronic kidney disease: Secondary | ICD-10-CM

## 2019-03-24 DIAGNOSIS — M75121 Complete rotator cuff tear or rupture of right shoulder, not specified as traumatic: Secondary | ICD-10-CM | POA: Diagnosis not present

## 2019-03-24 DIAGNOSIS — M25611 Stiffness of right shoulder, not elsewhere classified: Secondary | ICD-10-CM | POA: Diagnosis not present

## 2019-03-26 DIAGNOSIS — M75121 Complete rotator cuff tear or rupture of right shoulder, not specified as traumatic: Secondary | ICD-10-CM | POA: Diagnosis not present

## 2019-03-26 DIAGNOSIS — M25611 Stiffness of right shoulder, not elsewhere classified: Secondary | ICD-10-CM | POA: Diagnosis not present

## 2019-03-27 ENCOUNTER — Other Ambulatory Visit: Payer: Self-pay | Admitting: Family Medicine

## 2019-03-30 DIAGNOSIS — M75121 Complete rotator cuff tear or rupture of right shoulder, not specified as traumatic: Secondary | ICD-10-CM | POA: Diagnosis not present

## 2019-03-30 DIAGNOSIS — M25611 Stiffness of right shoulder, not elsewhere classified: Secondary | ICD-10-CM | POA: Diagnosis not present

## 2019-04-01 DIAGNOSIS — M25611 Stiffness of right shoulder, not elsewhere classified: Secondary | ICD-10-CM | POA: Diagnosis not present

## 2019-04-01 DIAGNOSIS — M75121 Complete rotator cuff tear or rupture of right shoulder, not specified as traumatic: Secondary | ICD-10-CM | POA: Diagnosis not present

## 2019-04-06 DIAGNOSIS — M25611 Stiffness of right shoulder, not elsewhere classified: Secondary | ICD-10-CM | POA: Diagnosis not present

## 2019-04-06 DIAGNOSIS — M75121 Complete rotator cuff tear or rupture of right shoulder, not specified as traumatic: Secondary | ICD-10-CM | POA: Diagnosis not present

## 2019-04-08 DIAGNOSIS — M25611 Stiffness of right shoulder, not elsewhere classified: Secondary | ICD-10-CM | POA: Diagnosis not present

## 2019-04-08 DIAGNOSIS — M75121 Complete rotator cuff tear or rupture of right shoulder, not specified as traumatic: Secondary | ICD-10-CM | POA: Diagnosis not present

## 2019-04-12 ENCOUNTER — Other Ambulatory Visit: Payer: Self-pay | Admitting: Family Medicine

## 2019-04-13 DIAGNOSIS — M75121 Complete rotator cuff tear or rupture of right shoulder, not specified as traumatic: Secondary | ICD-10-CM | POA: Diagnosis not present

## 2019-04-13 DIAGNOSIS — M25611 Stiffness of right shoulder, not elsewhere classified: Secondary | ICD-10-CM | POA: Diagnosis not present

## 2019-04-20 DIAGNOSIS — M75121 Complete rotator cuff tear or rupture of right shoulder, not specified as traumatic: Secondary | ICD-10-CM | POA: Diagnosis not present

## 2019-04-20 DIAGNOSIS — M25611 Stiffness of right shoulder, not elsewhere classified: Secondary | ICD-10-CM | POA: Diagnosis not present

## 2019-04-21 ENCOUNTER — Other Ambulatory Visit: Payer: Self-pay | Admitting: Family Medicine

## 2019-04-22 DIAGNOSIS — M75121 Complete rotator cuff tear or rupture of right shoulder, not specified as traumatic: Secondary | ICD-10-CM | POA: Diagnosis not present

## 2019-04-22 DIAGNOSIS — Z9889 Other specified postprocedural states: Secondary | ICD-10-CM | POA: Diagnosis not present

## 2019-04-22 DIAGNOSIS — M25611 Stiffness of right shoulder, not elsewhere classified: Secondary | ICD-10-CM | POA: Diagnosis not present

## 2019-04-22 DIAGNOSIS — M19049 Primary osteoarthritis, unspecified hand: Secondary | ICD-10-CM | POA: Diagnosis not present

## 2019-04-27 DIAGNOSIS — M25611 Stiffness of right shoulder, not elsewhere classified: Secondary | ICD-10-CM | POA: Diagnosis not present

## 2019-04-27 DIAGNOSIS — M75121 Complete rotator cuff tear or rupture of right shoulder, not specified as traumatic: Secondary | ICD-10-CM | POA: Diagnosis not present

## 2019-04-28 ENCOUNTER — Other Ambulatory Visit: Payer: Self-pay

## 2019-04-28 ENCOUNTER — Ambulatory Visit (INDEPENDENT_AMBULATORY_CARE_PROVIDER_SITE_OTHER): Payer: PPO | Admitting: Family Medicine

## 2019-04-28 ENCOUNTER — Encounter: Payer: Self-pay | Admitting: Family Medicine

## 2019-04-28 ENCOUNTER — Encounter: Payer: PPO | Admitting: Family Medicine

## 2019-04-28 VITALS — BP 142/68 | HR 66 | Temp 97.9°F | Ht 69.5 in | Wt 186.2 lb

## 2019-04-28 DIAGNOSIS — E1159 Type 2 diabetes mellitus with other circulatory complications: Secondary | ICD-10-CM

## 2019-04-28 DIAGNOSIS — D692 Other nonthrombocytopenic purpura: Secondary | ICD-10-CM | POA: Diagnosis not present

## 2019-04-28 DIAGNOSIS — I25119 Atherosclerotic heart disease of native coronary artery with unspecified angina pectoris: Secondary | ICD-10-CM

## 2019-04-28 DIAGNOSIS — J4530 Mild persistent asthma, uncomplicated: Secondary | ICD-10-CM | POA: Diagnosis not present

## 2019-04-28 DIAGNOSIS — E1121 Type 2 diabetes mellitus with diabetic nephropathy: Secondary | ICD-10-CM

## 2019-04-28 DIAGNOSIS — I10 Essential (primary) hypertension: Secondary | ICD-10-CM | POA: Diagnosis not present

## 2019-04-28 DIAGNOSIS — D696 Thrombocytopenia, unspecified: Secondary | ICD-10-CM | POA: Diagnosis not present

## 2019-04-28 DIAGNOSIS — N183 Chronic kidney disease, stage 3 unspecified: Secondary | ICD-10-CM | POA: Diagnosis not present

## 2019-04-28 DIAGNOSIS — Z0000 Encounter for general adult medical examination without abnormal findings: Secondary | ICD-10-CM

## 2019-04-28 LAB — COMPREHENSIVE METABOLIC PANEL
ALT: 10 U/L (ref 0–53)
AST: 11 U/L (ref 0–37)
Albumin: 4.6 g/dL (ref 3.5–5.2)
Alkaline Phosphatase: 76 U/L (ref 39–117)
BUN: 30 mg/dL — ABNORMAL HIGH (ref 6–23)
CO2: 31 mEq/L (ref 19–32)
Calcium: 9.6 mg/dL (ref 8.4–10.5)
Chloride: 101 mEq/L (ref 96–112)
Creatinine, Ser: 1.99 mg/dL — ABNORMAL HIGH (ref 0.40–1.50)
GFR: 32.14 mL/min — ABNORMAL LOW (ref >60.00)
Glucose, Bld: 160 mg/dL — ABNORMAL HIGH (ref 70–99)
Potassium: 4.8 mEq/L (ref 3.5–5.1)
Sodium: 139 mEq/L (ref 135–145)
Total Bilirubin: 0.8 mg/dL (ref 0.2–1.2)
Total Protein: 6.6 g/dL (ref 6.0–8.3)

## 2019-04-28 LAB — CBC WITH DIFFERENTIAL/PLATELET
Basophils Absolute: 0.1 10*3/uL (ref 0.0–0.1)
Basophils Relative: 0.9 % (ref 0.0–3.0)
Eosinophils Absolute: 0.9 10*3/uL — ABNORMAL HIGH (ref 0.0–0.7)
Eosinophils Relative: 12.6 % — ABNORMAL HIGH (ref 0.0–5.0)
HCT: 38.4 % — ABNORMAL LOW (ref 39.0–52.0)
Hemoglobin: 12.7 g/dL — ABNORMAL LOW (ref 13.0–17.0)
Lymphocytes Relative: 18.4 % (ref 12.0–46.0)
Lymphs Abs: 1.4 10*3/uL (ref 0.7–4.0)
MCHC: 33.1 g/dL (ref 30.0–36.0)
MCV: 86.3 fl (ref 78.0–100.0)
Monocytes Absolute: 0.6 10*3/uL (ref 0.1–1.0)
Monocytes Relative: 7.7 % (ref 3.0–12.0)
Neutro Abs: 4.5 10*3/uL (ref 1.4–7.7)
Neutrophils Relative %: 60.4 % (ref 43.0–77.0)
Platelets: 125 10*3/uL — ABNORMAL LOW (ref 150.0–400.0)
RBC: 4.45 Mil/uL (ref 4.22–5.81)
RDW: 15.6 % — ABNORMAL HIGH (ref 11.5–15.5)
WBC: 7.4 10*3/uL (ref 4.0–10.5)

## 2019-04-28 LAB — LIPID PANEL
Cholesterol: 144 mg/dL (ref 0–200)
HDL: 39.9 mg/dL (ref >39.00)
LDL Cholesterol: 68 mg/dL (ref 0–99)
NonHDL: 103.82
Total CHOL/HDL Ratio: 4
Triglycerides: 180 mg/dL — ABNORMAL HIGH (ref 0.0–149.0)
VLDL: 36 mg/dL (ref 0.0–40.0)

## 2019-04-28 NOTE — Progress Notes (Signed)
Phone: [phone]   Subjective:  Patient presents today for their annual physical. Chief complaint-noted.   See problem oriented charting- ROS- full  review of systems was completed and negative  except for: sneezing, joint pain, back pain, neck pain, seasonal allergies  The following were reviewed and entered/updated in epic: Past Medical History:  Diagnosis Date  . Anginal pain ([place])   . Arthritis    spine  . Asthma   . CAD (coronary artery disease)    s/p PCI of the D1 with cath in 2013 showing nonobstructive disease.  CP presumed due to small vessel disease.  [name] CAP (community acquired pneumonia) 12/26/2011  . Chronic kidney disease    renal insufficiency  . COPD (chronic obstructive pulmonary disease) ([place])   . Diabetes mellitus, type 2 ([place])   . Fever 12/26/2011  . GERD (gastroesophageal reflux disease)   . Heart murmur   . Hyperlipidemia   . Hypertension   . Lyme disease    states had shakes that were thought to be parkinson's related but related to tick bite, states also RMSF  . Myocardial infarction ([place])   . Right rotator cuff tear   . Shortness of breath   . Sleep apnea    intolerant to PAP   Patient Active Problem List   Diagnosis Date Noted  . Type II diabetes mellitus with renal manifestations ([place]) 09/11/2006    Priority: High  . Coronary artery disease with angina pectoris ([place]) 09/11/2006    Priority: High  . Thrombocytopenia ([place]) 02/23/2015    Priority: Medium  . Mild persistent asthma 10/30/2010    Priority: Medium  . CKD (chronic kidney disease), stage III 02/22/2009    Priority: Medium  . Hyperlipidemia associated with type 2 diabetes mellitus ([place]) 09/11/2006    Priority: Medium  . Hypertension associated with diabetes ([place]) 09/11/2006    Priority: Medium  . Back pain 09/11/2006    Priority: Medium  . Sleep apnea 09/11/2006    Priority: Medium  . Senile purpura ([place]) 08/08/2017    Priority: Low  . GERD (gastroesophageal reflux  disease) 05/03/2014    Priority: Low  . Insomnia 05/03/2014    Priority: Low  . Allergic rhinitis 04/21/2014    Priority: Low  . Giardia 04/21/2014    Priority: Low  . NEPHROLITHIASIS, RECURRENT 01/21/2009    Priority: Low  . ACTINIC KERATOSIS, HEAD 06/14/2008    Priority: Low  . Right rotator cuff tear 12/11/2018  . Calcific tendinitis of right shoulder 09/03/2018  . AC (acromioclavicular) arthritis 09/03/2018  . Pain of right thumb 04/08/2018  . Food impaction of esophagus 04/03/2018   Past Surgical History:  Procedure Laterality Date  . APPENDECTOMY    . CARDIAC CATHETERIZATION  2013  . CERVICAL LAMINECTOMY    . CHOLECYSTECTOMY    . ESOPHAGOGASTRODUODENOSCOPY N/A 04/04/2018   Procedure: ESOPHAGOGASTRODUODENOSCOPY (EGD);  Surgeon: Lin Landsman, MD;  Location: [facility] ENDOSCOPY;  Service: Gastroenterology;  Laterality: N/A;  . KIDNEY STONE SURGERY    . LEFT HEART CATH N/A 02/15/2012   Procedure: LEFT HEART CATH;  Surgeon: Larey Dresser, MD;  Location: [facility] C[facility] LAB;  Service: Cardiovascular;  Laterality: N/A;  . SHOULDER ARTHROSCOPY WITH ROTATOR CUFF REPAIR AND SUBACROMIAL DECOMPRESSION Right 01/20/2019   Procedure: SHOULDER ARTHROSCOPY WITH ROTATOR CUFF REPAIR AND SUBACROMIAL DECOMPRESSION, BICEPS TENOTOMY;  Surgeon: [name], MD;  Location: [place];  Service: Orthopedics;  Laterality: Right;    Family History  Problem Relation Age of Onset  .  ALS Father   . Asthma Daughter   . Cancer Sister        breast/liver    Medications- reviewed and updated Current Outpatient Medications  Medication Sig Dispense Refill  . acetaminophen (TYLENOL) 325 MG tablet Take 650 mg by mouth every evening. Reported on 06/28/2015    . albuterol (PROVENTIL HFA;VENTOLIN HFA) 108 (90 Base) MCG/ACT inhaler Inhale 2 puffs into the lungs every 4 (four) hours as needed for wheezing or shortness of breath. 1 Inhaler 10  . amLODipine (NORVASC) 2.5 MG tablet Take 1 tablet  by mouth once daily 90 tablet 1  . aspirin EC 81 MG tablet Take 1 tablet (81 mg total) by mouth daily.    . ASSURE COMFORT LANCETS 30G MISC 1 each by Other route daily.    [name] atorvastatin (LIPITOR) 10 MG tablet Take 1 tablet by mouth once daily 90 tablet 0  . calcium-vitamin D (OSCAL WITH D) 500-200 MG-UNIT per tablet Take 1 tablet by mouth daily.    [name] glimepiride (AMARYL) 4 MG tablet TAKE 1 & 1/2 (ONE & ONE-HALF) TABLETS BY MOUTH ONCE DAILY 135 tablet 0  . glucose blood (ACCU-CHEK AVIVA PLUS) test strip Test blood glucose daily. 100 each 12  . glucose blood test strip One Touch Ultra Test Strips 100 each 12  . guaiFENesin (MUCINEX) 600 MG 12 hr tablet Take by mouth 2 (two) times daily.    [name] HYDROcodone-acetaminophen (NORCO) 5-325 MG tablet Take 1 tablet by mouth every 6 (six) hours as needed for moderate pain. 20 tablet 0  . isosorbide mononitrate (IMDUR) 30 MG 24 hr tablet Take 3 tablets by mouth once daily 270 tablet 0  . lisinopril (ZESTRIL) 5 MG tablet Take 1/2 (one-half) tablet by mouth once daily 45 tablet 0  . metFORMIN (GLUCOPHAGE) 500 MG tablet TAKE 1/2 (ONE-HALF) TABLET BY MOUTH ONCE DAILY WITH BREAKFAST 45 tablet 0  . metoprolol succinate (TOPROL-XL) 50 MG 24 hr tablet TAKE 1 TABLET BY MOUTH ONCE DAILY (TAKE  WITH  OR  IMMEDIATELY  FOLLOWING  A  MEAL) 90 tablet 0  . nitroGLYCERIN (NITROSTAT) 0.4 MG SL tablet Place 1 tablet (0.4 mg total) under the tongue every 5 (five) minutes as needed for chest pain. 25 tablet 1  . Omega-3 Fatty Acids (FISH OIL PO) Take 2 capsules by mouth 2 (two) times daily.    [name] omeprazole (PRILOSEC) 20 MG capsule Take 1 capsule by mouth twice daily 180 capsule 0  . OVER THE COUNTER MEDICATION Take 1 tablet by mouth at bedtime. Legatrim PM    . Polyethyl Glycol-Propyl Glycol (SYSTANE OP) Place 1 drop into both eyes 4 (four) times daily. Itchy/dry eye    . potassium citrate (UROCIT-K) 10 MEQ (1080 MG) SR tablet Take 10 mEq by mouth Twice daily.    . ranolazine  (RANEXA) 500 MG 12 hr tablet Take 1 tablet (500 mg total) by mouth 2 (two) times daily. Please keep upcoming appt for future refills. 180 tablet 3  . SYMBICORT 160-4.5 MCG/ACT inhaler INHALE 2 PUFFS BY MOUTH TWICE DAILY. 11 g 5  . traMADol (ULTRAM) 50 MG tablet TAKE 1 TABLET BY MOUTH EVERY 6 HOURS AS NEEDED FOR MODERATE PAIN. (TWICE PER DAY MAXIMUM) 60 tablet 2  . traZODone (DESYREL) 150 MG tablet Take 1 tablet by mouth once daily 90 tablet 0  . zinc sulfate 220 MG capsule Take 220 mg by mouth daily.     No current facility-administered medications for this visit.  Allergies-reviewed and updated Allergies  Allergen Reactions  . Codeine Sulfate Other (See Comments)    Chest pain  . Crestor [Rosuvastatin Calcium]   . Cephalexin Rash    Social History   Social History Narrative   Married (wife patient of Dr. Yong Channel). 3 children. 4 grandchildren. 3 greatgrandchildren.       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 TM normal. Bridge of nose normal. Eyelids normal.  Neck: no thyromegaly or cervical lymphadenopathy  CV: RRR no murmurs rubs or gallops Lungs: CTAB no crackles, wheeze, rhonchi Abdomen: soft/nontender/nondistended/normal bowel sounds. No rebound or guarding.  Ext: no edema Skin: warm, dry Neuro: grossly normal, moves all extremities, PERRLA    Assessment and Plan  86 y.o. male presenting for annual physical.  Health Maintenance counseling: 1. Anticipatory guidance: Patient counseled regarding regular dental exams yes q6 months- has upcoming visit, eye exams yes yearly- needs to update now,  avoiding smoking and second hand smoke yes, limiting alcohol to 0 beverages per day.   2. Risk  factor reduction:  Advised patient of need for regular exercise and diet rich and fruits and vegetables to reduce risk of heart attack and stroke. Exercise- active during the day. Diet-eats out some  and cooks at home. Reasonably healthy diet- weight largely stable in 180s Wt Readings from Last 3 Encounters:  04/28/19 186 lb 3.2 oz (84.5 kg)  01/27/19 182 lb (82.6 kg)  01/20/19 184 lb 4.9 oz (83.6 kg)  3. Immunizations/screenings/ancillary studies- from avs "Please let us know the name of the shot you got as well as the dates- we want to log both into the computer. Can wait until after 2nd shot if you would like  " Immunization History  Administered Date(s) Administered  . Influenza Split 12/04/2010, 01/23/2012  . Influenza Whole 12/26/2006, 11/28/2007, 11/24/2008, 12/30/2009  . Influenza, High Dose Seasonal PF 01/13/2013, 12/10/2013, 11/21/2017, 11/19/2018  . Influenza-Unspecified 10/31/2015, 11/21/2016  . Pneumococcal Conjugate-13 07/27/2014  . Pneumococcal Polysaccharide-23 06/27/2005  . Td 06/27/2005, 04/09/2017  . Zoster 10/31/2015  . Zoster Recombinat (Shingrix) 09/30/2018, 12/22/2018   Health Maintenance Due  Topic Date Due  . OPHTHALMOLOGY EXAM -has one coming up 01/14/2019   4. Prostate cancer screening- no more PSA tests per urology  Lab Results  Component Value Date   PSA 0.78 02/14/2009   PSA 0.80 08/13/2008   PSA 0.76 03/15/2008   5. Colon cancer screening - past age based screening. No blood in stool noted. If anemia were to worsen- would consider stool cards  6. Skin cancer screening- no recent dermatology visits- has seen within 5 years will follow up as needed. advised regular sunscreen use. Denies worrisome, changing, or new skin lesions.  7. Former smoker- no regular cancer screenings required  Status of chronic or acute concerns  #Anemia/thrombocytopenia- Hematology 01/13/2019 said no further work-up and released patient to as needed  #Right shoulder surgery was  really helpful- actually worst pain he had was down in base of his thumb but that's improving now. Has a brace- not sure how helpful it is.   # Diabetes S:compliant with  Metformin 500mg  half tablet with breakfast, glimepiride 4 mg Lab Results  Component Value Date   HGBA1C 6.7 (H) 12/30/2018  A/P: good control on last check- update today - continue current meds  # CAD with angina/Hyperlipidemia S:compliant with atorvastatin 10mg  and aspirin 81mg . LDL goal <70 . Asymptomatic while on ranexa and imdur.  Lab Results  Component Value Date   CHOL 124 09/02/2018   HDL 43.70 09/02/2018   LDLCALC 60 09/02/2018   LDLDIRECT 55.0 12/30/2018   TRIG 100.0 09/02/2018   CHOLHDL 3 09/02/2018  A/P:  stable x2- continue current meds   # Hypertension/CKD III S:compliant with  Amlodipine 2.5, imdur 90mg , lisinopril 5 mg, metoprolol 50mg  XR -on lisinopril in case proteinuric element. GFR usually 30-40 range BP Readings from Last 3 Encounters:  04/28/19 (!) 142/68  01/27/19 (!) 138/58  01/20/19 117/66  A/P: blood pressure slightly high but he reports taking medicine immediately before coming in building so may not have had enough time to fully be in system- has been controlled in past and at home reports 120s-130s/60s- will continue current meds for now. He will monitor at home and if blood pressure >135/85 regularly will let me know  Hopefully kidneys stable- update cmp. Avoid nsaids  # Asthma S: compliant with symbicort. uses albuterol - almost never uses.  A/P:Stable. Continue current medications.  - could even consider changing symbicort to as needed. He wants to continue current regimen for now  # senile purpura- noted while on aspirin  #has some orthopedic pains with neck and back pain- takes tramadol sparingly- once a week maximum  Recommended follow up: Return in about 6 months (around 10/26/2019) for follow up- or sooner if needed.  Lab/Order associations: fasting   ICD-10-CM   1.  Preventative health care  Z00.00   2. Hypertension associated with diabetes ([place]) Chronic E11.59    I10   3. Thrombocytopenia (HCC) Chronic D69.6   4. Coronary artery disease involving native coronary artery of native heart with angina pectoris ([facility]) Chronic I25.119   5. Type 2 diabetes mellitus with diabetic nephropathy, without long-term current use of insulin ([facility])  E11.21   6. Mild persistent asthma without complication  J28.78   7. Stage 3 chronic kidney disease, unspecified whether stage 3a or 3b CKD  N18.30   8. Senile purpura ([facility])  D69.2     No orders of the defined types were placed in this encounter.   Return precautions advised.  [name], MD

## 2019-04-28 NOTE — Patient Instructions (Addendum)
Health Maintenance Due  Topic Date Due  . OPHTHALMOLOGY EXAM - please get updated diabetic eye exam and have them send [place] a copy 01/14/2019   Please let us know the name of the covid 19 shot you got as well as the dates- we want to log both into the computer. Can wait until after 2nd shot if you would like   He will monitor at home and if blood pressure >135/85 regularly will let me know   Recommended follow up: Return in about 6 months (around 10/26/2019) for follow up- or sooner if needed.

## 2019-04-29 DIAGNOSIS — M75121 Complete rotator cuff tear or rupture of right shoulder, not specified as traumatic: Secondary | ICD-10-CM | POA: Diagnosis not present

## 2019-04-29 DIAGNOSIS — M25611 Stiffness of right shoulder, not elsewhere classified: Secondary | ICD-10-CM | POA: Diagnosis not present

## 2019-04-30 LAB — HEMOGLOBIN A1C: Hgb A1c MFr Bld: 6.7 % — ABNORMAL HIGH (ref 4.6–6.5)

## 2019-05-04 ENCOUNTER — Other Ambulatory Visit: Payer: Self-pay | Admitting: Family Medicine

## 2019-05-12 ENCOUNTER — Other Ambulatory Visit: Payer: Self-pay | Admitting: Family Medicine

## 2019-05-18 DIAGNOSIS — H524 Presbyopia: Secondary | ICD-10-CM | POA: Diagnosis not present

## 2019-05-18 DIAGNOSIS — E119 Type 2 diabetes mellitus without complications: Secondary | ICD-10-CM | POA: Diagnosis not present

## 2019-05-18 DIAGNOSIS — H52203 Unspecified astigmatism, bilateral: Secondary | ICD-10-CM | POA: Diagnosis not present

## 2019-05-18 DIAGNOSIS — H353131 Nonexudative age-related macular degeneration, bilateral, early dry stage: Secondary | ICD-10-CM | POA: Diagnosis not present

## 2019-05-18 DIAGNOSIS — Z961 Presence of intraocular lens: Secondary | ICD-10-CM | POA: Diagnosis not present

## 2019-05-18 DIAGNOSIS — H401131 Primary open-angle glaucoma, bilateral, mild stage: Secondary | ICD-10-CM | POA: Diagnosis not present

## 2019-05-22 ENCOUNTER — Other Ambulatory Visit: Payer: Self-pay | Admitting: Family Medicine

## 2019-05-29 DIAGNOSIS — Z09 Encounter for follow-up examination after completed treatment for conditions other than malignant neoplasm: Secondary | ICD-10-CM | POA: Diagnosis not present

## 2019-06-12 ENCOUNTER — Other Ambulatory Visit: Payer: Self-pay | Admitting: Family Medicine

## 2019-06-18 DIAGNOSIS — H401131 Primary open-angle glaucoma, bilateral, mild stage: Secondary | ICD-10-CM | POA: Diagnosis not present

## 2019-06-23 ENCOUNTER — Other Ambulatory Visit: Payer: Self-pay | Admitting: Family Medicine

## 2019-06-23 DIAGNOSIS — E1122 Type 2 diabetes mellitus with diabetic chronic kidney disease: Secondary | ICD-10-CM

## 2019-06-23 DIAGNOSIS — N183 Chronic kidney disease, stage 3 unspecified: Secondary | ICD-10-CM

## 2019-07-14 ENCOUNTER — Other Ambulatory Visit: Payer: Self-pay | Admitting: Family Medicine

## 2019-07-21 ENCOUNTER — Other Ambulatory Visit: Payer: Self-pay | Admitting: Family Medicine

## 2019-07-24 ENCOUNTER — Telehealth: Payer: Self-pay | Admitting: Family Medicine

## 2019-07-24 NOTE — Telephone Encounter (Signed)
Pt called stating he received the Moderna Vaccine.   First: 03/15/2019  Second: 04/15/2019

## 2019-07-24 NOTE — Telephone Encounter (Signed)
Documented

## 2019-08-08 ENCOUNTER — Encounter (HOSPITAL_COMMUNITY): Payer: Self-pay

## 2019-08-08 ENCOUNTER — Ambulatory Visit (HOSPITAL_COMMUNITY)
Admission: EM | Admit: 2019-08-08 | Discharge: 2019-08-08 | Disposition: A | Discharge status: Home / Self Care | Payer: PPO | Attending: Family Medicine | Admitting: Family Medicine

## 2019-08-08 ENCOUNTER — Other Ambulatory Visit: Payer: Self-pay

## 2019-08-08 ENCOUNTER — Ambulatory Visit (INDEPENDENT_AMBULATORY_CARE_PROVIDER_SITE_OTHER): Payer: PPO

## 2019-08-08 DIAGNOSIS — M25572 Pain in left ankle and joints of left foot: Secondary | ICD-10-CM | POA: Diagnosis not present

## 2019-08-08 DIAGNOSIS — M25472 Effusion, left ankle: Secondary | ICD-10-CM

## 2019-08-08 DIAGNOSIS — S99912A Unspecified injury of left ankle, initial encounter: Secondary | ICD-10-CM | POA: Diagnosis not present

## 2019-08-08 DIAGNOSIS — S93402A Sprain of unspecified ligament of left ankle, initial encounter: Secondary | ICD-10-CM | POA: Diagnosis not present

## 2019-08-08 DIAGNOSIS — R600 Localized edema: Secondary | ICD-10-CM | POA: Diagnosis not present

## 2019-08-08 DIAGNOSIS — M7989 Other specified soft tissue disorders: Secondary | ICD-10-CM | POA: Diagnosis not present

## 2019-08-08 MED ORDER — TRAMADOL HCL 50 MG PO TABS: 50.0000 mg | ORAL_TABLET | Freq: Four times a day (QID) | ORAL | 0 refills | Status: DC | PRN

## 2019-08-08 NOTE — ED Triage Notes (Cosign Needed)
Pt states he was working in the garden and he got stuck in the mud and he twisted his left ankle. This happened yesterday.

## 2019-08-08 NOTE — ED Provider Notes (Signed)
Roy [name]    CSN: [id number] Arrival date & time: 08/08/19  1004      History   Chief Complaint Chief Complaint  Patient presents with   Ankle Pain    HPI Roy [name] is a 86 y.o. male.   HPI  Ankle Pain: Patient complains of left ankle pain.  Onset of the symptoms was yesterday. Inciting event: twisting his ankle after stepping in a hole.. Current symptoms include ability to bear weight, but with some pain, stiffness and swelling.  Aggravating symptoms: any weight bearing and ROM. Patient's overall course: gradually worsening. Patient has had no prior ankle problems. Treatment to date: one dose of pain medication last night. He has not applied ice to reduce the swelling..  Past Medical History:  Diagnosis Date   Anginal pain ([place])    Arthritis    spine   Asthma    CAD (coronary artery disease)    s/p PCI of the D1 with cath in 2013 showing nonobstructive disease.  CP presumed due to small vessel disease.   CAP (community acquired pneumonia) 12/26/2011   Chronic kidney disease    renal insufficiency   COPD (chronic obstructive pulmonary disease) (HCC)    Diabetes mellitus, type 2 ([place])    Fever 12/26/2011   GERD (gastroesophageal reflux disease)    Heart murmur    Hyperlipidemia    Hypertension    Lyme disease    states had shakes that were thought to be parkinson's related but related to tick bite, states also RMSF   Myocardial infarction [facility])    Right rotator cuff tear    Shortness of breath    Sleep apnea    intolerant to PAP    Patient Active Problem List   Diagnosis Date Noted   Right rotator cuff tear 12/11/2018   Calcific tendinitis of right shoulder 09/03/2018   AC (acromioclavicular) arthritis 09/03/2018   Pain of right thumb 04/08/2018   Food impaction of esophagus 04/03/2018   Senile purpura ([place]) 08/08/2017   Thrombocytopenia ([place]) 02/23/2015   GERD (gastroesophageal reflux disease) 05/03/2014   Insomnia  05/03/2014   Allergic rhinitis 04/21/2014   Giardia 04/21/2014   Mild persistent asthma 10/30/2010   CKD (chronic kidney disease), stage III 02/22/2009   NEPHROLITHIASIS, RECURRENT 01/21/2009   ACTINIC KERATOSIS, HEAD 06/14/2008   Type II diabetes mellitus with renal manifestations ([place]) 09/11/2006   Hyperlipidemia associated with type 2 diabetes mellitus ([place]) 09/11/2006   Hypertension associated with diabetes ([place]) 09/11/2006   Coronary artery disease with angina pectoris ([place]) 09/11/2006   Back pain 09/11/2006   Sleep apnea 09/11/2006    Past Surgical History:  Procedure Laterality Date   APPENDECTOMY     CARDIAC CATHETERIZATION  2013   CERVICAL LAMINECTOMY     CHOLECYSTECTOMY     ESOPHAGOGASTRODUODENOSCOPY N/A 04/04/2018   Procedure: ESOPHAGOGASTRODUODENOSCOPY (EGD);  Surgeon: Lin Landsman, MD;  Location[facility] ENDOSCOPY;  Service: Gastroenterology;  Laterality: N/A;   KIDNEY STONE SURGERY     LEFT HEART CATH N/A 02/15/2012   Procedure: LEFT HEART CATH;  Surgeon: Larey Dresser, MD;  Location: [facility] CATH LAB;  Service: Cardiovascular;  Laterality: N/A;   SHOULDER ARTHROSCOPY WITH ROTATOR CUFF REPAIR AND SUBACROMIAL DECOMPRESSION Right 01/20/2019   Procedure: SHOULDER ARTHROSCOPY WITH ROTATOR CUFF REPAIR AND SUBACROMIAL DECOMPRESSION, BICEPS TENOTOMY;  Surgeon: [name], MD;  Location: [place];  Service: Orthopedics;  Laterality: Right;       Home Medications    Prior  to Admission medications   Medication Sig Start Date End Date Taking? Authorizing Provider  acetaminophen (TYLENOL) 325 MG tablet Take 650 mg by mouth every evening. Reported on 06/28/2015    [provider]  albuterol (PROVENTIL HFA;VENTOLIN HFA) 108 (90 Base) MCG/ACT inhaler Inhale 2 puffs into the lungs every 4 (four) hours as needed for wheezing or shortness of breath. 08/08/17   Marin Olp, MD  amLODipine (NORVASC) 2.5 MG tablet Take 1 tablet by  mouth once daily 05/12/19   Marin Olp, MD  aspirin EC 81 MG tablet Take 1 tablet (81 mg total) by mouth daily. 03/11/12   Larey Dresser, MD  ASSURE COMFORT LANCETS 30G MISC 1 each by Other route daily. 12/02/13   [provider]  atorvastatin (LIPITOR) 10 MG tablet Take 1 tablet by mouth once daily 05/22/19   Marin Olp, MD  calcium-vitamin D (OSCAL WITH D) 500-200 MG-UNIT per tablet Take 1 tablet by mouth daily.    [provider]  glimepiride (AMARYL) 4 MG tablet TAKE 1 & 1/2 (ONE & ONE-HALF) TABLETS BY MOUTH ONCE DAILY 06/23/19   Marin Olp, MD  glucose blood (ACCU-CHEK AVIVA PLUS) test strip Test blood glucose daily. 12/30/18   Marin Olp, MD  glucose blood test strip One Touch Ultra Test Strips 08/02/15   Marin Olp, MD  guaiFENesin (MUCINEX) 600 MG 12 hr tablet Take by mouth 2 (two) times daily.    [provider]  HYDROcodone-acetaminophen (NORCO) 5-325 MG tablet Take 1 tablet by mouth every 6 (six) hours as needed for moderate pain. 01/20/19   Grier Mitts, PA-C  isosorbide mononitrate (IMDUR) 30 MG 24 hr tablet Take 3 tablets by mouth once daily 06/23/19   Marin Olp, MD  lisinopril (ZESTRIL) 5 MG tablet Take 1/2 (one-half) tablet by mouth once daily 06/23/19   Marin Olp, MD  metFORMIN (GLUCOPHAGE) 500 MG tablet TAKE 1/2 (ONE-HALF) TABLET BY MOUTH ONCE DAILY WITH BREAKFAST 06/12/19   Marin Olp, MD  metoprolol succinate (TOPROL-XL) 50 MG 24 hr tablet TAKE 1 TABLET BY MOUTH ONCE DAILY (TAKE  WITH  OR  IMMEDIATELY  FOLLOWING  A  MEAL) 07/21/19   Marin Olp, MD  nitroGLYCERIN (NITROSTAT) 0.4 MG SL tablet Place 1 tablet (0.4 mg total) under the tongue every 5 (five) minutes as needed for chest pain. 04/09/17   Marin Olp, MD  Omega-3 Fatty Acids (FISH OIL PO) Take 2 capsules by mouth 2 (two) times daily.    [provider]  omeprazole (PRILOSEC) 20 MG capsule Take 1 capsule by mouth twice daily  07/14/19   Marin Olp, MD  OVER THE COUNTER MEDICATION Take 1 tablet by mouth at bedtime. Legatrim PM    [provider]  Polyethyl Glycol-Propyl Glycol (SYSTANE OP) Place 1 drop into both eyes 4 (four) times daily. Itchy/dry eye    [provider]  potassium citrate (UROCIT-K) 10 MEQ (1080 MG) SR tablet Take 10 mEq by mouth Twice daily. 01/29/12   [provider]  ranolazine (RANEXA) 500 MG 12 hr tablet Take 1 tablet (500 mg total) by mouth 2 (two) times daily. Please keep upcoming appt for future refills. 08/06/18 08/06/19  Sueanne Margarita, MD  SYMBICORT 160-4.5 MCG/ACT inhaler Inhale 2 puffs by mouth twice daily 07/14/19   Marin Olp, MD  traMADol (ULTRAM) 50 MG tablet TAKE 1 TABLET BY MOUTH EVERY 6 HOURS AS NEEDED FOR MODERATE PAIN. (TWICE  PER DAY MAXIMUM) 12/30/18   Marin Olp, MD  traZODone (DESYREL) 150 MG tablet Take 1 tablet by mouth once daily 07/14/19   Marin Olp, MD  zinc sulfate 220 MG capsule Take 220 mg by mouth daily.    [provider]    Family History Family History  Problem Relation Age of Onset   ALS Father    Asthma Daughter    Cancer Sister        breast/liver    Social History Social History   Tobacco Use   Smoking status: Former Smoker    Packs/day: 1.50    Years: 8.00    Pack years: 12.00    Types: Cigarettes    Quit date: 03/05/1958    Years since quitting: 61.4   Smokeless tobacco: Never Used  Substance Use Topics   Alcohol use: No    Alcohol/week: 0.0 standard drinks   Drug use: No     Allergies   Codeine sulfate, Crestor [rosuvastatin calcium], and Cephalexin   Review of Systems Review of Systems Pertinent negatives listed in HPI Physical Exam Triage Vital Signs ED Triage Vitals  Enc Vitals Group     BP      Pulse      Resp      Temp      Temp src      SpO2      Weight      Height      Head Circumference      Peak Flow      Pain Score      Pain Loc      Pain Edu?       Excl. in [place]?    No data found.  Updated Vital Signs There were no vitals taken for this visit.  Visual Acuity Right Eye Distance:   Left Eye Distance:   Bilateral Distance:    Right Eye Near:   Left Eye Near:    Bilateral Near:     Physical Exam General appearance: alert, well developed, well nourished, cooperative and in no distress Head: Normocephalic, without obvious abnormality, atraumatic Respiratory: Respirations even and unlabored, normal respiratory rate Heart: rate and rhythm normal. No gallop or murmurs noted on exam  Abdomen: BS +, no distention, no rebound tenderness, or no mass Extremities: Left ankle edematous, mild medial malleolus brusing. Limited ROM Skin: Skin color, texture, turgor normal. No rashes seen  Psych: Appropriate mood and affect.  UC Treatments / Results  Labs (all labs ordered are listed, but only abnormal results are displayed) Labs Reviewed - No data to display  EKG   Radiology No results found.  Procedures Procedures (including critical care time)  Medications Ordered in UC Medications - No data to display  Initial Impression / Assessment and Plan / UC Course  I have reviewed the triage vital signs and the nursing notes.  Pertinent labs & imaging results that were available during my care of the patient were reviewed by me and considered in my medical decision making (see chart for details).    No fracture seen on imaging today. Will treat as a ankle sprain. Placed in a ASO ankle brace. Prescribed short course of tramadol for pain. If no improvement, advised to follow-up with [facility] orthopedic if unable to bear weight without pain in 2 days.  An After Visit Summary was printed and given to the patient/family. Precautions discussed. Red flags discussed. Questions invited and answered. They voiced understanding and agreement. Final Clinical  Impressions(s) / UC Diagnoses   Final diagnoses:  Sprain of left ankle,  unspecified ligament, initial encounter     Discharge Instructions     If no improvement of pain, contact [employer] on Monday. Ice ankle and remain non-weight bearing as much as possible today. A short course of tramadol prescribed for pain sent to your pharmacy.    ED Prescriptions    Medication Sig Dispense Auth. Provider   traMADol (ULTRAM) 50 MG tablet Take 1 tablet (50 mg total) by mouth every 6 (six) hours as needed. 15 tablet Scot Jun, FNP     I have reviewed the PDMP during this encounter.   Scot Jun, FNP 08/08/19 [phone]

## 2019-08-08 NOTE — Discharge Instructions (Signed)
If no improvement of pain, contact [employer] on Monday. Ice ankle and remain non-weight bearing as much as possible today. A short course of tramadol prescribed for pain sent to your pharmacy.

## 2019-08-11 ENCOUNTER — Other Ambulatory Visit: Payer: Self-pay | Admitting: Family Medicine

## 2019-08-11 ENCOUNTER — Other Ambulatory Visit: Payer: Self-pay | Admitting: Cardiology

## 2019-08-11 DIAGNOSIS — I10 Essential (primary) hypertension: Secondary | ICD-10-CM

## 2019-08-13 ENCOUNTER — Other Ambulatory Visit: Payer: Self-pay | Admitting: Family Medicine

## 2019-09-14 ENCOUNTER — Other Ambulatory Visit: Payer: Self-pay | Admitting: Family Medicine

## 2019-09-16 ENCOUNTER — Other Ambulatory Visit: Payer: Self-pay | Admitting: Cardiology

## 2019-09-16 ENCOUNTER — Other Ambulatory Visit: Payer: Self-pay | Admitting: Family Medicine

## 2019-09-16 DIAGNOSIS — I10 Essential (primary) hypertension: Secondary | ICD-10-CM

## 2019-09-18 DIAGNOSIS — H401131 Primary open-angle glaucoma, bilateral, mild stage: Secondary | ICD-10-CM | POA: Diagnosis not present

## 2019-09-23 ENCOUNTER — Other Ambulatory Visit: Payer: Self-pay | Admitting: Family Medicine

## 2019-09-23 DIAGNOSIS — N183 Chronic kidney disease, stage 3 unspecified: Secondary | ICD-10-CM

## 2019-09-25 ENCOUNTER — Telehealth: Payer: Self-pay | Admitting: Family Medicine

## 2019-09-25 NOTE — Progress Notes (Signed)
  Chronic Care Management   Outreach Note  09/25/2019 Name: [name] MRN: [id number] DOB: 09/20/1932  Referred by: Marin Olp, MD Reason for referral : Chronic Care Management (Initial CCM Outreach)   An unsuccessful telephone outreach was attempted today. The patient was referred to the pharmacist for assistance with care management and care coordination.   Follow Up Plan:   Roy [name]

## 2019-09-30 ENCOUNTER — Other Ambulatory Visit: Payer: Self-pay | Admitting: Cardiology

## 2019-09-30 DIAGNOSIS — I10 Essential (primary) hypertension: Secondary | ICD-10-CM

## 2019-10-07 ENCOUNTER — Other Ambulatory Visit: Payer: Self-pay | Admitting: Cardiology

## 2019-10-07 DIAGNOSIS — I10 Essential (primary) hypertension: Secondary | ICD-10-CM

## 2019-10-12 ENCOUNTER — Other Ambulatory Visit: Payer: Self-pay | Admitting: Cardiology

## 2019-10-12 ENCOUNTER — Telehealth: Payer: Self-pay | Admitting: Cardiology

## 2019-10-12 DIAGNOSIS — I10 Essential (primary) hypertension: Secondary | ICD-10-CM

## 2019-10-12 MED ORDER — RANOLAZINE ER 500 MG PO TB12: 500.0000 mg | ORAL_TABLET | Freq: Two times a day (BID) | ORAL | 0 refills | Status: DC

## 2019-10-12 NOTE — Telephone Encounter (Signed)
*  STAT* If patient is at the pharmacy, call can be transferred to refill team.   1. Which medications need to be refilled? (please list name of each medication and dose if known) ranolazine (RANEXA) 500 MG 12 hr tablet  2. Which pharmacy/location (including street and city if local pharmacy) is medication to be sent to? [place], [place]  3. Do they need a 30 day or 90 day supply? 30 day   Patient is out of medication. He is scheduled 12/08/2019.

## 2019-10-12 NOTE — Telephone Encounter (Signed)
Pt's medication was sent to pt's pharmacy as requested. Confirmation received.  °

## 2019-10-15 ENCOUNTER — Other Ambulatory Visit: Payer: Self-pay | Admitting: Family Medicine

## 2019-10-15 NOTE — Telephone Encounter (Signed)
Did you want patient to continue or was it a one time thing

## 2019-10-16 ENCOUNTER — Other Ambulatory Visit: Payer: Self-pay | Admitting: Family Medicine

## 2019-10-21 DIAGNOSIS — Z87442 Personal history of urinary calculi: Secondary | ICD-10-CM | POA: Diagnosis not present

## 2019-10-21 DIAGNOSIS — R351 Nocturia: Secondary | ICD-10-CM | POA: Diagnosis not present

## 2019-10-22 ENCOUNTER — Telehealth: Payer: Self-pay | Admitting: Family Medicine

## 2019-10-22 NOTE — Progress Notes (Signed)
  Chronic Care Management   Note  10/22/2019 Name: Roy [name] MRN: [id number] DOB: 20-May-1932  Roy [name] is a 86 y.o. year old male who is a primary care patient of Marin Olp, MD. I reached out to Roy [name] by phone today in response to a referral sent by Roy [name]'s PCP, Marin Olp, MD.   Roy [name] was given information about Chronic Care Management services today including:  1. CCM service includes personalized support from designated clinical staff supervised by his physician, including individualized plan of care and coordination with other care providers 2. 24/7 contact phone numbers for assistance for urgent and routine care needs. 3. Service will only be billed when office clinical staff spend 20 minutes or more in a month to coordinate care. 4. Only one practitioner may furnish and bill the service in a calendar month. 5. The patient may stop CCM services at any time (effective at the end of the month) by phone call to the office staff.   Patient agreed to services and verbal consent obtained.   Follow up plan:   Earney [place] [employer] Scheduler

## 2019-10-23 ENCOUNTER — Other Ambulatory Visit: Payer: Self-pay | Admitting: Family Medicine

## 2019-10-28 NOTE — Patient Instructions (Addendum)
Please stop by lab before you go If you have mychart- we will send your results within 3 business days of [place] receiving them.  If you do not have mychart- we will call you about results within 5 business days of [place] receiving them.  *please note we are currently using [facility] which has a longer processing time than [place] typically so labs may not come back as quickly as in the past *please also note that you will see labs on mychart as soon as they post. I will later go in and write notes on them- will say "notes from Dr. Yong Channel"  Health Maintenance Due  Topic Date Due  . OPHTHALMOLOGY EXAM eye exam was about two months ago with dr. Celso Sickle. 01/14/2019  . INFLUENZA VACCINE -please let us know when you have gotten this. 10/04/2019   Monitor blood pressure at home and we want it to be less than 138/88 if possible on average.  Please bring your home cuff with you to next visit  # for back pain- I am willing to refill tramadol but does not look like he has picked up prescription from June- he will check with pharmacy and let me know  Refilled strips today and cholesterol medicine

## 2019-10-28 NOTE — Progress Notes (Signed)
Phone [phone] In person visit   Subjective:   Roy [name] is a 86 y.o. year old very pleasant male patient who presents for/with See problem oriented charting Chief Complaint  Patient presents with  . Follow-up  . Neck Pain    This visit occurred during the SARS-CoV-2 public health emergency.  Safety protocols were in place, including screening questions prior to the visit, additional usage of staff PPE, and extensive cleaning of exam room while observing appropriate contact time as indicated for disinfecting solutions.   Past Medical History-  Patient Active Problem List   Diagnosis Date Noted  . Type II diabetes mellitus with renal manifestations ([place]) 09/11/2006    Priority: High  . Coronary artery disease with angina pectoris ([place]) 09/11/2006    Priority: High  . Thrombocytopenia ([place]) 02/23/2015    Priority: Medium  . Mild persistent asthma 10/30/2010    Priority: Medium  . CKD (chronic kidney disease), stage III 02/22/2009    Priority: Medium  . Hyperlipidemia associated with type 2 diabetes mellitus ([place]) 09/11/2006    Priority: Medium  . Hypertension associated with diabetes ([place]) 09/11/2006    Priority: Medium  . Back pain 09/11/2006    Priority: Medium  . Sleep apnea 09/11/2006    Priority: Medium  . Senile purpura ([place]) 08/08/2017    Priority: Low  . GERD (gastroesophageal reflux disease) 05/03/2014    Priority: Low  . Insomnia 05/03/2014    Priority: Low  . Allergic rhinitis 04/21/2014    Priority: Low  . Giardia 04/21/2014    Priority: Low  . NEPHROLITHIASIS, RECURRENT 01/21/2009    Priority: Low  . ACTINIC KERATOSIS, HEAD 06/14/2008    Priority: Low  . Right rotator cuff tear 12/11/2018  . Calcific tendinitis of right shoulder 09/03/2018  . AC (acromioclavicular) arthritis 09/03/2018  . Pain of right thumb 04/08/2018  . Food impaction of esophagus 04/03/2018    Medications- reviewed and updated Current Outpatient Medications  Medication  Sig Dispense Refill  . acetaminophen (TYLENOL) 325 MG tablet Take 650 mg by mouth every evening. Reported on 06/28/2015    . albuterol (PROVENTIL HFA;VENTOLIN HFA) 108 (90 Base) MCG/ACT inhaler Inhale 2 puffs into the lungs every 4 (four) hours as needed for wheezing or shortness of breath. 1 Inhaler 10  . amLODipine (NORVASC) 2.5 MG tablet Take 1 tablet by mouth once daily 90 tablet 0  . aspirin EC 81 MG tablet Take 1 tablet (81 mg total) by mouth daily.    . ASSURE COMFORT LANCETS 30G MISC 1 each by Other route daily.    [name] atorvastatin (LIPITOR) 10 MG tablet Take 1 tablet (10 mg total) by mouth daily. 90 tablet 3  . calcium-vitamin D (OSCAL WITH D) 500-200 MG-UNIT per tablet Take 1 tablet by mouth daily.    [name] glimepiride (AMARYL) 4 MG tablet TAKE 1 & 1/2 (ONE & ONE-HALF) TABLETS BY MOUTH ONCE DAILY 135 tablet 0  . glucose blood (ACCU-CHEK AVIVA PLUS) test strip Test blood glucose daily. 100 each 12  . guaiFENesin (MUCINEX) 600 MG 12 hr tablet Take by mouth 2 (two) times daily.    [name] HYDROcodone-acetaminophen (NORCO) 5-325 MG tablet Take 1 tablet by mouth every 6 (six) hours as needed for moderate pain. 20 tablet 0  . isosorbide mononitrate (IMDUR) 30 MG 24 hr tablet Take 3 tablets by mouth once daily 270 tablet 0  . lisinopril (ZESTRIL) 5 MG tablet Take 1/2 (one-half) tablet by mouth once daily 45 tablet 0  .  metFORMIN (GLUCOPHAGE) 500 MG tablet TAKE 1/2 (ONE-HALF) TABLET BY MOUTH ONCE DAILY WITH BREAKFAST 45 tablet 0  . metoprolol succinate (TOPROL-XL) 50 MG 24 hr tablet TAKE 1 TABLET BY MOUTH ONCE DAILY (TAKE  WITH  OR  IMMEDIATELY  FOLLOWING  A  MEAL) 90 tablet 0  . nitroGLYCERIN (NITROSTAT) 0.4 MG SL tablet Place 1 tablet (0.4 mg total) under the tongue every 5 (five) minutes as needed for chest pain. 25 tablet 1  . Omega-3 Fatty Acids (FISH OIL PO) Take 2 capsules by mouth 2 (two) times daily.    [name] omeprazole (PRILOSEC) 20 MG capsule Take 1 capsule by mouth twice daily 180 capsule 0  . OVER  THE COUNTER MEDICATION Take 1 tablet by mouth at bedtime. Legatrim PM    . Polyethyl Glycol-Propyl Glycol (SYSTANE OP) Place 1 drop into both eyes 4 (four) times daily. Itchy/dry eye    . potassium citrate (UROCIT-K) 10 MEQ (1080 MG) SR tablet Take 10 mEq by mouth Twice daily.    . ranolazine (RANEXA) 500 MG 12 hr tablet Take 1 tablet (500 mg total) by mouth 2 (two) times daily. Please keep upcoming appt in October with Dr. Radford Pax before anymore refills. Thank you 180 tablet 0  . SYMBICORT 160-4.5 MCG/ACT inhaler Inhale 2 puffs by mouth twice daily 11 g 5  . traMADol (ULTRAM) 50 MG tablet TAKE 1 TABLET BY MOUTH EVERY 6 HOURS AS NEEDED FOR  MODERATE  PAIN  (TWICE  PER  DAY  MAXIMUM) 60 tablet 2  . traZODone (DESYREL) 150 MG tablet Take 1 tablet by mouth once daily 90 tablet 0  . zinc sulfate 220 MG capsule Take 220 mg by mouth daily.     No current facility-administered medications for this visit.     Objective:  BP (!) 142/60   Pulse (!) 54   Temp 98 F (36.7 C) (Temporal)   Ht 5\' 9"  (1.753 m)   Wt 188 lb 3.2 oz (85.4 kg)   SpO2 98%   BMI 27.79 kg/m  Gen: NAD, resting comfortably CV: slightly bradycardic Lungs: CTAB no crackles, wheeze, rhonchi Ext: no edema Skin: warm, dry    Assessment and Plan   # Diabetes S:compliant with  Metformin 500mg  half tablet with breakfast, glimepiride 6 mg. No low blood sugars thankfully Exercise and diet- very active at home- plans to mow 3.5 acres when he gets home.  Reasonably healthy diet Lab Results  Component Value Date   HGBA1C 6.7 (H) 04/28/2019   HGBA1C 6.7 (H) 12/30/2018   HGBA1C 7.2 (H) 09/02/2018  A/P: hopefully controlled- update a1c with labs. Refilled strips. Thankful no lows.    # CAD with angina/Hyperlipidemia S:compliant with atorvastatin 10mg  and aspirin 81mg . LDL goal <70 . Asymptomatic on ranexa and imdur- ran out of ranexa short period and had some chest pain but now back on Lab Results  Component Value Date   CHOL 144  04/28/2019   HDL 39.90 04/28/2019   LDLCALC 68 04/28/2019   LDLDIRECT 55.0 12/30/2018   TRIG 180.0 (H) 04/28/2019   CHOLHDL 4 04/28/2019  A/P: Did have some short-term pain when running out of Ranexa but back on Ranexa is having no issues.  Continue atorvastatin and aspirin.  LDL has been at goal under 70-continue current medications  # Hypertension/CKD III S:compliant with  Amlodipine 2.5, imdur 90mg , lisinopril 2.5 mg, metoprolol 50mg  XR -on lisinopril in case proteinuric element. GFR usually 30-40 range BP Readings from Last 3 Encounters:  10/29/19 [name])  142/60  08/08/19 (!) 153/64  04/28/19 (!) 142/68  A/P: Blood pressure slightly high in office but excellent control at home in the 120/70 range.  Encouraged him to bring his cuff to next visit so we can make sure it is accurate but I hesitate to increase his doses of medication if blood pressure is in fact that good at home.  Follows with kidney doctor- saw them last week. GFR in 30s last check- did not draw blood there so we will today with labs  # Asthma S: compliant with symbicort. uses albuterol very sparingly- has not needed in some time A/P:well controlled- continue current meds  #Anemia and thrombocytopenia-Hematology 01/13/2019 said no further work-up and released patient to as needed.  We will repeat CBC today  # for back pain- I am willing to refill tramadol but does not look like he has picked up prescription from June- he will check with pharmacy and let me know  Recommended follow up:  6 months for physical Future Appointments  Date Time Provider [place]  12/08/2019 11:00 AM Sueanne Margarita, MD [facility]  01/06/2020  2:30 PM [facility] CCM PHARMACIST [facility]   Lab/Order associations:   ICD-10-CM   1. Hypertension associated with diabetes ([place])  E11.59    I10   2. Gastroesophageal reflux disease without esophagitis  K21.9   3. Hyperlipidemia associated with type 2 diabetes mellitus (HCC)   G40.10 COMPLETE METABOLIC PANEL WITH GFR   E78.5 CBC With Differential/Platelet  4. Type 2 diabetes mellitus with diabetic nephropathy, without long-term current use of insulin (HCC)  E11.21 Hemoglobin A1c    COMPLETE METABOLIC PANEL WITH GFR    CBC With Differential/Platelet    Meds ordered this encounter  Medications  . glucose blood (ACCU-CHEK AVIVA PLUS) test strip    Sig: Test blood glucose daily.    Dispense:  100 each    Refill:  12    May substitute for his meter  . atorvastatin (LIPITOR) 10 MG tablet    Sig: Take 1 tablet (10 mg total) by mouth daily.    Dispense:  90 tablet    Refill:  3     Return precautions advised.  [name], MD

## 2019-10-29 ENCOUNTER — Other Ambulatory Visit: Payer: Self-pay

## 2019-10-29 ENCOUNTER — Encounter: Payer: Self-pay | Admitting: Family Medicine

## 2019-10-29 ENCOUNTER — Ambulatory Visit (INDEPENDENT_AMBULATORY_CARE_PROVIDER_SITE_OTHER): Payer: PPO | Admitting: Family Medicine

## 2019-10-29 VITALS — BP 142/60 | HR 54 | Temp 98.0°F | Ht 69.0 in | Wt 188.2 lb

## 2019-10-29 DIAGNOSIS — E1169 Type 2 diabetes mellitus with other specified complication: Secondary | ICD-10-CM

## 2019-10-29 DIAGNOSIS — E1159 Type 2 diabetes mellitus with other circulatory complications: Secondary | ICD-10-CM

## 2019-10-29 DIAGNOSIS — I10 Essential (primary) hypertension: Secondary | ICD-10-CM

## 2019-10-29 DIAGNOSIS — E785 Hyperlipidemia, unspecified: Secondary | ICD-10-CM

## 2019-10-29 DIAGNOSIS — E1121 Type 2 diabetes mellitus with diabetic nephropathy: Secondary | ICD-10-CM | POA: Diagnosis not present

## 2019-10-29 MED ORDER — ATORVASTATIN CALCIUM 10 MG PO TABS
10.0000 mg | ORAL_TABLET | Freq: Every day | ORAL | 3 refills | Status: DC
Start: 2019-10-29 — End: 2020-10-24

## 2019-10-29 MED ORDER — ACCU-CHEK AVIVA PLUS VI STRP
ORAL_STRIP | 12 refills | Status: DC
Start: 2019-10-29 — End: 2022-03-14

## 2019-10-30 LAB — CBC WITH DIFFERENTIAL/PLATELET
Absolute Monocytes: 628 cells/uL (ref 200–950)
Basophils Absolute: 77 cells/uL (ref 0–200)
Basophils Relative: 0.9 %
Eosinophils Absolute: 989 cells/uL — ABNORMAL HIGH (ref 15–500)
Eosinophils Relative: 11.5 %
HCT: 36.7 % — ABNORMAL LOW (ref 38.5–50.0)
Hemoglobin: 12.2 g/dL — ABNORMAL LOW (ref 13.2–17.1)
Lymphs Abs: 1574 cells/uL (ref 850–3900)
MCH: 29 pg (ref 27.0–33.0)
MCHC: 33.2 g/dL (ref 32.0–36.0)
MCV: 87.4 fL (ref 80.0–100.0)
MPV: 10 fL (ref 7.5–12.5)
Monocytes Relative: 7.3 %
Neutro Abs: 5332 cells/uL (ref 1500–7800)
Neutrophils Relative %: 62 %
Platelets: 132 10*3/uL — ABNORMAL LOW (ref 140–400)
RBC: 4.2 10*6/uL (ref 4.20–5.80)
RDW: 13.7 % (ref 11.0–15.0)
Total Lymphocyte: 18.3 %
WBC: 8.6 10*3/uL (ref 3.8–10.8)

## 2019-10-30 LAB — COMPLETE METABOLIC PANEL WITH GFR
AG Ratio: 2.2 (calc) (ref 1.0–2.5)
ALT: 10 U/L (ref 9–46)
AST: 13 U/L (ref 10–35)
Albumin: 4.4 g/dL (ref 3.6–5.1)
Alkaline phosphatase (APISO): 73 U/L (ref 35–144)
BUN/Creatinine Ratio: 15 (calc) (ref 6–22)
BUN: 35 mg/dL — ABNORMAL HIGH (ref 7–25)
CO2: 28 mmol/L (ref 20–32)
Calcium: 9.1 mg/dL (ref 8.6–10.3)
Chloride: 103 mmol/L (ref 98–110)
Creat: 2.39 mg/dL — ABNORMAL HIGH (ref 0.70–1.11)
GFR, Est African American: 28 mL/min/{1.73_m2} — ABNORMAL LOW (ref >=60)
GFR, Est Non African American: 24 mL/min/{1.73_m2} — ABNORMAL LOW (ref >=60)
Globulin: 2 g/dL (calc) (ref 1.9–3.7)
Glucose, Bld: 153 mg/dL — ABNORMAL HIGH (ref 65–99)
Potassium: 4.8 mmol/L (ref 3.5–5.3)
Sodium: 139 mmol/L (ref 135–146)
Total Bilirubin: 0.7 mg/dL (ref 0.2–1.2)
Total Protein: 6.4 g/dL (ref 6.1–8.1)

## 2019-10-30 LAB — HEMOGLOBIN A1C
Hgb A1c MFr Bld: 6.6 % of total Hgb — ABNORMAL HIGH (ref <5.7)
Mean Plasma Glucose: 143 (calc)
eAG (mmol/L): 7.9 (calc)

## 2019-11-07 ENCOUNTER — Other Ambulatory Visit: Payer: Self-pay | Admitting: Family Medicine

## 2019-11-14 ENCOUNTER — Other Ambulatory Visit: Payer: Self-pay | Admitting: Family Medicine

## 2019-11-18 ENCOUNTER — Telehealth: Payer: Self-pay | Admitting: Cardiology

## 2019-11-18 NOTE — Telephone Encounter (Signed)
New Message  Pts wife was returning a call, no documentation of who called

## 2019-11-26 ENCOUNTER — Ambulatory Visit (HOSPITAL_COMMUNITY)
Admission: EM | Admit: 2019-11-26 | Discharge: 2019-11-26 | Disposition: A | Discharge status: Home / Self Care | Payer: PPO | Attending: Family Medicine | Admitting: Family Medicine

## 2019-11-26 ENCOUNTER — Encounter (HOSPITAL_COMMUNITY): Payer: Self-pay | Admitting: Emergency Medicine

## 2019-11-26 ENCOUNTER — Other Ambulatory Visit: Payer: Self-pay

## 2019-11-26 DIAGNOSIS — H18062 Stromal corneal pigmentations, left eye: Secondary | ICD-10-CM | POA: Diagnosis not present

## 2019-11-26 MED ORDER — TETRACAINE HCL 0.5 % OP SOLN
OPHTHALMIC | Status: AC
  Filled 2019-11-26: qty 4

## 2019-11-26 MED ORDER — FLUORESCEIN SODIUM 1 MG OP STRP
ORAL_STRIP | OPHTHALMIC | Status: AC
  Filled 2019-11-26: qty 1

## 2019-11-26 NOTE — Discharge Instructions (Addendum)
Follow-up with [facility] within 1 week.

## 2019-11-26 NOTE — ED Provider Notes (Signed)
[place]    CSN: [id number] Arrival date & time: 11/26/19  1458      History   Chief Complaint Chief Complaint  Patient presents with  . Fall  . Eye Injury    HPI Roy [name] is a 86 y.o. male.   HPI  Patient presents today for evaluation of an eye injury.  Patient most performing a task out in his yard when he tripped brush falling in his yard.  He sustained scrapes and abrasions to his bilateral upper extremities.  Denies any injury to the lower extremities are any pain in his arms or hitting his head.  Upon walking back to his wife noticed that his left eye was completely red and.  He denies any pain or changes to visual acuity.  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 Gershon Crane through [facility].  He called their office however Dr. Gershon Crane is out of the office and was referred to follow-up here at urgent care for evaluation.  He denies any sharp pain involving the left eye.  He denies any drainage or any sensation of pressure behind the left eye. Past Medical History:  Diagnosis Date  . Anginal pain ([place])   . Arthritis    spine  . Asthma   . CAD (coronary artery disease)    s/p PCI of the D1 with cath in 2013 showing nonobstructive disease.  CP presumed due to small vessel disease.  [name] CAP (community acquired pneumonia) 12/26/2011  . Chronic kidney disease    renal insufficiency  . COPD (chronic obstructive pulmonary disease) ([place])   . Diabetes mellitus, type 2 ([place])   . Fever 12/26/2011  . GERD (gastroesophageal reflux disease)   . Heart murmur   . Hyperlipidemia   . Hypertension   . Lyme disease    states had shakes that were thought to be parkinson's related but related to tick bite, states also RMSF  . Myocardial infarction ([place])   . Right rotator cuff tear   . Shortness of breath   . Sleep apnea    intolerant to PAP    Patient Active Problem List   Diagnosis  Date Noted  . Right rotator cuff tear 12/11/2018  . Calcific tendinitis of right shoulder 09/03/2018  . AC (acromioclavicular) arthritis 09/03/2018  . Pain of right thumb 04/08/2018  . Food impaction of esophagus 04/03/2018  . Senile purpura ([place]) 08/08/2017  . Thrombocytopenia ([place]) 02/23/2015  . GERD (gastroesophageal reflux disease) 05/03/2014  . Insomnia 05/03/2014  . Allergic rhinitis 04/21/2014  . Giardia 04/21/2014  . Mild persistent asthma 10/30/2010  . CKD (chronic kidney disease), stage III 02/22/2009  . NEPHROLITHIASIS, RECURRENT 01/21/2009  . ACTINIC KERATOSIS, HEAD 06/14/2008  . Type II diabetes mellitus with renal manifestations ([place]) 09/11/2006  . Hyperlipidemia associated with type 2 diabetes mellitus ([place]) 09/11/2006  . Hypertension associated with diabetes ([place]) 09/11/2006  . Coronary artery disease with angina pectoris ([place]) 09/11/2006  . Back pain 09/11/2006  . Sleep apnea 09/11/2006    Past Surgical History:  Procedure Laterality Date  . APPENDECTOMY    . CARDIAC CATHETERIZATION  2013  . CERVICAL LAMINECTOMY    . CHOLECYSTECTOMY    . ESOPHAGOGASTRODUODENOSCOPY N/A 04/04/2018   Procedure: ESOPHAGOGASTRODUODENOSCOPY (EGD);  Surgeon: Lin Landsman, MD;  Location: [facility] ENDOSCOPY;  Service: Gastroenterology;  Laterality: N/A;  . KIDNEY STONE SURGERY    . LEFT HEART CATH N/A 02/15/2012  Procedure: LEFT HEART CATH;  Surgeon: Larey Dresser, MD;  Location: [facility] CATH LAB;  Service: Cardiovascular;  Laterality: N/A;  . SHOULDER ARTHROSCOPY WITH ROTATOR CUFF REPAIR AND SUBACROMIAL DECOMPRESSION Right 01/20/2019   Procedure: SHOULDER ARTHROSCOPY WITH ROTATOR CUFF REPAIR AND SUBACROMIAL DECOMPRESSION, BICEPS TENOTOMY;  Surgeon: [name], MD;  Location: [place];  Service: Orthopedics;  Laterality: Right;       Home Medications    Prior to Admission medications   Medication Sig Start Date End Date Taking? Authorizing Provider    acetaminophen (TYLENOL) 325 MG tablet Take 650 mg by mouth every evening. Reported on 06/28/2015    [provider]  albuterol (PROVENTIL HFA;VENTOLIN HFA) 108 (90 Base) MCG/ACT inhaler Inhale 2 puffs into the lungs every 4 (four) hours as needed for wheezing or shortness of breath. 08/08/17   Marin Olp, MD  amLODipine (NORVASC) 2.5 MG tablet Take 1 tablet by mouth once daily 11/16/19   Marin Olp, MD  aspirin EC 81 MG tablet Take 1 tablet (81 mg total) by mouth daily. 03/11/12   Larey Dresser, MD  ASSURE COMFORT LANCETS 30G MISC 1 each by Other route daily. 12/02/13   [provider]  atorvastatin (LIPITOR) 10 MG tablet Take 1 tablet (10 mg total) by mouth daily. 10/29/19   Marin Olp, MD  calcium-vitamin D (OSCAL WITH D) 500-200 MG-UNIT per tablet Take 1 tablet by mouth daily.    [provider]  glimepiride (AMARYL) 4 MG tablet TAKE 1 & 1/2 (ONE & ONE-HALF) TABLETS BY MOUTH ONCE DAILY 09/23/19   Marin Olp, MD  glucose blood (ACCU-CHEK AVIVA PLUS) test strip Test blood glucose daily. 10/29/19   Marin Olp, MD  guaiFENesin (MUCINEX) 600 MG 12 hr tablet Take by mouth 2 (two) times daily.    [provider]  HYDROcodone-acetaminophen (NORCO) 5-325 MG tablet Take 1 tablet by mouth every 6 (six) hours as needed for moderate pain. 01/20/19   Grier Mitts, PA-C  isosorbide mononitrate (IMDUR) 30 MG 24 hr tablet Take 3 tablets by mouth once daily 09/23/19   Marin Olp, MD  lisinopril (ZESTRIL) 5 MG tablet Take 1/2 (one-half) tablet by mouth once daily 09/16/19   Marin Olp, MD  metFORMIN (GLUCOPHAGE) 500 MG tablet TAKE 1/2 (ONE-HALF) TABLET BY MOUTH ONCE DAILY WITH BREAKFAST 09/16/19   Marin Olp, MD  metoprolol succinate (TOPROL-XL) 50 MG 24 hr tablet TAKE 1 TABLET BY MOUTH ONCE DAILY (TAKE  WITH  OR  IMMEDIATELY  FOLLOWING  A  MEAL) 10/25/19   Marin Olp, MD  nitroGLYCERIN (NITROSTAT) 0.4 MG SL tablet Place  1 tablet (0.4 mg total) under the tongue every 5 (five) minutes as needed for chest pain. 04/09/17   Marin Olp, MD  Omega-3 Fatty Acids (FISH OIL PO) Take 2 capsules by mouth 2 (two) times daily.    [provider]  omeprazole (PRILOSEC) 20 MG capsule Take 1 capsule by mouth twice daily 10/16/19   Marin Olp, MD  OVER THE COUNTER MEDICATION Take 1 tablet by mouth at bedtime. Legatrim PM    [provider]  Polyethyl Glycol-Propyl Glycol (SYSTANE OP) Place 1 drop into both eyes 4 (four) times daily. Itchy/dry eye    [provider]  potassium citrate (UROCIT-K) 10 MEQ (1080 MG) SR tablet Take 10 mEq by mouth Twice daily. 01/29/12   [provider]  ranolazine (RANEXA) 500 MG 12 hr tablet Take 1  tablet (500 mg total) by mouth 2 (two) times daily. Please keep upcoming appt in October with Dr. Radford Pax before anymore refills. Thank you 10/12/19   Sueanne Margarita, MD  SYMBICORT 160-4.5 MCG/ACT inhaler Inhale 2 puffs by mouth twice daily 10/15/19   Marin Olp, MD  traMADol (ULTRAM) 50 MG tablet TAKE 1 TABLET BY MOUTH EVERY 6 HOURS AS NEEDED FOR  MODERATE  PAIN  (TWICE  PER  DAY  MAXIMUM) 08/13/19   Marin Olp, MD  traZODone (DESYREL) 150 MG tablet Take 1 tablet by mouth once daily 10/16/19   Marin Olp, MD  zinc sulfate 220 MG capsule Take 220 mg by mouth daily.    [provider]    Family History Family History  Problem Relation Age of Onset  . ALS Father   . Asthma Daughter   . Cancer Sister        breast/liver    Social History Social History   Tobacco Use  . Smoking status: Former Smoker    Packs/day: 1.50    Years: 8.00    Pack years: 12.00    Types: Cigarettes    Quit date: 03/05/1958    Years since quitting: 61.7  . Smokeless tobacco: Never Used  Vaping Use  . Vaping Use: Never used  Substance Use Topics  . Alcohol use: No    Alcohol/week: 0.0 standard drinks  . Drug use: No     Allergies   Codeine  sulfate, Crestor [rosuvastatin calcium], and Cephalexin   Review of Systems Review of Systems Pertinent negatives listed in HPI Physical Exam Triage Vital Signs ED Triage Vitals [11/26/19 1511]  Enc Vitals Group     BP      Pulse      Resp      Temp      Temp src      SpO2      Weight      Height      Head Circumference      Peak Flow      Pain Score 0     Pain Loc      Pain Edu?      Excl. in [place]?    No data found.  Updated Vital Signs BP (!) 152/60 (BP Location: Right Arm)   Pulse 63   Temp 97.8 F (36.6 C) (Oral)   Resp 20   SpO2 99%   Visual Acuity Right Eye Distance:   Left Eye Distance:   Bilateral Distance:    Right Eye Near:   Left Eye Near:    Bilateral Near:     No exam data present   Physical Exam Constitutional:      Appearance: Normal appearance. He is not toxic-appearing.  Eyes:     General: Lids are normal. No allergic shiner or visual field deficit.    Extraocular Movements:     Right eye: Normal extraocular motion and no nystagmus.     Left eye: Normal extraocular motion and no nystagmus.     Conjunctiva/sclera:     Right eye: Right conjunctiva is not injected. No chemosis or exudate.    Left eye: Left conjunctiva is injected. Hemorrhage present. No exudate.    Visual Fields: Right eye visual fields normal and left eye visual fields normal.  Neurological:     Mental Status: He is alert.     Fluorescein staining performed on left eye, tetracaine applied in left eye, patient tolerated.  No foreign body  visible.  No fluorescein uptake present on exam with lamp.  Irrigated eye with sterile solution.  Patient tolerated.  UC Treatments / Results  Labs (all labs ordered are listed, but only abnormal results are displayed) Labs Reviewed - No data to display  EKG   Radiology No results found.  Procedures Procedures (including critical care time)  Medications Ordered in UC Medications - No data to display  Initial Impression /  Assessment and Plan / UC Course  I have reviewed the triage vital signs and the nursing notes.  Pertinent labs & imaging results that were available during my care of the patient were reviewed by me and considered in my medical decision making (see chart for details).    Consulted with Dr. Coralyn Pear the ophthalmologist on-call, photos of injured eye shared. Advised if vision is at baseline and no fluorescein uptake, redness will resolve without intervention and patient is free to follow-up with Dr. Gershon Crane upon his return back to office. Fluorescein staining of left eye completed, no uptake occurred. Vision is at patient's baseline . Red flags discussed with patient and spouse. Patient will contact Dr. Gershon Crane on Monday to schedule a follow-up if redness doesn't resolve or pain in eye develops. Final Clinical Impressions(s) / UC Diagnoses   Final diagnoses:  Corneal hemorrhage of left eye     Discharge Instructions     Follow-up with [facility] within 1 week.    ED Prescriptions    None     PDMP not reviewed this encounter.   Scot Jun, FNP 11/27/19 1538

## 2019-11-26 NOTE — ED Triage Notes (Addendum)
[place]  Patient stumbled on a tree stump and fell.  Patient not sure if he hit himself in eye with fist or what.  No loc.  Patient says he is able to see, no vision changes.  Left eye/scelera is red and feels like something is in eye.  pearrl . Scrapes to arms, covered with bandaid.    After falling, he got up and finished fixing the fence to the pasture  Called dr Inocencio Homes office, but he is out of town.    Placed patient in treatment room for physician evaluation.

## 2019-11-30 DIAGNOSIS — H1132 Conjunctival hemorrhage, left eye: Secondary | ICD-10-CM | POA: Diagnosis not present

## 2019-12-08 ENCOUNTER — Ambulatory Visit: Payer: PPO | Admitting: Cardiology

## 2019-12-12 ENCOUNTER — Other Ambulatory Visit: Payer: Self-pay | Admitting: Family Medicine

## 2019-12-20 ENCOUNTER — Other Ambulatory Visit: Payer: Self-pay | Admitting: Family Medicine

## 2019-12-20 DIAGNOSIS — N183 Chronic kidney disease, stage 3 unspecified: Secondary | ICD-10-CM

## 2019-12-20 DIAGNOSIS — E1122 Type 2 diabetes mellitus with diabetic chronic kidney disease: Secondary | ICD-10-CM

## 2019-12-21 ENCOUNTER — Ambulatory Visit: Payer: PPO | Admitting: Cardiology

## 2019-12-21 ENCOUNTER — Encounter: Payer: Self-pay | Admitting: Cardiology

## 2019-12-21 ENCOUNTER — Other Ambulatory Visit: Payer: Self-pay

## 2019-12-21 VITALS — BP 110/70 | HR 62 | Ht 69.0 in | Wt 192.0 lb

## 2019-12-21 DIAGNOSIS — E1169 Type 2 diabetes mellitus with other specified complication: Secondary | ICD-10-CM

## 2019-12-21 DIAGNOSIS — I25119 Atherosclerotic heart disease of native coronary artery with unspecified angina pectoris: Secondary | ICD-10-CM

## 2019-12-21 DIAGNOSIS — E785 Hyperlipidemia, unspecified: Secondary | ICD-10-CM

## 2019-12-21 DIAGNOSIS — E1159 Type 2 diabetes mellitus with other circulatory complications: Secondary | ICD-10-CM

## 2019-12-21 DIAGNOSIS — N1832 Chronic kidney disease, stage 3b: Secondary | ICD-10-CM | POA: Diagnosis not present

## 2019-12-21 DIAGNOSIS — E1121 Type 2 diabetes mellitus with diabetic nephropathy: Secondary | ICD-10-CM | POA: Diagnosis not present

## 2019-12-21 DIAGNOSIS — G4733 Obstructive sleep apnea (adult) (pediatric): Secondary | ICD-10-CM | POA: Diagnosis not present

## 2019-12-21 DIAGNOSIS — I152 Hypertension secondary to endocrine disorders: Secondary | ICD-10-CM

## 2019-12-21 NOTE — Progress Notes (Signed)
Date:  12/21/2019   ID:  Roy [name], DOB 06/21/1932, MRN [id number]  PCP:  Marin Olp, MD  Cardiologist: Fransico Him, MD Electrophysiologist:  None   Chief Complaint:  CAD, HTN, OSA  History of Present Illness:    Roy [name] is a 86 y.o. male with a hx of CAD s/p PTCA D1 in 1999 with repeat cath 12/2011 with nonobstructive dz (CP felt secondary to small vessel dz), CKD, diabetes, HTN,OSA intolerant to CPAP and hyperlipidemia.    He is here today for followup and is doing well.  He denies any chest pain or pressure, SOB, DOE, PND, orthopnea, LE edema, dizziness, palpitations or syncope. he is compliant with his meds and is tolerating meds with no SE.    Prior CV studies:   The following studies were reviewed today:  none  Past Medical History:  Diagnosis Date   Anginal pain ([place])    Arthritis    spine   Asthma    CAD (coronary artery disease)    s/p PCI of the D1 with cath in 2013 showing nonobstructive disease.  CP presumed due to small vessel disease.   CAP (community acquired pneumonia) 12/26/2011   Chronic kidney disease    renal insufficiency   COPD (chronic obstructive pulmonary disease) (HCC)    Diabetes mellitus, type 2 ([place])    Fever 12/26/2011   GERD (gastroesophageal reflux disease)    Heart murmur    Hyperlipidemia    Hypertension    Lyme disease    states had shakes that were thought to be parkinson's related but related to tick bite, states also RMSF   Myocardial infarction [facility])    Right rotator cuff tear    Shortness of breath    Sleep apnea    intolerant to PAP   Past Surgical History:  Procedure Laterality Date   APPENDECTOMY     CARDIAC CATHETERIZATION  2013   CERVICAL LAMINECTOMY     CHOLECYSTECTOMY     ESOPHAGOGASTRODUODENOSCOPY N/A 04/04/2018   Procedure: ESOPHAGOGASTRODUODENOSCOPY (EGD);  Surgeon: Lin Landsman, MD;  Location: [facility] ENDOSCOPY;  Service: Gastroenterology;  Laterality: N/A;    KIDNEY STONE SURGERY     LEFT HEART CATH N/A 02/15/2012   Procedure: LEFT HEART CATH;  Surgeon: Larey Dresser, MD;  Location: [facility] CATH LAB;  Service: Cardiovascular;  Laterality: N/A;   SHOULDER ARTHROSCOPY WITH ROTATOR CUFF REPAIR AND SUBACROMIAL DECOMPRESSION Right 01/20/2019   Procedure: SHOULDER ARTHROSCOPY WITH ROTATOR CUFF REPAIR AND SUBACROMIAL DECOMPRESSION, BICEPS TENOTOMY;  Surgeon: [name], MD;  Location: [place];  Service: Orthopedics;  Laterality: Right;     Current Meds  Medication Sig   acetaminophen (TYLENOL) 325 MG tablet Take 650 mg by mouth every evening. Reported on 06/28/2015   albuterol (PROVENTIL HFA;VENTOLIN HFA) 108 (90 Base) MCG/ACT inhaler Inhale 2 puffs into the lungs every 4 (four) hours as needed for wheezing or shortness of breath.   amLODipine (NORVASC) 2.5 MG tablet Take 1 tablet by mouth once daily   aspirin EC 81 MG tablet Take 1 tablet (81 mg total) by mouth daily.   ASSURE COMFORT LANCETS 30G MISC 1 each by Other route daily.   atorvastatin (LIPITOR) 10 MG tablet Take 1 tablet (10 mg total) by mouth daily.   calcium-vitamin D (OSCAL WITH D) 500-200 MG-UNIT per tablet Take 1 tablet by mouth daily.   glimepiride (AMARYL) 4 MG tablet TAKE 1 & 1/2 (ONE & ONE-HALF) TABLETS BY MOUTH ONCE DAILY  glucose blood (ACCU-CHEK AVIVA PLUS) test strip Test blood glucose daily.   guaiFENesin (MUCINEX) 600 MG 12 hr tablet Take by mouth 2 (two) times daily.   HYDROcodone-acetaminophen (NORCO) 5-325 MG tablet Take 1 tablet by mouth every 6 (six) hours as needed for moderate pain.   isosorbide mononitrate (IMDUR) 30 MG 24 hr tablet Take 3 tablets by mouth once daily   lisinopril (ZESTRIL) 5 MG tablet Take 1/2 (one-half) tablet by mouth once daily   metFORMIN (GLUCOPHAGE) 500 MG tablet TAKE 1/2 (ONE-HALF) TABLET BY MOUTH ONCE DAILY WITH BREAKFAST   metoprolol succinate (TOPROL-XL) 50 MG 24 hr tablet TAKE 1 TABLET BY MOUTH ONCE DAILY  (TAKE  WITH  OR  IMMEDIATELY  FOLLOWING  A  MEAL)   nitroGLYCERIN (NITROSTAT) 0.4 MG SL tablet Place 1 tablet (0.4 mg total) under the tongue every 5 (five) minutes as needed for chest pain.   Omega-3 Fatty Acids (FISH OIL PO) Take 2 capsules by mouth 2 (two) times daily.   omeprazole (PRILOSEC) 20 MG capsule Take 1 capsule by mouth twice daily   OVER THE COUNTER MEDICATION Take 1 tablet by mouth at bedtime. Legatrim PM   Polyethyl Glycol-Propyl Glycol (SYSTANE OP) Place 1 drop into both eyes 4 (four) times daily. Itchy/dry eye   potassium citrate (UROCIT-K) 10 MEQ (1080 MG) SR tablet Take 10 mEq by mouth Twice daily.   ranolazine (RANEXA) 500 MG 12 hr tablet Take 1 tablet (500 mg total) by mouth 2 (two) times daily. Please keep upcoming appt in October with Dr. Radford Pax before anymore refills. Thank you   SYMBICORT 160-4.5 MCG/ACT inhaler Inhale 2 puffs by mouth twice daily   traMADol (ULTRAM) 50 MG tablet TAKE 1 TABLET BY MOUTH EVERY 6 HOURS AS NEEDED FOR  MODERATE  PAIN  (TWICE  PER  DAY  MAXIMUM)   traZODone (DESYREL) 150 MG tablet Take 1 tablet by mouth once daily   zinc sulfate 220 MG capsule Take 220 mg by mouth daily.     Allergies:   Codeine sulfate, Crestor [rosuvastatin calcium], and Cephalexin   Social History   Tobacco Use   Smoking status: Former Smoker    Packs/day: 1.50    Years: 8.00    Pack years: 12.00    Types: Cigarettes    Quit date: 03/05/1958    Years since quitting: 61.8   Smokeless tobacco: Never Used  Vaping Use   Vaping Use: Never used  Substance Use Topics   Alcohol use: No    Alcohol/week: 0.0 standard drinks   Drug use: No     Family Hx: The patient's family history includes ALS in his father; Asthma in his daughter; Cancer in his sister.  ROS:   Please see the history of present illness.     All other systems reviewed and are negative.   Labs/Other Tests and Data Reviewed:    Recent Labs: 10/29/2019: ALT 10; BUN 35; Creat 2.39;  Hemoglobin 12.2; Platelets 132; Potassium 4.8; Sodium 139   Recent Lipid Panel Lab Results  Component Value Date/Time   CHOL 144 04/28/2019 11:24 AM   CHOL 119 05/09/2017 08:26 AM   TRIG 180.0 (H) 04/28/2019 11:24 AM   TRIG 205 (HH) 12/17/2005 10:45 AM   HDL 39.90 04/28/2019 11:24 AM   HDL 35 (L) 05/09/2017 08:26 AM   CHOLHDL 4 04/28/2019 11:24 AM   LDLCALC 68 04/28/2019 11:24 AM   LDLCALC 55 05/09/2017 08:26 AM   LDLDIRECT 55.0 12/30/2018 10:00 AM  Wt Readings from Last 3 Encounters:  12/21/19 192 lb (87.1 kg)  10/29/19 188 lb 3.2 oz (85.4 kg)  08/08/19 180 lb (81.6 kg)     Objective:    Vital Signs:  Ht 5\' 9"  (1.753 m)    Wt 192 lb (87.1 kg)    BMI 28.35 kg/m    GEN: Well nourished, well developed in no acute distress HEENT: Normal NECK: No JVD; No carotid bruits LYMPHATICS: No lymphadenopathy CARDIAC:RRR, no murmurs, rubs, gallops RESPIRATORY:  Clear to auscultation without rales, wheezing or rhonchi  ABDOMEN: Soft, non-tender, non-distended MUSCULOSKELETAL:  No edema; No deformity  SKIN: Warm and dry NEUROLOGIC:  Alert and oriented x 3 PSYCHIATRIC:  Normal affect   ASSESSMENT & PLAN:    1.  ASCAD  - s/p PTCA D1 in 1999 with repeat cath 12/2011 with nonobstructive dz - CP felt secondary to small vessel dz.   -he denies any anginal symptoms.  -Continue ASA 81mg  daily, Ranexa 500mg  BID, Toprol XL 50mg  daily, statin and Imdur 30mg  daily.    2.  HTN  -BP controlled on exam today -continue Lisinopril 2.55mg  daily, amlodipine 2.5mg  daily and Toprol XL 50mg  daily  -SCr was 2.39 in Aug 2021  3.  CKD stage 3b - this is followed by his PCP and his baseline creatinine is around 1.9. but bumped to 2.39 in Aug 2021  4.  Hyperlipidemia  -his LDL goal is < 70.   -His last LDL was 68 in Feb 2021.   -He will continue on  Atorvastatin 10mg  daily.  -repeat FLp and ALT in 04/2020  5.  OSA -he stopped his PAP.  He says he sleeps better without it.   6.  Type 2 DM - this  is followed by his PCP.   -He will continue on metformin 250mg  daily, glimepiride 4mg  daily.  Medication Adjustments/Labs and Tests Ordered: Current medicines are reviewed at length with the patient today.  Concerns regarding medicines are outlined above.  Tests Ordered: No orders of the defined types were placed in this encounter.  Medication Changes: No orders of the defined types were placed in this encounter.   Disposition:  Follow up in 1 year(s)  Signed, Fransico Him, MD  12/21/2019 11:20 AM    [facility]

## 2019-12-21 NOTE — Patient Instructions (Signed)
Medication Instructions:  Your physician recommends that you continue on your current medications as directed. Please refer to the Current Medication list given to you today.  *If you need a refill on your cardiac medications before your next appointment, please call your pharmacy*  Follow-Up: At [facility], you and your health needs are our priority.  As part of our continuing mission to provide you with exceptional heart care, we have created designated Provider Care Teams.  These Care Teams include your primary Cardiologist (physician) and Advanced Practice Providers (APPs -  Physician Assistants and Nurse Practitioners) who all work together to provide you with the care you need, when you need it.  We recommend signing up for the patient portal called "MyChart".  Sign up information is provided on this After Visit Summary.  MyChart is used to connect with patients for Virtual Visits (Telemedicine).  Patients are able to view lab/test results, encounter notes, upcoming appointments, etc.  Non-urgent messages can be sent to your provider as well.   To learn more about what you can do with MyChart, go to [email or web address].    Your next appointment:   1 year(s)  The format for your next appointment:   In Person  Provider:   You may see Fransico Him, MD or one of the following Advanced Practice Providers on your designated Care Team:    Melina Copa, [place]

## 2019-12-22 DIAGNOSIS — H401131 Primary open-angle glaucoma, bilateral, mild stage: Secondary | ICD-10-CM | POA: Diagnosis not present

## 2019-12-26 ENCOUNTER — Other Ambulatory Visit: Payer: Self-pay | Admitting: Family Medicine

## 2019-12-28 ENCOUNTER — Ambulatory Visit (INDEPENDENT_AMBULATORY_CARE_PROVIDER_SITE_OTHER): Payer: PPO

## 2019-12-28 DIAGNOSIS — Z0000 Encounter for general adult medical examination without abnormal findings: Secondary | ICD-10-CM

## 2019-12-28 NOTE — Patient Instructions (Addendum)
Roy [name] , Thank you for taking time to come for your Medicare Wellness Visit. I appreciate your ongoing commitment to your health goals. Please review the following plan we discussed and let me know if I can assist you in the future.   Screening recommendations/referrals: Colonoscopy: No longer required  Recommended yearly ophthalmology/optometry visit for glaucoma screening and checkup Recommended yearly dental visit for hygiene and checkup  Vaccinations: Influenza vaccine: Done 12/25/19 Pneumococcal vaccine: Up to date Tdap vaccine: Done 04/09/17 Shingles vaccine: Completed 7/28 & 12/22/18   Covid-19: Completed 1/10 & 04/26/19  Advanced directives: Advance directive discussed with you today. Even though you declined this today please call our office should you change your mind and we can give you the proper paperwork for you to fill out.  Conditions/risks identified: Live to 89  Next appointment: Follow up in one year for your annual wellness visit.   Preventive Care 75 Years and Older, Male Preventive care refers to lifestyle choices and visits with your health care provider that can promote health and wellness. What does preventive care include?  A yearly physical exam. This is also called an annual well check.  Dental exams once or twice a year.  Routine eye exams. Ask your health care provider how often you should have your eyes checked.  Personal lifestyle choices, including:  Daily care of your teeth and gums.  Regular physical activity.  Eating a healthy diet.  Avoiding tobacco and drug use.  Limiting alcohol use.  Practicing safe sex.  Taking low doses of aspirin every day.  Taking vitamin and mineral supplements as recommended by your health care provider. What happens during an annual well check? The services and screenings done by your health care provider during your annual well check will depend on your age, overall health, lifestyle risk factors, and  family history of disease. Counseling  Your health care provider may ask you questions about your:  Alcohol use.  Tobacco use.  Drug use.  Emotional well-being.  Home and relationship well-being.  Sexual activity.  Eating habits.  History of falls.  Memory and ability to understand (cognition).  Work and work Statistician. Screening  You may have the following tests or measurements:  Height, weight, and BMI.  Blood pressure.  Lipid and cholesterol levels. These may be checked every 5 years, or more frequently if you are over 53 years old.  Skin check.  Lung cancer screening. You may have this screening every year starting at age 95 if you have a 30-pack-year history of smoking and currently smoke or have quit within the past 15 years.  Fecal occult blood test (FOBT) of the stool. You may have this test every year starting at age 40.  Flexible sigmoidoscopy or colonoscopy. You may have a sigmoidoscopy every 5 years or a colonoscopy every 10 years starting at age 31.  Prostate cancer screening. Recommendations will vary depending on your family history and other risks.  Hepatitis C blood test.  Hepatitis B blood test.  Sexually transmitted disease (STD) testing.  Diabetes screening. This is done by checking your blood sugar (glucose) after you have not eaten for a while (fasting). You may have this done every 1-3 years.  Abdominal aortic aneurysm (AAA) screening. You may need this if you are a current or former smoker.  Osteoporosis. You may be screened starting at age 73 if you are at high risk. Talk with your health care provider about your test results, treatment options, and if necessary, the  need for more tests. Vaccines  Your health care provider may recommend certain vaccines, such as:  Influenza vaccine. This is recommended every year.  Tetanus, diphtheria, and acellular pertussis (Tdap, Td) vaccine. You may need a Td booster every 10 years.  Zoster  vaccine. You may need this after age 68.  Pneumococcal 13-valent conjugate (PCV13) vaccine. One dose is recommended after age 39.  Pneumococcal polysaccharide (PPSV23) vaccine. One dose is recommended after age 74. Talk to your health care provider about which screenings and vaccines you need and how often you need them. This information is not intended to replace advice given to you by your health care provider. Make sure you discuss any questions you have with your health care provider. Document Released: 03/18/2015 Document Revised: 11/09/2015 Document Reviewed: 12/21/2014 Elsevier Interactive Patient Education  2017 [place] Prevention in the Home Falls can cause injuries. They can happen to people of all ages. There are many things you can do to make your home safe and to help prevent falls. What can I do on the outside of my home?  Regularly fix the edges of walkways and driveways and fix any cracks.  Remove anything that might make you trip as you walk through a door, such as a raised step or threshold.  Trim any bushes or trees on the path to your home.  Use bright outdoor lighting.  Clear any walking paths of anything that might make someone trip, such as rocks or tools.  Regularly check to see if handrails are loose or broken. Make sure that both sides of any steps have handrails.  Any raised decks and porches should have guardrails on the edges.  Have any leaves, snow, or ice cleared regularly.  Use sand or salt on walking paths during winter.  Clean up any spills in your garage right away. This includes oil or grease spills. What can I do in the bathroom?  Use night lights.  Install grab bars by the toilet and in the tub and shower. Do not use towel bars as grab bars.  Use non-skid mats or decals in the tub or shower.  If you need to sit down in the shower, use a plastic, non-slip stool.  Keep the floor dry. Clean up any water that spills on the  floor as soon as it happens.  Remove soap buildup in the tub or shower regularly.  Attach bath mats securely with double-sided non-slip rug tape.  Do not have throw rugs and other things on the floor that can make you trip. What can I do in the bedroom?  Use night lights.  Make sure that you have a light by your bed that is easy to reach.  Do not use any sheets or blankets that are too big for your bed. They should not hang down onto the floor.  Have a firm chair that has side arms. You can use this for support while you get dressed.  Do not have throw rugs and other things on the floor that can make you trip. What can I do in the kitchen?  Clean up any spills right away.  Avoid walking on wet floors.  Keep items that you use a lot in easy-to-reach places.  If you need to reach something above you, use a strong step stool that has a grab bar.  Keep electrical cords out of the way.  Do not use floor polish or wax that makes floors slippery. If you must use wax,  use non-skid floor wax.  Do not have throw rugs and other things on the floor that can make you trip. What can I do with my stairs?  Do not leave any items on the stairs.  Make sure that there are handrails on both sides of the stairs and use them. Fix handrails that are broken or loose. Make sure that handrails are as long as the stairways.  Check any carpeting to make sure that it is firmly attached to the stairs. Fix any carpet that is loose or worn.  Avoid having throw rugs at the top or bottom of the stairs. If you do have throw rugs, attach them to the floor with carpet tape.  Make sure that you have a light switch at the top of the stairs and the bottom of the stairs. If you do not have them, ask someone to add them for you. What else can I do to help prevent falls?  Wear shoes that:  Do not have high heels.  Have rubber bottoms.  Are comfortable and fit you well.  Are closed at the toe. Do not wear  sandals.  If you use a stepladder:  Make sure that it is fully opened. Do not climb a closed stepladder.  Make sure that both sides of the stepladder are locked into place.  Ask someone to hold it for you, if possible.  Clearly mark and make sure that you can see:  Any grab bars or handrails.  First and last steps.  Where the edge of each step is.  Use tools that help you move around (mobility aids) if they are needed. These include:  Canes.  Walkers.  Scooters.  Crutches.  Turn on the lights when you go into a dark area. Replace any light bulbs as soon as they burn out.  Set up your furniture so you have a clear path. Avoid moving your furniture around.  If any of your floors are uneven, fix them.  If there are any pets around you, be aware of where they are.  Review your medicines with your doctor. Some medicines can make you feel dizzy. This can increase your chance of falling. Ask your doctor what other things that you can do to help prevent falls. This information is not intended to replace advice given to you by your health care provider. Make sure you discuss any questions you have with your health care provider. Document Released: 12/16/2008 Document Revised: 07/28/2015 Document Reviewed: 03/26/2014 Elsevier Interactive Patient Education  2017 [employer].

## 2019-12-28 NOTE — Progress Notes (Signed)
Virtual Visit via Telephone Note  I connected with  Roy [name] on 12/28/19 at  3:15 PM EDT by telephone and verified that I am speaking with the correct person using two identifiers.  Medicare Annual Wellness visit completed telephonically due to Covid-19 pandemic.   Persons participating in this call: This Health Coach and this patient along with wife polly  Location: Patient: Home Provider: Office   I discussed the limitations, risks, security and privacy concerns of performing an evaluation and management service by telephone and the availability of in person appointments. The patient expressed understanding and agreed to proceed.  Unable to perform video visit due to video visit attempted and failed and/or patient does not have video capability.   Some vital signs may be absent or patient reported.   Willette Brace, LPN    Subjective:   Roy [name] is a 86 y.o. male who presents for Medicare Annual/Subsequent preventive examination.  Review of Systems     Cardiac Risk Factors include: male gender;hypertension;dyslipidemia     Objective:    There were no vitals filed for this visit. There is no height or weight on file to calculate BMI.  Advanced Directives 12/28/2019 01/20/2019 01/14/2019 11/27/2018 04/04/2018 04/04/2018 04/03/2018  Does Patient Have a Medical Advance Directive? No No No No - No No  Would patient like information on creating a medical advance directive? No - Patient declined No - Patient declined No - Patient declined Yes (MAU/Ambulatory/Procedural Areas - Information given) Yes (Inpatient - patient requests chaplain consult to create a medical advance directive) Yes (Inpatient - patient requests chaplain consult to create a medical advance directive) No - Patient declined  Pre-existing out of facility DNR order (yellow form or pink MOST form) - - - - - - -    Current Medications (verified) Outpatient Encounter Medications as of 12/28/2019    Medication Sig  . acetaminophen (TYLENOL) 325 MG tablet Take 650 mg by mouth every evening. Reported on 06/28/2015  . albuterol (PROVENTIL HFA;VENTOLIN HFA) 108 (90 Base) MCG/ACT inhaler Inhale 2 puffs into the lungs every 4 (four) hours as needed for wheezing or shortness of breath.  [name] amLODipine (NORVASC) 2.5 MG tablet Take 1 tablet by mouth once daily  . aspirin EC 81 MG tablet Take 1 tablet (81 mg total) by mouth daily.  . ASSURE COMFORT LANCETS 30G MISC 1 each by Other route daily.  [name] atorvastatin (LIPITOR) 10 MG tablet Take 1 tablet (10 mg total) by mouth daily.  . calcium-vitamin D (OSCAL WITH D) 500-200 MG-UNIT per tablet Take 1 tablet by mouth daily.  [name] glimepiride (AMARYL) 4 MG tablet TAKE 1 & 1/2 (ONE & ONE-HALF) TABLETS BY MOUTH ONCE DAILY  . glucose blood (ACCU-CHEK AVIVA PLUS) test strip Test blood glucose daily.  [name] guaiFENesin (MUCINEX) 600 MG 12 hr tablet Take by mouth 2 (two) times daily.  [name] HYDROcodone-acetaminophen (NORCO) 5-325 MG tablet Take 1 tablet by mouth every 6 (six) hours as needed for moderate pain.  . isosorbide mononitrate (IMDUR) 30 MG 24 hr tablet Take 3 tablets by mouth once daily  . latanoprost (XALATAN) 0.005 % ophthalmic solution INSTILL 1 DROP INTO EACH EYE NIGHTLY  . lisinopril (ZESTRIL) 5 MG tablet Take 1/2 (one-half) tablet by mouth once daily  . metFORMIN (GLUCOPHAGE) 500 MG tablet TAKE 1/2 (ONE-HALF) TABLET BY MOUTH ONCE DAILY WITH BREAKFAST  . metoprolol succinate (TOPROL-XL) 50 MG 24 hr tablet TAKE 1 TABLET BY MOUTH ONCE DAILY (TAKE  WITH  OR  IMMEDIATELY  FOLLOWING  A  MEAL)  . nitroGLYCERIN (NITROSTAT) 0.4 MG SL tablet Place 1 tablet (0.4 mg total) under the tongue every 5 (five) minutes as needed for chest pain.  . Omega-3 Fatty Acids (FISH OIL PO) Take 2 capsules by mouth 2 (two) times daily.  [name] omeprazole (PRILOSEC) 20 MG capsule Take 1 capsule by mouth twice daily  . Polyethyl Glycol-Propyl Glycol (SYSTANE OP) Place 1 drop into both eyes 4  (four) times daily. Itchy/dry eye  . potassium citrate (UROCIT-K) 10 MEQ (1080 MG) SR tablet Take 10 mEq by mouth Twice daily.  . ranolazine (RANEXA) 500 MG 12 hr tablet Take 1 tablet (500 mg total) by mouth 2 (two) times daily. Please keep upcoming appt in October with Dr. Radford Pax before anymore refills. Thank you  . SYMBICORT 160-4.5 MCG/ACT inhaler Inhale 2 puffs by mouth twice daily  . traMADol (ULTRAM) 50 MG tablet TAKE 1 TABLET BY MOUTH EVERY 6 HOURS AS NEEDED FOR  MODERATE  PAIN  (TWICE  PER  DAY  MAXIMUM)  . traZODone (DESYREL) 150 MG tablet Take 1 tablet by mouth once daily  . zinc sulfate 220 MG capsule Take 220 mg by mouth daily.  . [DISCONTINUED] OVER THE COUNTER MEDICATION Take 1 tablet by mouth at bedtime. Legatrim PM (Patient not taking: Reported on 12/28/2019)   No facility-administered encounter medications on file as of 12/28/2019.    Allergies (verified) Codeine sulfate, Crestor [rosuvastatin calcium], and Cephalexin   History: Past Medical History:  Diagnosis Date  . Anginal pain ([place])   . Arthritis    spine  . Asthma   . CAD (coronary artery disease)    s/p PCI of the D1 with cath in 2013 showing nonobstructive disease.  CP presumed due to small vessel disease.  [name] CAP (community acquired pneumonia) 12/26/2011  . Chronic kidney disease    renal insufficiency  . COPD (chronic obstructive pulmonary disease) ([place])   . Diabetes mellitus, type 2 ([place])   . Fever 12/26/2011  . GERD (gastroesophageal reflux disease)   . Heart murmur   . Hyperlipidemia   . Hypertension   . Lyme disease    states had shakes that were thought to be parkinson's related but related to tick bite, states also RMSF  . Myocardial infarction ([place])   . Right rotator cuff tear   . Shortness of breath   . Sleep apnea    intolerant to PAP   Past Surgical History:  Procedure Laterality Date  . APPENDECTOMY    . CARDIAC CATHETERIZATION  2013  . CERVICAL LAMINECTOMY    . CHOLECYSTECTOMY    .  ESOPHAGOGASTRODUODENOSCOPY N/A 04/04/2018   Procedure: ESOPHAGOGASTRODUODENOSCOPY (EGD);  Surgeon: Lin Landsman, MD;  Location: [facility] ENDOSCOPY;  Service: Gastroenterology;  Laterality: N/A;  . KIDNEY STONE SURGERY    . LEFT HEART CATH N/A 02/15/2012   Procedure: LEFT HEART CATH;  Surgeon: Larey Dresser, MD;  [facility];  Service: Cardiovascular;  Laterality: N/A;  . SHOULDER ARTHROSCOPY WITH ROTATOR CUFF REPAIR AND SUBACROMIAL DECOMPRESSION Right 01/20/2019   Procedure: SHOULDER ARTHROSCOPY WITH ROTATOR CUFF REPAIR AND SUBACROMIAL DECOMPRESSION, BICEPS TENOTOMY;  Surgeon: [name], MD;  Location: [place];  Service: Orthopedics;  Laterality: Right;   Family History  Problem Relation Age of Onset  . ALS Father   . Asthma Daughter   . Cancer Sister        breast/liver   Social History   Socioeconomic History  .  Marital status: Married    Spouse name: Not on file  . Number of children: Not on file  . Years of education: Not on file  . Highest education level: Not on file  Occupational History  . Not on file  Tobacco Use  . Smoking status: Former Smoker    Packs/day: 1.50    Years: 8.00    Pack years: 12.00    Types: Cigarettes    Quit date: 03/05/1958    Years since quitting: 61.8  . Smokeless tobacco: Never Used  Vaping Use  . Vaping Use: Never used  Substance and Sexual Activity  . Alcohol use: No    Alcohol/week: 0.0 standard drinks  . Drug use: No  . Sexual activity: Not Currently  Other Topics Concern  . Not on file  Social History Narrative   Married (wife patient of Dr. Yong Channel). 3 children. 4 grandchildren. 3 greatgrandchildren.       Retired from [employer]: rabbit hunting, time with dogs   Social Determinants of Radio broadcast assistant Strain: Low Risk   . Difficulty of Paying Living Expenses: Not hard at all  Food Insecurity: No Food Insecurity  . Worried About Sales executive in the Last Year: Never true  . Ran Out of Food in the Last Year: Never true  Transportation Needs: No Transportation Needs  . Lack of Transportation (Medical): No  . Lack of Transportation (Non-Medical): No  Physical Activity: Sufficiently Active  . Days of Exercise per Week: 7 days  . Minutes of Exercise per Session: 60 min  Stress: No Stress Concern Present  . Feeling of Stress : Not at all  Social Connections: Moderately Integrated  . Frequency of Communication with Friends and Family: More than three times a week  . Frequency of Social Gatherings with Friends and Family: Twice a week  . Attends Religious Services: More than 4 times per year  . Active Member of Clubs or Organizations: No  . Attends Archivist Meetings: Never  . Marital Status: Married    Tobacco Counseling Counseling given: Not Answered   Clinical Intake:  Pre-visit preparation completed: Yes  Pain : No/denies pain (fingers and back ache at times)     BMI - recorded: 28.35 Nutritional Status: BMI 25 -29 Overweight Nutritional Risks: None Diabetes: Yes  How often do you need to have someone help you when you read instructions, pamphlets, or other written materials from your doctor or pharmacy?: 1 - Never  Diabetic?Yes  Interpreter Needed?: No  Information entered by :: Charlott Rakes, LPN   Activities of Daily Living In your present state of health, do you have any difficulty performing the following activities: 12/28/2019 01/20/2019  Hearing? N N  Vision? N N  Difficulty concentrating or making decisions? Y N  Comment memeory at times -  Walking or climbing stairs? N N  Dressing or bathing? N N  Doing errands, shopping? N -  Preparing Food and eating ? N -  Using the Toilet? N -  In the past six months, have you accidently leaked urine? N -  Do you have problems with loss of bowel control? N -  Managing your Medications? N -  Managing your Finances? N -  Housekeeping or  managing your Housekeeping? N -  Some recent data might be hidden    Patient Care Team: Marin Olp, MD as PCP - General (Family Medicine) Fransico Him  R, MD as PCP - Cardiology (Cardiology) Sueanne Margarita, MD as Consulting Physician (Cardiology) Juanito Doom, MD as Consulting Physician (Pulmonary Disease) [facility], Michae Kava, MD as Attending Physician Harriett Sine, MD as Consulting Physician (Dermatology) Rutherford Guys, MD as Consulting Physician (Ophthalmology) Lyndal Pulley, DO as Consulting Physician (Sports Medicine) Madelin Rear, [facility] as Pharmacist (Pharmacist)  Indicate any recent Medical Services you may have received from other than [facility] providers in the past year (date may be approximate).     Assessment:   This is a routine wellness examination for Tylee.  Hearing/Vision screen  Hearing Screening   125Hz  250Hz  500Hz  1000Hz  2000Hz  3000Hz  4000Hz  6000Hz  8000Hz   Right ear:           Left ear:           Comments: Pt denies any hearing difficulty at this time  Vision Screening Comments: Follows up with Dr Gershon Crane for annual eye exams  Dietary issues and exercise activities discussed: Current Exercise Habits: Home exercise routine, Type of exercise: walking (working in garden and animals), Time (Minutes): > 60, Frequency (Times/Week): 7, Weekly Exercise (Minutes/Week): 0  Goals    . patient     Keep maintaining and staying active     . patient     Continue to stay healthy; Keep walking 3 to 4 times a week     . Patient Stated     Living to 100      Depression Screen PHQ 2/9 Scores 12/28/2019 04/28/2019 01/27/2019 11/27/2018 04/17/2018 12/09/2017 02/07/2017  PHQ - 2 Score 0 0 3 0 0 0 0  PHQ- 9 Score - 0 9 - - - -    Fall Risk Fall Risk  12/28/2019 10/29/2019 04/28/2019 01/27/2019 11/27/2018  Falls in the past year? 1 0 0 0 0  Comment - - - - -  Number falls in past yr: 1 0 0 0 0  Comment - - - - -  Injury with Fall? 1 0 0 0 0  Comment  hurt eye - - - -  Follow up Falls prevention discussed - - - Education provided;Falls evaluation completed;Falls prevention discussed    Any stairs in or around the home? Yes  If so, are there any without handrails? No  Home free of loose throw rugs in walkways, pet beds, electrical cords, etc? Yes  Adequate lighting in your home to reduce risk of falls? Yes   ASSISTIVE DEVICES UTILIZED TO PREVENT FALLS:  Life alert? No  Use of a cane, walker or w/c? No  Grab bars in the bathroom? No  Shower chair or bench in shower? No  Elevated toilet seat or a handicapped toilet? No   TIMED UP AND GO:  Was the test performed? No .     Cognitive Function: MMSE - Mini Mental State Exam 02/07/2017 02/03/2016  Not completed: (No Data) (No Data)     6CIT Screen 12/28/2019 11/27/2018  What Year? 0 points 0 points  What month? 0 points 0 points  What time? - 0 points  Count back from 20 0 points 0 points  Months in reverse 0 points 0 points  Repeat phrase 6 points 0 points  Total Score - 0    Immunizations Immunization History  Administered Date(s) Administered  . Influenza Split 12/04/2010, 01/23/2012  . Influenza Whole 12/26/2006, 11/28/2007, 11/24/2008, 12/30/2009  . Influenza, High Dose Seasonal PF 01/13/2013, 12/10/2013, 11/21/2017, 11/19/2018  . Influenza-Unspecified 10/31/2015, 11/21/2016, 12/25/2019  . Moderna SARS-COVID-2 Vaccination 03/15/2019, 04/26/2019  .  Pneumococcal Conjugate-13 07/27/2014  . Pneumococcal Polysaccharide-23 06/27/2005  . Td 06/27/2005, 04/09/2017  . Zoster 10/31/2015  . Zoster Recombinat (Shingrix) 09/30/2018, 12/22/2018    TDAP status: Up to date Flu Vaccine status: Up to date Pneumococcal vaccine status: Up to date Covid-19 vaccine status: Completed vaccines  Qualifies for Shingles Vaccine? Yes   Zostavax completed Yes   Shingrix Completed?: Yes  Screening Tests Health Maintenance  Topic Date Due  . OPHTHALMOLOGY EXAM  01/14/2019  . FOOT EXAM   12/30/2019  . HEMOGLOBIN A1C  04/30/2020  . TETANUS/TDAP  04/10/2027  . INFLUENZA VACCINE  Completed  . COVID-19 Vaccine  Completed  . PNA vac Low Risk Adult  Completed    Health Maintenance  Health Maintenance Due  Topic Date Due  . OPHTHALMOLOGY EXAM  01/14/2019    Colorectal cancer screening: No longer required.    Additional Screening:   Vision Screening: Recommended annual ophthalmology exams for early detection of glaucoma and other disorders of the eye. Is the patient up to date with their annual eye exam?  Yes  Who is the provider or what is the name of the office in which the patient attends annual eye exams? Dr Gershon Crane    Dental Screening: Recommended annual dental exams for proper oral hygiene  Community Resource Referral / Chronic Care Management: CRR required this visit?  No   CCM required this visit?  No      Plan:     I have personally reviewed and noted the following in the patient's chart:   . Medical and social history . Use of alcohol, tobacco or illicit drugs  . Current medications and supplements . Functional ability and status . Nutritional status . Physical activity . Advanced directives . List of other physicians . Hospitalizations, surgeries, and ER visits in previous 12 months . Vitals . Screenings to include cognitive, depression, and falls . Referrals and appointments  In addition, I have reviewed and discussed with patient certain preventive protocols, quality metrics, and best practice recommendations. A written personalized care plan for preventive services as well as general preventive health recommendations were provided to patient.     Willette Brace, LPN   [id number]   Nurse Notes: None

## 2020-01-02 ENCOUNTER — Other Ambulatory Visit: Payer: Self-pay | Admitting: Cardiology

## 2020-01-02 DIAGNOSIS — I10 Essential (primary) hypertension: Secondary | ICD-10-CM

## 2020-01-05 ENCOUNTER — Telehealth: Payer: Self-pay

## 2020-01-06 ENCOUNTER — Telehealth: Payer: PPO

## 2020-01-06 NOTE — Progress Notes (Unsigned)
Chronic Care Management Pharmacy  Name: Roy [name] MRN: [id number]   DOB: Jul 09, 1932  Chief Complaint/ HPI Roy [name], 86 y.o., male, presents for their {initial or follow up:24811::"initial"} CCM visit with the clinical pharmacist {in office or via telephone:24812::"via telephone due to COVID-19 pandemic"} .  PCP : Marin Olp, MD No diagnosis found.  Office Visits:   10/29/2019 (PCP):   Consult Visit: *** Patient Active Problem List   Diagnosis Date Noted  . Right rotator cuff tear 12/11/2018  . Calcific tendinitis of right shoulder 09/03/2018  . AC (acromioclavicular) arthritis 09/03/2018  . Pain of right thumb 04/08/2018  . Food impaction of esophagus 04/03/2018  . Senile purpura ([place]) 08/08/2017  . Thrombocytopenia ([place]) 02/23/2015  . GERD (gastroesophageal reflux disease) 05/03/2014  . Insomnia 05/03/2014  . Allergic rhinitis 04/21/2014  . Giardia 04/21/2014  . Mild persistent asthma 10/30/2010  . CKD (chronic kidney disease), stage III ([place]) 02/22/2009  . NEPHROLITHIASIS, RECURRENT 01/21/2009  . ACTINIC KERATOSIS, HEAD 06/14/2008  . Type II diabetes mellitus with renal manifestations ([place]) 09/11/2006  . Hyperlipidemia associated with type 2 diabetes mellitus ([place]) 09/11/2006  . Hypertension associated with diabetes ([place]) 09/11/2006  . Coronary artery disease with angina pectoris ([place]) 09/11/2006  . Back pain 09/11/2006  . Sleep apnea 09/11/2006   Past Surgical History:  Procedure Laterality Date  . APPENDECTOMY    . CARDIAC CATHETERIZATION  2013  . CERVICAL LAMINECTOMY    . CHOLECYSTECTOMY    . ESOPHAGOGASTRODUODENOSCOPY N/A 04/04/2018   Procedure: ESOPHAGOGASTRODUODENOSCOPY (EGD);  Surgeon: Lin Landsman, MD;  Location: [facility];  Service: Gastroenterology;  Laterality: N/A;  . KIDNEY STONE SURGERY    . LEFT HEART CATH N/A 02/15/2012   Procedure: LEFT HEART CATH;  Surgeon: Larey Dresser, MD;  Location: [facility] CATH LAB;  Service:  Cardiovascular;  Laterality: N/A;  . SHOULDER ARTHROSCOPY WITH ROTATOR CUFF REPAIR AND SUBACROMIAL DECOMPRESSION Right 01/20/2019   Procedure: SHOULDER ARTHROSCOPY WITH ROTATOR CUFF REPAIR AND SUBACROMIAL DECOMPRESSION, BICEPS TENOTOMY;  Surgeon: [name], MD;  Location: [facility];  Service: Orthopedics;  Laterality: Right;   Family History  Problem Relation Age of Onset  . ALS Father   . Asthma Daughter   . Cancer Sister        breast/liver   Allergies  Allergen Reactions  . Codeine Sulfate Other (See Comments)    Chest pain  . Crestor [Rosuvastatin Calcium]   . Cephalexin Rash   Outpatient Encounter Medications as of 01/06/2020  Medication Sig Note  . acetaminophen (TYLENOL) 325 MG tablet Take 650 mg by mouth every evening. Reported on 06/28/2015   . albuterol (PROVENTIL HFA;VENTOLIN HFA) 108 (90 Base) MCG/ACT inhaler Inhale 2 puffs into the lungs every 4 (four) hours as needed for wheezing or shortness of breath.   [name] amLODipine (NORVASC) 2.5 MG tablet Take 1 tablet by mouth once daily   . aspirin EC 81 MG tablet Take 1 tablet (81 mg total) by mouth daily.   . ASSURE COMFORT LANCETS 30G MISC 1 each by Other route daily.   [name] atorvastatin (LIPITOR) 10 MG tablet Take 1 tablet (10 mg total) by mouth daily.   . calcium-vitamin D (OSCAL WITH D) 500-200 MG-UNIT per tablet Take 1 tablet by mouth daily.   [name] glimepiride (AMARYL) 4 MG tablet TAKE 1 & 1/2 (ONE & ONE-HALF) TABLETS BY MOUTH ONCE DAILY   . glucose blood (ACCU-CHEK AVIVA PLUS) test strip Test blood glucose daily.   [name] guaiFENesin (  MUCINEX) 600 MG 12 hr tablet Take by mouth 2 (two) times daily.   [name] HYDROcodone-acetaminophen (NORCO) 5-325 MG tablet Take 1 tablet by mouth every 6 (six) hours as needed for moderate pain.   . isosorbide mononitrate (IMDUR) 30 MG 24 hr tablet Take 3 tablets by mouth once daily   . latanoprost (XALATAN) 0.005 % ophthalmic solution INSTILL 1 DROP INTO EACH EYE NIGHTLY   . lisinopril  (ZESTRIL) 5 MG tablet Take 1/2 (one-half) tablet by mouth once daily   . metFORMIN (GLUCOPHAGE) 500 MG tablet TAKE 1/2 (ONE-HALF) TABLET BY MOUTH ONCE DAILY WITH BREAKFAST   . metoprolol succinate (TOPROL-XL) 50 MG 24 hr tablet TAKE 1 TABLET BY MOUTH ONCE DAILY (TAKE  WITH  OR  IMMEDIATELY  FOLLOWING  A  MEAL)   . nitroGLYCERIN (NITROSTAT) 0.4 MG SL tablet Place 1 tablet (0.4 mg total) under the tongue every 5 (five) minutes as needed for chest pain. 12/28/2019: As needed   . Omega-3 Fatty Acids (FISH OIL PO) Take 2 capsules by mouth 2 (two) times daily.   [name] omeprazole (PRILOSEC) 20 MG capsule Take 1 capsule by mouth twice daily   . Polyethyl Glycol-Propyl Glycol (SYSTANE OP) Place 1 drop into both eyes 4 (four) times daily. Itchy/dry eye   . potassium citrate (UROCIT-K) 10 MEQ (1080 MG) SR tablet Take 10 mEq by mouth Twice daily.   . ranolazine (RANEXA) 500 MG 12 hr tablet TAKE 1 TABLET BY MOUTH TWICE DAILY. KEEP UPCOMING APPOINTMENT IN OCTOBER   . SYMBICORT 160-4.5 MCG/ACT inhaler Inhale 2 puffs by mouth twice daily   . traMADol (ULTRAM) 50 MG tablet TAKE 1 TABLET BY MOUTH EVERY 6 HOURS AS NEEDED FOR  MODERATE  PAIN  (TWICE  PER  DAY  MAXIMUM)   . traZODone (DESYREL) 150 MG tablet Take 1 tablet by mouth once daily   . zinc sulfate 220 MG capsule Take 220 mg by mouth daily.    No facility-administered encounter medications on file as of 01/06/2020.   Patient Care Team    Relationship Specialty Notifications Start End  Marin Olp, MD PCP - General Family Medicine  04/07/18   Sueanne Margarita, MD PCP - Cardiology Cardiology Admissions 12/31/18   Sueanne Margarita, MD Consulting Physician Cardiology  02/07/17   Juanito Doom, MD Consulting Physician Pulmonary Disease  02/07/17   [facility], Michae Kava, MD Attending Physician   02/07/17    Comment: doctor retired; to be assigned a new MD  Harriett Sine, MD Consulting Physician Dermatology  05/08/18   Rutherford Guys, MD Consulting  Physician Ophthalmology  05/08/18   Lyndal Pulley, DO Consulting Physician Sports Medicine  11/27/18   Madelin Rear, [facility] Pharmacist Pharmacist  10/22/19    Comment: [phone]   Current Diagnosis/Assessment: Goals Addressed   None    Hypertension   BP goal {CHL HP UPSTREAM Pharmacist BP ranges:[phone]}  BP Readings from Last 3 Encounters:  12/21/19 110/70  11/26/19 (!) 152/60  10/29/19 (!) 142/60   Previous medications: *** Patient checks BP at home {CHL HP BP Monitoring Frequency:[phone]}. Recent home readings: ***.  Patient is currently ***at goal on the following medications:  . ***  ***We discussed diet and exercise extensively. ***  Plan  ***Continue current medications.  Diabetes   A1c goal < ***%  Lab Results  Component Value Date/Time   HGBA1C 6.6 (H) 10/29/2019 02:32 PM   HGBA1C 6.7 (H) 04/28/2019 11:24 AM   MICROALBUR 2.7 (H) 02/23/2015 11:17 AM  MICROALBUR 0.8 10/21/2013 11:01 AM    Checking BG: {CHL HP Blood Glucose Monitoring Frequency:[phone]}. Recent FBG readings: ***  Previous medications: ***. Patient is currently ***at goal on the following medications:  . ***  ***We discussed: diet and exercise extensively. ***  Plan  ***Continue current medications.  Hyperlipidemia   LDL goal < ***  Lipid Panel     Component Value Date/Time   CHOL 144 04/28/2019 1124   CHOL 119 05/09/2017 0826   TRIG 180.0 (H) 04/28/2019 1124   TRIG 205 (HH) 12/17/2005 1045   HDL 39.90 04/28/2019 1124   HDL 35 (L) 05/09/2017 0826   LDLCALC 68 04/28/2019 1124   LDLCALC 55 05/09/2017 0826   LDLDIRECT 55.0 12/30/2018 1000    Hepatic Function Latest Ref Rng & Units 10/29/2019 04/28/2019 01/27/2019  Total Protein 6.1 - 8.1 g/dL 6.4 6.6 6.8  Albumin 3.5 - 5.2 g/dL - 4.6 3.7  AST 10 - 35 U/L $Remo'13 11 9  'zhbcu$ ALT 9 - 46 U/L $Remo'10 10 8  'clXGo$ Alk Phosphatase 39 - 117 U/L - 76 75  Total Bilirubin 0.2 - 1.2 mg/dL 0.7 0.8 0.6  Bilirubin, Direct 0.0 - 0.3 mg/dL - - -      The ASCVD Risk score (Milton., et al., 2013) failed to calculate for the following reasons:   The 2013 ASCVD risk score is only valid for ages 50 to 2   Patient has failed these meds in past: *** Patient is currently***at goal the following medications:  . ***  We discussed diet and exercise extensively. ***  Plan  ***Continue current medications.  AFIB   Pulse Readings from Last 3 Encounters:  12/21/19 62  11/26/19 63  10/29/19 (!) 54   ***Patient is currently rate controlled on the following medications:  . ***  Prevention of stroke in Afib. CHA2DS2/VAS Stroke Risk Points      N/A >= 2 Points: High Risk  1 - 1.99 Points: Medium Risk  0 Points: Low Risk    Last Change: N/A      This score determines the patient's risk of having a stroke if the  patient has atrial fibrillation.      This score is not applicable to this patient. Components are not  calculated.   . ***Denies any abnormal bruising, bleeding from nose or gums or blood in urine or stool. Patient is currently ***controlled on the following medications:  . ***  Plan  ***Continue current medications.  Heart Failure   Type: {type of heart failure:30421350}. Last ejection fraction: ***. Previous medications: ***.  ***Patient is currently controlled on the following medications:  . ***  ***We discussed weighing daily; if you gain more than 3 pounds in one day or 5 pounds in one week call doctor.  Plan  Continue current medications.  ***COPD ***Tobacco Cessation     Last spirometry: ***.Gold Grade: {CHL HP Upstream Pharm COPD Gold DEYCX:[id number]} Current COPD Classification:  {CHL HP Upstream Pharm COPD [phone]} Lab Results  Component Value Date/Time   EOSPCT 11.5 10/29/2019 02:32 PM   Lab Results  Component Value Date/Time   EOSABS 989 (H) 10/29/2019 02:32 PM    Previous medications: Using maintenance inhaler regularly? {yes/no:20286}. Frequency of rescue inhaler  use:  {CHL HP Upstream Pharm Inhaler HFWY:[id number]} Patient is currently {CHL Controlled/[phone]} on the following medications: ***  Nicotine Abuse     Social History   Tobacco Use  Smoking Status Former Smoker  . Packs/day: 1.50  . Years: 8.00  .  Pack years: 12.00  . Types: Cigarettes  . Quit date: 03/05/1958  . Years since quitting: 61.8  Smokeless Tobacco Never Used   Patient smokes {Time to first cigarette:23873} Patient triggers include: {Smoking Triggers:23882} On a scale of 1-10, reports MOTIVATION to quit is *** On a scale of 1-10, reports CONFIDENCE in quitting is *** Previous quit attempts included: *** Patient is currently on the following medications:  . ***  We discussed: {Smoking Cessation Counseling:23883}  Plan  ***Continue current medications.  Hypothyroidism   Lab Results  Component Value Date/Time   TSH 1.681 02/14/2012 01:46 PM   TSH 2.34 12/04/2011 02:54 PM   TSH is currently within range on the following medications:  . ***  We discussed:  {CHL HP Upstream Pharmacy discussion:[phone]}.  Plan  ***Continue current medications.  GERD   ***Patient denies recent acid reflux.  Currently controlled on: . ***  We discussed: Avoidance of potential triggers such as alcohol, fatty foods, lying down after eating, and tomato sauce.  Plan   ***Continue current medications.  Osteopenia***Osteoporosis   Last DEXA Scan: ***.  No results found for: VD25OH  Previous medications: ***. Patient {is;is not an osteoporosis candidate:23886}.  Current medications: . ***  We discussed: {Osteoporosis Counseling:23892}.  Plan  ***Continue current medications.  Depression   PHQ9 SCORE ONLY 12/28/2019 04/28/2019 01/27/2019  PHQ-9 Total Score 0 0 9   Previous medications: ***. ***Denies side effects, reports ongoing benefit of taking medication. ***Patient is currently controlled on the following medications:   . ***  Plan  ***Continue current medications.  ***   Previous medications: ***. Patient is currently {CHL Controlled/[phone]} on the following medications:  . ***  We discussed:  ***  Plan  Continue {CHL HP Upstream Pharmacy Plans:[phone]}.  Vaccines   Immunization History  Administered Date(s) Administered  . Influenza Split 12/04/2010, 01/23/2012  . Influenza Whole 12/26/2006, 11/28/2007, 11/24/2008, 12/30/2009  . Influenza, High Dose Seasonal PF 01/13/2013, 12/10/2013, 11/21/2017, 11/19/2018  . Influenza-Unspecified 10/31/2015, 11/21/2016, 12/25/2019  . Moderna SARS-COVID-2 Vaccination 03/15/2019, 04/26/2019  . Pneumococcal Conjugate-13 07/27/2014  . Pneumococcal Polysaccharide-23 06/27/2005  . Td 06/27/2005, 04/09/2017  . Zoster 10/31/2015  . Zoster Recombinat (Shingrix) 09/30/2018, 12/22/2018    Reviewed and discussed patient's vaccination history.    Plan  Recommended patient receive ***.   Medication Management / Care Coordination   Receives prescription medications from:  [place], [place] - [place] [address] [place] [address] Phone: [phone] Fax: [phone] - [place], [place] - [address] [place] [address] Phone: [phone] Fax: [phone]  [id number] [place], [place] - [address] [place] [address] Phone: [phone] Fax: [phone]  [id number] [place] - [place] [address] [place] [address] Phone: [phone] Fax: [phone]    ***  Plan  {US Pharmacy YBWL:89373}. ___________________________ SDOH (Social Determinants of Health) assessments performed: Yes.  Future Appointments  Date Time Provider [place]  01/06/2020  2:30 PM [facility] CCM PHARMACIST [facility]  05/09/2020 10:40 AM Marin Olp, MD [facility]  01/02/2021   3:15 PM [facility] HEALTH COACH [facility]   Visit follow-up:  . CPA follow-up: ***. [name] RPH follow-up: *** month *** visit.  Madelin Rear, Pharm.D., BCGP Clinical Pharmacist [facility] [phone]

## 2020-01-16 ENCOUNTER — Other Ambulatory Visit: Payer: Self-pay | Admitting: Family Medicine

## 2020-01-18 ENCOUNTER — Ambulatory Visit: Payer: PPO

## 2020-01-18 NOTE — Chronic Care Management (AMB) (Signed)
  Chronic Care Management   Outreach Note   Name: Roy [name] MRN: [id number] DOB: [birth year]  Referred by: Marin Olp, MD  Reason for referral: Telephone Appointment with [place] Pharmacist, Madelin Rear.   Telephone appointment with clinical pharmacist today (01/18/2020) at 11am. Requested call back to reschedule.   Madelin Rear, Pharm.D., BCGP Clinical Pharmacist [facility] [phone]

## 2020-01-23 ENCOUNTER — Other Ambulatory Visit: Payer: Self-pay | Admitting: Family Medicine

## 2020-01-26 ENCOUNTER — Encounter: Payer: Self-pay | Admitting: Family Medicine

## 2020-01-26 ENCOUNTER — Ambulatory Visit (INDEPENDENT_AMBULATORY_CARE_PROVIDER_SITE_OTHER): Payer: PPO | Admitting: Family Medicine

## 2020-01-26 VITALS — BP 136/70 | HR 65 | Temp 98.5°F | Ht 69.0 in | Wt 186.2 lb

## 2020-01-26 DIAGNOSIS — M79641 Pain in right hand: Secondary | ICD-10-CM | POA: Diagnosis not present

## 2020-01-26 DIAGNOSIS — M79642 Pain in left hand: Secondary | ICD-10-CM | POA: Diagnosis not present

## 2020-01-26 DIAGNOSIS — E1159 Type 2 diabetes mellitus with other circulatory complications: Secondary | ICD-10-CM | POA: Diagnosis not present

## 2020-01-26 DIAGNOSIS — I152 Hypertension secondary to endocrine disorders: Secondary | ICD-10-CM | POA: Diagnosis not present

## 2020-01-26 MED ORDER — TRAMADOL HCL 50 MG PO TABS
ORAL_TABLET | ORAL | 2 refills | Status: DC
Start: 2020-01-26 — End: 2020-07-25

## 2020-01-26 NOTE — Patient Instructions (Addendum)
Health Maintenance Due  Topic Date Due  . OPHTHALMOLOGY EXAM - Sign release of information at the check out desk for a copy of this  01/14/2019   I suspect hand pain is related to arthritis.  If he is going to be at home and not having to drive or operate heavy machinery I recommend he take tramadol twice a day for the next 3 days or up to 5 days-can continue his horse liniment since that seems to be the most helpful topical.  I am hoping this will control his blood pressure and as he gets further out from his aggressive project that his pain will improve and blood pressure will come back down   Recommended follow up: keep march visit unless blood pressure does not come back down with pain better controlled Recommended follow up: No follow-ups on file.

## 2020-01-26 NOTE — Progress Notes (Signed)
Phone [phone] In person visit   Subjective:   AMARIO LONGMORE is a 86 y.o. year old very pleasant male patient who presents for/with See problem oriented charting Chief Complaint  Patient presents with  . elevated blood pressure    pt states he thinks his blood pressure elevates when his hands begin to hurt   . Hand Pain    bilateral hand pain x last week     This visit occurred during the SARS-CoV-2 public health emergency.  Safety protocols were in place, including screening questions prior to the visit, additional usage of staff PPE, and extensive cleaning of exam room while observing appropriate contact time as indicated for disinfecting solutions.   Past Medical History-  Patient Active Problem List   Diagnosis Date Noted  . Type II diabetes mellitus with renal manifestations ([place]) 09/11/2006    Priority: High  . Coronary artery disease with angina pectoris ([place]) 09/11/2006    Priority: High  . Thrombocytopenia ([place]) 02/23/2015    Priority: Medium  . Mild persistent asthma 10/30/2010    Priority: Medium  . CKD (chronic kidney disease), stage III ([place]) 02/22/2009    Priority: Medium  . Hyperlipidemia associated with type 2 diabetes mellitus ([place]) 09/11/2006    Priority: Medium  . Hypertension associated with diabetes ([place]) 09/11/2006    Priority: Medium  . Back pain 09/11/2006    Priority: Medium  . Sleep apnea 09/11/2006    Priority: Medium  . Senile purpura ([place]) 08/08/2017    Priority: Low  . GERD (gastroesophageal reflux disease) 05/03/2014    Priority: Low  . Insomnia 05/03/2014    Priority: Low  . Allergic rhinitis 04/21/2014    Priority: Low  . Giardia 04/21/2014    Priority: Low  . NEPHROLITHIASIS, RECURRENT 01/21/2009    Priority: Low  . ACTINIC KERATOSIS, HEAD 06/14/2008    Priority: Low  . Right rotator cuff tear 12/11/2018  . Calcific tendinitis of right shoulder 09/03/2018  . AC (acromioclavicular) arthritis 09/03/2018  . Pain of right thumb  04/08/2018  . Food impaction of esophagus 04/03/2018    Medications- reviewed and updated Current Outpatient Medications  Medication Sig Dispense Refill  . acetaminophen (TYLENOL) 325 MG tablet Take 650 mg by mouth every evening. Reported on 06/28/2015    . albuterol (PROVENTIL HFA;VENTOLIN HFA) 108 (90 Base) MCG/ACT inhaler Inhale 2 puffs into the lungs every 4 (four) hours as needed for wheezing or shortness of breath. 1 Inhaler 10  . amLODipine (NORVASC) 2.5 MG tablet Take 1 tablet by mouth once daily 90 tablet 0  . aspirin EC 81 MG tablet Take 1 tablet (81 mg total) by mouth daily.    . ASSURE COMFORT LANCETS 30G MISC 1 each by Other route daily.    [name] atorvastatin (LIPITOR) 10 MG tablet Take 1 tablet (10 mg total) by mouth daily. 90 tablet 3  . calcium-vitamin D (OSCAL WITH D) 500-200 MG-UNIT per tablet Take 1 tablet by mouth daily.    [name] FLUZONE HIGH-DOSE QUADRIVALENT 0.7 ML SUSY     . glimepiride (AMARYL) 4 MG tablet TAKE 1 & 1/2 (ONE & ONE-HALF) TABLETS BY MOUTH ONCE DAILY 135 tablet 0  . glucose blood (ACCU-CHEK AVIVA PLUS) test strip Test blood glucose daily. 100 each 12  . guaiFENesin (MUCINEX) 600 MG 12 hr tablet Take by mouth 2 (two) times daily.    [name] HYDROcodone-acetaminophen (NORCO) 5-325 MG tablet Take 1 tablet by mouth every 6 (six) hours as needed for moderate pain.  20 tablet 0  . isosorbide mononitrate (IMDUR) 30 MG 24 hr tablet Take 3 tablets by mouth once daily 270 tablet 0  . latanoprost (XALATAN) 0.005 % ophthalmic solution INSTILL 1 DROP INTO EACH EYE NIGHTLY    . lisinopril (ZESTRIL) 5 MG tablet Take 1/2 (one-half) tablet by mouth once daily 45 tablet 0  . metFORMIN (GLUCOPHAGE) 500 MG tablet TAKE 1/2 (ONE-HALF) TABLET BY MOUTH ONCE DAILY WITH BREAKFAST 45 tablet 0  . metoprolol succinate (TOPROL-XL) 50 MG 24 hr tablet TAKE 1 TABLET BY MOUTH ONCE DAILY (TAKE  WITH  OR  IMMEDIATELY  FOLLOWING  A  MEAL) 90 tablet 0  . nitroGLYCERIN (NITROSTAT) 0.4 MG SL tablet Place 1  tablet (0.4 mg total) under the tongue every 5 (five) minutes as needed for chest pain. 25 tablet 1  . Omega-3 Fatty Acids (FISH OIL PO) Take 2 capsules by mouth 2 (two) times daily.    [name] omeprazole (PRILOSEC) 20 MG capsule Take 1 capsule by mouth twice daily 180 capsule 0  . Polyethyl Glycol-Propyl Glycol (SYSTANE OP) Place 1 drop into both eyes 4 (four) times daily. Itchy/dry eye    . potassium citrate (UROCIT-K) 10 MEQ (1080 MG) SR tablet Take 10 mEq by mouth Twice daily.    . ranolazine (RANEXA) 500 MG 12 hr tablet TAKE 1 TABLET BY MOUTH TWICE DAILY. KEEP UPCOMING APPOINTMENT IN OCTOBER 180 tablet 3  . SYMBICORT 160-4.5 MCG/ACT inhaler Inhale 2 puffs by mouth twice daily 11 g 5  . traMADol (ULTRAM) 50 MG tablet TAKE 1 TABLET BY MOUTH EVERY 6 HOURS AS NEEDED FOR  MODERATE  PAIN  (TWICE  PER  DAY  MAXIMUM) 60 tablet 2  . traZODone (DESYREL) 150 MG tablet Take 1 tablet by mouth once daily 90 tablet 0  . zinc sulfate 220 MG capsule Take 220 mg by mouth daily.     No current facility-administered medications for this visit.     Objective:  BP 136/70   Pulse 65   Temp 98.5 F (36.9 C) (Temporal)   Ht 5\' 9"  (1.753 m)   Wt 186 lb 3.2 oz (84.5 kg)   SpO2 94%   BMI 27.50 kg/m  Gen: NAD, resting comfortably CV: RRR no murmurs rubs or gallops Lungs: CTAB no crackles, wheeze, rhonchi Ext: no edema Skin: warm, dry  Diabetic Foot Exam - Simple   Simple Foot Form Diabetic Foot exam was performed with the following findings: Yes 01/26/2020 10:40 AM  Visual Inspection No deformities, no ulcerations, no other skin breakdown bilaterally: Yes Sensation Testing Intact to touch and monofilament testing bilaterally: Yes Pulse Check Posterior Tibialis and Dorsalis pulse intact bilaterally: Yes Comments Onychomycosis on several toes both feet        Assessment and Plan    # Hand pain S:patient has been 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 Has been using a drill for last 2  weeks trying to prep this. Has noted significant worsening of pain in his hands since that time.   When hand pain is high he has checked blood pressure and up into the 170s on several occasions over the last week.   He has tried voltaren gel, hempbarna, ben gay and horse liniment (best of all) but still with little relief. Has some tramadol and that seems to be the most effective- mainly just before bed.  A/P: I suspect hand pain is related to arthritis worsened by recent heavy work building corral for cow  at 54! He finished project and I think he is going to improve but want BP to be lower in short run and pain to be better.   If he is going to be at home and not having to drive or operate heavy machinery I recommend he take tramadol twice a day for the next 3 days or up to 5 days-can continue his horse liniment since that seems to be the most helpful topical.  I am hoping this will control his blood pressure and as he gets further out from his aggressive project that his pain will improve and blood pressure will come back down   # Hypertension S:compliant with  Amlodipine 2.5, imdur 90mg , lisinopril 5 mg, metoprolol 50mg  XR -on lisinopril in case proteinuric element. GFR usually 30-40 range. No chest pain or shortness of breath A/P: Blood pressure is well controlled today but at home with pain has been elevated up to 170-we are going to try to reduce pain levels and see if we can bring blood pressures down at home.  Thankfully with pain control today his blood pressure was reasonably well controlled in office.  Recommended follow up: keep march visit unless blood pressure does not come back down with pain better controlled Future Appointments  Date Time Provider [place]  05/09/2020 10:40 AM Marin Olp, MD [facility]  01/02/2021  3:15 PM [facility] HEALTH COACH [facility]    Lab/Order associations:   ICD-10-CM   1. Hypertension associated with diabetes ([place])  E11.59    I15.2    2. Pain in both hands  M79.641    M79.642     Meds ordered this encounter  Medications  . traMADol (ULTRAM) 50 MG tablet    Sig: TAKE 1 TABLET BY MOUTH EVERY 6 HOURS AS NEEDED FOR  MODERATE  PAIN  (TWICE  PER  DAY  MAXIMUM)    Dispense:  60 tablet    Refill:  2   Return precautions advised.  [name], MD

## 2020-03-13 ENCOUNTER — Other Ambulatory Visit: Payer: Self-pay | Admitting: Family Medicine

## 2020-03-23 ENCOUNTER — Other Ambulatory Visit: Payer: Self-pay | Admitting: Family Medicine

## 2020-03-24 ENCOUNTER — Other Ambulatory Visit: Payer: Self-pay | Admitting: Family Medicine

## 2020-03-24 DIAGNOSIS — E1122 Type 2 diabetes mellitus with diabetic chronic kidney disease: Secondary | ICD-10-CM

## 2020-03-24 NOTE — Telephone Encounter (Signed)
Last OV 01/26/20 Last refill Glimepiride 12/20/19 #135/0                 Isosorbide 12/27/19 #270/0 Next OV 05/09/20

## 2020-04-22 ENCOUNTER — Other Ambulatory Visit: Payer: Self-pay | Admitting: Family Medicine

## 2020-04-27 DIAGNOSIS — M19042 Primary osteoarthritis, left hand: Secondary | ICD-10-CM | POA: Diagnosis not present

## 2020-04-27 DIAGNOSIS — M19041 Primary osteoarthritis, right hand: Secondary | ICD-10-CM | POA: Diagnosis not present

## 2020-04-30 ENCOUNTER — Other Ambulatory Visit: Payer: Self-pay | Admitting: Family Medicine

## 2020-05-04 NOTE — Patient Instructions (Addendum)
Please stop by lab before you go If you have mychart- we will send your results within 3 business days of [place] receiving them.  If you do not have mychart- we will call you about results within 5 business days of [place] receiving them.  *please also note that you will see labs on mychart as soon as they post. I will later go in and write notes on them- will say "notes from Dr. Yong Channel"  Try omeprazole just once a day and see if it controls reflux.  If it does we can change prescription at next visit  Health Maintenance Due  Topic Date Due  . OPHTHALMOLOGY EXAM Scheduled for tomorrow. Have them send [place] a copy 01/14/2019  . COVID-19 Vaccine (3 - Moderna risk 4-dose series) Will call back with dates.  05/24/2019    Recommended follow up: Return in about 6 months (around 11/09/2020) for follow up- or sooner if needed.

## 2020-05-04 NOTE — Progress Notes (Signed)
Phone: [phone]   Subjective:  Patient presents today for their annual physical. Chief complaint-noted.   See problem oriented charting- ROS- full  review of systems was completed and negative  except for: sneezing, joint and back pain, neck pain, bruising easily  The following were reviewed and entered/updated in epic: Past Medical History:  Diagnosis Date  . Anginal pain ([place])   . Arthritis    spine  . Asthma   . CAD (coronary artery disease)    s/p PCI of the D1 with cath in 2013 showing nonobstructive disease.  CP presumed due to small vessel disease.  [name] CAP (community acquired pneumonia) 12/26/2011  . Chronic kidney disease    renal insufficiency  . COPD (chronic obstructive pulmonary disease) ([place])   . Diabetes mellitus, type 2 ([place])   . Fever 12/26/2011  . GERD (gastroesophageal reflux disease)   . Heart murmur   . Hyperlipidemia   . Hypertension   . Lyme disease    states had shakes that were thought to be parkinson's related but related to tick bite, states also RMSF  . Myocardial infarction ([place])   . Right rotator cuff tear   . Shortness of breath   . Sleep apnea    intolerant to PAP   Patient Active Problem List   Diagnosis Date Noted  . Type II diabetes mellitus with renal manifestations ([place]) 09/11/2006    Priority: High  . Coronary artery disease with angina pectoris ([place]) 09/11/2006    Priority: High  . Thrombocytopenia ([place]) 02/23/2015    Priority: Medium  . Mild persistent asthma 10/30/2010    Priority: Medium  . CKD (chronic kidney disease), stage III ([place]) 02/22/2009    Priority: Medium  . Hyperlipidemia associated with type 2 diabetes mellitus ([place]) 09/11/2006    Priority: Medium  . Hypertension associated with diabetes ([place]) 09/11/2006    Priority: Medium  . Back pain 09/11/2006    Priority: Medium  . Sleep apnea 09/11/2006    Priority: Medium  . Senile purpura ([place]) 08/08/2017    Priority: Low  . GERD (gastroesophageal reflux  disease) 05/03/2014    Priority: Low  . Insomnia 05/03/2014    Priority: Low  . Allergic rhinitis 04/21/2014    Priority: Low  . Giardia 04/21/2014    Priority: Low  . NEPHROLITHIASIS, RECURRENT 01/21/2009    Priority: Low  . ACTINIC KERATOSIS, HEAD 06/14/2008    Priority: Low  . Right rotator cuff tear 12/11/2018  . Calcific tendinitis of right shoulder 09/03/2018  . AC (acromioclavicular) arthritis 09/03/2018  . Pain of right thumb 04/08/2018  . Food impaction of esophagus 04/03/2018   Past Surgical History:  Procedure Laterality Date  . APPENDECTOMY    . CARDIAC CATHETERIZATION  2013  . CERVICAL LAMINECTOMY    . CHOLECYSTECTOMY    . ESOPHAGOGASTRODUODENOSCOPY N/A 04/04/2018   Procedure: ESOPHAGOGASTRODUODENOSCOPY (EGD);  Surgeon: Lin Landsman, MD;  Location: [facility] ENDOSCOPY;  Service: Gastroenterology;  Laterality: N/A;  . KIDNEY STONE SURGERY    . LEFT HEART CATH N/A 02/15/2012   Procedure: LEFT HEART CATH;  Surgeon: Larey Dresser, MD;  Location: [facility] CATH LAB;  Service: Cardiovascular;  Laterality: N/A;  . SHOULDER ARTHROSCOPY WITH ROTATOR CUFF REPAIR AND SUBACROMIAL DECOMPRESSION Right 01/20/2019   Procedure: SHOULDER ARTHROSCOPY WITH ROTATOR CUFF REPAIR AND SUBACROMIAL DECOMPRESSION, BICEPS TENOTOMY;  Surgeon: [name], MD;  Location: [place];  Service: Orthopedics;  Laterality: Right;    Family History  Problem Relation Age of Onset  .  ALS Father   . Asthma Daughter   . Cancer Sister        breast/liver  . Cancer Brother        bone cancer    Medications- reviewed and updated Current Outpatient Medications  Medication Sig Dispense Refill  . acetaminophen (TYLENOL) 325 MG tablet Take 650 mg by mouth every evening. Reported on 06/28/2015    . albuterol (PROVENTIL HFA;VENTOLIN HFA) 108 (90 Base) MCG/ACT inhaler Inhale 2 puffs into the lungs every 4 (four) hours as needed for wheezing or shortness of breath. 1 Inhaler 10  . amLODipine  (NORVASC) 2.5 MG tablet Take 1 tablet by mouth once daily 90 tablet 0  . aspirin EC 81 MG tablet Take 1 tablet (81 mg total) by mouth daily.    . ASSURE COMFORT LANCETS 30G MISC 1 each by Other route daily.    [name] atorvastatin (LIPITOR) 10 MG tablet Take 1 tablet (10 mg total) by mouth daily. 90 tablet 3  . calcium-vitamin D (OSCAL WITH D) 500-200 MG-UNIT per tablet Take 1 tablet by mouth daily.    [name] glimepiride (AMARYL) 4 MG tablet TAKE 1 & 1/2 (ONE & ONE-HALF) TABLETS BY MOUTH ONCE DAILY 135 tablet 0  . glucose blood (ACCU-CHEK AVIVA PLUS) test strip Test blood glucose daily. 100 each 12  . guaiFENesin (MUCINEX) 600 MG 12 hr tablet Take by mouth 2 (two) times daily.    . isosorbide mononitrate (IMDUR) 30 MG 24 hr tablet Take 3 tablets by mouth once daily 270 tablet 0  . latanoprost (XALATAN) 0.005 % ophthalmic solution INSTILL 1 DROP INTO EACH EYE NIGHTLY    . lisinopril (ZESTRIL) 5 MG tablet Take 1/2 (one-half) tablet by mouth once daily 45 tablet 0  . metFORMIN (GLUCOPHAGE) 500 MG tablet TAKE 1/2 (ONE-HALF) TABLET BY MOUTH ONCE DAILY WITH BREAKFAST 45 tablet 0  . metoprolol succinate (TOPROL-XL) 50 MG 24 hr tablet TAKE 1 TABLET BY MOUTH ONCE DAILY (TAKE  WITH  OR  IMMEDIATELY  FOLLOWING  A  MEAL) 90 tablet 0  . nitroGLYCERIN (NITROSTAT) 0.4 MG SL tablet DISSOLVE ONE TABLET UNDER THE TONGUE EVERY FIVE MINUTES AS NEEDED FOR CHEST PAIN 25 tablet 0  . Omega-3 Fatty Acids (FISH OIL PO) Take 2 capsules by mouth 2 (two) times daily.    [name] omeprazole (PRILOSEC) 20 MG capsule Take 1 capsule by mouth twice daily 180 capsule 0  . Polyethyl Glycol-Propyl Glycol (SYSTANE OP) Place 1 drop into both eyes 4 (four) times daily. Itchy/dry eye    . potassium citrate (UROCIT-K) 10 MEQ (1080 MG) SR tablet Take 10 mEq by mouth Twice daily.    . ranolazine (RANEXA) 500 MG 12 hr tablet TAKE 1 TABLET BY MOUTH TWICE DAILY. KEEP UPCOMING APPOINTMENT IN OCTOBER 180 tablet 3  . SYMBICORT 160-4.5 MCG/ACT inhaler Inhale 2  puffs by mouth twice daily 11 g 0  . traMADol (ULTRAM) 50 MG tablet TAKE 1 TABLET BY MOUTH EVERY 6 HOURS AS NEEDED FOR  MODERATE  PAIN  (TWICE  PER  DAY  MAXIMUM) 60 tablet 2  . traZODone (DESYREL) 150 MG tablet Take 1 tablet by mouth once daily 90 tablet 0  . zinc sulfate 220 MG capsule Take 220 mg by mouth daily.     No current facility-administered medications for this visit.    Allergies-reviewed and updated Allergies  Allergen Reactions  . Codeine Sulfate Other (See Comments)    Chest pain  . Crestor [Rosuvastatin Calcium]   . Cephalexin  Rash    Social History   Social History Narrative   Married (wife patient of Dr. Yong Channel). 3 children. 4 grandchildren. 3 greatgrandchildren.       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 Oropharynx normal Neck: no thyromegaly CV: RRR no murmurs rubs or gallops Lungs: CTAB no crackles, wheeze, rhonchi Abdomen: soft/nontender/nondistended/normal bowel sounds. No rebound or guarding. Old cholecystectomy scar Ext: no edema Skin: warm, dry Neuro: grossly normal, moves all extremities, PERRLA    Assessment and Plan  87 y.o. male presenting for annual physical.  Health Maintenance counseling: 1. Anticipatory guidance: Patient counseled regarding regular dental exams - transitioning due to network issues- q6 months typically, eye exams -yearly scheduled tomorrow,  avoiding smoking and second hand smoke , limiting alcohol to 2 beverages per day - doesn't drink.   2. Risk factor reduction:  Advised patient of need for regular exercise and diet rich and fruits and vegetables to reduce risk of heart attack and stroke. Exercise-  remaining active and will increase in spring months. Diet- weight up 8 lbs on home scales- he thinks will improve in spring/summer months with better activity. .  Wt Readings from Last 3 Encounters:  05/09/20 190 lb 6.4 oz (86.4 kg)  01/26/20 186 lb 3.2 oz (84.5 kg)  12/21/19 192 lb (87.1 kg)  3. Immunizations/screenings/ancillary studies- has had covid vaccine but he will call with date of #3 Immunization History  Administered Date(s) Administered  . Influenza Split 12/04/2010, 01/23/2012  . Influenza Whole 12/26/2006, 11/28/2007, 11/24/2008, 12/30/2009  . Influenza, High Dose Seasonal PF 01/13/2013, 12/10/2013, 11/21/2017, 11/19/2018  . Influenza-Unspecified 10/31/2015, 11/21/2016, 12/25/2019  . Moderna Sars-Covid-2 Vaccination 03/15/2019, 04/26/2019  . Pneumococcal Conjugate-13 07/27/2014  . Pneumococcal Polysaccharide-23 06/27/2005  . Td 06/27/2005, 04/09/2017  . Zoster 10/31/2015  . Zoster Recombinat (Shingrix) 09/30/2018, 12/22/2018  4. Prostate cancer screening- no significant change in urinary symptoms- passed age based screenings. Pees a lot but drinks a lot  Lab Results  Component Value Date   PSA 0.78 02/14/2009   PSA 0.80 08/13/2008   PSA 0.76 03/15/2008   5. Colon cancer screening - passed age based screening recommendations 6. Skin cancer screening- scheduling to see one soon- has been at least 5 years. advised regular sunscreen use. Denies worrisome, changing, or new skin lesions.  7. former smoker- no regular cancer screenings required 8. STD screening - only active with wife  Status of chronic or acute concerns   # Diabetes S:compliant with  Metformin 500mg  half tablet with breakfast, glimepiride 6 mg A/P: hopefully stable- update a1c today. Continue current meds    # CAD with angina/Hyperlipidemia S:compliant with atorvastatin 10mg  and fish oil and aspirin 81mg . LDL goal <70 . Asymptomatic while on ranexa and imdur. Also on metoprolol 50mg  XR Lab Results   Component Value Date   CHOL 144 04/28/2019   HDL 39.90 04/28/2019   LDLCALC 68 04/28/2019   LDLDIRECT 55.0 12/30/2018   TRIG 180.0 (H) 04/28/2019   CHOLHDL 4 04/28/2019  A/P: CAD asymptomatic on ranexa/imdur. Lipids have been at goal- update lipids today. Hopefully stable.    #  Hypertension/CKD III S:compliant with  Amlodipine 2.5, imdur 90mg , lisinopril 2.5 mg, metoprolol 50mg  XR -on lisinopril in case proteinuric element. GFR usually 30-40 range -124/70 yesterday A/P: Blood pressure is reasonably controlled on repeat-continue current medicine.  Has even better control at home. -Chronic kidney disease stage III patient knows to avoid ibuprofen/Aleve/Motrin/NSAIDs and we will monitor renal function today  # Asthma S: compliant with symbicort. uses albuterol very sparingly A/P:Stable. Continue current medications.   # GERD S:Medication: omeprazole 20mg  twice daily  B12 levels related to PPI use: takes b12 supplement on long term ppi A/P: Good control-Try omeprazole just once a day and see if it controls reflux.  If it does we can change prescription at next visit  #history of kidney stones- remains on potassium citrate through urology   #chronic pain- tramadol takes a mild edge off of pain- willing to refill  -also in therapy for his hands for arthritis- some help  #insomnia- trazodone helping for sleep - willing to refill   Recommended follow up: No follow-ups on file. Future Appointments  Date Time Provider [place]  01/02/2021  3:15 PM [facility]   Lab/Order associations:NOT fasting- toast at 8 am   ICD-10-CM   1. Preventative health care  Z00.00 Hemoglobin A1c    CBC with Differential/Platelet    Comprehensive metabolic panel    Lipid panel  2. Hypertension associated with diabetes ([place])  E11.59 CBC with Differential/Platelet   I15.2 Comprehensive metabolic panel  3. Gastroesophageal reflux disease without esophagitis  K21.9   4.  Type 2 diabetes mellitus with diabetic nephropathy, without long-term current use of insulin ([facility])  E11.21 Hemoglobin A1c    CBC with Differential/Platelet    Comprehensive metabolic panel    Lipid panel  5. Hyperlipidemia associated with type 2 diabetes mellitus ([facility])  E11.69 Lipid panel   E78.5   6. Senile purpura ([facility]) Chronic. Noted on aspirin- will monitor  D69.2   7. Coronary artery disease involving native coronary artery of native heart with angina pectoris ([place]) Chronic I25.119     No orders of the defined types were placed in this encounter.   Return precautions advised.  [name], MD

## 2020-05-06 ENCOUNTER — Other Ambulatory Visit: Payer: Self-pay | Admitting: Family Medicine

## 2020-05-06 DIAGNOSIS — M19041 Primary osteoarthritis, right hand: Secondary | ICD-10-CM | POA: Diagnosis not present

## 2020-05-06 DIAGNOSIS — M19042 Primary osteoarthritis, left hand: Secondary | ICD-10-CM | POA: Diagnosis not present

## 2020-05-09 ENCOUNTER — Ambulatory Visit (INDEPENDENT_AMBULATORY_CARE_PROVIDER_SITE_OTHER): Payer: PPO | Admitting: Family Medicine

## 2020-05-09 ENCOUNTER — Encounter: Payer: Self-pay | Admitting: Family Medicine

## 2020-05-09 ENCOUNTER — Other Ambulatory Visit: Payer: Self-pay

## 2020-05-09 ENCOUNTER — Telehealth: Payer: Self-pay

## 2020-05-09 VITALS — BP 138/80 | HR 62 | Temp 98.2°F | Ht 69.0 in | Wt 190.4 lb

## 2020-05-09 DIAGNOSIS — Z0000 Encounter for general adult medical examination without abnormal findings: Secondary | ICD-10-CM | POA: Diagnosis not present

## 2020-05-09 DIAGNOSIS — E785 Hyperlipidemia, unspecified: Secondary | ICD-10-CM | POA: Diagnosis not present

## 2020-05-09 DIAGNOSIS — K219 Gastro-esophageal reflux disease without esophagitis: Secondary | ICD-10-CM

## 2020-05-09 DIAGNOSIS — E1159 Type 2 diabetes mellitus with other circulatory complications: Secondary | ICD-10-CM

## 2020-05-09 DIAGNOSIS — I152 Hypertension secondary to endocrine disorders: Secondary | ICD-10-CM | POA: Diagnosis not present

## 2020-05-09 DIAGNOSIS — I25119 Atherosclerotic heart disease of native coronary artery with unspecified angina pectoris: Secondary | ICD-10-CM | POA: Diagnosis not present

## 2020-05-09 DIAGNOSIS — D692 Other nonthrombocytopenic purpura: Secondary | ICD-10-CM | POA: Diagnosis not present

## 2020-05-09 DIAGNOSIS — E1169 Type 2 diabetes mellitus with other specified complication: Secondary | ICD-10-CM

## 2020-05-09 DIAGNOSIS — E1121 Type 2 diabetes mellitus with diabetic nephropathy: Secondary | ICD-10-CM

## 2020-05-09 LAB — CBC WITH DIFFERENTIAL/PLATELET
Basophils Absolute: 0.1 10*3/uL (ref 0.0–0.1)
Basophils Relative: 0.9 % (ref 0.0–3.0)
Eosinophils Absolute: 0.9 10*3/uL — ABNORMAL HIGH (ref 0.0–0.7)
Eosinophils Relative: 10.6 % — ABNORMAL HIGH (ref 0.0–5.0)
HCT: 37.8 % — ABNORMAL LOW (ref 39.0–52.0)
Hemoglobin: 12.8 g/dL — ABNORMAL LOW (ref 13.0–17.0)
Lymphocytes Relative: 16.2 % (ref 12.0–46.0)
Lymphs Abs: 1.3 10*3/uL (ref 0.7–4.0)
MCHC: 33.8 g/dL (ref 30.0–36.0)
MCV: 84.9 fl (ref 78.0–100.0)
Monocytes Absolute: 0.6 10*3/uL (ref 0.1–1.0)
Monocytes Relative: 7.5 % (ref 3.0–12.0)
Neutro Abs: 5.4 10*3/uL (ref 1.4–7.7)
Neutrophils Relative %: 64.8 % (ref 43.0–77.0)
Platelets: 125 10*3/uL — ABNORMAL LOW (ref 150.0–400.0)
RBC: 4.46 Mil/uL (ref 4.22–5.81)
RDW: 14.8 % (ref 11.5–15.5)
WBC: 8.3 10*3/uL (ref 4.0–10.5)

## 2020-05-09 LAB — COMPREHENSIVE METABOLIC PANEL
ALT: 11 U/L (ref 0–53)
AST: 11 U/L (ref 0–37)
Albumin: 4.2 g/dL (ref 3.5–5.2)
Alkaline Phosphatase: 78 U/L (ref 39–117)
BUN: 32 mg/dL — ABNORMAL HIGH (ref 6–23)
CO2: 29 mEq/L (ref 19–32)
Calcium: 9.3 mg/dL (ref 8.4–10.5)
Chloride: 102 mEq/L (ref 96–112)
Creatinine, Ser: 2.15 mg/dL — ABNORMAL HIGH (ref 0.40–1.50)
GFR: 27.39 mL/min — ABNORMAL LOW (ref >60.00)
Glucose, Bld: 192 mg/dL — ABNORMAL HIGH (ref 70–99)
Potassium: 4.8 mEq/L (ref 3.5–5.1)
Sodium: 139 mEq/L (ref 135–145)
Total Bilirubin: 0.6 mg/dL (ref 0.2–1.2)
Total Protein: 6.7 g/dL (ref 6.0–8.3)

## 2020-05-09 LAB — LIPID PANEL
Cholesterol: 130 mg/dL (ref 0–200)
HDL: 40.8 mg/dL (ref >39.00)
LDL Cholesterol: 65 mg/dL (ref 0–99)
NonHDL: 89.48
Total CHOL/HDL Ratio: 3
Triglycerides: 123 mg/dL (ref 0.0–149.0)
VLDL: 24.6 mg/dL (ref 0.0–40.0)

## 2020-05-09 LAB — HEMOGLOBIN A1C: Hgb A1c MFr Bld: 6.9 % — ABNORMAL HIGH (ref 4.6–6.5)

## 2020-05-09 NOTE — Telephone Encounter (Signed)
Pt received covid Moderna booster shot on 11.30.21  

## 2020-05-09 NOTE — Telephone Encounter (Signed)
Noted  

## 2020-05-10 DIAGNOSIS — M19041 Primary osteoarthritis, right hand: Secondary | ICD-10-CM | POA: Diagnosis not present

## 2020-05-10 DIAGNOSIS — M19042 Primary osteoarthritis, left hand: Secondary | ICD-10-CM | POA: Diagnosis not present

## 2020-05-10 DIAGNOSIS — H524 Presbyopia: Secondary | ICD-10-CM | POA: Diagnosis not present

## 2020-05-10 DIAGNOSIS — Z7984 Long term (current) use of oral hypoglycemic drugs: Secondary | ICD-10-CM | POA: Diagnosis not present

## 2020-05-10 DIAGNOSIS — H401131 Primary open-angle glaucoma, bilateral, mild stage: Secondary | ICD-10-CM | POA: Diagnosis not present

## 2020-05-10 DIAGNOSIS — H52203 Unspecified astigmatism, bilateral: Secondary | ICD-10-CM | POA: Diagnosis not present

## 2020-05-10 DIAGNOSIS — E119 Type 2 diabetes mellitus without complications: Secondary | ICD-10-CM | POA: Diagnosis not present

## 2020-05-10 DIAGNOSIS — H353131 Nonexudative age-related macular degeneration, bilateral, early dry stage: Secondary | ICD-10-CM | POA: Diagnosis not present

## 2020-05-10 DIAGNOSIS — Z961 Presence of intraocular lens: Secondary | ICD-10-CM | POA: Diagnosis not present

## 2020-05-10 LAB — HM DIABETES EYE EXAM

## 2020-05-11 MED ORDER — SYMBICORT 160-4.5 MCG/ACT IN AERO: 2.0000 | INHALATION_SPRAY | Freq: Two times a day (BID) | RESPIRATORY_TRACT | 0 refills | Status: DC

## 2020-05-11 MED ORDER — AMLODIPINE BESYLATE 2.5 MG PO TABS: 2.5000 mg | ORAL_TABLET | Freq: Every day | ORAL | 0 refills | Status: DC

## 2020-05-11 NOTE — Addendum Note (Signed)
Addended by: Leonidas Romberg on: 05/11/2020 04:10 PM   Modules accepted: Orders

## 2020-05-13 DIAGNOSIS — M19041 Primary osteoarthritis, right hand: Secondary | ICD-10-CM | POA: Diagnosis not present

## 2020-05-13 DIAGNOSIS — M19042 Primary osteoarthritis, left hand: Secondary | ICD-10-CM | POA: Diagnosis not present

## 2020-05-16 DIAGNOSIS — M19041 Primary osteoarthritis, right hand: Secondary | ICD-10-CM | POA: Diagnosis not present

## 2020-05-16 DIAGNOSIS — M19042 Primary osteoarthritis, left hand: Secondary | ICD-10-CM | POA: Diagnosis not present

## 2020-05-18 DIAGNOSIS — M19041 Primary osteoarthritis, right hand: Secondary | ICD-10-CM | POA: Diagnosis not present

## 2020-05-18 DIAGNOSIS — M19042 Primary osteoarthritis, left hand: Secondary | ICD-10-CM | POA: Diagnosis not present

## 2020-05-23 DIAGNOSIS — M19041 Primary osteoarthritis, right hand: Secondary | ICD-10-CM | POA: Diagnosis not present

## 2020-05-23 DIAGNOSIS — M19042 Primary osteoarthritis, left hand: Secondary | ICD-10-CM | POA: Diagnosis not present

## 2020-05-25 DIAGNOSIS — M19041 Primary osteoarthritis, right hand: Secondary | ICD-10-CM | POA: Diagnosis not present

## 2020-05-25 DIAGNOSIS — M19042 Primary osteoarthritis, left hand: Secondary | ICD-10-CM | POA: Diagnosis not present

## 2020-06-11 ENCOUNTER — Other Ambulatory Visit: Payer: Self-pay | Admitting: Family Medicine

## 2020-06-18 ENCOUNTER — Other Ambulatory Visit: Payer: Self-pay | Admitting: Family Medicine

## 2020-06-20 ENCOUNTER — Other Ambulatory Visit: Payer: Self-pay | Admitting: Family Medicine

## 2020-06-25 ENCOUNTER — Other Ambulatory Visit: Payer: Self-pay | Admitting: Family Medicine

## 2020-06-25 DIAGNOSIS — N183 Chronic kidney disease, stage 3 unspecified: Secondary | ICD-10-CM

## 2020-06-25 DIAGNOSIS — E1122 Type 2 diabetes mellitus with diabetic chronic kidney disease: Secondary | ICD-10-CM

## 2020-07-11 DIAGNOSIS — L570 Actinic keratosis: Secondary | ICD-10-CM | POA: Diagnosis not present

## 2020-07-11 DIAGNOSIS — L821 Other seborrheic keratosis: Secondary | ICD-10-CM | POA: Diagnosis not present

## 2020-07-11 DIAGNOSIS — D2239 Melanocytic nevi of other parts of face: Secondary | ICD-10-CM | POA: Diagnosis not present

## 2020-07-18 ENCOUNTER — Other Ambulatory Visit: Payer: Self-pay | Admitting: Family Medicine

## 2020-07-21 ENCOUNTER — Other Ambulatory Visit: Payer: Self-pay | Admitting: Family Medicine

## 2020-08-01 ENCOUNTER — Other Ambulatory Visit: Payer: Self-pay | Admitting: Family Medicine

## 2020-08-08 ENCOUNTER — Other Ambulatory Visit: Payer: Self-pay | Admitting: Family Medicine

## 2020-08-11 DIAGNOSIS — H401131 Primary open-angle glaucoma, bilateral, mild stage: Secondary | ICD-10-CM | POA: Diagnosis not present

## 2020-08-11 LAB — HM DIABETES EYE EXAM

## 2020-08-27 ENCOUNTER — Other Ambulatory Visit: Payer: Self-pay | Admitting: Family Medicine

## 2020-09-12 ENCOUNTER — Other Ambulatory Visit: Payer: Self-pay | Admitting: Family Medicine

## 2020-09-19 ENCOUNTER — Other Ambulatory Visit: Payer: Self-pay | Admitting: Family Medicine

## 2020-09-19 DIAGNOSIS — E1122 Type 2 diabetes mellitus with diabetic chronic kidney disease: Secondary | ICD-10-CM

## 2020-10-06 ENCOUNTER — Other Ambulatory Visit: Payer: Self-pay | Admitting: Family Medicine

## 2020-10-11 ENCOUNTER — Other Ambulatory Visit: Payer: Self-pay | Admitting: Family Medicine

## 2020-10-11 ENCOUNTER — Other Ambulatory Visit: Payer: Self-pay

## 2020-10-11 IMAGING — CR DG NECK SOFT TISSUE
2 series · 2 of 2 positions shown · non-contrast
Comparison: None.

CLINICAL DATA: Globus sensation of the throat.

EXAM:
NECK SOFT TISSUES - 1+ VIEW

[neck lat]
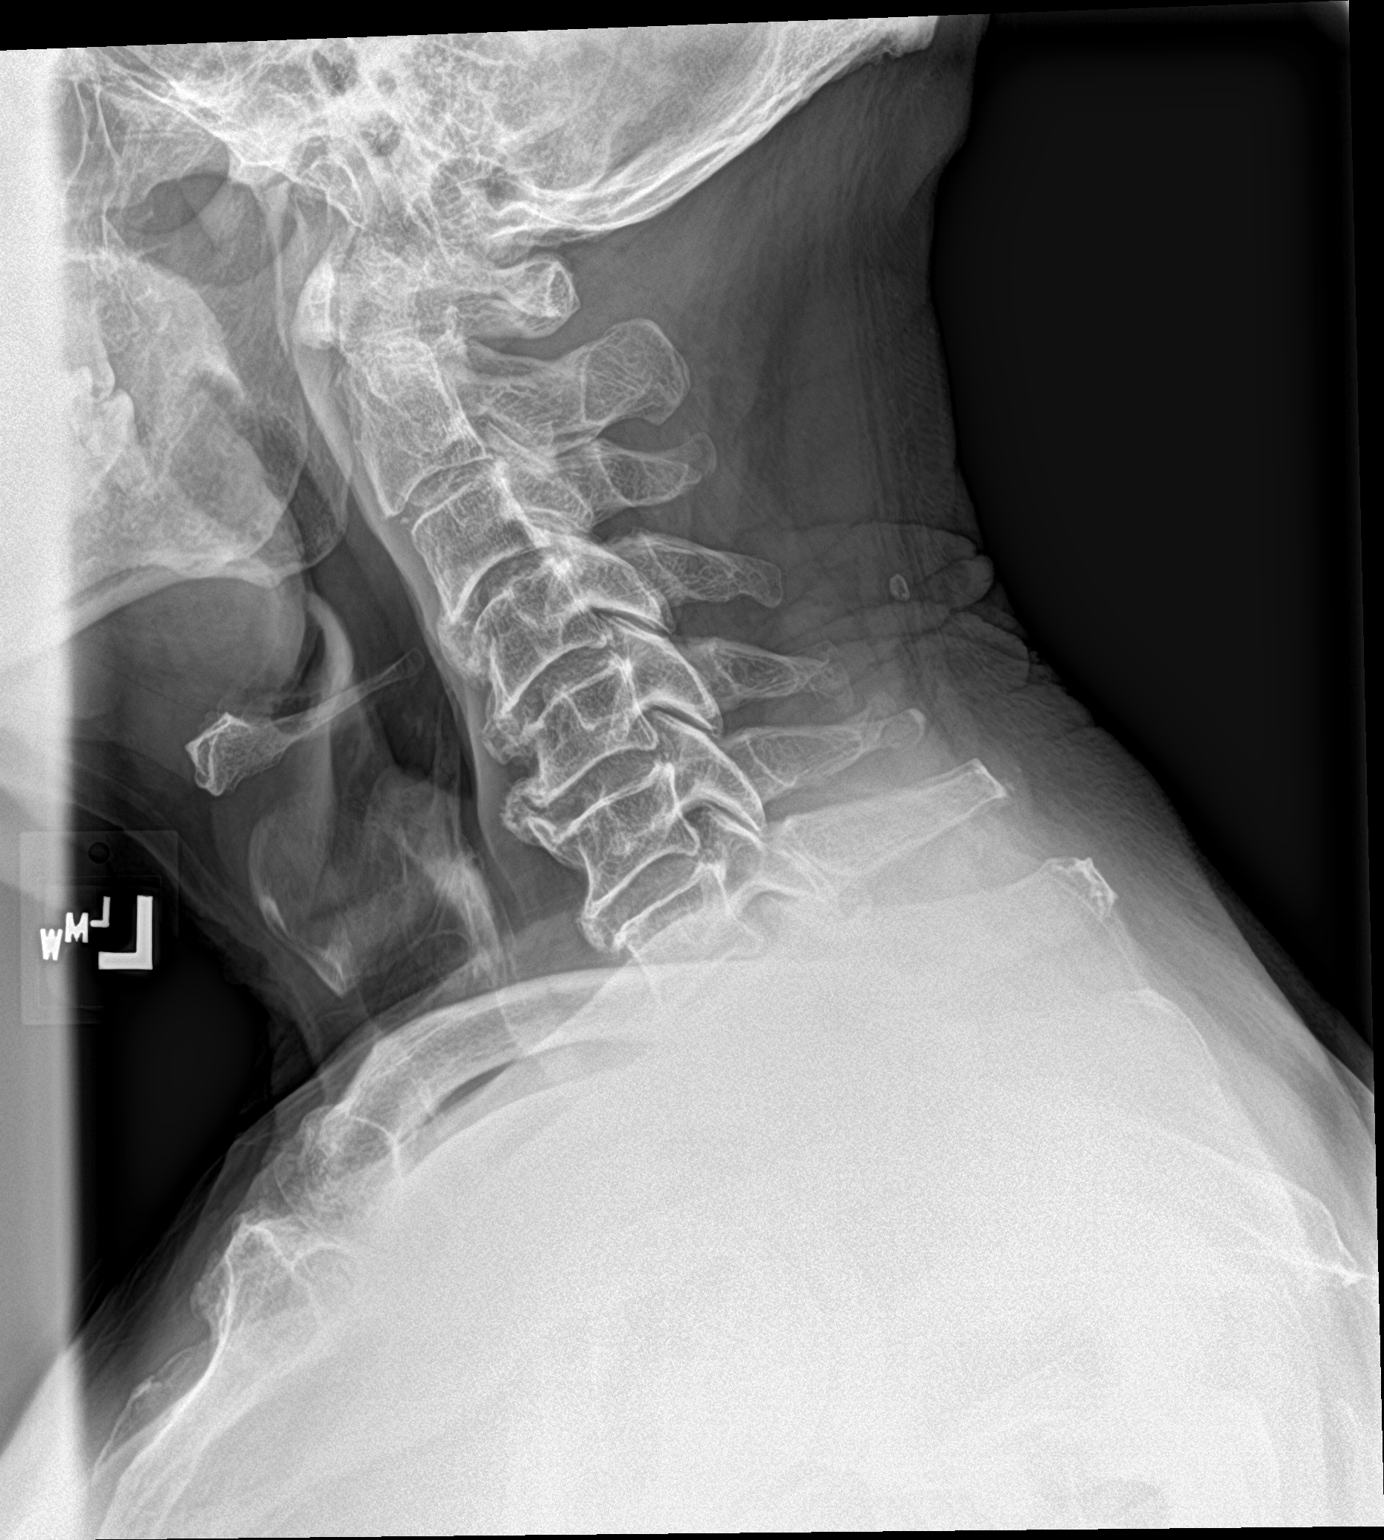

[neck ap]
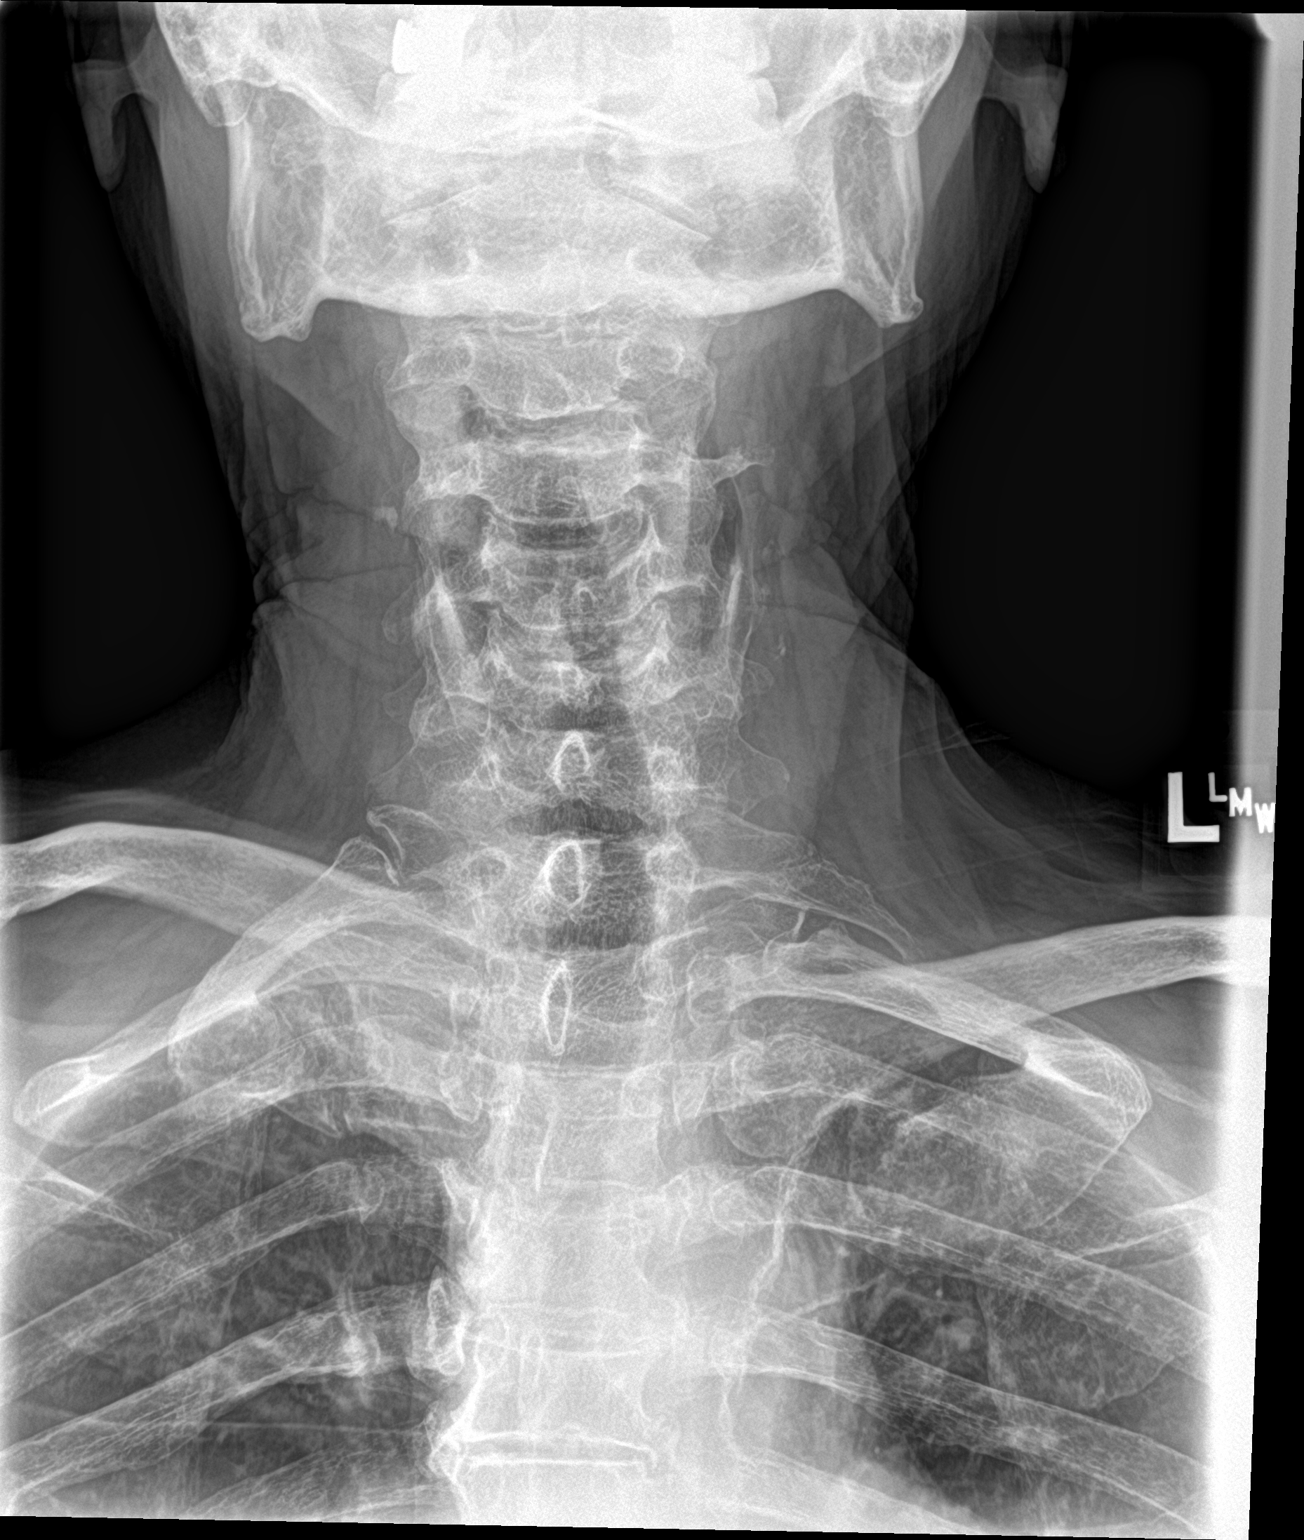

[2 of 2 positions shown; findings below may reference images not displayed]

FINDINGS: There is no evidence of retropharyngeal soft tissue swelling or
epiglottic enlargement. The cervical airway is unremarkable and no
radio-opaque foreign body identified. No soft tissue emphysema is
identified about the soft tissues of the neck. There is cervical
spondylosis with slight reversal cervical lordosis at C4-5.
Osteophytes are noted anteriorly which may contribute to the globus
sensation from C3 through C7 greatest at C5-6.
IMPRESSION: 1. Unremarkable appearance of the soft tissues of the neck.
2. Cervical spondylosis with slight reversal of cervical lordosis at
C4-5.
3. Prominent anterior osteophytes from C3 through C7, greatest
across the C5-6 interspace and which may contribute to the globus
sensation.

## 2020-10-17 ENCOUNTER — Other Ambulatory Visit: Payer: Self-pay | Admitting: Family Medicine

## 2020-10-20 DIAGNOSIS — Z87442 Personal history of urinary calculi: Secondary | ICD-10-CM | POA: Diagnosis not present

## 2020-10-24 ENCOUNTER — Other Ambulatory Visit: Payer: Self-pay | Admitting: Family Medicine

## 2020-10-31 ENCOUNTER — Other Ambulatory Visit: Payer: Self-pay | Admitting: Family Medicine

## 2020-11-09 NOTE — Progress Notes (Signed)
Phone [phone] In person visit   Subjective:   Roy [name] is a 87 y.o. year old very pleasant male patient who presents for/with See problem oriented charting Chief Complaint  Patient presents with   6 months F/U    Pt states that has right middle finger is still getting stuck and will not straighten out.     This visit occurred during the SARS-CoV-2 public health emergency.  Safety protocols were in place, including screening questions prior to the visit, additional usage of staff PPE, and extensive cleaning of exam room while observing appropriate contact time as indicated for disinfecting solutions.   Past Medical History-  Patient Active Problem List   Diagnosis Date Noted   Type II diabetes mellitus with renal manifestations ([place]) 09/11/2006    Priority: High   Coronary artery disease with angina pectoris ([place]) 09/11/2006    Priority: High   Thrombocytopenia ([place]) 02/23/2015    Priority: Medium   Mild persistent asthma 10/30/2010    Priority: Medium   CKD (chronic kidney disease), stage III ([place]) 02/22/2009    Priority: Medium   Hyperlipidemia associated with type 2 diabetes mellitus ([place]) 09/11/2006    Priority: Medium   Hypertension associated with diabetes ([place]) 09/11/2006    Priority: Medium   Back pain 09/11/2006    Priority: Medium   Sleep apnea 09/11/2006    Priority: Medium   Senile purpura ([place]) 08/08/2017    Priority: Low   GERD (gastroesophageal reflux disease) 05/03/2014    Priority: Low   Insomnia 05/03/2014    Priority: Low   Allergic rhinitis 04/21/2014    Priority: Low   Giardia 04/21/2014    Priority: Low   NEPHROLITHIASIS, RECURRENT 01/21/2009    Priority: Low   ACTINIC KERATOSIS, HEAD 06/14/2008    Priority: Low   Right rotator cuff tear 12/11/2018   Calcific tendinitis of right shoulder 09/03/2018   AC (acromioclavicular) arthritis 09/03/2018   Pain of right thumb 04/08/2018   Food impaction of esophagus 04/03/2018     Medications- reviewed and updated Current Outpatient Medications  Medication Sig Dispense Refill   acetaminophen (TYLENOL) 325 MG tablet Take 650 mg by mouth every evening. Reported on 06/28/2015     albuterol (PROVENTIL HFA;VENTOLIN HFA) 108 (90 Base) MCG/ACT inhaler Inhale 2 puffs into the lungs every 4 (four) hours as needed for wheezing or shortness of breath. 1 Inhaler 10   amLODipine (NORVASC) 2.5 MG tablet Take 1 tablet by mouth once daily 90 tablet 0   aspirin EC 81 MG tablet Take 1 tablet (81 mg total) by mouth daily.     ASSURE COMFORT LANCETS 30G MISC 1 each by Other route daily.     atorvastatin (LIPITOR) 10 MG tablet Take 1 tablet by mouth once daily 90 tablet 0   calcium-vitamin D (OSCAL WITH D) 500-200 MG-UNIT per tablet Take 1 tablet by mouth daily.     glimepiride (AMARYL) 4 MG tablet TAKE 1 & 1/2 (ONE & ONE-HALF) TABLETS BY MOUTH DAILY 135 tablet 0   glucose blood (ACCU-CHEK AVIVA PLUS) test strip Test blood glucose daily. 100 each 12   guaiFENesin (MUCINEX) 600 MG 12 hr tablet Take by mouth 2 (two) times daily.     isosorbide mononitrate (IMDUR) 30 MG 24 hr tablet Take 3 tablets by mouth once daily 270 tablet 0   latanoprost (XALATAN) 0.005 % ophthalmic solution INSTILL 1 DROP INTO EACH EYE NIGHTLY     lisinopril (ZESTRIL) 5 MG tablet Take 1/2 (  one-half) tablet by mouth once daily 45 tablet 0   metFORMIN (GLUCOPHAGE) 500 MG tablet TAKE 1/2 (ONE-HALF) TABLET BY MOUTH ONCE DAILY WITH BREAKFAST 45 tablet 0   metoprolol succinate (TOPROL-XL) 50 MG 24 hr tablet TAKE 1 TABLET BY MOUTH ONCE DAILY (TAKE  WITH  OR  IMMEDIATELY  FOLLOWING  A  MEAL) 90 tablet 0   nitroGLYCERIN (NITROSTAT) 0.4 MG SL tablet DISSOLVE ONE TABLET UNDER THE TONGUE EVERY FIVE MINUTES AS NEEDED FOR CHEST PAIN 25 tablet 0   Omega-3 Fatty Acids (FISH OIL PO) Take 2 capsules by mouth 2 (two) times daily.     Polyethyl Glycol-Propyl Glycol (SYSTANE OP) Place 1 drop into both eyes 4 (four) times daily. Itchy/dry  eye     potassium citrate (UROCIT-K) 10 MEQ (1080 MG) SR tablet Take 10 mEq by mouth Twice daily.     ranolazine (RANEXA) 500 MG 12 hr tablet TAKE 1 TABLET BY MOUTH TWICE DAILY. KEEP UPCOMING APPOINTMENT IN OCTOBER 180 tablet 3   SYMBICORT 160-4.5 MCG/ACT inhaler Inhale 2 puffs by mouth twice daily 11 g 0   traMADol (ULTRAM) 50 MG tablet TAKE 1 TABLET BY MOUTH EVERY 6 HOURS AS NEEDED FOR  MODERATE  PAIN  (MAX  OF  TWICE  DAILY) 60 tablet 0   zinc sulfate 220 MG capsule Take 220 mg by mouth daily.     omeprazole (PRILOSEC) 20 MG capsule Take 1 capsule (20 mg total) by mouth 2 (two) times daily. 180 capsule 3   traZODone (DESYREL) 150 MG tablet Take 1 tablet (150 mg total) by mouth daily. 90 tablet 3   No current facility-administered medications for this visit.     Objective:  BP 130/68   Pulse (!) 58   Temp 98.4 F (36.9 C)   Resp 16   Wt 190 lb 9.6 oz (86.5 kg)   SpO2 99%   BMI 28.15 kg/m  Gen: NAD, resting comfortably CV: bradycardic but regular no rubs or gallops Lungs: CTAB no crackles, wheeze, rhonchi Abdomen: soft/nontender/nondistended/normal bowel sounds. No rebound or guarding.  Ext: minimal  edema Skin: warm, dry     Assessment and Plan   #Trigger finger-patient has noted right middle finger has been getting stuck and will not straighten out completely-issues for about a year. Working with Dr. Tamera Punt with orthopedics (who did his shoulder). Working with PT and will come out after session but as soon as he gets home gets stuck back in position. Has not had injections before. Splint was not helpful.   # Diabetes S: Medication:compliant with Metformin 500mg  half tablet with breakfast, Glimepiride 6 Mg daily (only taking 4 mg right now) - continues to stay very active Lab Results  Component Value Date   HGBA1C 6.9 (H) 05/09/2020   HGBA1C 6.6 (H) 10/29/2019   HGBA1C 6.7 (H) 04/28/2019   A/P: Well-controlled on last check-update A1c with labs today. Likely continue  current meds  #CAD with angina/hyperlipidemia S: Medication:compliant with atorvastatin 10 mg daily and fish oil twice daily and aspirin 81mg  daily   - LDL goal <70 .  Remains asymptomatic while on ranexa 500 mg twice daily and Imdur 90 mg daily . Also on metoprolol  XR 50 mg daily- updated lipids -well-controlled on last check in March Lab Results  Component Value Date   CHOL 130 05/09/2020   HDL 40.80 05/09/2020   LDLCALC 65 05/09/2020   LDLDIRECT 55.0 12/30/2018   TRIG 123.0 05/09/2020   CHOLHDL 3 05/09/2020   A/P:  CAD remains asymptomatic-continue current medication  For hyperlipidemia LDL at goal 70 or less-continue current medication. Update full lipid panel next visit   #hypertension/CKD III S: medication: Compliant with Amlodipine 2.5 mg daily, Lisinopril 5 mg ( take 2.5 mg of pill) daily , Metoprolol XL 50 mg daily and Imdur  90 Mg daily -on lisinopril in case proteinuric element. GFR usually 30-40 range- kidney function stable when saw nephrology recently Home readings #s: reports similar readings at home BP Readings from Last 3 Encounters:  11/14/20 130/68  05/09/20 138/80  01/26/20 136/70  A/P: blood pressure- Controlled. Continue current medications.  CKD stable -saw nephrology a month ago and got a good report. Update CMP today  # Asthma S: Maintenance Medication: compliant with Symbicort 160-4.5 mcg, 1-2 puffs twice daily depending on activity.  uses Albuterol HFA 108 mcg, as needed Patient is using this 0 x per week mostly A/P: Controlled. Continue current medications.    # GERD S:Medication: Omeprazole 20mg  twice daily - last visit was to see if Omeprazole just once a day wold control symptoms- didn't work B12 levels related to PPI use: Lab Results  Component Value Date   [id number] 818 09/02/2018  A/P: poor control on once a day- sill remain on twice a day- new rx sent ing   #history of kidney stones- remained on potassium citrate through urology     #chronic pain- Tramadol takes a mild edge off of pain (recently mainly needing for trigger finger)  was willing to refill  -also in therapy for his hands for arthritis- had some help   #insomnia- trazodone helped for sleep - willing to refill   #HM-recently had flu shot and Prevnar 20 at pharmacy  Recommended follow up: 6 mo CPE Future Appointments  Date Time Provider [place]  12/28/2020  9:20 AM Sueanne Margarita, MD [facility]  01/02/2021  3:15 PM [facility]   Lab/Order associations:   ICD-10-CM   1. Hypertension associated with diabetes ([place])  E11.59    I15.2     2. Type 2 diabetes mellitus with diabetic nephropathy, without long-term current use of insulin (HCC)  E11.21 CBC with Differential/Platelet    Comprehensive metabolic panel    Hemoglobin A1c    3. Hyperlipidemia associated with type 2 diabetes mellitus ([place])  E11.69    E78.5     4. Coronary artery disease involving native coronary artery of native heart with angina pectoris ([place])  I25.119     5. Stage 3 chronic kidney disease, unspecified whether stage 3a or 3b CKD (HCC)  N18.30     6. Mild persistent asthma without complication  E99.37     7. High risk medication use  Z79.899 Vitamin B12    8. Need for influenza vaccination  Z23 CANCELED: Flu Vaccine QUAD 44mo+IM (Fluarix, Fluzone & Alfiuria Quad PF)    9. Gastroesophageal reflux disease without esophagitis  K21.9       Meds ordered this encounter  Medications   omeprazole (PRILOSEC) 20 MG capsule    Sig: Take 1 capsule (20 mg total) by mouth 2 (two) times daily.    Dispense:  180 capsule    Refill:  3   traZODone (DESYREL) 150 MG tablet    Sig: Take 1 tablet (150 mg total) by mouth daily.    Dispense:  90 tablet    Refill:  3     I,Jada Bradford,acting as a scribe for [name], MD.,have documented all relevant documentation on  the behalf of [name], MD,as directed by  [name], MD  while in the presence of [name], MD.  I, [name], MD, have reviewed all documentation for this visit. The documentation on 11/14/20 for the exam, diagnosis, procedures, and orders are all accurate and complete.  Return precautions advised.  [name], MD

## 2020-11-14 ENCOUNTER — Ambulatory Visit (INDEPENDENT_AMBULATORY_CARE_PROVIDER_SITE_OTHER): Payer: PPO | Admitting: Family Medicine

## 2020-11-14 ENCOUNTER — Encounter: Payer: Self-pay | Admitting: Family Medicine

## 2020-11-14 ENCOUNTER — Other Ambulatory Visit: Payer: Self-pay

## 2020-11-14 VITALS — BP 130/68 | HR 58 | Temp 98.4°F | Resp 16 | Wt 190.6 lb

## 2020-11-14 DIAGNOSIS — Z79899 Other long term (current) drug therapy: Secondary | ICD-10-CM

## 2020-11-14 DIAGNOSIS — N183 Chronic kidney disease, stage 3 unspecified: Secondary | ICD-10-CM

## 2020-11-14 DIAGNOSIS — E1121 Type 2 diabetes mellitus with diabetic nephropathy: Secondary | ICD-10-CM

## 2020-11-14 DIAGNOSIS — E785 Hyperlipidemia, unspecified: Secondary | ICD-10-CM | POA: Diagnosis not present

## 2020-11-14 DIAGNOSIS — L814 Other melanin hyperpigmentation: Secondary | ICD-10-CM | POA: Diagnosis not present

## 2020-11-14 DIAGNOSIS — K219 Gastro-esophageal reflux disease without esophagitis: Secondary | ICD-10-CM

## 2020-11-14 DIAGNOSIS — E1159 Type 2 diabetes mellitus with other circulatory complications: Secondary | ICD-10-CM | POA: Diagnosis not present

## 2020-11-14 DIAGNOSIS — Z23 Encounter for immunization: Secondary | ICD-10-CM

## 2020-11-14 DIAGNOSIS — E1169 Type 2 diabetes mellitus with other specified complication: Secondary | ICD-10-CM | POA: Diagnosis not present

## 2020-11-14 DIAGNOSIS — J4530 Mild persistent asthma, uncomplicated: Secondary | ICD-10-CM | POA: Diagnosis not present

## 2020-11-14 DIAGNOSIS — L570 Actinic keratosis: Secondary | ICD-10-CM | POA: Diagnosis not present

## 2020-11-14 DIAGNOSIS — I25119 Atherosclerotic heart disease of native coronary artery with unspecified angina pectoris: Secondary | ICD-10-CM

## 2020-11-14 DIAGNOSIS — L821 Other seborrheic keratosis: Secondary | ICD-10-CM | POA: Diagnosis not present

## 2020-11-14 DIAGNOSIS — I152 Hypertension secondary to endocrine disorders: Secondary | ICD-10-CM

## 2020-11-14 LAB — CBC WITH DIFFERENTIAL/PLATELET
Basophils Absolute: 0.1 10*3/uL (ref 0.0–0.1)
Basophils Relative: 1 % (ref 0.0–3.0)
Eosinophils Absolute: 0.5 10*3/uL (ref 0.0–0.7)
Eosinophils Relative: 7.4 % — ABNORMAL HIGH (ref 0.0–5.0)
HCT: 37.1 % — ABNORMAL LOW (ref 39.0–52.0)
Hemoglobin: 12.3 g/dL — ABNORMAL LOW (ref 13.0–17.0)
Lymphocytes Relative: 18.6 % (ref 12.0–46.0)
Lymphs Abs: 1.2 10*3/uL (ref 0.7–4.0)
MCHC: 33.2 g/dL (ref 30.0–36.0)
MCV: 87.5 fl (ref 78.0–100.0)
Monocytes Absolute: 0.5 10*3/uL (ref 0.1–1.0)
Monocytes Relative: 7.8 % (ref 3.0–12.0)
Neutro Abs: 4.3 10*3/uL (ref 1.4–7.7)
Neutrophils Relative %: 65.2 % (ref 43.0–77.0)
Platelets: 128 10*3/uL — ABNORMAL LOW (ref 150.0–400.0)
RBC: 4.25 Mil/uL (ref 4.22–5.81)
RDW: 14.3 % (ref 11.5–15.5)
WBC: 6.7 10*3/uL (ref 4.0–10.5)

## 2020-11-14 LAB — COMPREHENSIVE METABOLIC PANEL
ALT: 11 U/L (ref 0–53)
AST: 10 U/L (ref 0–37)
Albumin: 4.2 g/dL (ref 3.5–5.2)
Alkaline Phosphatase: 79 U/L (ref 39–117)
BUN: 31 mg/dL — ABNORMAL HIGH (ref 6–23)
CO2: 27 mEq/L (ref 19–32)
Calcium: 9.4 mg/dL (ref 8.4–10.5)
Chloride: 102 mEq/L (ref 96–112)
Creatinine, Ser: 2.01 mg/dL — ABNORMAL HIGH (ref 0.40–1.50)
GFR: 29.59 mL/min — ABNORMAL LOW (ref >60.00)
Glucose, Bld: 193 mg/dL — ABNORMAL HIGH (ref 70–99)
Potassium: 4.6 mEq/L (ref 3.5–5.1)
Sodium: 138 mEq/L (ref 135–145)
Total Bilirubin: 0.7 mg/dL (ref 0.2–1.2)
Total Protein: 6.6 g/dL (ref 6.0–8.3)

## 2020-11-14 LAB — HEMOGLOBIN A1C: Hgb A1c MFr Bld: 7.1 % — ABNORMAL HIGH (ref 4.6–6.5)

## 2020-11-14 LAB — VITAMIN B12: Vitamin B-12: 512 pg/mL (ref 211–911)

## 2020-11-14 MED ORDER — OMEPRAZOLE 20 MG PO CPDR: 20.0000 mg | DELAYED_RELEASE_CAPSULE | Freq: Two times a day (BID) | ORAL | 3 refills | Status: DC

## 2020-11-14 MED ORDER — TRAZODONE HCL 150 MG PO TABS: 150.0000 mg | ORAL_TABLET | Freq: Every day | ORAL | 3 refills | Status: DC

## 2020-11-14 NOTE — Patient Instructions (Addendum)
Health Maintenance Due  Topic Date Due   OPHTHALMOLOGY EXAM   - Please send [place] a copy.  01/14/2019   Call Dr.Chandler's office and tell him you're having continuing issues with trigger finger.   Please stop by lab before you go If you have mychart- we will send your results within 3 business days of [place] receiving them.  If you do not have mychart- we will call you about results within 5 business days of [place] receiving them.  *please also note that you will see labs on mychart as soon as they post. I will later go in and write notes on them- will say "notes from Dr. Yong Channel"  Recommended follow up: Return in about 6 months (around 05/14/2021) for physical or sooner if needed.[name]

## 2020-11-15 DIAGNOSIS — H401131 Primary open-angle glaucoma, bilateral, mild stage: Secondary | ICD-10-CM | POA: Diagnosis not present

## 2020-11-18 DIAGNOSIS — M79642 Pain in left hand: Secondary | ICD-10-CM | POA: Diagnosis not present

## 2020-11-18 DIAGNOSIS — M79641 Pain in right hand: Secondary | ICD-10-CM | POA: Diagnosis not present

## 2020-11-18 DIAGNOSIS — M65331 Trigger finger, right middle finger: Secondary | ICD-10-CM | POA: Diagnosis not present

## 2020-11-21 ENCOUNTER — Other Ambulatory Visit: Payer: Self-pay | Admitting: Family Medicine

## 2020-12-06 ENCOUNTER — Other Ambulatory Visit: Payer: Self-pay | Admitting: Family Medicine

## 2020-12-13 ENCOUNTER — Other Ambulatory Visit: Payer: Self-pay | Admitting: Family Medicine

## 2020-12-13 DIAGNOSIS — M65331 Trigger finger, right middle finger: Secondary | ICD-10-CM | POA: Diagnosis not present

## 2020-12-13 DIAGNOSIS — M25541 Pain in joints of right hand: Secondary | ICD-10-CM | POA: Diagnosis not present

## 2020-12-20 ENCOUNTER — Other Ambulatory Visit: Payer: Self-pay | Admitting: Family Medicine

## 2020-12-20 DIAGNOSIS — E1122 Type 2 diabetes mellitus with diabetic chronic kidney disease: Secondary | ICD-10-CM

## 2020-12-28 ENCOUNTER — Other Ambulatory Visit: Payer: Self-pay

## 2020-12-28 ENCOUNTER — Ambulatory Visit: Payer: PPO | Admitting: Cardiology

## 2020-12-28 ENCOUNTER — Encounter: Payer: Self-pay | Admitting: Cardiology

## 2020-12-28 VITALS — BP 140/78 | HR 66 | Ht 69.0 in | Wt 189.0 lb

## 2020-12-28 DIAGNOSIS — E1169 Type 2 diabetes mellitus with other specified complication: Secondary | ICD-10-CM

## 2020-12-28 DIAGNOSIS — E1159 Type 2 diabetes mellitus with other circulatory complications: Secondary | ICD-10-CM | POA: Diagnosis not present

## 2020-12-28 DIAGNOSIS — E785 Hyperlipidemia, unspecified: Secondary | ICD-10-CM

## 2020-12-28 DIAGNOSIS — I152 Hypertension secondary to endocrine disorders: Secondary | ICD-10-CM | POA: Diagnosis not present

## 2020-12-28 DIAGNOSIS — I10 Essential (primary) hypertension: Secondary | ICD-10-CM

## 2020-12-28 DIAGNOSIS — I25119 Atherosclerotic heart disease of native coronary artery with unspecified angina pectoris: Secondary | ICD-10-CM

## 2020-12-28 DIAGNOSIS — N1832 Chronic kidney disease, stage 3b: Secondary | ICD-10-CM

## 2020-12-28 MED ORDER — RANOLAZINE ER 500 MG PO TB12: ORAL_TABLET | ORAL | 3 refills | Status: DC

## 2020-12-28 MED ORDER — LISINOPRIL 5 MG PO TABS: ORAL_TABLET | ORAL | 3 refills | Status: DC

## 2020-12-28 MED ORDER — ATORVASTATIN CALCIUM 10 MG PO TABS: 10.0000 mg | ORAL_TABLET | Freq: Every day | ORAL | 3 refills | Status: DC

## 2020-12-28 MED ORDER — METOPROLOL SUCCINATE ER 50 MG PO TB24: ORAL_TABLET | ORAL | 3 refills | Status: DC

## 2020-12-28 MED ORDER — ISOSORBIDE MONONITRATE ER 30 MG PO TB24: 90.0000 mg | ORAL_TABLET | Freq: Every day | ORAL | 3 refills | Status: DC

## 2020-12-28 MED ORDER — AMLODIPINE BESYLATE 2.5 MG PO TABS: 2.5000 mg | ORAL_TABLET | Freq: Every day | ORAL | 3 refills | Status: DC

## 2020-12-28 NOTE — Progress Notes (Signed)
Date:  12/28/2020   ID:  Roy [name], DOB 10-09-1932, MRN [id number]  PCP:  Marin Olp, MD  Cardiologist: Fransico Him, MD Electrophysiologist:  None   Chief Complaint:  CAD, HTN, OSA  History of Present Illness:    Roy [name] is a 87 y.o. male with a hx of CAD s/p PTCA D1 in 1999 with repeat cath 12/2011 with nonobstructive dz (CP felt secondary to small vessel dz), CKD, diabetes, HTN, OSA intolerant to CPAP and hyperlipidemia.    He is here today for followup and is doing well.  He denies any chest pain or pressure, SOB, DOE, PND, orthopnea, LE edema, dizziness, palpitations or syncope. He is compliant with his meds and is tolerating meds with no SE.     Prior CV studies:   The following studies were reviewed today:  EKG  Past Medical History:  Diagnosis Date   Anginal pain ([place])    Arthritis    spine   Asthma    CAD (coronary artery disease)    s/p PCI of the D1 with cath in 2013 showing nonobstructive disease.  CP presumed due to small vessel disease.   CAP (community acquired pneumonia) 12/26/2011   Chronic kidney disease    renal insufficiency   COPD (chronic obstructive pulmonary disease) (HCC)    Diabetes mellitus, type 2 ([place])    Fever 12/26/2011   GERD (gastroesophageal reflux disease)    Heart murmur    Hyperlipidemia    Hypertension    Lyme disease    states had shakes that were thought to be parkinson's related but related to tick bite, states also RMSF   Myocardial infarction [facility])    Right rotator cuff tear    Shortness of breath    Sleep apnea    intolerant to PAP   Past Surgical History:  Procedure Laterality Date   APPENDECTOMY     CARDIAC CATHETERIZATION  2013   CERVICAL LAMINECTOMY     CHOLECYSTECTOMY     ESOPHAGOGASTRODUODENOSCOPY N/A 04/04/2018   Procedure: ESOPHAGOGASTRODUODENOSCOPY (EGD);  Surgeon: Lin Landsman, MD;  Location[facility] ENDOSCOPY;  Service: Gastroenterology;  Laterality: N/A;   KIDNEY STONE SURGERY      LEFT HEART CATH N/A 02/15/2012   Procedure: LEFT HEART CATH;  Surgeon: Larey Dresser, MD;  Location: [facility] CATH LAB;  Service: Cardiovascular;  Laterality: N/A;   SHOULDER ARTHROSCOPY WITH ROTATOR CUFF REPAIR AND SUBACROMIAL DECOMPRESSION Right 01/20/2019   Procedure: SHOULDER ARTHROSCOPY WITH ROTATOR CUFF REPAIR AND SUBACROMIAL DECOMPRESSION, BICEPS TENOTOMY;  Surgeon: [name], MD;  Location: [facility];  Service: Orthopedics;  Laterality: Right;     Current Meds  Medication Sig   acetaminophen (TYLENOL) 325 MG tablet Take 650 mg by mouth every evening. Reported on 06/28/2015   albuterol (PROVENTIL HFA;VENTOLIN HFA) 108 (90 Base) MCG/ACT inhaler Inhale 2 puffs into the lungs every 4 (four) hours as needed for wheezing or shortness of breath.   amLODipine (NORVASC) 2.5 MG tablet Take 1 tablet by mouth once daily   aspirin EC 81 MG tablet Take 1 tablet (81 mg total) by mouth daily.   ASSURE COMFORT LANCETS 30G MISC 1 each by Other route daily.   atorvastatin (LIPITOR) 10 MG tablet Take 1 tablet by mouth once daily   calcium-vitamin D (OSCAL WITH D) 500-200 MG-UNIT per tablet Take 1 tablet by mouth daily.   glimepiride (AMARYL) 4 MG tablet TAKE 1 & 1/2 (ONE & ONE-HALF) TABLETS BY MOUTH ONCE DAILY  glucose blood (ACCU-CHEK AVIVA PLUS) test strip Test blood glucose daily.   guaiFENesin (MUCINEX) 600 MG 12 hr tablet Take by mouth 2 (two) times daily.   isosorbide mononitrate (IMDUR) 30 MG 24 hr tablet Take 3 tablets by mouth once daily   latanoprost (XALATAN) 0.005 % ophthalmic solution INSTILL 1 DROP INTO EACH EYE NIGHTLY   lisinopril (ZESTRIL) 5 MG tablet Take 1/2 (one-half) tablet by mouth once daily   metFORMIN (GLUCOPHAGE) 500 MG tablet TAKE 1/2 (ONE-HALF) TABLET BY MOUTH ONCE DAILY WITH BREAKFAST   metoprolol succinate (TOPROL-XL) 50 MG 24 hr tablet TAKE 1 TABLET BY MOUTH ONCE DAILY (TAKE  WITH  OR  IMMEDIATELY  FOLLOWING  A  MEAL)   nitroGLYCERIN (NITROSTAT) 0.4 MG SL  tablet DISSOLVE ONE TABLET UNDER THE TONGUE EVERY FIVE MINUTES AS NEEDED FOR CHEST PAIN   Omega-3 Fatty Acids (FISH OIL PO) Take 2 capsules by mouth 2 (two) times daily.   omeprazole (PRILOSEC) 20 MG capsule Take 1 capsule (20 mg total) by mouth 2 (two) times daily.   Polyethyl Glycol-Propyl Glycol (SYSTANE OP) Place 1 drop into both eyes 4 (four) times daily. Itchy/dry eye   potassium citrate (UROCIT-K) 10 MEQ (1080 MG) SR tablet Take 10 mEq by mouth Twice daily.   ranolazine (RANEXA) 500 MG 12 hr tablet TAKE 1 TABLET BY MOUTH TWICE DAILY. KEEP UPCOMING APPOINTMENT IN OCTOBER   SYMBICORT 160-4.5 MCG/ACT inhaler Inhale 2 puffs by mouth twice daily   traMADol (ULTRAM) 50 MG tablet TAKE 1 TABLET BY MOUTH EVERY 6 HOURS AS NEEDED FOR  MODERATE  PAIN  (MAX  OF  TWICE  DAILY)   traZODone (DESYREL) 150 MG tablet Take 1 tablet (150 mg total) by mouth daily.   zinc sulfate 220 MG capsule Take 220 mg by mouth daily.     Allergies:   Codeine sulfate, Crestor [rosuvastatin calcium], and Cephalexin   Social History   Tobacco Use   Smoking status: Former    Packs/day: 1.50    Years: 8.00    Pack years: 12.00    Types: Cigarettes    Quit date: 03/05/1958    Years since quitting: 62.8   Smokeless tobacco: Never  Vaping Use   Vaping Use: Never used  Substance Use Topics   Alcohol use: No    Alcohol/week: 0.0 standard drinks   Drug use: No     Family Hx: The patient's family history includes ALS in his father; Asthma in his daughter; Cancer in his brother and sister.  ROS:   Please see the history of present illness.     All other systems reviewed and are negative.   Labs/Other Tests and Data Reviewed:    Recent Labs: 11/14/2020: ALT 11; BUN 31; Creatinine, Ser 2.01; Hemoglobin 12.3; Platelets 128.0; Potassium 4.6; Sodium 138   Recent Lipid Panel Lab Results  Component Value Date/Time   CHOL 130 05/09/2020 11:34 AM   CHOL 119 05/09/2017 08:26 AM   TRIG 123.0 05/09/2020 11:34 AM   TRIG  205 (HH) 12/17/2005 10:45 AM   HDL 40.80 05/09/2020 11:34 AM   HDL 35 (L) 05/09/2017 08:26 AM   CHOLHDL 3 05/09/2020 11:34 AM   LDLCALC 65 05/09/2020 11:34 AM   LDLCALC 55 05/09/2017 08:26 AM   LDLDIRECT 55.0 12/30/2018 10:00 AM    Wt Readings from Last 3 Encounters:  12/28/20 189 lb (85.7 kg)  11/14/20 190 lb 9.6 oz (86.5 kg)  05/09/20 190 lb 6.4 oz (86.4 kg)  Objective:    Vital Signs:  BP 140/78   Pulse 66   Ht 5\' 9"  (1.753 m)   Wt 189 lb (85.7 kg)   SpO2 94%   BMI 27.91 kg/m   GEN: Well nourished, well developed in no acute distress HEENT: Normal NECK: No JVD; No carotid bruits LYMPHATICS: No lymphadenopathy CARDIAC:RRR, no murmurs, rubs, gallops RESPIRATORY:  Clear to auscultation without rales, wheezing or rhonchi  ABDOMEN: Soft, non-tender, non-distended MUSCULOSKELETAL:  No edema; No deformity  SKIN: Warm and dry NEUROLOGIC:  Alert and oriented x 3 PSYCHIATRIC:  Normal affect    EKG was done in office today and showed NSR with nonspecific ST abnormality ASSESSMENT & PLAN:    1.  ASCAD  - s/p PTCA D1 in 1999 with repeat cath 12/2011 with nonobstructive dz - CP felt secondary to small vessel dz.   -He has not had any anginal symptoms since I saw him last -Continue prescription drug management with aspirin 81 mg daily, Ranexa 500 mg twice daily, Toprol-XL 50 mg daily, Imdur 30 mg daily and statin with as needed refills   2.  HTN  -BP is adequately controlled on exam today. -New prescription drug management with amlodipine 2.5 mg daily, lisinopril 2.5 mg daily, Toprol-XL 50 mg daily with as needed refills  -I have personally reviewed and interpreted outside labs performed by patient's PCP which showed serum creatinine 2.01 and potassium 4.6 on 11/14/2020  3.  CKD stage 3b - this is followed by his PCP and his baseline creatinine is around 1.9 -2.0  4.  Hyperlipidemia  -his LDL goal is < 70.   -I have personally reviewed and interpreted outside labs  performed by patient's PCP which showed LDL 65, HDL 40, triglycerides 123 22 and ALT 11 in September 2022 -Continue prescription drug management with atorvastatin 10 mg daily with as needed refills.    Medication Adjustments/Labs and Tests Ordered: Current medicines are reviewed at length with the patient today.  Concerns regarding medicines are outlined above.  Tests Ordered: Orders Placed This Encounter  Procedures   EKG 12-Lead    Medication Changes: No orders of the defined types were placed in this encounter.   Disposition:  Follow up in 1 year(s)  Signed, Fransico Him, MD  12/28/2020 9:39 AM    [facility]

## 2020-12-28 NOTE — Addendum Note (Signed)
Addended by: Antonieta Iba on: 12/28/2020 10:08 AM   Modules accepted: Orders

## 2020-12-28 NOTE — Patient Instructions (Signed)

## 2021-01-02 ENCOUNTER — Telehealth (INDEPENDENT_AMBULATORY_CARE_PROVIDER_SITE_OTHER): Payer: PPO | Admitting: Family Medicine

## 2021-01-02 ENCOUNTER — Encounter: Payer: Self-pay | Admitting: Family Medicine

## 2021-01-02 ENCOUNTER — Ambulatory Visit: Payer: PPO

## 2021-01-02 ENCOUNTER — Telehealth: Payer: Self-pay

## 2021-01-02 VITALS — Ht 69.02 in | Wt 179.0 lb

## 2021-01-02 DIAGNOSIS — E1121 Type 2 diabetes mellitus with diabetic nephropathy: Secondary | ICD-10-CM | POA: Diagnosis not present

## 2021-01-02 DIAGNOSIS — J4541 Moderate persistent asthma with (acute) exacerbation: Secondary | ICD-10-CM | POA: Diagnosis not present

## 2021-01-02 DIAGNOSIS — R059 Cough, unspecified: Secondary | ICD-10-CM | POA: Diagnosis not present

## 2021-01-02 MED ORDER — PREDNISONE 20 MG PO TABS: ORAL_TABLET | ORAL | 0 refills | Status: DC

## 2021-01-02 NOTE — Telephone Encounter (Signed)
Patient Name: Roy [name] Gender: Male DOB: April 21, 1932 Age: 87 Y 1 M 19 D Return Phone Number: [phone] (Primary), [phone] (Secondary) Address: City/ State/ Zip: [place]  [address] Client [facility] Client Site [facility] [place] Night Physician [name]- MD Contact Type Call Who Is Calling Patient / Member / Family / Caregiver Call Type Triage / Clinical Caller Name Peri Maris Relationship To Patient Grandchild Return Phone Number [phone] (Primary) Chief Complaint Cough Reason for Call Symptomatic / Request for [place] is calling for her grand father, fever 101, coughing, very congested, hot and cold chills, did a covid test and it was negative. Translation No Nurse Assessment Nurse: Lissa Hoard, RN, [place] Date/Time Eilene Ghazi Time): 12/30/2020 11:47:59 PM Confirm and document reason for call. If symptomatic, describe symptoms. ---Caller is calling for her grandfather, fever 101, coughing, very congested, hot and cold chills, did a covid test and it was negative. feeling chest tightness, grey mucus coughing up; denies other symptoms Does the patient have any new or worsening symptoms? ---Yes Will a triage be completed? ---Yes Related visit to physician within the last 2 weeks? ---No Does the PT have any chronic conditions? (i.e. diabetes, asthma, this includes High risk factors for pregnancy, etc.) ---Yes List chronic conditions. ---diabetes Is this a behavioral health or substance abuse call? ---No Guidelines Guideline Title Affirmed Question Affirmed Notes Nurse Date/Time ([place] Time) COVID-19 - Diagnosed or Suspected [1] HIGH RISK for severe COVID complications (e.g., weak immune system, age > 57 Lissa Hoard, [place], [place] 12/30/2020 11:51:02 PM PLEASE NOTE: All timestamps contained within this report are represented as [place] Standard  Time. CONFIDENTIALTY NOTICE: This fax transmission is intended only for the addressee. It contains information that is legally privileged, confidential or otherwise protected from use or disclosure. If you are not the intended recipient, you are strictly prohibited from reviewing, disclosing, copying using or disseminating any of this information or taking any action in reliance on or regarding this information. If you have received this fax in error, please notify us immediately by telephone so that we can arrange for its return to [place]. Phone: [phone], Toll-Free: [phone], Fax: [phone] Page: 2 of 2 Call Id: [id number] Guidelines Guideline Title Affirmed Question Affirmed Notes Nurse Date/Time Eilene Ghazi Time) years, obesity with BMI 30 or higher, pregnant, chronic lung disease or other chronic medical condition) AND [2] COVID symptoms (e.g., cough, fever) (Exceptions: Already seen by PCP and no new or worsening symptoms.) Disp. Time Eilene Ghazi Time) Disposition Final User 12/31/2020 12:01:29 AM Call PCP within 24 Hours Yes Lissa Hoard, RN, [place] Caller Disagree/Comply Comply Caller Understands Yes PreDisposition Did not know what to do Care Advice Given Per Guideline COUGH MEDICINES: CALL PCP WITHIN 24 HOURS: * IF OFFICE WILL BE CLOSED: I'll page the on-call provider now. EXCEPTION: from 9 pm to 9 am. Since this isn't urgent, we'll hold the page until morning. GENERAL CARE ADVICE FOR COVID-19 SYMPTOMS: * HOME REMEDY - HONEY: This old home remedy has been shown to help decrease coughing at night. The adult dosage is 2 teaspoons (10 ml) at bedtime. COVID-19 - HOW TO PROTECT OTHERS - WHEN YOU ARE SICK WITH COVID-19: CALL BACK IF: * You become worse CARE ADVICE given per COVID-19 - DIAGNOSED OR SUSPECTED (Adult) guideline. Comments User: Corinna Gab, RN Date/Time Eilene Ghazi Time): 12/30/2020 11:53:20 PM tightness when coughing Referrals GO TO FACILITY UNDECIDED [place]  Urgent [place] [facility]

## 2021-01-02 NOTE — Progress Notes (Signed)
Phone [phone] Virtual visit via phonenote   Subjective:   Chief Complaint  Patient presents with   URI    Pt states he has symptoms of a cough and some chest congestion. Symptoms started on 10/28. Pt took an at home covid test on 10/29 which was negative.   This visit type was conducted due to national recommendations for restrictions regarding the COVID-19 Pandemic (e.g. social distancing).  This format is felt to be most appropriate for this patient at this time balancing risks to patient and risks to population by having him in for in person visit.  All issues noted in this document were discussed and addressed.  No physical exam was performed (except for noted visual exam or audio findings with Telehealth visits).  The patient has consented to conduct a Telehealth visit and understands insurance will be billed.   Our team/I connected with Roy [name] at  3:40 PM EDT by phone (patient did not have equipment for webex) and verified that I am speaking with the correct person using two identifiers.  Location patient: [facility] Location provider: [facility], office Persons participating in the virtual visit:  patient  Time on phone: 11 minutes  Our team/I discussed the limitations of evaluation and management by telemedicine and the availability of in person appointments. In light of current covid-19 pandemic, patient also understands that we are trying to protect them by minimizing in office contact if at all possible.  The patient expressed consent for telemedicine visit and agreed to proceed. Patient understands insurance will be billed.   Past Medical History-  Patient Active Problem List   Diagnosis Date Noted   Type II diabetes mellitus with renal manifestations ([place]) 09/11/2006    Priority: High   Coronary artery disease with angina pectoris ([place]) 09/11/2006    Priority: High   Thrombocytopenia ([place]) 02/23/2015    Priority: Medium    Mild persistent asthma 10/30/2010     Priority: Medium    CKD (chronic kidney disease), stage III ([place]) 02/22/2009    Priority: Medium    Hyperlipidemia associated with type 2 diabetes mellitus ([place]) 09/11/2006    Priority: Medium    Hypertension associated with diabetes ([place]) 09/11/2006    Priority: Medium    Back pain 09/11/2006    Priority: Medium    Sleep apnea 09/11/2006    Priority: Medium    Senile purpura ([place]) 08/08/2017    Priority: Low   GERD (gastroesophageal reflux disease) 05/03/2014    Priority: Low   Insomnia 05/03/2014    Priority: Low   Allergic rhinitis 04/21/2014    Priority: Low   Giardia 04/21/2014    Priority: Low   NEPHROLITHIASIS, RECURRENT 01/21/2009    Priority: Low   ACTINIC KERATOSIS, HEAD 06/14/2008    Priority: Low   Right rotator cuff tear 12/11/2018   Calcific tendinitis of right shoulder 09/03/2018   AC (acromioclavicular) arthritis 09/03/2018   Pain of right thumb 04/08/2018   Food impaction of esophagus 04/03/2018    Medications- reviewed and updated Current Outpatient Medications  Medication Sig Dispense Refill   acetaminophen (TYLENOL) 325 MG tablet Take 650 mg by mouth every evening. Reported on 06/28/2015     albuterol (PROVENTIL HFA;VENTOLIN HFA) 108 (90 Base) MCG/ACT inhaler Inhale 2 puffs into the lungs every 4 (four) hours as needed for wheezing or shortness of breath. 1 Inhaler 10   amLODipine (NORVASC) 2.5 MG tablet Take 1 tablet (2.5 mg total) by mouth daily. 90 tablet 3  aspirin EC 81 MG tablet Take 1 tablet (81 mg total) by mouth daily.     ASSURE COMFORT LANCETS 30G MISC 1 each by Other route daily.     atorvastatin (LIPITOR) 10 MG tablet Take 1 tablet (10 mg total) by mouth daily. 90 tablet 3   calcium-vitamin D (OSCAL WITH D) 500-200 MG-UNIT per tablet Take 1 tablet by mouth daily.     glimepiride (AMARYL) 4 MG tablet TAKE 1 & 1/2 (ONE & ONE-HALF) TABLETS BY MOUTH ONCE DAILY 135 tablet 0   glucose blood (ACCU-CHEK AVIVA PLUS) test strip Test blood glucose  daily. 100 each 12   guaiFENesin (MUCINEX) 600 MG 12 hr tablet Take by mouth 2 (two) times daily.     isosorbide mononitrate (IMDUR) 30 MG 24 hr tablet Take 3 tablets (90 mg total) by mouth daily. 270 tablet 3   latanoprost (XALATAN) 0.005 % ophthalmic solution INSTILL 1 DROP INTO EACH EYE NIGHTLY     lisinopril (ZESTRIL) 5 MG tablet Take 1/2 (one-half) tablet by mouth once daily 45 tablet 3   metFORMIN (GLUCOPHAGE) 500 MG tablet TAKE 1/2 (ONE-HALF) TABLET BY MOUTH ONCE DAILY WITH BREAKFAST 45 tablet 0   metoprolol succinate (TOPROL-XL) 50 MG 24 hr tablet TAKE 1 TABLET BY MOUTH ONCE DAILY (TAKE  WITH  OR  IMMEDIATELY  FOLLOWING  A  MEAL) 90 tablet 3   nitroGLYCERIN (NITROSTAT) 0.4 MG SL tablet DISSOLVE ONE TABLET UNDER THE TONGUE EVERY FIVE MINUTES AS NEEDED FOR CHEST PAIN 25 tablet 0   Omega-3 Fatty Acids (FISH OIL PO) Take 2 capsules by mouth 2 (two) times daily.     omeprazole (PRILOSEC) 20 MG capsule Take 1 capsule (20 mg total) by mouth 2 (two) times daily. 180 capsule 3   Polyethyl Glycol-Propyl Glycol (SYSTANE OP) Place 1 drop into both eyes 4 (four) times daily. Itchy/dry eye     potassium citrate (UROCIT-K) 10 MEQ (1080 MG) SR tablet Take 10 mEq by mouth Twice daily.     predniSONE (DELTASONE) 20 MG tablet Take 2 pills for 3 days, 1 pill for 4 days 10 tablet 0   ranolazine (RANEXA) 500 MG 12 hr tablet TAKE 1 TABLET BY MOUTH TWICE DAILY. KEEP UPCOMING APPOINTMENT IN OCTOBER 180 tablet 3   SYMBICORT 160-4.5 MCG/ACT inhaler Inhale 2 puffs by mouth twice daily 11 g 0   traMADol (ULTRAM) 50 MG tablet TAKE 1 TABLET BY MOUTH EVERY 6 HOURS AS NEEDED FOR  MODERATE  PAIN  (MAX  OF  TWICE  DAILY) 60 tablet 0   traZODone (DESYREL) 150 MG tablet Take 1 tablet (150 mg total) by mouth daily. 90 tablet 3   zinc sulfate 220 MG capsule Take 220 mg by mouth daily.     No current facility-administered medications for this visit.     Objective:  Ht 5' 9.02" (1.753 m)   Wt 179 lb (81.2 kg)   BMI 26.42  kg/m  self reported vitals  Nonlabored voice, normal speech      Assessment and Plan   # cough and congestion  S:patient has been mowing several yards but they have been kicking up a lot of dust. Started taking mucinex fast max because he has had cough and chest congestion. Feels some rattling in his chest.  No fever. Symptoms started on Friday. Had seen cardiology last week and was told lungs were clear at that time  Taking symbicort twice a day. Has tried albuterol with minimal relief. Shortness of breath at  rest.  A/P: Patient with increased cough, congestion, chest tightness similar to prior asthma exacerbations plus with potential trigger of allergen exposures while mowing-believe prednisone is the right first step to treat illness for asthma exacerbation.  Asked him to update me if fails to improve in the next 36 hours or so-he was requesting an antibiotic but I want to see if he responds to prednisone first which I think is the right first step -Last A1c within 6 weeks was 7.1-I think he can tolerate prednisone short-term - attempted to call back- has tested for covid once but was going to advise a retest- may do that if touch base with him again on wednseday if not improving  Recommended follow up:  Future Appointments  Date Time Provider [place]  02/10/2021 11:00 AM [facility]  05/16/2021  9:40 AM Yong Channel Brayton Mars, MD [facility]    Lab/Order associations:   ICD-10-CM   1. Cough, unspecified type  R05.9     2. Moderate persistent asthma with acute exacerbation  J45.41     3. Type 2 diabetes mellitus with diabetic nephropathy, without long-term current use of insulin (HCC)  E11.21       Meds ordered this encounter  Medications   predniSONE (DELTASONE) 20 MG tablet    Sig: Take 2 pills for 3 days, 1 pill for 4 days    Dispense:  10 tablet    Refill:  0   I,Jada Bradford,acting as a scribe for [name], MD.,have documented all  relevant documentation on the behalf of [name], MD,as directed by  [name], MD while in the presence of [name], MD.   I, [name], MD, have reviewed all documentation for this visit. The documentation on 01/02/21 for the exam, diagnosis, procedures, and orders are all accurate and complete.   Return precautions advised.  [name], MD

## 2021-01-02 NOTE — Telephone Encounter (Signed)
Pt is scheduled with Dr Yong Channel this afternoon.

## 2021-01-05 ENCOUNTER — Telehealth: Payer: Self-pay

## 2021-01-05 MED ORDER — DOXYCYCLINE HYCLATE 100 MG PO TABS: 100.0000 mg | ORAL_TABLET | Freq: Two times a day (BID) | ORAL | 0 refills | Status: AC

## 2021-01-05 NOTE — Telephone Encounter (Signed)
Sent in doxycycline to cover for bacterial superinfection.  Advised if not improving by next week or symptoms worsen to reach out to us-likely would consider x-ray at that point

## 2021-01-05 NOTE — Telephone Encounter (Signed)
Patient was treated via my chart visit with Dr. Yong Channel on 10/31.  Spouse states was to call back if not getting any better.  States patient is still about the same.  States has a horrible cough, coughing up a lot of mucus that is dark and has tightness in chest from congestion.  Would like to know what the next steps need to be.

## 2021-01-09 ENCOUNTER — Other Ambulatory Visit: Payer: Self-pay | Admitting: Family Medicine

## 2021-01-09 NOTE — Telephone Encounter (Signed)
See below message from pharmacy.

## 2021-01-10 ENCOUNTER — Other Ambulatory Visit: Payer: Self-pay | Admitting: Family Medicine

## 2021-01-10 MED ORDER — ALBUTEROL SULFATE HFA 108 (90 BASE) MCG/ACT IN AERS: 2.0000 | INHALATION_SPRAY | Freq: Four times a day (QID) | RESPIRATORY_TRACT | 2 refills | Status: DC | PRN

## 2021-01-11 ENCOUNTER — Other Ambulatory Visit: Payer: Self-pay

## 2021-01-11 ENCOUNTER — Other Ambulatory Visit: Payer: Self-pay | Admitting: Family Medicine

## 2021-01-11 MED ORDER — ALBUTEROL SULFATE HFA 108 (90 BASE) MCG/ACT IN AERS: 2.0000 | INHALATION_SPRAY | Freq: Four times a day (QID) | RESPIRATORY_TRACT | 3 refills | Status: DC | PRN

## 2021-02-10 ENCOUNTER — Other Ambulatory Visit: Payer: Self-pay

## 2021-02-11 ENCOUNTER — Other Ambulatory Visit: Payer: Self-pay | Admitting: Family Medicine

## 2021-02-14 DIAGNOSIS — M65351 Trigger finger, right little finger: Secondary | ICD-10-CM | POA: Diagnosis not present

## 2021-02-14 DIAGNOSIS — M25541 Pain in joints of right hand: Secondary | ICD-10-CM | POA: Diagnosis not present

## 2021-02-14 DIAGNOSIS — M65331 Trigger finger, right middle finger: Secondary | ICD-10-CM | POA: Diagnosis not present

## 2021-02-15 DIAGNOSIS — H401131 Primary open-angle glaucoma, bilateral, mild stage: Secondary | ICD-10-CM | POA: Diagnosis not present

## 2021-03-06 ENCOUNTER — Other Ambulatory Visit: Payer: Self-pay | Admitting: Family Medicine

## 2021-03-28 ENCOUNTER — Other Ambulatory Visit: Payer: Self-pay | Admitting: Family Medicine

## 2021-03-28 DIAGNOSIS — N183 Chronic kidney disease, stage 3 unspecified: Secondary | ICD-10-CM

## 2021-04-24 ENCOUNTER — Other Ambulatory Visit: Payer: Self-pay

## 2021-04-24 ENCOUNTER — Ambulatory Visit (HOSPITAL_COMMUNITY)
Admission: EM | Admit: 2021-04-24 | Discharge: 2021-04-24 | Disposition: A | Discharge status: Home / Self Care | Payer: PPO | Attending: Physician Assistant | Admitting: Physician Assistant

## 2021-04-24 DIAGNOSIS — S41112A Laceration without foreign body of left upper arm, initial encounter: Secondary | ICD-10-CM | POA: Diagnosis not present

## 2021-04-24 MED ORDER — SILVER NITRATE-POT NITRATE 75-25 % EX MISC
CUTANEOUS | Status: AC
  Filled 2021-04-24: qty 10

## 2021-04-24 MED ORDER — LIDOCAINE-EPINEPHRINE-TETRACAINE (LET) TOPICAL GEL: 3.0000 mL | Freq: Once | TOPICAL | Status: DC

## 2021-04-24 MED ORDER — LIDOCAINE HCL 2 % IJ SOLN
INTRAMUSCULAR | Status: AC
  Filled 2021-04-24: qty 20

## 2021-04-24 NOTE — ED Triage Notes (Signed)
Pt presents with a laceration to the L arm.  States it happened this morning, he cut himself with the gate closer.   States he applied blue medicine at home.

## 2021-04-24 NOTE — Discharge Instructions (Signed)
Keep pressure dressing on until tomorrow.  If this starts bleeding again when you take it off please return here or if severe go to the emergency room.  Assuming this heals appropriately you will need to return in 7 to 10 days for suture removal.  Keep this area clean and if you develop any signs of infection including swelling, redness, fever, increased pain you need to return immediately for reevaluation.

## 2021-04-24 NOTE — ED Provider Notes (Signed)
[place]    CSN: [id number] Arrival date & time: 04/24/21  1500      History   Chief Complaint Chief Complaint  Patient presents with   Laceration    HPI Roy [name] is a 88 y.o. male.   Patient presents today with a several hour history of laceration to his left arm.  Reports that he cut it on the gate and noticed that it had been bleeding prompting evaluation.  He does take aspirin but does not take additional blood thinning medication.  He believes that his status is up-to-date; review of EMR shows last tetanus February 2019.  He denies any difficulty moving his hand.  He denies any significant pain.  He does have a history of recurrent skin tears generally resolve without intervention.   Past Medical History:  Diagnosis Date   Anginal pain ([place])    Arthritis    spine   Asthma    CAD (coronary artery disease)    s/p PCI of the D1 with cath in 2013 showing nonobstructive disease.  CP presumed due to small vessel disease.   CAP (community acquired pneumonia) 12/26/2011   Chronic kidney disease    renal insufficiency   COPD (chronic obstructive pulmonary disease) (HCC)    Diabetes mellitus, type 2 ([place])    Fever 12/26/2011   GERD (gastroesophageal reflux disease)    Heart murmur    Hyperlipidemia    Hypertension    Lyme disease    states had shakes that were thought to be parkinson's related but related to tick bite, states also RMSF   Myocardial infarction [facility])    Right rotator cuff tear    Shortness of breath    Sleep apnea    intolerant to PAP    Patient Active Problem List   Diagnosis Date Noted   Right rotator cuff tear 12/11/2018   Calcific tendinitis of right shoulder 09/03/2018   AC (acromioclavicular) arthritis 09/03/2018   Pain of right thumb 04/08/2018   Food impaction of esophagus 04/03/2018   Senile purpura ([place]) 08/08/2017   Thrombocytopenia ([place]) 02/23/2015   GERD (gastroesophageal reflux disease) 05/03/2014   Insomnia  05/03/2014   Allergic rhinitis 04/21/2014   Giardia 04/21/2014   Mild persistent asthma 10/30/2010   CKD (chronic kidney disease), stage III ([place]) 02/22/2009   NEPHROLITHIASIS, RECURRENT 01/21/2009   ACTINIC KERATOSIS, HEAD 06/14/2008   Type II diabetes mellitus with renal manifestations ([place]) 09/11/2006   Hyperlipidemia associated with type 2 diabetes mellitus ([place]) 09/11/2006   Hypertension associated with diabetes ([place]) 09/11/2006   Coronary artery disease with angina pectoris ([place]) 09/11/2006   Back pain 09/11/2006   Sleep apnea 09/11/2006    Past Surgical History:  Procedure Laterality Date   APPENDECTOMY     CARDIAC CATHETERIZATION  2013   CERVICAL LAMINECTOMY     CHOLECYSTECTOMY     ESOPHAGOGASTRODUODENOSCOPY N/A 04/04/2018   Procedure: ESOPHAGOGASTRODUODENOSCOPY (EGD);  Surgeon: Lin Landsman, MD;  Location: [facility] ENDOSCOPY;  Service: Gastroenterology;  Laterality: N/A;   KIDNEY STONE SURGERY     LEFT HEART CATH N/A 02/15/2012   Procedure: LEFT HEART CATH;  Surgeon: Larey Dresser, MD;  Location: [facility] CATH LAB;  Service: Cardiovascular;  Laterality: N/A;   SHOULDER ARTHROSCOPY WITH ROTATOR CUFF REPAIR AND SUBACROMIAL DECOMPRESSION Right 01/20/2019   Procedure: SHOULDER ARTHROSCOPY WITH ROTATOR CUFF REPAIR AND SUBACROMIAL DECOMPRESSION, BICEPS TENOTOMY;  Surgeon: [name], MD;  Location: [facility];  Service: Orthopedics;  Laterality: Right;  Home Medications    Prior to Admission medications   Medication Sig Start Date End Date Taking? Authorizing Provider  traMADol (ULTRAM) 50 MG tablet TAKE 1 TABLET BY MOUTH EVERY 6 HOURS AS NEEDED FOR  MODERATE  PAIN  (MAX  OF  TWICE  DAILY) 02/13/21   Marin Olp, MD  acetaminophen (TYLENOL) 325 MG tablet Take 650 mg by mouth every evening. Reported on 06/28/2015    [provider]  albuterol (VENTOLIN HFA) 108 (90 Base) MCG/ACT inhaler Inhale 2 puffs into the lungs every 6 (six) hours as  needed for wheezing or shortness of breath. 01/10/21   Marin Olp, MD  albuterol (VENTOLIN HFA) 108 (90 Base) MCG/ACT inhaler Inhale 2 puffs into the lungs every 6 (six) hours as needed for wheezing or shortness of breath. 01/11/21   Marin Olp, MD  amLODipine (NORVASC) 2.5 MG tablet Take 1 tablet (2.5 mg total) by mouth daily. 12/28/20   Sueanne Margarita, MD  aspirin EC 81 MG tablet Take 1 tablet (81 mg total) by mouth daily. 03/11/12   Larey Dresser, MD  ASSURE COMFORT LANCETS 30G MISC 1 each by Other route daily. 12/02/13   [provider]  atorvastatin (LIPITOR) 10 MG tablet Take 1 tablet (10 mg total) by mouth daily. 12/28/20   Sueanne Margarita, MD  calcium-vitamin D (OSCAL WITH D) 500-200 MG-UNIT per tablet Take 1 tablet by mouth daily.    [provider]  glimepiride (AMARYL) 4 MG tablet TAKE 1 & 1/2 (ONE & ONE-HALF) TABLETS BY MOUTH ONCE DAILY 03/29/21   Marin Olp, MD  glucose blood (ACCU-CHEK AVIVA PLUS) test strip Test blood glucose daily. 10/29/19   Marin Olp, MD  guaiFENesin (MUCINEX) 600 MG 12 hr tablet Take by mouth 2 (two) times daily.    [provider]  isosorbide mononitrate (IMDUR) 30 MG 24 hr tablet Take 3 tablets (90 mg total) by mouth daily. 12/28/20   Sueanne Margarita, MD  latanoprost (XALATAN) 0.005 % ophthalmic solution INSTILL 1 DROP INTO EACH EYE NIGHTLY 12/22/19   [provider]  lisinopril (ZESTRIL) 5 MG tablet Take 1/2 (one-half) tablet by mouth once daily 12/28/20   Sueanne Margarita, MD  metFORMIN (GLUCOPHAGE) 500 MG tablet TAKE 1/2 (ONE-HALF) TABLET BY MOUTH ONCE DAILY WITH BREAKFAST 12/14/20   Marin Olp, MD  metoprolol succinate (TOPROL-XL) 50 MG 24 hr tablet TAKE 1 TABLET BY MOUTH ONCE DAILY (TAKE  WITH  OR  IMMEDIATELY  FOLLOWING  A  MEAL) 12/28/20   Sueanne Margarita, MD  nitroGLYCERIN (NITROSTAT) 0.4 MG SL tablet DISSOLVE ONE TABLET UNDER THE TONGUE EVERY FIVE MINUTES AS NEEDED FOR CHEST PAIN 03/23/20    Marin Olp, MD  Omega-3 Fatty Acids (FISH OIL PO) Take 2 capsules by mouth 2 (two) times daily.    [provider]  omeprazole (PRILOSEC) 20 MG capsule Take 1 capsule (20 mg total) by mouth 2 (two) times daily. 11/14/20   Marin Olp, MD  Polyethyl Glycol-Propyl Glycol (SYSTANE OP) Place 1 drop into both eyes 4 (four) times daily. Itchy/dry eye    [provider]  potassium citrate (UROCIT-K) 10 MEQ (1080 MG) SR tablet Take 10 mEq by mouth Twice daily. 01/29/12   [provider]  predniSONE (DELTASONE) 20 MG tablet Take 2 pills for 3 days, 1 pill for 4 days 01/02/21   Marin Olp, MD  [facility] HFA 108 [phone] Base) MCG/ACT inhaler INHALE 2  PUFFS BY MOUTH EVERY 4 HOURS AS NEEDED FOR WHEEZING OR  SHORTNESS  OF  BREATH 01/09/21   Marin Olp, MD  ranolazine (RANEXA) 500 MG 12 hr tablet TAKE 1 TABLET BY MOUTH TWICE DAILY. KEEP UPCOMING APPOINTMENT IN OCTOBER 12/28/20   Sueanne Margarita, MD  SYMBICORT 160-4.5 MCG/ACT inhaler Inhale 2 puffs by mouth twice daily 03/07/21   Marin Olp, MD  traZODone (DESYREL) 150 MG tablet Take 1 tablet (150 mg total) by mouth daily. 11/14/20   Marin Olp, MD  zinc sulfate 220 MG capsule Take 220 mg by mouth daily.    [provider]    Family History Family History  Problem Relation Age of Onset   ALS Father    Asthma Daughter    Cancer Sister        breast/liver   Cancer Brother        bone cancer    Social History Social History   Tobacco Use   Smoking status: Former    Packs/day: 1.50    Years: 8.00    Pack years: 12.00    Types: Cigarettes    Quit date: 03/05/1958    Years since quitting: 63.1   Smokeless tobacco: Never  Vaping Use   Vaping Use: Never used  Substance Use Topics   Alcohol use: No    Alcohol/week: 0.0 standard drinks   Drug use: No     Allergies   Codeine sulfate, Crestor [rosuvastatin calcium], and Cephalexin   Review of Systems Review of Systems   Constitutional:  Positive for activity change. Negative for appetite change, fatigue and fever.  Respiratory:  Negative for cough and shortness of breath.   Cardiovascular:  Negative for chest pain.  Gastrointestinal:  Negative for abdominal pain, diarrhea, nausea and vomiting.  Skin:  Positive for wound.    Physical Exam Triage Vital Signs ED Triage Vitals  Enc Vitals Group     BP 04/24/21 1515 (!) 169/80     Pulse Rate 04/24/21 1515 61     Resp 04/24/21 1515 15     Temp 04/24/21 1515 98.1 F (36.7 C)     Temp Source 04/24/21 1515 Oral     SpO2 04/24/21 1515 100 %     Weight --      Height --      Head Circumference --      Peak Flow --      Pain Score 04/24/21 1514 0     Pain Loc --      Pain Edu? --      Excl. in [place]? --    No data found.  Updated Vital Signs BP (!) 169/80 (BP Location: Right Arm)    Pulse 61    Temp 98.1 F (36.7 C) (Oral)    Resp 15    SpO2 100%   Visual Acuity Right Eye Distance:   Left Eye Distance:   Bilateral Distance:    Right Eye Near:   Left Eye Near:    Bilateral Near:     Physical Exam Vitals reviewed.  Constitutional:      General: He is awake.     Appearance: Normal appearance. He is well-developed. He is not ill-appearing.     Comments: Very pleasant male appears stated age in no acute distress sitting comfortably in exam room  HENT:     Head: Normocephalic and atraumatic.  Cardiovascular:     Rate and Rhythm: Normal rate and regular rhythm.  Heart sounds: Normal heart sounds, S1 normal and S2 normal. No murmur heard. Pulmonary:     Effort: Pulmonary effort is normal.     Breath sounds: Normal breath sounds. No stridor. No wheezing, rhonchi or rales.  Skin:    Findings: Wound present.     Comments: Skin avulsion noted left anterior arm.  Neurological:     Mental Status: He is alert.  Psychiatric:        Behavior: Behavior is cooperative.     UC Treatments / Results  Labs (all labs ordered are listed, but only  abnormal results are displayed) Labs Reviewed - No data to display  EKG   Radiology No results found.  Procedures Laceration Repair  Date/Time: 04/24/2021 5:36 PM Performed by: Chase Picket, MD Authorized by: Terrilee Croak, PA-C   Consent:    Consent obtained:  Verbal   Consent given by:  Patient   Risks discussed:  Infection, pain, poor cosmetic result, poor wound healing and need for additional repair   Alternatives discussed:  Referral Universal protocol:    Procedure explained and questions answered to patient or proxy's satisfaction: yes     Patient identity confirmed:  Verbally with patient Anesthesia:    Anesthesia method:  Local infiltration   Local anesthetic:  Lidocaine 1% WITH epi Laceration details:    Location:  Shoulder/arm   Shoulder/arm location:  L lower arm   Length (cm):  7   Depth (mm):  5 Pre-procedure details:    Preparation:  Patient was prepped and draped in usual sterile fashion Exploration:    Hemostasis achieved with:  Epinephrine and direct pressure   Contaminated: no   Treatment:    Area cleansed with:  Chlorhexidine   Amount of cleaning:  Standard   Irrigation solution:  Sterile saline   Irrigation volume:  45mL   Irrigation method:  Pressure wash   Visualized foreign bodies/material removed: no     Debridement:  None   Undermining:  None Skin repair:    Repair method:  Sutures   Suture size:  3-0   Suture material:  Prolene   Suture technique:  Simple interrupted   Number of sutures:  5 Approximation:    Approximation:  Loose Repair type:    Repair type:  Intermediate Post-procedure details:    Dressing:  Non-adherent dressing   Procedure completion:  Tolerated well, no immediate complications (including critical care time)  Medications Ordered in UC Medications - No data to display  Initial Impression / Assessment and Plan / UC Course  I have reviewed the triage vital signs and the nursing notes.  Pertinent labs &  imaging results that were available during my care of the patient were reviewed by me and considered in my medical decision making (see chart for details).     Initially attempted hemostasis with direct pressure and silver nitrate.  Ultimately, this was unsuccessful as patient continued to have bleeding.  Dr. Lanny Cramp (supervising physician) was consulted who placed 5 sutures and applied pressure dressing with adequate hemostasis.  Patient was instructed to return if he has recurrent bleeding episodes for reevaluation.  Assuming adequate healing he will return in 7 to 10 days for suture removal.  Discussed signs/symptoms of infection that warrant emergent evaluation.  Strict return precautions given to which patient expressed understanding.  Final Clinical Impressions(s) / UC Diagnoses   Final diagnoses:  Skin tear of left upper arm without complication, initial encounter     Discharge Instructions  Keep pressure dressing on until tomorrow.  If this starts bleeding again when you take it off please return here or if severe go to the emergency room.  Assuming this heals appropriately you will need to return in 7 to 10 days for suture removal.  Keep this area clean and if you develop any signs of infection including swelling, redness, fever, increased pain you need to return immediately for reevaluation.     ED Prescriptions   None    PDMP not reviewed this encounter.   Terrilee Croak, PA-C 04/24/21 1739

## 2021-05-01 ENCOUNTER — Other Ambulatory Visit: Payer: Self-pay | Admitting: Family Medicine

## 2021-05-03 ENCOUNTER — Encounter (HOSPITAL_COMMUNITY): Payer: Self-pay | Admitting: *Deleted

## 2021-05-03 ENCOUNTER — Other Ambulatory Visit: Payer: Self-pay

## 2021-05-03 ENCOUNTER — Ambulatory Visit (HOSPITAL_COMMUNITY)
Admission: EM | Admit: 2021-05-03 | Discharge: 2021-05-03 | Disposition: A | Discharge status: Home / Self Care | Payer: PPO | Attending: Internal Medicine | Admitting: Internal Medicine

## 2021-05-03 DIAGNOSIS — Z5189 Encounter for other specified aftercare: Secondary | ICD-10-CM

## 2021-05-03 DIAGNOSIS — Z4802 Encounter for removal of sutures: Secondary | ICD-10-CM

## 2021-05-03 MED ORDER — DOXYCYCLINE HYCLATE 100 MG PO CAPS
100.0000 mg | ORAL_CAPSULE | Freq: Two times a day (BID) | ORAL | 0 refills | Status: AC
Start: 2021-05-03 — End: 2021-05-10

## 2021-05-03 NOTE — Discharge Instructions (Signed)
Please continue to apply bacitracin ointment dailty ?Loose dressing over the wound ?Stop occlusive dressing if no more discharge is seen ?Return to urgent care if you notice worsening redness, discharge or pain. ?

## 2021-05-03 NOTE — ED Triage Notes (Signed)
Pt presents today for suture removal on Lt posterior forearm. ?

## 2021-05-16 ENCOUNTER — Encounter: Payer: PPO | Admitting: Family Medicine

## 2021-05-22 DIAGNOSIS — Z7984 Long term (current) use of oral hypoglycemic drugs: Secondary | ICD-10-CM | POA: Diagnosis not present

## 2021-05-22 DIAGNOSIS — H524 Presbyopia: Secondary | ICD-10-CM | POA: Diagnosis not present

## 2021-05-22 DIAGNOSIS — H52203 Unspecified astigmatism, bilateral: Secondary | ICD-10-CM | POA: Diagnosis not present

## 2021-05-22 DIAGNOSIS — H401131 Primary open-angle glaucoma, bilateral, mild stage: Secondary | ICD-10-CM | POA: Diagnosis not present

## 2021-05-22 DIAGNOSIS — E119 Type 2 diabetes mellitus without complications: Secondary | ICD-10-CM | POA: Diagnosis not present

## 2021-05-22 DIAGNOSIS — Z961 Presence of intraocular lens: Secondary | ICD-10-CM | POA: Diagnosis not present

## 2021-05-22 DIAGNOSIS — H353131 Nonexudative age-related macular degeneration, bilateral, early dry stage: Secondary | ICD-10-CM | POA: Diagnosis not present

## 2021-05-22 LAB — HM DIABETES EYE EXAM

## 2021-06-23 ENCOUNTER — Other Ambulatory Visit: Payer: Self-pay | Admitting: Family Medicine

## 2021-06-27 ENCOUNTER — Ambulatory Visit (INDEPENDENT_AMBULATORY_CARE_PROVIDER_SITE_OTHER): Payer: PPO

## 2021-06-27 DIAGNOSIS — Z0000 Encounter for general adult medical examination without abnormal findings: Secondary | ICD-10-CM | POA: Diagnosis not present

## 2021-06-27 NOTE — Patient Instructions (Signed)
Mr. Sproule , ?Thank you for taking time to come for your Medicare Wellness Visit. I appreciate your ongoing commitment to your health goals. Please review the following plan we discussed and let me know if I can assist you in the future.  ? ?Screening recommendations/referrals: ?Colonoscopy: no longer required  ?Recommended yearly ophthalmology/optometry visit for glaucoma screening and checkup ?Recommended yearly dental visit for hygiene and checkup ? ?Vaccinations: ?Influenza vaccine: Done 11/02/20 repeat every year ?Pneumococcal vaccine: Up to date ?Tdap vaccine: Done 04/09/17 repeat every 10 years  ?Shingles vaccine: Completed 7/28, 12/22/18   ?Covid-19: Completed 1/10, 2/21, 02/02/20 & 08/15/20 ? ?Advanced directives: Advance directive discussed with you today. Even though you declined this today please call our office should you change your mind and we can give you the proper paperwork for you to fill out. ? ?Conditions/risks identified: keep doing well ? ?Next appointment: Follow up in one year for your annual wellness visit.  ? ?Preventive Care 26 Years and Older, Male ?Preventive care refers to lifestyle choices and visits with your health care provider that can promote health and wellness. ?What does preventive care include? ?A yearly physical exam. This is also called an annual well check. ?Dental exams once or twice a year. ?Routine eye exams. Ask your health care provider how often you should have your eyes checked. ?Personal lifestyle choices, including: ?Daily care of your teeth and gums. ?Regular physical activity. ?Eating a healthy diet. ?Avoiding tobacco and drug use. ?Limiting alcohol use. ?Practicing safe sex. ?Taking low doses of aspirin every day. ?Taking vitamin and mineral supplements as recommended by your health care provider. ?What happens during an annual well check? ?The services and screenings done by your health care provider during your annual well check will depend on your age, overall  health, lifestyle risk factors, and family history of disease. ?Counseling  ?Your health care provider may ask you questions about your: ?Alcohol use. ?Tobacco use. ?Drug use. ?Emotional well-being. ?Home and relationship well-being. ?Sexual activity. ?Eating habits. ?History of falls. ?Memory and ability to understand (cognition). ?Work and work Statistician. ?Screening  ?You may have the following tests or measurements: ?Height, weight, and BMI. ?Blood pressure. ?Lipid and cholesterol levels. These may be checked every 5 years, or more frequently if you are over 38 years old. ?Skin check. ?Lung cancer screening. You may have this screening every year starting at age 65 if you have a 30-pack-year history of smoking and currently smoke or have quit within the past 15 years. ?Fecal occult blood test (FOBT) of the stool. You may have this test every year starting at age 57. ?Flexible sigmoidoscopy or colonoscopy. You may have a sigmoidoscopy every 5 years or a colonoscopy every 10 years starting at age 62. ?Prostate cancer screening. Recommendations will vary depending on your family history and other risks. ?Hepatitis C blood test. ?Hepatitis B blood test. ?Sexually transmitted disease (STD) testing. ?Diabetes screening. This is done by checking your blood sugar (glucose) after you have not eaten for a while (fasting). You may have this done every 1-3 years. ?Abdominal aortic aneurysm (AAA) screening. You may need this if you are a current or former smoker. ?Osteoporosis. You may be screened starting at age 43 if you are at high risk. ?Talk with your health care provider about your test results, treatment options, and if necessary, the need for more tests. ?Vaccines  ?Your health care provider may recommend certain vaccines, such as: ?Influenza vaccine. This is recommended every year. ?Tetanus, diphtheria, and acellular  pertussis (Tdap, Td) vaccine. You may need a Td booster every 10 years. ?Zoster vaccine. You may  need this after age 11. ?Pneumococcal 13-valent conjugate (PCV13) vaccine. One dose is recommended after age 5. ?Pneumococcal polysaccharide (PPSV23) vaccine. One dose is recommended after age 78. ?Talk to your health care provider about which screenings and vaccines you need and how often you need them. ?This information is not intended to replace advice given to you by your health care provider. Make sure you discuss any questions you have with your health care provider. ?Document Released: 03/18/2015 Document Revised: 11/09/2015 Document Reviewed: 12/21/2014 ?Elsevier Interactive Patient Education ? 2017 [place]. ? ?Fall Prevention in the Home ?Falls can cause injuries. They can happen to people of all ages. There are many things you can do to make your home safe and to help prevent falls. ?What can I do on the outside of my home? ?Regularly fix the edges of walkways and driveways and fix any cracks. ?Remove anything that might make you trip as you walk through a door, such as a raised step or threshold. ?Trim any bushes or trees on the path to your home. ?Use bright outdoor lighting. ?Clear any walking paths of anything that might make someone trip, such as rocks or tools. ?Regularly check to see if handrails are loose or broken. Make sure that both sides of any steps have handrails. ?Any raised decks and porches should have guardrails on the edges. ?Have any leaves, snow, or ice cleared regularly. ?Use sand or salt on walking paths during winter. ?Clean up any spills in your garage right away. This includes oil or grease spills. ?What can I do in the bathroom? ?Use night lights. ?Install grab bars by the toilet and in the tub and shower. Do not use towel bars as grab bars. ?Use non-skid mats or decals in the tub or shower. ?If you need to sit down in the shower, use a plastic, non-slip stool. ?Keep the floor dry. Clean up any water that spills on the floor as soon as it happens. ?Remove soap buildup in  the tub or shower regularly. ?Attach bath mats securely with double-sided non-slip rug tape. ?Do not have throw rugs and other things on the floor that can make you trip. ?What can I do in the bedroom? ?Use night lights. ?Make sure that you have a light by your bed that is easy to reach. ?Do not use any sheets or blankets that are too big for your bed. They should not hang down onto the floor. ?Have a firm chair that has side arms. You can use this for support while you get dressed. ?Do not have throw rugs and other things on the floor that can make you trip. ?What can I do in the kitchen? ?Clean up any spills right away. ?Avoid walking on wet floors. ?Keep items that you use a lot in easy-to-reach places. ?If you need to reach something above you, use a strong step stool that has a grab bar. ?Keep electrical cords out of the way. ?Do not use floor polish or wax that makes floors slippery. If you must use wax, use non-skid floor wax. ?Do not have throw rugs and other things on the floor that can make you trip. ?What can I do with my stairs? ?Do not leave any items on the stairs. ?Make sure that there are handrails on both sides of the stairs and use them. Fix handrails that are broken or loose. Make sure that handrails  are as long as the stairways. ?Check any carpeting to make sure that it is firmly attached to the stairs. Fix any carpet that is loose or worn. ?Avoid having throw rugs at the top or bottom of the stairs. If you do have throw rugs, attach them to the floor with carpet tape. ?Make sure that you have a light switch at the top of the stairs and the bottom of the stairs. If you do not have them, ask someone to add them for you. ?What else can I do to help prevent falls? ?Wear shoes that: ?Do not have high heels. ?Have rubber bottoms. ?Are comfortable and fit you well. ?Are closed at the toe. Do not wear sandals. ?If you use a stepladder: ?Make sure that it is fully opened. Do not climb a closed  stepladder. ?Make sure that both sides of the stepladder are locked into place. ?Ask someone to hold it for you, if possible. ?Clearly mark and make sure that you can see: ?Any grab bars or handrails. ?First and last

## 2021-06-27 NOTE — Progress Notes (Addendum)
Virtual Visit via Telephone Note ? ?I connected with  Roy [name] on 06/27/21 at 11:15 AM EDT by telephone and verified that I am speaking with the correct person using two identifiers. ? ?Medicare Annual Wellness visit completed telephonically due to Covid-19 pandemic.  ? ?Persons participating in this call: This Health Coach and this patient.  ? ?Location: ?Patient: Home ?Provider: office ?  ?I discussed the limitations, risks, security and privacy concerns of performing an evaluation and management service by telephone and the availability of in person appointments. The patient expressed understanding and agreed to proceed. ? ?Unable to perform video visit due to video visit attempted and failed and/or patient does not have video capability.  ? ?Some vital signs may be absent or patient reported.  ? ?Willette Brace, LPN ? ? ?Subjective:  ? Roy [name] is a 88 y.o. male who presents for Medicare Annual/Subsequent preventive examination. ? ?Review of Systems    ? ?Cardiac Risk Factors include: advanced age (>41mn, >>105women);male gender;dyslipidemia;hypertension ? ?   ?Objective:  ?  ?There were no vitals filed for this visit. ?There is no height or weight on file to calculate BMI. ? ? ?  06/27/2021  ? 10:49 AM 12/28/2019  ?  2:52 PM 01/20/2019  ?  6:34 AM 01/14/2019  ? 11:03 AM 11/27/2018  ? 12:35 PM 04/04/2018  ? 11:30 AM 04/04/2018  ?  1:00 AM  ?Advanced Directives  ?Does Patient Have a Medical Advance Directive? No No No No No  No  ?Would patient like information on creating a medical advance directive? No - Patient declined No - Patient declined No - Patient declined No - Patient declined Yes (MAU/Ambulatory/Procedural Areas - Information given) Yes (Inpatient - patient requests chaplain consult to create a medical advance directive) Yes (Inpatient - patient requests chaplain consult to create a medical advance directive)  ? ? ?Current Medications (verified) ?Outpatient Encounter Medications as of 06/27/2021   ?Medication Sig  ? acetaminophen (TYLENOL) 325 MG tablet Take 650 mg by mouth every evening. Reported on 06/28/2015  ? albuterol (VENTOLIN HFA) 108 (90 Base) MCG/ACT inhaler Inhale 2 puffs into the lungs every 6 (six) hours as needed for wheezing or shortness of breath.  ? amLODipine (NORVASC) 2.5 MG tablet Take 1 tablet (2.5 mg total) by mouth daily.  ? aspirin EC 81 MG tablet Take 1 tablet (81 mg total) by mouth daily.  ? ASSURE COMFORT LANCETS 30G MISC 1 each by Other route daily.  ? atorvastatin (LIPITOR) 10 MG tablet Take 1 tablet (10 mg total) by mouth daily.  ? calcium-vitamin D (OSCAL WITH D) 500-200 MG-UNIT per tablet Take 1 tablet by mouth daily.  ? glimepiride (AMARYL) 4 MG tablet TAKE 1 & 1/2 (ONE & ONE-HALF) TABLETS BY MOUTH ONCE DAILY  ? glucose blood (ACCU-CHEK AVIVA PLUS) test strip Test blood glucose daily.  ? guaiFENesin (MUCINEX) 600 MG 12 hr tablet Take by mouth 2 (two) times daily.  ? isosorbide mononitrate (IMDUR) 30 MG 24 hr tablet Take 3 tablets (90 mg total) by mouth daily.  ? latanoprost (XALATAN) 0.005 % ophthalmic solution INSTILL 1 DROP INTO EACH EYE NIGHTLY  ? lisinopril (ZESTRIL) 5 MG tablet Take 1/2 (one-half) tablet by mouth once daily  ? metFORMIN (GLUCOPHAGE) 500 MG tablet TAKE 1/2 (ONE-HALF) TABLET BY MOUTH ONCE DAILY WITH BREAKFAST  ? metoprolol succinate (TOPROL-XL) 50 MG 24 hr tablet TAKE 1 TABLET BY MOUTH ONCE DAILY (TAKE  WITH  OR  IMMEDIATELY  FOLLOWING  A  MEAL)  ? nitroGLYCERIN (NITROSTAT) 0.4 MG SL tablet DISSOLVE ONE TABLET UNDER THE TONGUE EVERY FIVE MINUTES AS NEEDED FOR CHEST PAIN  ? Omega-3 Fatty Acids (FISH OIL PO) Take 2 capsules by mouth 2 (two) times daily.  ? omeprazole (PRILOSEC) 20 MG capsule Take 1 capsule (20 mg total) by mouth 2 (two) times daily.  ? Polyethyl Glycol-Propyl Glycol (SYSTANE OP) Place 1 drop into both eyes 4 (four) times daily. Itchy/dry eye  ? potassium citrate (UROCIT-K) 10 MEQ (1080 MG) SR tablet Take 10 mEq by mouth Twice daily.  ? PROAIR  HFA 108 (90 Base) MCG/ACT inhaler INHALE 2 PUFFS BY MOUTH EVERY 4 HOURS AS NEEDED FOR WHEEZING OR  SHORTNESS  OF  BREATH  ? ranolazine (RANEXA) 500 MG 12 hr tablet TAKE 1 TABLET BY MOUTH TWICE DAILY. KEEP UPCOMING APPOINTMENT IN OCTOBER  ? SYMBICORT 160-4.5 MCG/ACT inhaler Inhale 2 puffs by mouth twice daily  ? traMADol (ULTRAM) 50 MG tablet TAKE 1 TABLET BY MOUTH EVERY 6 HOURS AS NEEDED FOR  MODERATE  PAIN  (MAX  OF  TWICE  DAILY)  ? traZODone (DESYREL) 150 MG tablet Take 1 tablet (150 mg total) by mouth daily.  ? zinc sulfate 220 MG capsule Take 220 mg by mouth daily.  ? [DISCONTINUED] albuterol (VENTOLIN HFA) 108 (90 Base) MCG/ACT inhaler Inhale 2 puffs into the lungs every 6 (six) hours as needed for wheezing or shortness of breath.  ? [DISCONTINUED] predniSONE (DELTASONE) 20 MG tablet Take 2 pills for 3 days, 1 pill for 4 days  ? ?No facility-administered encounter medications on file as of 06/27/2021.  ? ? ?Allergies (verified) ?Codeine sulfate, Crestor [rosuvastatin calcium], and Cephalexin  ? ?History: ?Past Medical History:  ?Diagnosis Date  ? Anginal pain ([place])   ? Arthritis   ? spine  ? Asthma   ? CAD (coronary artery disease)   ? s/p PCI of the D1 with cath in 2013 showing nonobstructive disease.  CP presumed due to small vessel disease.  ? CAP (community acquired pneumonia) 12/26/2011  ? Chronic kidney disease   ? renal insufficiency  ? COPD (chronic obstructive pulmonary disease) ([place])   ? Diabetes mellitus, type 2 ([place])   ? Fever 12/26/2011  ? GERD (gastroesophageal reflux disease)   ? Heart murmur   ? Hyperlipidemia   ? Hypertension   ? Lyme disease   ? states had shakes that were thought to be parkinson's related but related to tick bite, states also RMSF  ? Myocardial infarction [facility])   ? Right rotator cuff tear   ? Shortness of breath   ? Sleep apnea   ? intolerant to PAP  ? ?Past Surgical History:  ?Procedure Laterality Date  ? APPENDECTOMY    ? CARDIAC CATHETERIZATION  2013  ? CERVICAL  LAMINECTOMY    ? CHOLECYSTECTOMY    ? ESOPHAGOGASTRODUODENOSCOPY N/A 04/04/2018  ? Procedure: ESOPHAGOGASTRODUODENOSCOPY (EGD);  Surgeon: Lin Landsman, MD;  Location[facility] END[facility]COPY;  Service: Gastroenterology;  Laterality: N/A;  ? KIDNEY STONE SURGERY    ? LEFT HEART CATH N/A 02/15/2012  ? Procedure: LEFT HEART CATH;  Surgeon: Larey Dresser, MD;  Location: [facility] CATH LAB;  Service: Cardiovascular;  Laterality: N/A;  ? SHOULDER ARTHROSCOPY WITH ROTATOR CUFF REPAIR AND SUBACROMIAL DECOMPRESSION Right 01/20/2019  ? Procedure: SHOULDER ARTHROSCOPY WITH ROTATOR CUFF REPAIR AND SUBACROMIAL DECOMPRESSION, BICEPS TENOTOMY;  Surgeon: [name], MD;  Location: [place];  Service: Orthopedics;  Laterality: Right;  ? ?Family  History  ?Problem Relation Age of Onset  ? ALS Father   ? Asthma Daughter   ? Cancer Sister   ?     breast/liver  ? Cancer Brother   ?     bone cancer  ? ?Social History  ? ?Socioeconomic History  ? Marital status: Married  ?  Spouse name: Not on file  ? Number of children: Not on file  ? Years of education: Not on file  ? Highest education level: Not on file  ?Occupational History  ? Not on file  ?Tobacco Use  ? Smoking status: Former  ?  Packs/day: 1.50  ?  Years: 8.00  ?  Pack years: 12.00  ?  Types: Cigarettes  ?  Quit date: 03/05/1958  ?  Years since quitting: 63.3  ? Smokeless tobacco: Never  ?Vaping Use  ? Vaping Use: Never used  ?Substance and Sexual Activity  ? Alcohol use: No  ?  Alcohol/week: 0.0 standard drinks  ? Drug use: No  ? Sexual activity: Not Currently  ?Other Topics Concern  ? Not on file  ?Social History Narrative  ? Married (wife patient of Dr. Yong Channel). 3 children. 4 grandchildren. 3 greatgrandchildren.   ?   ? Retired from [employer]  ?   ? Hobbies: rabbit hunting, time with dogs  ? ?Social Determinants of Health  ? ?Financial Resource Strain: Low Risk   ? Difficulty of Paying Living Expenses: Not hard at all  ?Food Insecurity: No Food  Insecurity  ? Worried About Charity fundraiser in the Last Year: Never true  ? Ran Out of Food in the Last Year: Never true  ?Transportation Needs: No Transportation Needs  ? Lack of Transportation (Medical

## 2021-07-01 ENCOUNTER — Other Ambulatory Visit: Payer: Self-pay | Admitting: Family Medicine

## 2021-07-01 DIAGNOSIS — E1122 Type 2 diabetes mellitus with diabetic chronic kidney disease: Secondary | ICD-10-CM

## 2021-08-12 ENCOUNTER — Other Ambulatory Visit: Payer: Self-pay | Admitting: Family Medicine

## 2021-08-17 DIAGNOSIS — M65331 Trigger finger, right middle finger: Secondary | ICD-10-CM | POA: Diagnosis not present

## 2021-08-17 DIAGNOSIS — M65351 Trigger finger, right little finger: Secondary | ICD-10-CM | POA: Diagnosis not present

## 2021-08-23 DIAGNOSIS — H401131 Primary open-angle glaucoma, bilateral, mild stage: Secondary | ICD-10-CM | POA: Diagnosis not present

## 2021-08-27 ENCOUNTER — Other Ambulatory Visit: Payer: Self-pay | Admitting: Family Medicine

## 2021-09-15 ENCOUNTER — Other Ambulatory Visit: Payer: Self-pay

## 2021-09-15 MED ORDER — OMEPRAZOLE 20 MG PO CPDR: 20.0000 mg | DELAYED_RELEASE_CAPSULE | Freq: Two times a day (BID) | ORAL | 3 refills | Status: DC

## 2021-09-25 ENCOUNTER — Other Ambulatory Visit: Payer: Self-pay | Admitting: Family Medicine

## 2021-09-25 DIAGNOSIS — N183 Chronic kidney disease, stage 3 unspecified: Secondary | ICD-10-CM

## 2021-10-02 ENCOUNTER — Other Ambulatory Visit: Payer: Self-pay

## 2021-10-02 DIAGNOSIS — I10 Essential (primary) hypertension: Secondary | ICD-10-CM

## 2021-10-02 MED ORDER — RANOLAZINE ER 500 MG PO TB12: ORAL_TABLET | ORAL | 0 refills | Status: DC

## 2021-10-19 DIAGNOSIS — R351 Nocturia: Secondary | ICD-10-CM | POA: Diagnosis not present

## 2021-10-19 DIAGNOSIS — Z87442 Personal history of urinary calculi: Secondary | ICD-10-CM | POA: Diagnosis not present

## 2021-10-19 DIAGNOSIS — N401 Enlarged prostate with lower urinary tract symptoms: Secondary | ICD-10-CM | POA: Diagnosis not present

## 2021-10-24 ENCOUNTER — Ambulatory Visit (INDEPENDENT_AMBULATORY_CARE_PROVIDER_SITE_OTHER): Payer: PPO | Admitting: Family Medicine

## 2021-10-24 ENCOUNTER — Encounter: Payer: Self-pay | Admitting: Family Medicine

## 2021-10-24 VITALS — BP 125/68 | HR 59 | Temp 98.7°F | Ht 69.0 in | Wt 186.8 lb

## 2021-10-24 DIAGNOSIS — I25119 Atherosclerotic heart disease of native coronary artery with unspecified angina pectoris: Secondary | ICD-10-CM

## 2021-10-24 DIAGNOSIS — E785 Hyperlipidemia, unspecified: Secondary | ICD-10-CM | POA: Diagnosis not present

## 2021-10-24 DIAGNOSIS — D692 Other nonthrombocytopenic purpura: Secondary | ICD-10-CM | POA: Diagnosis not present

## 2021-10-24 DIAGNOSIS — E1169 Type 2 diabetes mellitus with other specified complication: Secondary | ICD-10-CM | POA: Diagnosis not present

## 2021-10-24 DIAGNOSIS — I10 Essential (primary) hypertension: Secondary | ICD-10-CM | POA: Diagnosis not present

## 2021-10-24 DIAGNOSIS — N1832 Chronic kidney disease, stage 3b: Secondary | ICD-10-CM | POA: Diagnosis not present

## 2021-10-24 DIAGNOSIS — E1121 Type 2 diabetes mellitus with diabetic nephropathy: Secondary | ICD-10-CM

## 2021-10-24 DIAGNOSIS — Z0000 Encounter for general adult medical examination without abnormal findings: Secondary | ICD-10-CM | POA: Diagnosis not present

## 2021-10-24 LAB — CBC WITH DIFFERENTIAL/PLATELET
Basophils Absolute: 0.1 10*3/uL (ref 0.0–0.1)
Basophils Relative: 0.8 % (ref 0.0–3.0)
Eosinophils Absolute: 0.9 10*3/uL — ABNORMAL HIGH (ref 0.0–0.7)
Eosinophils Relative: 11.9 % — ABNORMAL HIGH (ref 0.0–5.0)
HCT: 36.5 % — ABNORMAL LOW (ref 39.0–52.0)
Hemoglobin: 12.3 g/dL — ABNORMAL LOW (ref 13.0–17.0)
Lymphocytes Relative: 19.5 % (ref 12.0–46.0)
Lymphs Abs: 1.6 10*3/uL (ref 0.7–4.0)
MCHC: 33.7 g/dL (ref 30.0–36.0)
MCV: 87.4 fl (ref 78.0–100.0)
Monocytes Absolute: 0.6 10*3/uL (ref 0.1–1.0)
Monocytes Relative: 7.5 % (ref 3.0–12.0)
Neutro Abs: 4.8 10*3/uL (ref 1.4–7.7)
Neutrophils Relative %: 60.3 % (ref 43.0–77.0)
Platelets: 145 10*3/uL — ABNORMAL LOW (ref 150.0–400.0)
RBC: 4.17 Mil/uL — ABNORMAL LOW (ref 4.22–5.81)
RDW: 14.8 % (ref 11.5–15.5)
WBC: 8 10*3/uL (ref 4.0–10.5)

## 2021-10-24 LAB — LIPID PANEL
Cholesterol: 139 mg/dL (ref 0–200)
HDL: 43.6 mg/dL (ref >39.00)
LDL Cholesterol: 72 mg/dL (ref 0–99)
NonHDL: 95.4
Total CHOL/HDL Ratio: 3
Triglycerides: 117 mg/dL (ref 0.0–149.0)
VLDL: 23.4 mg/dL (ref 0.0–40.0)

## 2021-10-24 LAB — COMPREHENSIVE METABOLIC PANEL
ALT: 11 U/L (ref 0–53)
AST: 12 U/L (ref 0–37)
Albumin: 4.4 g/dL (ref 3.5–5.2)
Alkaline Phosphatase: 68 U/L (ref 39–117)
BUN: 34 mg/dL — ABNORMAL HIGH (ref 6–23)
CO2: 25 mEq/L (ref 19–32)
Calcium: 9 mg/dL (ref 8.4–10.5)
Chloride: 106 mEq/L (ref 96–112)
Creatinine, Ser: 1.97 mg/dL — ABNORMAL HIGH (ref 0.40–1.50)
GFR: 30.11 mL/min — ABNORMAL LOW (ref >60.00)
Glucose, Bld: 116 mg/dL — ABNORMAL HIGH (ref 70–99)
Potassium: 4.3 mEq/L (ref 3.5–5.1)
Sodium: 141 mEq/L (ref 135–145)
Total Bilirubin: 0.6 mg/dL (ref 0.2–1.2)
Total Protein: 6.7 g/dL (ref 6.0–8.3)

## 2021-10-24 LAB — HEMOGLOBIN A1C: Hgb A1c MFr Bld: 7.2 % — ABNORMAL HIGH (ref 4.6–6.5)

## 2021-10-24 MED ORDER — EMPAGLIFLOZIN 10 MG PO TABS: 10.0000 mg | ORAL_TABLET | Freq: Every day | ORAL | 11 refills | Status: DC

## 2021-10-24 MED ORDER — TRAMADOL HCL 50 MG PO TABS: ORAL_TABLET | ORAL | 2 refills | Status: DC

## 2021-10-24 NOTE — Progress Notes (Signed)
Phone: [phone]   Subjective:  Patient presents today for their annual physical. Chief complaint-noted.   See problem oriented charting- ROS- full  review of systems was completed and negative  except for: sneezing, back pain, neck pain, bruises easily  The following were reviewed and entered/updated in epic: Past Medical History:  Diagnosis Date   Anginal pain ([place])    Arthritis    spine   Asthma    CAD (coronary artery disease)    s/p PCI of the D1 with cath in 2013 showing nonobstructive disease.  CP presumed due to small vessel disease.   CAP (community acquired pneumonia) 12/26/2011   Chronic kidney disease    renal insufficiency   COPD (chronic obstructive pulmonary disease) (HCC)    Diabetes mellitus, type 2 (HCC)    Fever 12/26/2011   GERD (gastroesophageal reflux disease)    Heart murmur    Hyperlipidemia    Hypertension    Lyme disease    states had shakes that were thought to be parkinson's related but related to tick bite, states also RMSF   Myocardial infarction ([place])    Right rotator cuff tear    Shortness of breath    Sleep apnea    intolerant to PAP   Patient Active Problem List   Diagnosis Date Noted   Type II diabetes mellitus with renal manifestations ([place]) 09/11/2006    Priority: High   Coronary artery disease with angina pectoris ([place]) 09/11/2006    Priority: High   Thrombocytopenia ([place]) 02/23/2015    Priority: Medium    Mild persistent asthma 10/30/2010    Priority: Medium    CKD (chronic kidney disease), stage III ([place]) 02/22/2009    Priority: Medium    Hyperlipidemia associated with type 2 diabetes mellitus ([place]) 09/11/2006    Priority: Medium    Essential hypertension 09/11/2006    Priority: Medium    Back pain 09/11/2006    Priority: Medium    Sleep apnea 09/11/2006    Priority: Medium    Food impaction of esophagus 04/03/2018    Priority: Low   Senile purpura ([place]) 08/08/2017    Priority: Low   GERD (gastroesophageal reflux  disease) 05/03/2014    Priority: Low   Insomnia 05/03/2014    Priority: Low   Allergic rhinitis 04/21/2014    Priority: Low   Giardia 04/21/2014    Priority: Low   NEPHROLITHIASIS, RECURRENT 01/21/2009    Priority: Low   ACTINIC KERATOSIS, HEAD 06/14/2008    Priority: Low   Right rotator cuff tear 12/11/2018    Priority: 1.   Calcific tendinitis of right shoulder 09/03/2018    Priority: 1.   AC (acromioclavicular) arthritis 09/03/2018    Priority: 1.   Pain of right thumb 04/08/2018    Priority: 1.   Past Surgical History:  Procedure Laterality Date   APPENDECTOMY     CARDIAC CATHETERIZATION  2013   CERVICAL LAMINECTOMY     CHOLECYSTECTOMY     ESOPHAGOGASTRODUODENOSCOPY N/A 04/04/2018   Procedure: ESOPHAGOGASTRODUODENOSCOPY (EGD);  Surgeon: Lin Landsman, MD;  Location: [facility] ENDOSCOPY;  Service: Gastroenterology;  Laterality: N/A;   KIDNEY STONE SURGERY     LEFT HEART CATH N/A 02/15/2012   Procedure: LEFT HEART CATH;  Surgeon: Larey Dresser, MD;  Location: [facility] CATH LAB;  Service: Cardiovascular;  Laterality: N/A;   SHOULDER ARTHROSCOPY WITH ROTATOR CUFF REPAIR AND SUBACROMIAL DECOMPRESSION Right 01/20/2019   Procedure: SHOULDER ARTHROSCOPY WITH ROTATOR CUFF REPAIR AND SUBACROMIAL DECOMPRESSION, BICEPS TENOTOMY;  Surgeon: [name], MD;  Location: [place];  Service: Orthopedics;  Laterality: Right;    Family History  Problem Relation Age of Onset   ALS Father    Asthma Daughter    Cancer Sister        breast/liver   Cancer Brother        bone cancer    Medications- reviewed and updated Current Outpatient Medications  Medication Sig Dispense Refill   acetaminophen (TYLENOL) 325 MG tablet Take 650 mg by mouth every evening. Reported on 06/28/2015     albuterol (VENTOLIN HFA) 108 (90 Base) MCG/ACT inhaler Inhale 2 puffs into the lungs every 6 (six) hours as needed for wheezing or shortness of breath. 1 each 2   amLODipine (NORVASC) 2.5 MG  tablet Take 1 tablet (2.5 mg total) by mouth daily. 90 tablet 3   aspirin EC 81 MG tablet Take 1 tablet (81 mg total) by mouth daily.     ASSURE COMFORT LANCETS 30G MISC 1 each by Other route daily.     atorvastatin (LIPITOR) 10 MG tablet Take 1 tablet (10 mg total) by mouth daily. 90 tablet 3   calcium-vitamin D (OSCAL WITH D) 500-200 MG-UNIT per tablet Take 1 tablet by mouth daily.     empagliflozin (JARDIANCE) 10 MG TABS tablet Take 1 tablet (10 mg total) by mouth daily before breakfast. 30 tablet 11   glucose blood (ACCU-CHEK AVIVA PLUS) test strip Test blood glucose daily. 100 each 12   isosorbide mononitrate (IMDUR) 30 MG 24 hr tablet Take 3 tablets (90 mg total) by mouth daily. 270 tablet 3   latanoprost (XALATAN) 0.005 % ophthalmic solution INSTILL 1 DROP INTO EACH EYE NIGHTLY     lisinopril (ZESTRIL) 5 MG tablet Take 1/2 (one-half) tablet by mouth once daily 45 tablet 3   metFORMIN (GLUCOPHAGE) 500 MG tablet TAKE 1/2 (ONE-HALF) TABLET BY MOUTH ONCE DAILY WITH BREAKFAST 45 tablet 0   metoprolol succinate (TOPROL-XL) 50 MG 24 hr tablet TAKE 1 TABLET BY MOUTH ONCE DAILY (TAKE  WITH  OR  IMMEDIATELY  FOLLOWING  A  MEAL) 90 tablet 3   nitroGLYCERIN (NITROSTAT) 0.4 MG SL tablet DISSOLVE ONE TABLET UNDER THE TONGUE EVERY FIVE MINUTES AS NEEDED FOR CHEST PAIN 25 tablet 0   Omega-3 Fatty Acids (FISH OIL PO) Take 2 capsules by mouth 2 (two) times daily.     omeprazole (PRILOSEC) 20 MG capsule Take 1 capsule (20 mg total) by mouth 2 (two) times daily. 180 capsule 3   Polyethyl Glycol-Propyl Glycol (SYSTANE OP) Place 1 drop into both eyes 4 (four) times daily. Itchy/dry eye     potassium citrate (UROCIT-K) 10 MEQ (1080 MG) SR tablet Take 10 mEq by mouth Twice daily.     PROAIR HFA 108 (90 Base) MCG/ACT inhaler INHALE 2 PUFFS BY MOUTH EVERY 4 HOURS AS NEEDED FOR WHEEZING OR  SHORTNESS  OF  BREATH 9 g 0   ranolazine (RANEXA) 500 MG 12 hr tablet TAKE 1 TABLET BY MOUTH TWICE DAILY. KEEP UPCOMING  APPOINTMENT IN OCTOBER 180 tablet 0   SYMBICORT 160-4.5 MCG/ACT inhaler Inhale 2 puffs by mouth twice daily 11 g 0   traZODone (DESYREL) 150 MG tablet Take 1 tablet (150 mg total) by mouth daily. 90 tablet 3   zinc sulfate 220 MG capsule Take 220 mg by mouth daily.     guaiFENesin (MUCINEX) 600 MG 12 hr tablet Take by mouth 2 (two) times daily. (Patient not taking: Reported on  10/24/2021)     traMADol (ULTRAM) 50 MG tablet Take 1 tablet twice a day as needed 60 tablet 2   No current facility-administered medications for this visit.    Allergies-reviewed and updated Allergies  Allergen Reactions   Codeine Sulfate Other (See Comments)    Chest pain   Crestor [Rosuvastatin Calcium]    Cephalexin Rash    Social History   Social History Narrative   Married (wife patient of Dr. Yong Channel). 3 children. 4 grandchildren. 3 greatgrandchildren.       Retired from [employer]: rabbit hunting, time with dogs   Objective  Objective:  BP 125/68 Comment: most recent home BP reading  Pulse (!) 59   Temp 98.7 F (37.1 C)   Ht '5\' 9"'$  (1.753 m)   Wt 186 lb 12.8 oz (84.7 kg)   SpO2 98%   BMI 27.59 kg/m  Gen: NAD, resting comfortably HEENT: Mucous membranes are moist. Oropharynx normal Neck: no thyromegaly CV: RRR no murmurs rubs or gallops Lungs: CTAB no crackles, wheeze, rhonchi Abdomen: soft/nontender/nondistended/normal bowel sounds. No rebound or guarding.  Ext: trace edema Skin: warm, dry Neuro: grossly normal, moves all extremities, PERRLA   Diabetic Foot Exam - Simple   Simple Foot Form Diabetic Foot exam was performed with the following findings: Yes 10/24/2021  1:35 PM  Visual Inspection No deformities, no ulcerations, no other skin breakdown bilaterally: Yes Sensation Testing Intact to touch and monofilament testing bilaterally: Yes Pulse Check Posterior Tibialis and Dorsalis pulse intact bilaterally: Yes Comments       Assessment and Plan   88 y.o. male presenting for annual physical.  Health Maintenance counseling: 1. Anticipatory guidance: Patient counseled regarding regular dental exams - advised q6 months, eye exams -yearly,  avoiding smoking and second hand smoke , limiting alcohol to 2 beverages per day -doesn't drink, no illicit drugs .   2. Risk factor reduction:  Advised patient of need for regular exercise and diet rich and fruits and vegetables to reduce risk of heart attack and stroke.  Exercise[place].  Diet/weight management-weight down 4 pounds in last year.  Wt Readings from Last 3 Encounters:  10/24/21 186 lb 12.8 oz (84.7 kg)  01/02/21 179 lb (81.2 kg)  12/28/20 189 lb (85.7 kg)  3. Immunizations/screenings/ancillary studies-discussed flu and COVID shots in the fall - plans to do flu shot at [facility] in later date Immunization History  Administered Date(s) Administered   Influenza Split 12/04/2010, 01/23/2012   Influenza Whole 12/26/2006, 11/28/2007, 11/24/2008, 12/30/2009   Influenza, High Dose Seasonal PF 01/13/2013, 12/10/2013, 11/21/2017, 11/19/2018, 11/02/2020   Influenza-Unspecified 10/31/2015, 11/21/2016, 12/25/2019   Moderna Sars-Covid-2 Vaccination 03/15/2019, 04/26/2019, 02/02/2020, 08/15/2020   PNEUMOCOCCAL CONJUGATE-20 11/02/2020   Pneumococcal Conjugate-13 07/27/2014   Pneumococcal Polysaccharide-23 06/27/2005   Td 06/27/2005, 04/09/2017   Zoster Recombinat (Shingrix) 09/30/2018, 12/22/2018   Zoster, Live 10/31/2015  4. Prostate cancer screening- there is significant change in urinary symptoms-hold off on checking PSA at his age as past age based screening recommendations Lab Results  Component Value Date   PSA 0.78 02/14/2009   PSA 0.80 08/13/2008   PSA 0.76 03/15/2008   5. Colon cancer screening - past age based screening recommendations 6. Skin cancer screening-last year was planning on scheduling and scheduled for September. advised regular sunscreen use. Denies worrisome,  changing, or new skin lesions.  7. Smoking associated screening (lung cancer screening, AAA screen 65-75, UA)-former smoker- quit long ago-no regular  screenings required- quit 1960s 8. STD screening -only active with wife  Status of chronic or acute concerns   #Anemia and platelet issues in the past-hematology 01/13/2019 said no further work-up and released patient to as needed-overall largely stable Lab Results  Component Value Date   WBC 6.7 11/14/2020   HGB 12.3 (L) 11/14/2020   HCT 37.1 (L) 11/14/2020   MCV 87.5 11/14/2020   PLT 128.0 (L) 11/14/2020   # Diabetes S:compliant with  Metformin '500mg'$  half tablet with breakfast, glimepiride 6 mg -lowest CBG he has had is 77 Lab Results  Component Value Date   HGBA1C 7.1 (H) 11/14/2020   HGBA1C 6.9 (H) 05/09/2020   HGBA1C 6.6 (H) 10/29/2019  A/P: Diabetes is very well controlled but due to heart history we opted to trial Jardiance 10 mg and stop glimepiride 6 mg-he can continue metformin half tablet for now-reasonable to recheck in 4 months and check kidney function to see how he is doing on this -team will abstract DM eye exam -can switch back if too costly  # CAD with angina/Hyperlipidemia S:compliant with atorvastatin '10mg'$  and aspirin '81mg'$ . LDL goal <70 . Asymptomatic while on ranexa and imdur.  Lab Results  Component Value Date   CHOL 130 05/09/2020   HDL 40.80 05/09/2020   LDLCALC 65 05/09/2020   LDLDIRECT 55.0 12/30/2018   TRIG 123.0 05/09/2020   CHOLHDL 3 05/09/2020  A/P: CAD with angina but well controlled on Ranexa and Imdur-continue current medications as well as aspirin and statin  For hyperlipidemia LDL has been well controlled-update full lipid panel with labs today  # Hypertension/CKD III S:compliant with  Amlodipine 2.5, imdur '90mg'$ , lisinopril 2.5 mg, metoprolol '50mg'$  XR -on lisinopril in case proteinuric element. GFR usually 30-40 range A/P: blood pressure- Controlled. Continue current medications.    CKD  III- hopefully stable- update cmp today.   # Asthma S: compliant with symbicort. uses albuterol - hardly ever uses A/P:Controlled. Continue current medications.    #history of kidney stones- remains on potassium citrate through urology   #chronic pain- tramadol takes a mild edge off of pain- willing to refill for neck and back pain  #insomnia- trazodone helping for sleep   #senile purpura- noted. Stable. Check cbc at least annually. Tore his skin on right arm when trying ot pull ear of corn off then other hand hit hard for example  Recommended follow up: Return in about 4 months (around 02/23/2022) for followup or sooner if needed.Schedule b4 you leave. Future Appointments  Date Time Provider [place]  07/12/2022 11:00 AM [facility]   Lab/Order associations: fasting   ICD-10-CM   1. Preventative health care  Z00.00     2. Senile purpura (HCC) Chronic D69.2     3. Coronary artery disease involving native coronary artery of native heart with angina pectoris (HCC) Chronic I25.119     4. Type 2 diabetes mellitus with diabetic nephropathy, without long-term current use of insulin (HCC)  E11.21 CBC with Differential/Platelet    Comprehensive metabolic panel    Lipid panel    Hemoglobin A1c    5. Hyperlipidemia associated with type 2 diabetes mellitus (HCC)  E11.69    E78.5     6. Essential hypertension  I10     7. Stage 3b chronic kidney disease (HCC)  N18.32       Meds ordered this encounter  Medications   empagliflozin (JARDIANCE) 10 MG TABS tablet    Sig: Take 1 tablet (10  mg total) by mouth daily before breakfast.    Dispense:  30 tablet    Refill:  11   traMADol (ULTRAM) 50 MG tablet    Sig: Take 1 tablet twice a day as needed    Dispense:  60 tablet    Refill:  2   Return precautions advised.  [name], MD

## 2021-10-24 NOTE — Patient Instructions (Addendum)
Diabetes is very well controlled but due to heart history we opted to trial Jardiance 10 mg and stop glimepiride 6 mg-he can continue metformin half tablet for now-reasonable to recheck in 4 months and check kidney function to see how he is doing on this  Flu shot- we should have these available within a month or two but please let us know if you get at outside pharmacy - new covid shot releasing in October  Team please abstract  Diabetic Eye Exam that I printed  Please stop by lab before you go If you have mychart- we will send your results within 3 business days of [place] receiving them.  If you do not have mychart- we will call you about results within 5 business days of [place] receiving them.  *please also note that you will see labs on mychart as soon as they post. I will later go in and write notes on them- will say "notes from Dr. Yong Channel"    Recommended follow up: Return in about 4 months (around 02/23/2022) for followup or sooner if needed.Schedule b4 you leave.

## 2021-10-25 ENCOUNTER — Other Ambulatory Visit: Payer: Self-pay

## 2021-10-25 MED ORDER — ATORVASTATIN CALCIUM 20 MG PO TABS: 20.0000 mg | ORAL_TABLET | Freq: Every day | ORAL | 3 refills | Status: DC

## 2021-10-28 ENCOUNTER — Other Ambulatory Visit: Payer: Self-pay | Admitting: Cardiology

## 2021-10-31 ENCOUNTER — Other Ambulatory Visit: Payer: Self-pay | Admitting: Family Medicine

## 2021-11-09 ENCOUNTER — Other Ambulatory Visit: Payer: Self-pay | Admitting: Family Medicine

## 2021-11-10 ENCOUNTER — Telehealth: Payer: Self-pay | Admitting: Family Medicine

## 2021-11-10 NOTE — Telephone Encounter (Signed)
FYI  Caller states: -She was informed by pharmacy that she should let PCP know that patient his COVID booster and flu vaccine on 11/10/21.

## 2021-11-13 NOTE — Telephone Encounter (Signed)
Vaccines documented.  

## 2021-11-18 ENCOUNTER — Other Ambulatory Visit: Payer: Self-pay | Admitting: Family Medicine

## 2021-11-23 DIAGNOSIS — L570 Actinic keratosis: Secondary | ICD-10-CM | POA: Diagnosis not present

## 2021-11-23 DIAGNOSIS — L814 Other melanin hyperpigmentation: Secondary | ICD-10-CM | POA: Diagnosis not present

## 2021-11-23 DIAGNOSIS — L821 Other seborrheic keratosis: Secondary | ICD-10-CM | POA: Diagnosis not present

## 2021-11-23 DIAGNOSIS — D1801 Hemangioma of skin and subcutaneous tissue: Secondary | ICD-10-CM | POA: Diagnosis not present

## 2021-11-23 DIAGNOSIS — C44219 Basal cell carcinoma of skin of left ear and external auricular canal: Secondary | ICD-10-CM | POA: Diagnosis not present

## 2021-11-23 DIAGNOSIS — D692 Other nonthrombocytopenic purpura: Secondary | ICD-10-CM | POA: Diagnosis not present

## 2021-11-28 DIAGNOSIS — H401131 Primary open-angle glaucoma, bilateral, mild stage: Secondary | ICD-10-CM | POA: Diagnosis not present

## 2021-12-23 ENCOUNTER — Other Ambulatory Visit: Payer: Self-pay | Admitting: Cardiology

## 2021-12-27 ENCOUNTER — Other Ambulatory Visit: Payer: Self-pay | Admitting: Family Medicine

## 2021-12-27 DIAGNOSIS — E1122 Type 2 diabetes mellitus with diabetic chronic kidney disease: Secondary | ICD-10-CM

## 2021-12-31 ENCOUNTER — Other Ambulatory Visit: Payer: Self-pay | Admitting: Family Medicine

## 2022-01-02 DIAGNOSIS — Z85828 Personal history of other malignant neoplasm of skin: Secondary | ICD-10-CM | POA: Diagnosis not present

## 2022-01-02 DIAGNOSIS — C44219 Basal cell carcinoma of skin of left ear and external auricular canal: Secondary | ICD-10-CM | POA: Diagnosis not present

## 2022-01-06 ENCOUNTER — Other Ambulatory Visit: Payer: Self-pay | Admitting: Family Medicine

## 2022-01-11 ENCOUNTER — Ambulatory Visit: Payer: PPO | Attending: Cardiology | Admitting: Cardiology

## 2022-01-11 ENCOUNTER — Encounter: Payer: Self-pay | Admitting: Cardiology

## 2022-01-11 VITALS — BP 138/72 | HR 63 | Ht 69.0 in | Wt 180.8 lb

## 2022-01-11 DIAGNOSIS — I25119 Atherosclerotic heart disease of native coronary artery with unspecified angina pectoris: Secondary | ICD-10-CM

## 2022-01-11 DIAGNOSIS — I152 Hypertension secondary to endocrine disorders: Secondary | ICD-10-CM | POA: Diagnosis not present

## 2022-01-11 DIAGNOSIS — E1169 Type 2 diabetes mellitus with other specified complication: Secondary | ICD-10-CM

## 2022-01-11 DIAGNOSIS — E1159 Type 2 diabetes mellitus with other circulatory complications: Secondary | ICD-10-CM

## 2022-01-11 DIAGNOSIS — N1832 Chronic kidney disease, stage 3b: Secondary | ICD-10-CM

## 2022-01-11 DIAGNOSIS — E785 Hyperlipidemia, unspecified: Secondary | ICD-10-CM

## 2022-01-11 NOTE — Progress Notes (Signed)
Date:  01/11/2022   ID:  Roy [name], DOB [birth year], MRN [id number]  PCP:  Marin Olp, MD  Cardiologist: Fransico Him, MD Electrophysiologist:  None   Chief Complaint:  CAD, HTN, HLD  History of Present Illness:    Roy [name] is a 88 y.o. male with a hx of CAD s/p PTCA D1 in 1999 with repeat cath 12/2011 with nonobstructive dz (CP felt secondary to small vessel dz), CKD, diabetes, HTN, OSA intolerant to CPAP and hyperlipidemia.    He is here today for followup and is doing well.  He denies any chest pain or pressure, SOB, DOE, PND, orthopnea, LE edema, dizziness, palpitations or syncope. He is compliant with his meds and is tolerating meds with no SE.    Prior CV studies:   The following studies were reviewed today:  EKG  Past Medical History:  Diagnosis Date   Anginal pain ([place])    Arthritis    spine   Asthma    CAD (coronary artery disease)    s/p PCI of the D1 with cath in 2013 showing nonobstructive disease.  CP presumed due to small vessel disease.   CAP (community acquired pneumonia) 12/26/2011   Chronic kidney disease    renal insufficiency   COPD (chronic obstructive pulmonary disease) (HCC)    Diabetes mellitus, type 2 ([place])    Fever 12/26/2011   GERD (gastroesophageal reflux disease)    Heart murmur    Hyperlipidemia    Hypertension    Lyme disease    states had shakes that were thought to be parkinson's related but related to tick bite, states also RMSF   Myocardial infarction [facility])    Right rotator cuff tear    Shortness of breath    Sleep apnea    intolerant to PAP   Past Surgical History:  Procedure Laterality Date   APPENDECTOMY     CARDIAC CATHETERIZATION  2013   CERVICAL LAMINECTOMY     CHOLECYSTECTOMY     ESOPHAGOGASTRODUODENOSCOPY N/A 04/04/2018   Procedure: ESOPHAGOGASTRODUODENOSCOPY (EGD);  Surgeon: Lin Landsman, MD;  Location: [facility] ENDOSCOPY;  Service: Gastroenterology;  Laterality: N/A;   KIDNEY STONE SURGERY     LEFT  HEART CATH N/A 02/15/2012   Procedure: LEFT HEART CATH;  Surgeon: Larey Dresser, MD;  Location: [facility] CATH LAB;  Service: Cardiovascular;  Laterality: N/A;   SHOULDER ARTHROSCOPY WITH ROTATOR CUFF REPAIR AND SUBACROMIAL DECOMPRESSION Right 01/20/2019   Procedure: SHOULDER ARTHROSCOPY WITH ROTATOR CUFF REPAIR AND SUBACROMIAL DECOMPRESSION, BICEPS TENOTOMY;  Surgeon: [name], MD;  Location: [place];  Service: Orthopedics;  Laterality: Right;     Current Meds  Medication Sig   acetaminophen (TYLENOL) 325 MG tablet Take 650 mg by mouth every evening. Reported on 06/28/2015   albuterol (VENTOLIN HFA) 108 (90 Base) MCG/ACT inhaler Inhale 2 puffs into the lungs every 6 (six) hours as needed for wheezing or shortness of breath.   amLODipine (NORVASC) 2.5 MG tablet Take 1 tablet (2.5 mg total) by mouth daily.   aspirin EC 81 MG tablet Take 1 tablet (81 mg total) by mouth daily.   ASSURE COMFORT LANCETS 30G MISC 1 each by Other route daily.   atorvastatin (LIPITOR) 20 MG tablet Take 1 tablet (20 mg total) by mouth daily.   calcium-vitamin D (OSCAL WITH D) 500-200 MG-UNIT per tablet Take 1 tablet by mouth daily.   empagliflozin (JARDIANCE) 10 MG TABS tablet Take 1 tablet (10 mg total) by mouth daily  before breakfast.   glucose blood (ACCU-CHEK AVIVA PLUS) test strip Test blood glucose daily.   guaiFENesin (MUCINEX) 600 MG 12 hr tablet Take by mouth 2 (two) times daily.   isosorbide mononitrate (IMDUR) 30 MG 24 hr tablet Take 3 tablets (90 mg total) by mouth daily.   latanoprost (XALATAN) 0.005 % ophthalmic solution INSTILL 1 DROP INTO EACH EYE NIGHTLY   lisinopril (ZESTRIL) 5 MG tablet Take 1/2 (one-half) tablet by mouth once daily   metFORMIN (GLUCOPHAGE) 500 MG tablet TAKE 1/2 (ONE-HALF) TABLET BY MOUTH ONCE DAILY WITH BREAKFAST   metoprolol succinate (TOPROL-XL) 50 MG 24 hr tablet TAKE 1 TABLET BY MOUTH ONCE DAILY WITH  OR  IMMEDIATELY  FOLLOWING  A  MEAL   nitroGLYCERIN  (NITROSTAT) 0.4 MG SL tablet DISSOLVE ONE TABLET UNDER THE TONGUE EVERY FIVE MINUTES AS NEEDED FOR CHEST PAIN   Omega-3 Fatty Acids (FISH OIL PO) Take 2 capsules by mouth 2 (two) times daily.   omeprazole (PRILOSEC) 20 MG capsule Take 1 capsule (20 mg total) by mouth 2 (two) times daily.   Polyethyl Glycol-Propyl Glycol (SYSTANE OP) Place 1 drop into both eyes 4 (four) times daily. Itchy/dry eye   potassium citrate (UROCIT-K) 10 MEQ (1080 MG) SR tablet Take 10 mEq by mouth Twice daily.   PROAIR HFA 108 (90 Base) MCG/ACT inhaler INHALE 2 PUFFS BY MOUTH EVERY 4 HOURS AS NEEDED FOR WHEEZING OR  SHORTNESS  OF  BREATH   ranolazine (RANEXA) 500 MG 12 hr tablet TAKE 1 TABLET BY MOUTH TWICE DAILY. KEEP UPCOMING APPOINTMENT IN OCTOBER   SYMBICORT 160-4.5 MCG/ACT inhaler Inhale 2 puffs by mouth twice daily   traMADol (ULTRAM) 50 MG tablet Take 1 tablet twice a day as needed   traZODone (DESYREL) 150 MG tablet Take 1 tablet by mouth once daily   zinc sulfate 220 MG capsule Take 220 mg by mouth daily.     Allergies:   Codeine sulfate, Crestor [rosuvastatin calcium], and Cephalexin   Social History   Tobacco Use   Smoking status: Former    Packs/day: 1.50    Years: 8.00    Total pack years: 12.00    Types: Cigarettes    Quit date: 03/05/1958    Years since quitting: 63.8   Smokeless tobacco: Never  Vaping Use   Vaping Use: Never used  Substance Use Topics   Alcohol use: No    Alcohol/week: 0.0 standard drinks of alcohol   Drug use: No     Family Hx: The patient's family history includes ALS in his father; Asthma in his daughter; Cancer in his brother and sister.  ROS:   Please see the history of present illness.     All other systems reviewed and are negative.   Labs/Other Tests and Data Reviewed:    Recent Labs: 10/24/2021: ALT 11; BUN 34; Creatinine, Ser 1.97; Hemoglobin 12.3; Platelets 145.0; Potassium 4.3; Sodium 141   Recent Lipid Panel Lab Results  Component Value Date/Time    CHOL 139 10/24/2021 01:59 PM   CHOL 119 05/09/2017 08:26 AM   TRIG 117.0 10/24/2021 01:59 PM   TRIG 205 (HH) 12/17/2005 10:45 AM   HDL 43.60 10/24/2021 01:59 PM   HDL 35 (L) 05/09/2017 08:26 AM   CHOLHDL 3 10/24/2021 01:59 PM   LDLCALC 72 10/24/2021 01:59 PM   LDLCALC 55 05/09/2017 08:26 AM   LDLDIRECT 55.0 12/30/2018 10:00 AM    Wt Readings from Last 3 Encounters:  01/11/22 180 lb 12.8 oz (82 kg)  10/24/21 186 lb 12.8 oz (84.7 kg)  01/02/21 179 lb (81.2 kg)     Objective:    Vital Signs:  BP 138/72   Pulse 63   Ht '5\' 9"'$  (1.753 m)   Wt 180 lb 12.8 oz (82 kg)   SpO2 98%   BMI 26.70 kg/m   GEN: Well nourished, well developed in no acute distress HEENT: Normal NECK: No JVD; No carotid bruits LYMPHATICS: No lymphadenopathy CARDIAC:RRR, no murmurs, rubs, gallops RESPIRATORY:  Clear to auscultation without rales, wheezing or rhonchi  ABDOMEN: Soft, non-tender, non-distended MUSCULOSKELETAL:  No edema; No deformity  SKIN: Warm and dry NEUROLOGIC:  Alert and oriented x 3 PSYCHIATRIC:  Normal affect   EKG was done in office today and showed NSR with nonspecific ST abnormality ASSESSMENT & PLAN:    1.  ASCAD  - s/p PTCA D1 in 1999 with repeat cath 12/2011 with nonobstructive dz - CP felt secondary to small vessel dz.   -He denies any anginal symptoms since I saw him last -Continue prescription drug management with aspirin 81 mg daily, amlodipine 2.5 mg daily, Toprol-XL 50 mg daily, Ranexa '500mg'$  BID and Imdur 90 mg daily with as needed refills  2.  HTN  -BP is adequately controlled on exam today -Continue prescription drug management with amlodipine 2.5 mg daily, lisinopril 2.5 mg daily and Toprol-XL 50 mg daily with as needed refills -I have personally reviewed and interpreted outside labs performed by patient's PCP which showed serum creatinine 2.01 and potassium 4.6 on 11/14/2020  3.  CKD stage 3b - this is followed by his PCP and his baseline creatinine is around 1.9  -2.0 -I have personally reviewed and interpreted outside labs performed by patient's PCP which showed serum creatinine 1.97 11/24/2021  4.  Hyperlipidemia  -his LDL goal is < 70.   -I have personally reviewed and interpreted outside labs performed by patient's PCP which showed LDL 72, HDL 43 and ALT 11 10/24/2021 -His atorvastatin was increased to 20 mg daily after labs in August -Repeat FLP and ALT   Medication Adjustments/Labs and Tests Ordered: Current medicines are reviewed at length with the patient today.  Concerns regarding medicines are outlined above.  Tests Ordered: Orders Placed This Encounter  Procedures   EKG 12-Lead    Medication Changes: No orders of the defined types were placed in this encounter.    Disposition:  Follow up in 1 year(s)  Signed, Fransico Him, MD  01/11/2022 10:15 AM    [place]

## 2022-01-11 NOTE — Patient Instructions (Signed)
Medication Instructions:  Your physician recommends that you continue on your current medications as directed. Please refer to the Current Medication list given to you today.  *If you need a refill on your cardiac medications before your next appointment, please call your pharmacy*  Lab Work: Come back fasting for lipids and ALT If you have labs (blood work) drawn today and your tests are completely normal, you will receive your results only by: [place] (if you have MyChart) OR A paper copy in the mail If you have any lab test that is abnormal or we need to change your treatment, we will call you to review the results.  Follow-Up: At [facility], you and your health needs are our priority.  As part of our continuing mission to provide you with exceptional heart care, we have created designated Provider Care Teams.  These Care Teams include your primary Cardiologist (physician) and Advanced Practice Providers (APPs -  Physician Assistants and Nurse Practitioners) who all work together to provide you with the care you need, when you need it.  Your next appointment:   1 year(s)  The format for your next appointment:   In Person  Provider:   Fransico Him, MD     Important Information About Sugar

## 2022-01-11 NOTE — Addendum Note (Signed)
Addended by: Bernestine Amass on: 01/11/2022 10:20 AM   Modules accepted: Orders

## 2022-01-12 ENCOUNTER — Ambulatory Visit: Payer: PPO | Attending: Cardiology

## 2022-01-12 DIAGNOSIS — N1832 Chronic kidney disease, stage 3b: Secondary | ICD-10-CM

## 2022-01-12 DIAGNOSIS — E1159 Type 2 diabetes mellitus with other circulatory complications: Secondary | ICD-10-CM | POA: Diagnosis not present

## 2022-01-12 DIAGNOSIS — E1169 Type 2 diabetes mellitus with other specified complication: Secondary | ICD-10-CM

## 2022-01-12 DIAGNOSIS — E785 Hyperlipidemia, unspecified: Secondary | ICD-10-CM | POA: Diagnosis not present

## 2022-01-12 DIAGNOSIS — I25119 Atherosclerotic heart disease of native coronary artery with unspecified angina pectoris: Secondary | ICD-10-CM | POA: Diagnosis not present

## 2022-01-12 DIAGNOSIS — I152 Hypertension secondary to endocrine disorders: Secondary | ICD-10-CM | POA: Diagnosis not present

## 2022-01-12 LAB — LIPID PANEL
Chol/HDL Ratio: 3 ratio (ref 0.0–5.0)
Cholesterol, Total: 138 mg/dL (ref 100–199)
HDL: 46 mg/dL (ref >39)
LDL Chol Calc (NIH): 66 mg/dL (ref 0–99)
Triglycerides: 150 mg/dL — ABNORMAL HIGH (ref 0–149)
VLDL Cholesterol Cal: 26 mg/dL (ref 5–40)

## 2022-01-12 LAB — ALT: ALT: 13 IU/L (ref 0–44)

## 2022-02-03 ENCOUNTER — Other Ambulatory Visit: Payer: Self-pay | Admitting: Cardiology

## 2022-02-03 DIAGNOSIS — I10 Essential (primary) hypertension: Secondary | ICD-10-CM

## 2022-02-14 ENCOUNTER — Other Ambulatory Visit: Payer: Self-pay | Admitting: Family Medicine

## 2022-02-19 ENCOUNTER — Encounter: Payer: Self-pay | Admitting: Family Medicine

## 2022-02-19 ENCOUNTER — Ambulatory Visit (INDEPENDENT_AMBULATORY_CARE_PROVIDER_SITE_OTHER): Payer: PPO | Admitting: Family Medicine

## 2022-02-19 DIAGNOSIS — E1121 Type 2 diabetes mellitus with diabetic nephropathy: Secondary | ICD-10-CM

## 2022-02-19 LAB — COMPREHENSIVE METABOLIC PANEL
ALT: 10 U/L (ref 0–53)
AST: 11 U/L (ref 0–37)
Albumin: 4.4 g/dL (ref 3.5–5.2)
Alkaline Phosphatase: 82 U/L (ref 39–117)
BUN: 48 mg/dL — ABNORMAL HIGH (ref 6–23)
CO2: 24 mEq/L (ref 19–32)
Calcium: 9.4 mg/dL (ref 8.4–10.5)
Chloride: 101 mEq/L (ref 96–112)
Creatinine, Ser: 2.39 mg/dL — ABNORMAL HIGH (ref 0.40–1.50)
GFR: 23.83 mL/min — ABNORMAL LOW (ref >60.00)
Glucose, Bld: 244 mg/dL — ABNORMAL HIGH (ref 70–99)
Potassium: 4.4 mEq/L (ref 3.5–5.1)
Sodium: 138 mEq/L (ref 135–145)
Total Bilirubin: 0.7 mg/dL (ref 0.2–1.2)
Total Protein: 6.4 g/dL (ref 6.0–8.3)

## 2022-02-19 LAB — CBC WITH DIFFERENTIAL/PLATELET
Basophils Absolute: 0.1 10*3/uL (ref 0.0–0.1)
Basophils Relative: 0.6 % (ref 0.0–3.0)
Eosinophils Absolute: 1.4 10*3/uL — ABNORMAL HIGH (ref 0.0–0.7)
Eosinophils Relative: 14.1 % — ABNORMAL HIGH (ref 0.0–5.0)
HCT: 40 % (ref 39.0–52.0)
Hemoglobin: 13.6 g/dL (ref 13.0–17.0)
Lymphocytes Relative: 16.3 % (ref 12.0–46.0)
Lymphs Abs: 1.6 10*3/uL (ref 0.7–4.0)
MCHC: 34.1 g/dL (ref 30.0–36.0)
MCV: 86.2 fl (ref 78.0–100.0)
Monocytes Absolute: 0.7 10*3/uL (ref 0.1–1.0)
Monocytes Relative: 7.5 % (ref 3.0–12.0)
Neutro Abs: 6.2 10*3/uL (ref 1.4–7.7)
Neutrophils Relative %: 61.5 % (ref 43.0–77.0)
Platelets: 141 10*3/uL — ABNORMAL LOW (ref 150.0–400.0)
RBC: 4.64 Mil/uL (ref 4.22–5.81)
RDW: 14.7 % (ref 11.5–15.5)
WBC: 10 10*3/uL (ref 4.0–10.5)

## 2022-02-19 LAB — HEMOGLOBIN A1C: Hgb A1c MFr Bld: 9 % — ABNORMAL HIGH (ref 4.6–6.5)

## 2022-02-19 NOTE — Progress Notes (Signed)
Phone [phone] In person visit   Subjective:   Roy [name] is a 88 y.o. year old very pleasant male patient who presents for/with See problem oriented charting Chief Complaint  Patient presents with   Follow-up   Hypertension   Diabetes   Past Medical History-  Patient Active Problem List   Diagnosis Date Noted   Type II diabetes mellitus with renal manifestations ([place]) 09/11/2006    Priority: High   Coronary artery disease with angina pectoris ([place]) 09/11/2006    Priority: High   Thrombocytopenia ([place]) 02/23/2015    Priority: Medium    Mild persistent asthma 10/30/2010    Priority: Medium    CKD (chronic kidney disease), stage III ([place]) 02/22/2009    Priority: Medium    Hyperlipidemia associated with type 2 diabetes mellitus ([place]) 09/11/2006    Priority: Medium    Essential hypertension 09/11/2006    Priority: Medium    Back pain 09/11/2006    Priority: Medium    Sleep apnea 09/11/2006    Priority: Medium    Food impaction of esophagus 04/03/2018    Priority: Low   Senile purpura ([place]) 08/08/2017    Priority: Low   GERD (gastroesophageal reflux disease) 05/03/2014    Priority: Low   Insomnia 05/03/2014    Priority: Low   Allergic rhinitis 04/21/2014    Priority: Low   Giardia 04/21/2014    Priority: Low   NEPHROLITHIASIS, RECURRENT 01/21/2009    Priority: Low   ACTINIC KERATOSIS, HEAD 06/14/2008    Priority: Low   Right rotator cuff tear 12/11/2018    Priority: 1.   Calcific tendinitis of right shoulder 09/03/2018    Priority: 1.   AC (acromioclavicular) arthritis 09/03/2018    Priority: 1.   Pain of right thumb 04/08/2018    Priority: 1.    Medications- reviewed and updated Current Outpatient Medications  Medication Sig Dispense Refill   acetaminophen (TYLENOL) 325 MG tablet Take 650 mg by mouth every evening. Reported on 06/28/2015     albuterol (VENTOLIN HFA) 108 (90 Base) MCG/ACT inhaler Inhale 2 puffs into the lungs every 6 (six) hours as  needed for wheezing or shortness of breath. 1 each 2   amLODipine (NORVASC) 2.5 MG tablet Take 1 tablet (2.5 mg total) by mouth daily. 90 tablet 3   aspirin EC 81 MG tablet Take 1 tablet (81 mg total) by mouth daily.     ASSURE COMFORT LANCETS 30G MISC 1 each by Other route daily.     atorvastatin (LIPITOR) 20 MG tablet Take 1 tablet (20 mg total) by mouth daily. 90 tablet 3   calcium-vitamin D (OSCAL WITH D) 500-200 MG-UNIT per tablet Take 1 tablet by mouth daily.     empagliflozin (JARDIANCE) 10 MG TABS tablet Take 1 tablet (10 mg total) by mouth daily before breakfast. 30 tablet 11   glucose blood (ACCU-CHEK AVIVA PLUS) test strip Test blood glucose daily. 100 each 12   guaiFENesin (MUCINEX) 600 MG 12 hr tablet Take by mouth 2 (two) times daily.     isosorbide mononitrate (IMDUR) 30 MG 24 hr tablet Take 3 tablets (90 mg total) by mouth daily. 270 tablet 3   latanoprost (XALATAN) 0.005 % ophthalmic solution INSTILL 1 DROP INTO EACH EYE NIGHTLY     lisinopril (ZESTRIL) 5 MG tablet Take 1/2 (one-half) tablet by mouth once daily 15 tablet 0   metFORMIN (GLUCOPHAGE) 500 MG tablet TAKE 1/2 (ONE-HALF) TABLET BY MOUTH ONCE DAILY WITH BREAKFAST 45 tablet  0   metoprolol succinate (TOPROL-XL) 50 MG 24 hr tablet TAKE 1 TABLET BY MOUTH ONCE DAILY WITH  OR  IMMEDIATELY  FOLLOWING  A  MEAL 90 tablet 0   nitroGLYCERIN (NITROSTAT) 0.4 MG SL tablet DISSOLVE ONE TABLET UNDER THE TONGUE EVERY FIVE MINUTES AS NEEDED FOR CHEST PAIN 25 tablet 0   Omega-3 Fatty Acids (FISH OIL PO) Take 2 capsules by mouth 2 (two) times daily.     omeprazole (PRILOSEC) 20 MG capsule Take 1 capsule (20 mg total) by mouth 2 (two) times daily. 180 capsule 3   Polyethyl Glycol-Propyl Glycol (SYSTANE OP) Place 1 drop into both eyes 4 (four) times daily. Itchy/dry eye     potassium citrate (UROCIT-K) 10 MEQ (1080 MG) SR tablet Take 10 mEq by mouth Twice daily.     PROAIR HFA 108 (90 Base) MCG/ACT inhaler INHALE 2 PUFFS BY MOUTH EVERY 4 HOURS  AS NEEDED FOR WHEEZING OR  SHORTNESS  OF  BREATH 9 g 0   ranolazine (RANEXA) 500 MG 12 hr tablet TAKE 1 TABLET BY MOUTH TWICE DAILY KEEP UPCOMING APPOINTMENT IN OCTOBER 180 tablet 3   SYMBICORT 160-4.5 MCG/ACT inhaler Inhale 2 puffs by mouth twice daily 11 g 0   traMADol (ULTRAM) 50 MG tablet Take 1 tablet twice a day as needed 60 tablet 2   traZODone (DESYREL) 150 MG tablet Take 1 tablet by mouth once daily 90 tablet 0   zinc sulfate 220 MG capsule Take 220 mg by mouth daily.     No current facility-administered medications for this visit.     Objective:  BP 118/64   Pulse 65   Temp 97.8 F (36.6 C)   Ht '5\' 9"'$  (1.753 m)   Wt 178 lb (80.7 kg)   SpO2 95%   BMI 26.29 kg/m  Gen: NAD, resting comfortably CV: RRR no murmurs rubs or gallops Lungs: CTAB no crackles, wheeze, rhonchi Ext: no edema Skin: warm, dry    Assessment and Plan   #Left 5th hand subcutaneous lesion- wonder about ganglion cyst but rather mobile- may not actually be attached to joint- he has seen [facility] in the past- he agrees to see them if worsens  # Diabetes S:compliant with  Metformin '500mg'$  half tablet with breakfast, jardiance 10 mg -down 8 lbs since starting jardiance!  -no low blood sugars Lab Results  Component Value Date   HGBA1C 7.2 (H) 10/24/2021   HGBA1C 7.1 (H) 11/14/2020   HGBA1C 6.9 (H) 05/09/2020  A/P: hopefully stable or improved- update a1c  today. Continue current meds for now. We will toelrate 7.5 or less but prefer under 7 if now lows   # CAD with angina/Hyperlipidemia S:compliant with atorvastatin '10mg'$  and aspirin '81mg'$ . LDL goal <70 . Asymptomatic while on ranexa '500mg'$  XR BID and imdur 90 mg  - no chest pain or shortness of breath  Lab Results  Component Value Date   CHOL 138 01/12/2022   HDL 46 01/12/2022   LDLCALC 66 01/12/2022   LDLDIRECT 55.0 12/30/2018   TRIG 150 (H) 01/12/2022   CHOLHDL 3.0 01/12/2022  A/P: CAD asymptomatic- continue current medications   Lipids- at  goal- continue current medications    # Hypertension/CKD III S:compliant with  Amlodipine 2.5, imdur '90mg'$ , lisinopril 2.5 mg, metoprolol '50mg'$  XR -on lisinopril in case proteinuric element. GFR usually 30-40 range A/P: HYPERTENSION stable- continue current medicines   CKD III- hopefully stable- update cmp today. Continue current meds for now  # Asthma S:  compliant with symbicort. uses albuterol - not needing A/P:stable- continue current medicines   #chronic pain ror hands (also sees [facility])- tramadol takes a mild edge off of pain- willing to refill   #insomnia- trazodone helping for sleep - doing well  Recommended follow up: Return in about 4 months (around 06/21/2022) for followup or sooner if needed.Schedule b4 you leave. Future Appointments  Date Time Provider [place]  02/19/2022 10:20 AM Marin Olp, MD [facility]  07/12/2022 11:00 AM [facility] HEALTH COACH [facility]   Lab/Order associations:   ICD-10-CM   1. Type 2 diabetes mellitus with diabetic nephropathy, without long-term current use of insulin (HCC)  E11.21 CBC with Differential/Platelet    Comprehensive metabolic panel    Hemoglobin A1c      No orders of the defined types were placed in this encounter.   Return precautions advised.  [name], MD

## 2022-02-19 NOTE — Patient Instructions (Addendum)
Please stop by lab before you go If you have mychart- we will send your results within 3 business days of [place] receiving them.  If you do not have mychart- we will call you about results within 5 business days of [place] receiving them.  *please also note that you will see labs on mychart as soon as they post. I will later go in and write notes on them- will say "notes from Dr. Yong Channel"   If area on your left 5th finger bothers you more- discuss with [facility]- I wonder if this is a cyst attached to your joint- if they do not think orthopedic related- we could have you see dermatology  Recommended follow up: Return in about 4 months (around 06/21/2022) for followup or sooner if needed.Schedule b4 you leave. If a1c looks really good like under 7- may change to 6 months

## 2022-02-20 ENCOUNTER — Other Ambulatory Visit: Payer: Self-pay

## 2022-02-20 DIAGNOSIS — E1121 Type 2 diabetes mellitus with diabetic nephropathy: Secondary | ICD-10-CM

## 2022-02-20 DIAGNOSIS — N1832 Chronic kidney disease, stage 3b: Secondary | ICD-10-CM

## 2022-02-20 DIAGNOSIS — H353131 Nonexudative age-related macular degeneration, bilateral, early dry stage: Secondary | ICD-10-CM | POA: Diagnosis not present

## 2022-02-20 DIAGNOSIS — H1132 Conjunctival hemorrhage, left eye: Secondary | ICD-10-CM | POA: Diagnosis not present

## 2022-02-20 DIAGNOSIS — H401131 Primary open-angle glaucoma, bilateral, mild stage: Secondary | ICD-10-CM | POA: Diagnosis not present

## 2022-02-20 MED ORDER — GLIPIZIDE 2.5 MG PO TABS: 2.5000 mg | ORAL_TABLET | Freq: Every day | ORAL | 5 refills | Status: DC

## 2022-03-06 ENCOUNTER — Other Ambulatory Visit: Payer: PPO

## 2022-03-07 ENCOUNTER — Other Ambulatory Visit: Payer: Self-pay | Admitting: Family Medicine

## 2022-03-08 ENCOUNTER — Other Ambulatory Visit (INDEPENDENT_AMBULATORY_CARE_PROVIDER_SITE_OTHER): Payer: PPO

## 2022-03-08 DIAGNOSIS — N1832 Chronic kidney disease, stage 3b: Secondary | ICD-10-CM

## 2022-03-09 LAB — BASIC METABOLIC PANEL
BUN: 37 mg/dL — ABNORMAL HIGH (ref 6–23)
CO2: 29 mEq/L (ref 19–32)
Calcium: 8.8 mg/dL (ref 8.4–10.5)
Chloride: 102 mEq/L (ref 96–112)
Creatinine, Ser: 2.07 mg/dL — ABNORMAL HIGH (ref 0.40–1.50)
GFR: 28.3 mL/min — ABNORMAL LOW (ref >60.00)
Glucose, Bld: 126 mg/dL — ABNORMAL HIGH (ref 70–99)
Potassium: 4.2 mEq/L (ref 3.5–5.1)
Sodium: 140 mEq/L (ref 135–145)

## 2022-03-10 ENCOUNTER — Other Ambulatory Visit: Payer: Self-pay | Admitting: Cardiology

## 2022-03-12 ENCOUNTER — Telehealth: Payer: Self-pay | Admitting: Family Medicine

## 2022-03-12 NOTE — Telephone Encounter (Signed)
Caller states: - Keba informed patient to call back with blood sugar numbers - Numbers below do not have dates with them; He has been taking medication as advised   Blood sugar numbers: 230,218,196,215, 205, and 237

## 2022-03-13 NOTE — Telephone Encounter (Signed)
See below in response to mychart lab result question.

## 2022-03-13 NOTE — Telephone Encounter (Signed)
Sugars remain far too high. Team can you please resend glipizide as take daily with breakfast- not sure why it came out as 2 pm.   Would he be willing to trial once weekly ozempic to try to bring sugars down? Are options are more limited with his kidney function.

## 2022-03-14 MED ORDER — ACCU-CHEK AVIVA PLUS W/DEVICE KIT: PACK | 3 refills | Status: DC

## 2022-03-14 MED ORDER — GLIPIZIDE 2.5 MG PO TABS: 1.0000 | ORAL_TABLET | Freq: Every day | ORAL | 3 refills | Status: DC

## 2022-03-14 MED ORDER — ACCU-CHEK AVIVA PLUS VI STRP: ORAL_STRIP | 12 refills | Status: DC

## 2022-03-14 NOTE — Addendum Note (Signed)
Addended by: Clyde Lundborg A on: 03/14/2022 10:49 AM   Modules accepted: Orders

## 2022-03-14 NOTE — Telephone Encounter (Signed)
Called and spoke with pt, message given and new meter sent in for pt. Pt does not wish to try Opzempic at this time.

## 2022-04-07 ENCOUNTER — Other Ambulatory Visit: Payer: Self-pay | Admitting: Family Medicine

## 2022-04-13 DIAGNOSIS — M545 Low back pain, unspecified: Secondary | ICD-10-CM | POA: Diagnosis not present

## 2022-04-17 DIAGNOSIS — M47816 Spondylosis without myelopathy or radiculopathy, lumbar region: Secondary | ICD-10-CM | POA: Diagnosis not present

## 2022-04-26 DIAGNOSIS — M47816 Spondylosis without myelopathy or radiculopathy, lumbar region: Secondary | ICD-10-CM | POA: Diagnosis not present

## 2022-04-28 ENCOUNTER — Other Ambulatory Visit: Payer: Self-pay | Admitting: Cardiology

## 2022-05-01 ENCOUNTER — Other Ambulatory Visit: Payer: Self-pay | Admitting: Family Medicine

## 2022-05-02 ENCOUNTER — Other Ambulatory Visit: Payer: Self-pay

## 2022-05-02 MED ORDER — LISINOPRIL 5 MG PO TABS
2.5000 mg | ORAL_TABLET | Freq: Every day | ORAL | 3 refills | Status: DC
Start: 2022-05-02 — End: 2023-04-16

## 2022-05-04 DIAGNOSIS — M5416 Radiculopathy, lumbar region: Secondary | ICD-10-CM | POA: Diagnosis not present

## 2022-05-05 ENCOUNTER — Other Ambulatory Visit: Payer: Self-pay | Admitting: Cardiology

## 2022-05-10 ENCOUNTER — Other Ambulatory Visit: Payer: Self-pay | Admitting: Family Medicine

## 2022-05-17 ENCOUNTER — Other Ambulatory Visit: Payer: Self-pay | Admitting: Family Medicine

## 2022-05-17 NOTE — Telephone Encounter (Signed)
Patient states: - Has been using albuterol inhaler since other inhaler not available  - Wants an update on medication   Please advise.

## 2022-05-17 NOTE — Telephone Encounter (Signed)
Could you look into this please and let us know what alternatives are or initiate PA.

## 2022-05-21 ENCOUNTER — Other Ambulatory Visit (HOSPITAL_COMMUNITY): Payer: Self-pay

## 2022-05-21 DIAGNOSIS — M47816 Spondylosis without myelopathy or radiculopathy, lumbar region: Secondary | ICD-10-CM | POA: Diagnosis not present

## 2022-05-21 NOTE — Telephone Encounter (Signed)
Patient Advocate Encounter   Received notification from [employer] that prior authorization for Budesonide-Formoterol is required.   PA submitted on 05/21/2022 Key [id number] Status is pending

## 2022-05-21 NOTE — Telephone Encounter (Signed)
Pt also needs to call insurance, they have his DOB as [birth year]. I asked them to change it on their end, but was told that the pt has to call to get that information changed.

## 2022-05-22 ENCOUNTER — Telehealth: Payer: Self-pay

## 2022-05-22 DIAGNOSIS — H353191 Nonexudative age-related macular degeneration, unspecified eye, early dry stage: Secondary | ICD-10-CM | POA: Diagnosis not present

## 2022-05-22 DIAGNOSIS — E119 Type 2 diabetes mellitus without complications: Secondary | ICD-10-CM | POA: Diagnosis not present

## 2022-05-22 DIAGNOSIS — H52203 Unspecified astigmatism, bilateral: Secondary | ICD-10-CM | POA: Diagnosis not present

## 2022-05-22 DIAGNOSIS — H5213 Myopia, bilateral: Secondary | ICD-10-CM | POA: Diagnosis not present

## 2022-05-22 DIAGNOSIS — H524 Presbyopia: Secondary | ICD-10-CM | POA: Diagnosis not present

## 2022-05-22 DIAGNOSIS — Z7984 Long term (current) use of oral hypoglycemic drugs: Secondary | ICD-10-CM | POA: Diagnosis not present

## 2022-05-22 DIAGNOSIS — H401131 Primary open-angle glaucoma, bilateral, mild stage: Secondary | ICD-10-CM | POA: Diagnosis not present

## 2022-05-22 DIAGNOSIS — Z961 Presence of intraocular lens: Secondary | ICD-10-CM | POA: Diagnosis not present

## 2022-05-22 NOTE — Telephone Encounter (Signed)
Patient Advocate Encounter  Prior Authorization for Budesonide-formoterol HFA 74mcg-4.5mcg has been approved.    Effective dates: 05/21/22 through 03/05/23

## 2022-06-07 ENCOUNTER — Other Ambulatory Visit: Payer: Self-pay | Admitting: Cardiology

## 2022-06-08 ENCOUNTER — Other Ambulatory Visit: Payer: Self-pay | Admitting: Cardiology

## 2022-06-11 ENCOUNTER — Other Ambulatory Visit: Payer: Self-pay | Admitting: *Deleted

## 2022-06-11 MED ORDER — ISOSORBIDE MONONITRATE ER 30 MG PO TB24: 90.0000 mg | ORAL_TABLET | Freq: Every day | ORAL | 2 refills | Status: DC

## 2022-06-21 NOTE — Telephone Encounter (Signed)
Apologies for the delay, nothing is required from you. What was originally routed was just to let the team know that the PA is pending and pt needs to change DOB with their insurance. PA has been approved since, pt shouldn't have an issue getting this at the pharmacy. Thanks for following up! Sorry for the confusion.

## 2022-06-25 ENCOUNTER — Encounter: Payer: Self-pay | Admitting: Family Medicine

## 2022-06-25 ENCOUNTER — Ambulatory Visit (INDEPENDENT_AMBULATORY_CARE_PROVIDER_SITE_OTHER): Payer: PPO | Admitting: Family Medicine

## 2022-06-25 VITALS — BP 123/70 | HR 58 | Temp 97.9°F | Resp 16 | Ht 69.5 in | Wt 178.6 lb

## 2022-06-25 DIAGNOSIS — Z7984 Long term (current) use of oral hypoglycemic drugs: Secondary | ICD-10-CM

## 2022-06-25 DIAGNOSIS — E1169 Type 2 diabetes mellitus with other specified complication: Secondary | ICD-10-CM

## 2022-06-25 DIAGNOSIS — N1832 Chronic kidney disease, stage 3b: Secondary | ICD-10-CM | POA: Diagnosis not present

## 2022-06-25 DIAGNOSIS — I25119 Atherosclerotic heart disease of native coronary artery with unspecified angina pectoris: Secondary | ICD-10-CM | POA: Diagnosis not present

## 2022-06-25 DIAGNOSIS — I10 Essential (primary) hypertension: Secondary | ICD-10-CM

## 2022-06-25 DIAGNOSIS — E1121 Type 2 diabetes mellitus with diabetic nephropathy: Secondary | ICD-10-CM | POA: Diagnosis not present

## 2022-06-25 DIAGNOSIS — J4530 Mild persistent asthma, uncomplicated: Secondary | ICD-10-CM | POA: Diagnosis not present

## 2022-06-25 DIAGNOSIS — E785 Hyperlipidemia, unspecified: Secondary | ICD-10-CM | POA: Diagnosis not present

## 2022-06-25 LAB — POCT GLYCOSYLATED HEMOGLOBIN (HGB A1C): Hemoglobin A1C: 7 % — AB (ref 4.0–5.6)

## 2022-06-25 MED ORDER — TRAMADOL HCL 50 MG PO TABS: ORAL_TABLET | ORAL | 2 refills | Status: DC

## 2022-06-25 MED ORDER — GLIPIZIDE 2.5 MG PO TABS: 1.0000 | ORAL_TABLET | Freq: Every day | ORAL | 3 refills | Status: DC

## 2022-06-25 NOTE — Progress Notes (Signed)
Phone [phone] In person visit   Subjective:   Roy [name] is a 89 y.o. year old very pleasant male patient who presents for/with See problem oriented charting Chief Complaint  Patient presents with   Diabetes    Recently on antibiotic for procedure which elevated sugar, but it has been coming back down   Hyperlipidemia   Hypertension   Past Medical History-  Patient Active Problem List   Diagnosis Date Noted   Type II diabetes mellitus with renal manifestations 09/11/2006    Priority: High   Coronary artery disease with angina pectoris 09/11/2006    Priority: High   Mild persistent asthma 10/30/2010    Priority: Medium    Stage 3b chronic kidney disease 02/22/2009    Priority: Medium    Hyperlipidemia associated with type 2 diabetes mellitus 09/11/2006    Priority: Medium    Essential hypertension 09/11/2006    Priority: Medium    Back pain 09/11/2006    Priority: Medium    Sleep apnea 09/11/2006    Priority: Medium    Food impaction of esophagus 04/03/2018    Priority: Low   GERD (gastroesophageal reflux disease) 05/03/2014    Priority: Low   Insomnia 05/03/2014    Priority: Low   Allergic rhinitis 04/21/2014    Priority: Low   Giardia 04/21/2014    Priority: Low   NEPHROLITHIASIS, RECURRENT 01/21/2009    Priority: Low   ACTINIC KERATOSIS, HEAD 06/14/2008    Priority: Low   Right rotator cuff tear 12/11/2018    Priority: 1.   Calcific tendinitis of right shoulder 09/03/2018    Priority: 1.   AC (acromioclavicular) arthritis 09/03/2018    Priority: 1.   Pain of right thumb 04/08/2018    Priority: 1.    Medications- reviewed and updated Current Outpatient Medications  Medication Sig Dispense Refill   acetaminophen (TYLENOL) 325 MG tablet Take 650 mg by mouth every evening. Reported on 06/28/2015     albuterol (VENTOLIN HFA) 108 (90 Base) MCG/ACT inhaler Inhale 2 puffs into the lungs every 6 (six) hours as needed for wheezing or shortness of breath. 1  each 2   amLODipine (NORVASC) 2.5 MG tablet Take 1 tablet by mouth once daily 90 tablet 2   aspirin EC 81 MG tablet Take 1 tablet (81 mg total) by mouth daily.     ASSURE COMFORT LANCETS 30G MISC 1 each by Other route daily.     atorvastatin (LIPITOR) 20 MG tablet Take 1 tablet (20 mg total) by mouth daily. 90 tablet 3   Blood Glucose Monitoring Suppl (ACCU-CHEK AVIVA PLUS) w/Device KIT Use to test blood sugars once daily. Dx: E11.9 1 kit 3   budesonide-formoterol (SYMBICORT) 80-4.5 MCG/ACT inhaler Inhale 2 puffs into the lungs 2 (two) times daily. 1 each 3   calcium-vitamin D (OSCAL WITH D) 500-200 MG-UNIT per tablet Take 1 tablet by mouth daily.     glipiZIDE 2.5 MG TABS Take 1 tablet by mouth daily at 2 PM. Take before breakfast instead of 2pm. 90 tablet 3   glucose blood (ACCU-CHEK AVIVA PLUS) test strip Test blood glucose daily. Dx: E11.9 100 each 12   guaiFENesin (MUCINEX) 600 MG 12 hr tablet Take by mouth 2 (two) times daily.     isosorbide mononitrate (IMDUR) 30 MG 24 hr tablet Take 3 tablets (90 mg total) by mouth daily. 270 tablet 2   latanoprost (XALATAN) 0.005 % ophthalmic solution INSTILL 1 DROP INTO EACH EYE NIGHTLY  lisinopril (ZESTRIL) 5 MG tablet Take 0.5 tablets (2.5 mg total) by mouth daily. 45 tablet 3   metoprolol succinate (TOPROL-XL) 50 MG 24 hr tablet TAKE 1 TABLET BY MOUTH ONCE DAILY (TAKE  WITH  OR  IMMEDATELY  FOLLOWING  A  MEAL) 90 tablet 2   nitroGLYCERIN (NITROSTAT) 0.4 MG SL tablet DISSOLVE ONE TABLET UNDER THE TONGUE EVERY FIVE MINUTES AS NEEDED FOR CHEST PAIN 25 tablet 0   Omega-3 Fatty Acids (FISH OIL PO) Take 2 capsules by mouth 2 (two) times daily.     omeprazole (PRILOSEC) 20 MG capsule Take 1 capsule (20 mg total) by mouth 2 (two) times daily. 180 capsule 3   Polyethyl Glycol-Propyl Glycol (SYSTANE OP) Place 1 drop into both eyes 4 (four) times daily. Itchy/dry eye     potassium citrate (UROCIT-K) 10 MEQ (1080 MG) SR tablet Take 10 mEq by mouth Twice daily.      PROAIR HFA 108 (90 Base) MCG/ACT inhaler INHALE 2 PUFFS BY MOUTH EVERY 4 HOURS AS NEEDED FOR WHEEZING OR  SHORTNESS  OF  BREATH 9 g 0   ranolazine (RANEXA) 500 MG 12 hr tablet TAKE 1 TABLET BY MOUTH TWICE DAILY KEEP UPCOMING APPOINTMENT IN OCTOBER 180 tablet 3   traZODone (DESYREL) 150 MG tablet Take 1 tablet by mouth once daily 90 tablet 0   zinc sulfate 220 MG capsule Take 220 mg by mouth daily.     traMADol (ULTRAM) 50 MG tablet Take 1 tablet twice a day as needed 60 tablet 2   No current facility-administered medications for this visit.     Objective:  BP 123/70 Comment: bp today before coming into office  Pulse (!) 58   Temp 97.9 F (36.6 C) (Temporal)   Resp 16   Ht 5' 9.5" (1.765 m)   Wt 178 lb 9.6 oz (81 kg)   SpO2 98%   BMI 26.00 kg/m  Gen: NAD, resting comfortably CV: RRR no murmurs rubs or gallops Lungs: CTAB no crackles, wheeze, rhonchi Ext: no edema Skin: warm, dry     Assessment and Plan   # Diabetes S:compliant with glipizide 2.5 mg daily, jardiance 10 mg (but he is off) -130s to 150's fasting - prior  Metformin  half tablet with breakfast-stop 02/19/2022 due to chronic kidney disease  Lab Results  Component Value Date   HGBA1C 7.0 (A) 06/25/2022   HGBA1C 9.0 (H) 02/19/2022   HGBA1C 7.2 (H) 10/24/2021  A/P: reasonable control -jardiance 10 mg off of- he stopped thinking we wanted him to stop that and metformin but a1c is at goal so continue glipizide alone - also stay off metfomrin   # CAD with angina/Hyperlipidemia- sees Dr. Mayford Knife S:compliant with atorvastatin  and aspirin . LDL goal <70 . Asymptomatic while on ranexa and imdur 90 mg- no chest pain or shortness of breath  Lab Results  Component Value Date   CHOL 138 01/12/2022   HDL 46 01/12/2022   LDLCALC 66 01/12/2022   LDLDIRECT 55.0 12/30/2018   TRIG 150 (H) 01/12/2022   CHOLHDL 3.0 01/12/2022  A/P: coronary artery disease asymptomatic while on ranexa and imdur- continue  current medications  Lipids at goal with LDL under 70- continue current medications    # Hypertension/CKD IIIb- possible stage IV S:compliant with  Amlodipine 2.5, imdur , lisinopril 2.5 mg, metoprolol  XR -on lisinopril in case proteinuric element. GFR usually 30-40 range - home reading 123/70 BP Readings from Last 3 Encounters:  06/25/22 123/70  02/19/22  118/64  01/11/22 138/72  A/P: hypertension - stable- continue current medicines   CKD IIIb listed but IV likely- planned to check today but our lab techs are not avialable- he prefers next time  # Asthma S: compliant with Symbicort 80-4.5 mg 2 puffs twice daily. 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 Future Appointments  Date Time Provider Department Center  07/12/2022 11:00 AM [facility] ANNUAL WELLNESS VISIT 1 [facility]   Lab/Order associations:   ICD-10-CM   1. Type 2 diabetes mellitus with diabetic nephropathy, without long-term current use of insulin  E11.21 POCT HgB A1C    2. Coronary artery disease involving native coronary artery of native heart with angina pectoris Chronic I25.119     3. Stage 3b chronic kidney disease Chronic N18.32       Meds ordered this encounter  Medications   traMADol (ULTRAM) 50 MG tablet    Sig: Take 1 tablet twice a day as needed    Dispense:  60 tablet    Refill:  2    Return precautions advised.  Tana Conch,  MD

## 2022-06-25 NOTE — Patient Instructions (Addendum)
Team please request his eye exam  Congrats on excellent a1c- continue current medications . Since a1c is at 7 only on glipizide and off metformin and jardiance- continue on glipizide alone and focus on healthy eating/regular exercis Lab Results  Component Value Date   HGBA1C 7.0 (A) 06/25/2022   Recommended follow up: Return in about 4 months (around 10/25/2022) for physical or sooner if needed.Schedule b4 you leave.

## 2022-06-29 ENCOUNTER — Other Ambulatory Visit: Payer: Self-pay | Admitting: Family Medicine

## 2022-07-12 ENCOUNTER — Ambulatory Visit (INDEPENDENT_AMBULATORY_CARE_PROVIDER_SITE_OTHER): Payer: PPO

## 2022-07-12 VITALS — BP 140/68 | HR 81 | Temp 97.3°F | Wt 176.0 lb

## 2022-07-12 DIAGNOSIS — Z0000 Encounter for general adult medical examination without abnormal findings: Secondary | ICD-10-CM

## 2022-07-12 NOTE — Progress Notes (Signed)
Subjective:   Roy [name] is a 89 y.o. male who presents for Medicare Annual/Subsequent preventive examination.  Review of Systems     Cardiac Risk Factors include: advanced age (>42men, >46 women);dyslipidemia;diabetes mellitus;male gender;hypertension     Objective:    Today's Vitals   07/12/22 1034  BP: (!) 140/68  Pulse: 81  Temp: (!) 97.3 F (36.3 C)  SpO2: 97%  Weight: 176 lb (79.8 kg)   Body mass index is 25.62 kg/m.     07/12/2022   11:01 AM 06/27/2021   10:49 AM 12/28/2019    2:52 PM 01/20/2019    6:34 AM 01/14/2019   11:03 AM 11/27/2018   12:35 PM 04/04/2018   11:30 AM  Advanced Directives  Does Patient Have a Medical Advance Directive? No No No No No No   Would patient like information on creating a medical advance directive? No - Patient declined No - Patient declined No - Patient declined No - Patient declined No - Patient declined Yes (MAU/Ambulatory/Procedural Areas - Information given) Yes (Inpatient - patient requests chaplain consult to create a medical advance directive)    Current Medications (verified) Outpatient Encounter Medications as of 07/12/2022  Medication Sig   acetaminophen (TYLENOL) 325 MG tablet Take 650 mg by mouth every evening. Reported on 06/28/2015   albuterol (VENTOLIN HFA) 108 (90 Base) MCG/ACT inhaler Inhale 2 puffs into the lungs every 6 (six) hours as needed for wheezing or shortness of breath.   amLODipine (NORVASC) 2.5 MG tablet Take 1 tablet by mouth once daily   aspirin EC 81 MG tablet Take 1 tablet (81 mg total) by mouth daily.   ASSURE COMFORT LANCETS 30G MISC 1 each by Other route daily.   atorvastatin (LIPITOR) 20 MG tablet Take 1 tablet (20 mg total) by mouth daily.   Blood Glucose Monitoring Suppl (ACCU-CHEK AVIVA PLUS) w/Device KIT Use to test blood sugars once daily. Dx: E11.9   budesonide-formoterol (SYMBICORT) 80-4.5 MCG/ACT inhaler Inhale 2 puffs into the lungs 2 (two) times daily.   calcium-vitamin D (OSCAL WITH D)  500-200 MG-UNIT per tablet Take 1 tablet by mouth daily.   glipiZIDE 2.5 MG TABS Take 1 tablet by mouth daily before breakfast.   glucose blood (ACCU-CHEK AVIVA PLUS) test strip Test blood glucose daily. Dx: E11.9   guaiFENesin (MUCINEX) 600 MG 12 hr tablet Take by mouth 2 (two) times daily.   isosorbide mononitrate (IMDUR) 30 MG 24 hr tablet Take 3 tablets (90 mg total) by mouth daily.   latanoprost (XALATAN) 0.005 % ophthalmic solution INSTILL 1 DROP INTO EACH EYE NIGHTLY   lisinopril (ZESTRIL) 5 MG tablet Take 0.5 tablets (2.5 mg total) by mouth daily.   metoprolol succinate (TOPROL-XL) 50 MG 24 hr tablet TAKE 1 TABLET BY MOUTH ONCE DAILY (TAKE  WITH  OR  IMMEDATELY  FOLLOWING  A  MEAL)   nitroGLYCERIN (NITROSTAT) 0.4 MG SL tablet DISSOLVE ONE TABLET UNDER THE TONGUE EVERY FIVE MINUTES AS NEEDED FOR CHEST PAIN   Omega-3 Fatty Acids (FISH OIL PO) Take 2 capsules by mouth 2 (two) times daily.   omeprazole (PRILOSEC) 20 MG capsule Take 1 capsule (20 mg total) by mouth 2 (two) times daily.   Polyethyl Glycol-Propyl Glycol (SYSTANE OP) Place 1 drop into both eyes 4 (four) times daily. Itchy/dry eye   potassium citrate (UROCIT-K) 10 MEQ (1080 MG) SR tablet Take 10 mEq by mouth Twice daily.   ranolazine (RANEXA) 500 MG 12 hr tablet TAKE 1 TABLET BY MOUTH  TWICE DAILY KEEP UPCOMING APPOINTMENT IN OCTOBER   traMADol (ULTRAM) 50 MG tablet Take 1 tablet twice a day as needed   traZODone (DESYREL) 150 MG tablet Take 1 tablet by mouth once daily   zinc sulfate 220 MG capsule Take 220 mg by mouth daily.   PROAIR HFA 108 (90 Base) MCG/ACT inhaler INHALE 2 PUFFS BY MOUTH EVERY 4 HOURS AS NEEDED FOR WHEEZING OR  SHORTNESS  OF  BREATH (Patient not taking: Reported on 07/12/2022)   No facility-administered encounter medications on file as of 07/12/2022.    Allergies (verified) Codeine sulfate, Crestor [rosuvastatin calcium], and Cephalexin   History: Past Medical History:  Diagnosis Date   Anginal pain (HCC)     Arthritis    spine   Asthma    CAD (coronary artery disease)    s/p PCI of the D1 with cath in 2013 showing nonobstructive disease.  CP presumed due to small vessel disease.   CAP (community acquired pneumonia) 12/26/2011   Chronic kidney disease    renal insufficiency   COPD (chronic obstructive pulmonary disease) (HCC)    Diabetes mellitus, type 2 (HCC)    Fever 12/26/2011   GERD (gastroesophageal reflux disease)    Heart murmur    Hyperlipidemia    Hypertension    Lyme disease    states had shakes that were thought to be parkinson's related but related to tick bite, states also RMSF   Myocardial infarction [facility]    Right rotator cuff tear    Shortness of breath    Sleep apnea    intolerant to PAP   Past Surgical History:  Procedure Laterality Date   APPENDECTOMY     CARDIAC CATHETERIZATION  2013   CERVICAL LAMINECTOMY     CHOLECYSTECTOMY     ESOPHAGOGASTRODUODENOSCOPY N/A 04/04/2018   Procedure: ESOPHAGOGASTRODUODENOSCOPY (EGD);  Surgeon: Toney Reil, MD;  Location: [facility] ENDOSCOPY;  Service: Gastroenterology;  Laterality: N/A;   KIDNEY STONE SURGERY     LEFT HEART CATH N/A 02/15/2012   Procedure: LEFT HEART CATH;  Surgeon: Laurey Morale, MD;  Location: [facility] CATH LAB;  Service: Cardiovascular;  Laterality: N/A;   SHOULDER ARTHROSCOPY WITH ROTATOR CUFF REPAIR AND SUBACROMIAL DECOMPRESSION Right 01/20/2019   Procedure: SHOULDER ARTHROSCOPY WITH ROTATOR CUFF REPAIR AND SUBACROMIAL DECOMPRESSION, BICEPS TENOTOMY;  Surgeon: Jones Broom, MD;  Location: [facility];  Service: Orthopedics;  Laterality: Right;   Family History  Problem Relation Age of Onset   ALS Father    Asthma Daughter    Cancer Sister        breast/liver   Cancer Brother        bone cancer   Social History   Socioeconomic History   Marital status: Married    Spouse name: Not on file   Number of children: Not on file   Years of education: Not on file   Highest education  level: Not on file  Occupational History   Not on file  Tobacco Use   Smoking status: Former    Packs/day: 1.50    Years: 8.00    Additional pack years: 0.00    Total pack years: 12.00    Types: Cigarettes    Quit date: 03/05/1958    Years since quitting: 64.3   Smokeless tobacco: Never  Vaping Use   Vaping Use: Never used  Substance and Sexual Activity   Alcohol use: No    Alcohol/week: 0.0 standard drinks of alcohol   Drug use: No  Sexual activity: Not Currently  Other Topics Concern   Not on file  Social History Narrative   Married (wife patient of Dr. Durene Cal). 3 children. 4 grandchildren. 3 greatgrandchildren.       Retired from [employer]      Hobbies: rabbit hunting, time with dogs   Social Determinants of Health   Financial Resource Strain: Low Risk  (07/12/2022)   Overall Financial Resource Strain (CARDIA)    Difficulty of Paying Living Expenses: Not hard at all  Food Insecurity: No Food Insecurity (07/12/2022)   Hunger Vital Sign    Worried About Running Out of Food in the Last Year: Never true    Ran Out of Food in the Last Year: Never true  Transportation Needs: No Transportation Needs (07/12/2022)   PRAPARE - Administrator, Civil Service (Medical): No    Lack of Transportation (Non-Medical): No  Physical Activity: Sufficiently Active (07/12/2022)   Exercise Vital Sign    Days of Exercise per Week: 7 days    Minutes of Exercise per Session: 60 min  Stress: No Stress Concern Present (07/12/2022)   [employer] of Occupational Health - Occupational Stress Questionnaire    Feeling of Stress : Not at all  Social Connections: Moderately Integrated (07/12/2022)   Social Connection and Isolation Panel [NHANES]    Frequency of Communication with Friends and Family: More than three times a week    Frequency of Social Gatherings with Friends and Family: Twice a week    Attends Religious Services: More than 4 times per year    Active  Member of [employer] or Organizations: No    Attends Engineer, structural: Never    Marital Status: Married    Tobacco Counseling Counseling given: Not Answered   Clinical Intake:  Pre-visit preparation completed: Yes  Pain : No/denies pain     BMI - recorded: 25.62 Nutritional Status: BMI 25 -29 Overweight Nutritional Risks: None Diabetes: Yes CBG done?: No Did pt. bring in CBG monitor from home?: No  How often do you need to have someone help you when you read instructions, pamphlets, or other written materials from your doctor or pharmacy?: 1 - Never  Diabetic?Nutrition Risk Assessment:  Has the patient had any N/V/D within the last 2 months?  No  Does the patient have any non-healing wounds?  No  Has the patient had any unintentional weight loss or weight gain?  No   Diabetes:  Is the patient diabetic?  Yes  If diabetic, was a CBG obtained today?  No  Did the patient bring in their glucometer from home?  No  How often do you monitor your CBG's? As needed .   Financial Strains and Diabetes Management:  Are you having any financial strains with the device, your supplies or your medication? No .  Does the patient want to be seen by Chronic Care Management for management of their diabetes?  No  Would the patient like to be referred to a Nutritionist or for Diabetic Management?  No   Diabetic Exams:  Diabetic Eye Exam: Overdue for diabetic eye exam. Pt has been advised about the importance in completing this exam. Patient advised to call and schedule an eye exam. Diabetic Foot Exam: Completed 10/24/21   Interpreter Needed?: No  Information entered by :: Lanier Ensign, LPN   Activities of Daily Living    07/12/2022   11:03 AM  In your present state of health, do you have any  difficulty performing the following activities:  Hearing? 0  Vision? 0  Difficulty concentrating or making decisions? 0  Walking or climbing stairs? 0  Dressing or bathing? 0   Doing errands, shopping? 0  Preparing Food and eating ? N  Using the Toilet? N  In the past six months, have you accidently leaked urine? N  Do you have problems with loss of bowel control? N  Managing your Medications? N  Managing your Finances? N  Housekeeping or managing your Housekeeping? N    Patient Care Team: Shelva Majestic, MD as PCP - General (Family Medicine) Quintella Reichert, MD as PCP - Cardiology (Cardiology) Quintella Reichert, MD as Consulting Physician (Cardiology) Lupita Leash, MD as Consulting Physician (Pulmonary Disease) [facility], Dory Peru, MD as Attending Physician Aris Lot, MD as Consulting Physician (Dermatology) Jethro Bolus, MD as Consulting Physician (Ophthalmology) Judi Saa, DO as Consulting Physician (Sports Medicine) Dahlia Byes, [facility] (Inactive) as Pharmacist (Pharmacist)  Indicate any recent Medical Services you may have received from other than [facility] providers in the past year (date may be approximate).     Assessment:   This is a routine wellness examination for Roy [name].  Hearing/Vision screen Hearing Screening - Comments:: Pt stated slight loss  Vision Screening - Comments:: Pt follows up with dr Nile Riggs   Dietary issues and exercise activities discussed: Current Exercise Habits: Home exercise routine, Type of exercise: Other - see comments, Time (Minutes): > 60, Frequency (Times/Week): 7, Weekly Exercise (Minutes/Week): 0   Goals Addressed             This Visit's Progress    Patient Stated       Keep on keeping on        Depression Screen    07/12/2022   11:01 AM 06/27/2021   10:48 AM 05/09/2020   10:41 AM 12/28/2019    2:51 PM 04/28/2019   10:42 AM 01/27/2019   11:22 AM 11/27/2018   12:35 PM  PHQ 2/9 Scores  PHQ - 2 Score 0 0 0 0 0 3 0  PHQ- 9 Score     0 9     Fall Risk    07/12/2022   11:02 AM 06/27/2021   10:49 AM 05/09/2020   10:41 AM 12/28/2019    2:53 PM 10/29/2019    1:19 PM  Fall Risk    Falls in the past year? 0 0 0 1 0  Number falls in past yr: 0 0 0 1 0  Injury with Fall? 0 0 0 1 0  Comment    hurt eye   Risk for fall due to : Impaired vision Impaired vision     Follow up Falls prevention discussed Falls prevention discussed  Falls prevention discussed     FALL RISK PREVENTION PERTAINING TO THE HOME:  Any stairs in or around the home? Yes  If so, are there any without handrails? No  Home free of loose throw rugs in walkways, pet beds, electrical cords, etc? Yes  Adequate lighting in your home to reduce risk of falls? Yes   ASSISTIVE DEVICES UTILIZED TO PREVENT FALLS:  Life alert? No  Use of a cane, walker or w/c? No  Grab bars in the bathroom? No  Shower chair or bench in shower? Yes  Elevated toilet seat or a handicapped toilet? No   TIMED UP AND GO:  Was the test performed? Yes .  Length of time to ambulate 10 feet: 15 sec.   Gait  steady and fast without use of assistive device  Cognitive Function:        07/12/2022   11:03 AM 06/27/2021   10:51 AM 12/28/2019    2:56 PM 11/27/2018   12:38 PM  6CIT Screen  What Year? 0 points 0 points 0 points 0 points  What month? 0 points 0 points 0 points 0 points  What time? 0 points 0 points  0 points  Count back from 20 0 points 0 points 0 points 0 points  Months in reverse 0 points 2 points 0 points 0 points  Repeat phrase 4 points 2 points 6 points 0 points  Total Score 4 points 4 points  0 points    Immunizations Immunization History  Administered Date(s) Administered   Influenza Split 12/04/2010, 01/23/2012   Influenza Whole 12/26/2006, 11/28/2007, 11/24/2008, 12/30/2009   Influenza, High Dose Seasonal PF 01/13/2013, 12/10/2013, 11/21/2017, 11/19/2018, 11/02/2020, 11/10/2021   Influenza-Unspecified 10/31/2015, 11/21/2016, 12/25/2019   Moderna SARS-COV2 Booster Vaccination 11/10/2021   Moderna Sars-Covid-2 Vaccination 03/15/2019, 04/26/2019, 02/02/2020, 08/15/2020   PNEUMOCOCCAL CONJUGATE-20  11/02/2020   Pneumococcal Conjugate-13 07/27/2014   Pneumococcal Polysaccharide-23 06/27/2005   Td 06/27/2005, 04/09/2017   Zoster Recombinat (Shingrix) 09/30/2018, 12/22/2018   Zoster, Live 10/31/2015    TDAP status: Up to date  Flu Vaccine status: Up to date  Pneumococcal vaccine status: Up to date  Covid-19 vaccine status: Completed vaccines  Qualifies for Shingles Vaccine? Yes   Zostavax completed Yes   Shingrix Completed?: Yes  Screening Tests Health Maintenance  Topic Date Due   COVID-19 Vaccine (5 - 2023-24 season) 01/05/2022   OPHTHALMOLOGY EXAM  05/23/2022   INFLUENZA VACCINE  10/04/2022   FOOT EXAM  10/25/2022   HEMOGLOBIN A1C  12/25/2022   Medicare Annual Wellness (AWV)  07/12/2023   DTaP/Tdap/Td (3 - Tdap) 04/10/2027   Pneumonia Vaccine 11+ Years old  Completed   Zoster Vaccines- Shingrix  Completed   HPV VACCINES  Aged Out    Health Maintenance  Health Maintenance Due  Topic Date Due   COVID-19 Vaccine (5 - 2023-24 season) 01/05/2022   OPHTHALMOLOGY EXAM  05/23/2022    Colorectal cancer screening: No longer required.     Additional Screening:    Vision Screening: Recommended annual ophthalmology exams for early detection of glaucoma and other disorders of the eye. Is the patient up to date with their annual eye exam?  Yes  Who is the provider or what is the name of the office in which the patient attends annual eye exams? Dr Nile Riggs  If pt is not established with a provider, would they like to be referred to a provider to establish care? No .   Dental Screening: Recommended annual dental exams for proper oral hygiene  Community Resource Referral / Chronic Care Management: CRR required this visit?  No   CCM required this visit?  No      Plan:     I have personally reviewed and noted the following in the patient's chart:   Medical and social history Use of alcohol, tobacco or illicit drugs  Current medications and supplements  including opioid prescriptions. Patient is currently taking opioid prescriptions. Information provided to patient regarding non-opioid alternatives. Patient advised to discuss non-opioid treatment plan with their provider. Functional ability and status Nutritional status Physical activity Advanced directives List of other physicians Hospitalizations, surgeries, and ER visits in previous 12 months Vitals Screenings to include cognitive, depression, and falls Referrals and appointments  In addition, I have reviewed and  discussed with patient certain preventive protocols, quality metrics, and best practice recommendations. A written personalized care plan for preventive services as well as general preventive health recommendations were provided to patient.     Marzella Schlein, LPN   11/03/4780   Nurse Notes: none

## 2022-07-12 NOTE — Patient Instructions (Signed)
Roy [name] , Thank you for taking time to come for your Medicare Wellness Visit. I appreciate your ongoing commitment to your health goals. Please review the following plan we discussed and let me know if I can assist you in the future.   These are the goals we discussed:  Goals      patient     Keep maintaining and staying active      patient     Continue to stay healthy; Keep walking 3 to 4 times a week      Patient Stated     Living to 100     Patient Stated     Keep doing well        This is a list of the screening recommended for you and due dates:  Health Maintenance  Topic Date Due   COVID-19 Vaccine (5 - 2023-24 season) 01/05/2022   Eye exam for diabetics  05/23/2022   Flu Shot  10/04/2022   Complete foot exam   10/25/2022   Hemoglobin A1C  12/25/2022   Medicare Annual Wellness Visit  07/12/2023   DTaP/Tdap/Td vaccine (3 - Tdap) 04/10/2027   Pneumonia Vaccine  Completed   Zoster (Shingles) Vaccine  Completed   HPV Vaccine  Aged Out    Advanced directives: Advance directive discussed with you today. Even though you declined this today please call our office should you change your mind and we can give you the proper paperwork for you to fill out.   Conditions/risks identified: keep on keeping on   Next appointment: Follow up in one year for your annual wellness visit.   Preventive Care 63 Years and Older, Male  Preventive care refers to lifestyle choices and visits with your health care provider that can promote health and wellness. What does preventive care include? A yearly physical exam. This is also called an annual well check. Dental exams once or twice a year. Routine eye exams. Ask your health care provider how often you should have your eyes checked. Personal lifestyle choices, including: Daily care of your teeth and gums. Regular physical activity. Eating a healthy diet. Avoiding tobacco and drug use. Limiting alcohol use. Practicing safe  sex. Taking low doses of aspirin every day. Taking vitamin and mineral supplements as recommended by your health care provider. What happens during an annual well check? The services and screenings done by your health care provider during your annual well check will depend on your age, overall health, lifestyle risk factors, and family history of disease. Counseling  Your health care provider may ask you questions about your: Alcohol use. Tobacco use. Drug use. Emotional well-being. Home and relationship well-being. Sexual activity. Eating habits. History of falls. Memory and ability to understand (cognition). Work and work Astronomer. Screening  You may have the following tests or measurements: Height, weight, and BMI. Blood pressure. Lipid and cholesterol levels. These may be checked every 5 years, or more frequently if you are over 7 years old. Skin check. Lung cancer screening. You may have this screening every year starting at age 27 if you have a 30-pack-year history of smoking and currently smoke or have quit within the past 15 years. Fecal occult blood test (FOBT) of the stool. You may have this test every year starting at age 58. Flexible sigmoidoscopy or colonoscopy. You may have a sigmoidoscopy every 5 years or a colonoscopy every 10 years starting at age 27. Prostate cancer screening. Recommendations will vary depending on your family history and other  risks. Hepatitis C blood test. Hepatitis B blood test. Sexually transmitted disease (STD) testing. Diabetes screening. This is done by checking your blood sugar (glucose) after you have not eaten for a while (fasting). You may have this done every 1-3 years. Abdominal aortic aneurysm (AAA) screening. You may need this if you are a current or former smoker. Osteoporosis. You may be screened starting at age 38 if you are at high risk. Talk with your health care provider about your test results, treatment options, and if  necessary, the need for more tests. Vaccines  Your health care provider may recommend certain vaccines, such as: Influenza vaccine. This is recommended every year. Tetanus, diphtheria, and acellular pertussis (Tdap, Td) vaccine. You may need a Td booster every 10 years. Zoster vaccine. You may need this after age 87. Pneumococcal 13-valent conjugate (PCV13) vaccine. One dose is recommended after age 50. Pneumococcal polysaccharide (PPSV23) vaccine. One dose is recommended after age 31. Talk to your health care provider about which screenings and vaccines you need and how often you need them. This information is not intended to replace advice given to you by your health care provider. Make sure you discuss any questions you have with your health care provider. Document Released: 03/18/2015 Document Revised: 11/09/2015 Document Reviewed: 12/21/2014 Elsevier Interactive Patient Education  2017 [employer].  Fall Prevention in the Home Falls can cause injuries. They can happen to people of all ages. There are many things you can do to make your home safe and to help prevent falls. What can I do on the outside of my home? Regularly fix the edges of walkways and driveways and fix any cracks. Remove anything that might make you trip as you walk through a door, such as a raised step or threshold. Trim any bushes or trees on the path to your home. Use bright outdoor lighting. Clear any walking paths of anything that might make someone trip, such as rocks or tools. Regularly check to see if handrails are loose or broken. Make sure that both sides of any steps have handrails. Any raised decks and porches should have guardrails on the edges. Have any leaves, snow, or ice cleared regularly. Use sand or salt on walking paths during winter. Clean up any spills in your garage right away. This includes oil or grease spills. What can I do in the bathroom? Use night lights. Install grab bars by the toilet  and in the tub and shower. Do not use towel bars as grab bars. Use non-skid mats or decals in the tub or shower. If you need to sit down in the shower, use a plastic, non-slip stool. Keep the floor dry. Clean up any water that spills on the floor as soon as it happens. Remove soap buildup in the tub or shower regularly. Attach bath mats securely with double-sided non-slip rug tape. Do not have throw rugs and other things on the floor that can make you trip. What can I do in the bedroom? Use night lights. Make sure that you have a light by your bed that is easy to reach. Do not use any sheets or blankets that are too big for your bed. They should not hang down onto the floor. Have a firm chair that has side arms. You can use this for support while you get dressed. Do not have throw rugs and other things on the floor that can make you trip. What can I do in the kitchen? Clean up any spills right away.  Avoid walking on wet floors. Keep items that you use a lot in easy-to-reach places. If you need to reach something above you, use a strong step stool that has a grab bar. Keep electrical cords out of the way. Do not use floor polish or wax that makes floors slippery. If you must use wax, use non-skid floor wax. Do not have throw rugs and other things on the floor that can make you trip. What can I do with my stairs? Do not leave any items on the stairs. Make sure that there are handrails on both sides of the stairs and use them. Fix handrails that are broken or loose. Make sure that handrails are as long as the stairways. Check any carpeting to make sure that it is firmly attached to the stairs. Fix any carpet that is loose or worn. Avoid having throw rugs at the top or bottom of the stairs. If you do have throw rugs, attach them to the floor with carpet tape. Make sure that you have a light switch at the top of the stairs and the bottom of the stairs. If you do not have them, ask someone to add  them for you. What else can I do to help prevent falls? Wear shoes that: Do not have high heels. Have rubber bottoms. Are comfortable and fit you well. Are closed at the toe. Do not wear sandals. If you use a stepladder: Make sure that it is fully opened. Do not climb a closed stepladder. Make sure that both sides of the stepladder are locked into place. Ask someone to hold it for you, if possible. Clearly mark and make sure that you can see: Any grab bars or handrails. First and last steps. Where the edge of each step is. Use tools that help you move around (mobility aids) if they are needed. These include: Canes. Walkers. Scooters. Crutches. Turn on the lights when you go into a dark area. Replace any light bulbs as soon as they burn out. Set up your furniture so you have a clear path. Avoid moving your furniture around. If any of your floors are uneven, fix them. If there are any pets around you, be aware of where they are. Review your medicines with your doctor. Some medicines can make you feel dizzy. This can increase your chance of falling. Ask your doctor what other things that you can do to help prevent falls. This information is not intended to replace advice given to you by your health care provider. Make sure you discuss any questions you have with your health care provider. Document Released: 12/16/2008 Document Revised: 07/28/2015 Document Reviewed: 03/26/2014 Elsevier Interactive Patient Education  2017 [employer].

## 2022-07-13 ENCOUNTER — Other Ambulatory Visit: Payer: Self-pay

## 2022-07-13 MED ORDER — NITROGLYCERIN 0.4 MG SL SUBL: SUBLINGUAL_TABLET | SUBLINGUAL | 1 refills | Status: DC

## 2022-09-15 ENCOUNTER — Other Ambulatory Visit: Payer: Self-pay | Admitting: Family Medicine

## 2022-10-06 ENCOUNTER — Other Ambulatory Visit: Payer: Self-pay | Admitting: Family Medicine

## 2022-10-13 ENCOUNTER — Other Ambulatory Visit: Payer: Self-pay | Admitting: Family Medicine

## 2022-10-22 DIAGNOSIS — Z87442 Personal history of urinary calculi: Secondary | ICD-10-CM | POA: Diagnosis not present

## 2022-11-04 ENCOUNTER — Other Ambulatory Visit: Payer: Self-pay | Admitting: Cardiology

## 2022-11-12 DIAGNOSIS — H401131 Primary open-angle glaucoma, bilateral, mild stage: Secondary | ICD-10-CM | POA: Diagnosis not present

## 2022-11-16 ENCOUNTER — Ambulatory Visit (INDEPENDENT_AMBULATORY_CARE_PROVIDER_SITE_OTHER): Payer: PPO | Admitting: Family Medicine

## 2022-11-16 ENCOUNTER — Encounter: Payer: Self-pay | Admitting: Family Medicine

## 2022-11-16 VITALS — BP 132/68 | HR 63 | Temp 98.0°F | Ht 69.5 in | Wt 180.0 lb

## 2022-11-16 DIAGNOSIS — E1121 Type 2 diabetes mellitus with diabetic nephropathy: Secondary | ICD-10-CM

## 2022-11-16 DIAGNOSIS — E1169 Type 2 diabetes mellitus with other specified complication: Secondary | ICD-10-CM | POA: Diagnosis not present

## 2022-11-16 DIAGNOSIS — I10 Essential (primary) hypertension: Secondary | ICD-10-CM | POA: Diagnosis not present

## 2022-11-16 DIAGNOSIS — I25119 Atherosclerotic heart disease of native coronary artery with unspecified angina pectoris: Secondary | ICD-10-CM

## 2022-11-16 DIAGNOSIS — Z0000 Encounter for general adult medical examination without abnormal findings: Secondary | ICD-10-CM | POA: Diagnosis not present

## 2022-11-16 DIAGNOSIS — Z7984 Long term (current) use of oral hypoglycemic drugs: Secondary | ICD-10-CM

## 2022-11-16 DIAGNOSIS — E785 Hyperlipidemia, unspecified: Secondary | ICD-10-CM

## 2022-11-16 LAB — MICROALBUMIN / CREATININE URINE RATIO
Creatinine,U: 64.8 mg/dL
Microalb Creat Ratio: 38.5 mg/g — ABNORMAL HIGH (ref 0.0–30.0)
Microalb, Ur: 24.9 mg/dL — ABNORMAL HIGH (ref 0.0–1.9)

## 2022-11-16 LAB — LIPID PANEL
Cholesterol: 113 mg/dL (ref 0–200)
HDL: 42.8 mg/dL (ref >39.00)
LDL Cholesterol: 47 mg/dL (ref 0–99)
NonHDL: 70.47
Total CHOL/HDL Ratio: 3
Triglycerides: 117 mg/dL (ref 0.0–149.0)
VLDL: 23.4 mg/dL (ref 0.0–40.0)

## 2022-11-16 LAB — COMPREHENSIVE METABOLIC PANEL
ALT: 11 U/L (ref 0–53)
AST: 12 U/L (ref 0–37)
Albumin: 4.2 g/dL (ref 3.5–5.2)
Alkaline Phosphatase: 69 U/L (ref 39–117)
BUN: 33 mg/dL — ABNORMAL HIGH (ref 6–23)
CO2: 25 meq/L (ref 19–32)
Calcium: 9.2 mg/dL (ref 8.4–10.5)
Chloride: 105 meq/L (ref 96–112)
Creatinine, Ser: 1.99 mg/dL — ABNORMAL HIGH (ref 0.40–1.50)
GFR: 29.53 mL/min — ABNORMAL LOW (ref >60.00)
Glucose, Bld: 161 mg/dL — ABNORMAL HIGH (ref 70–99)
Potassium: 3.7 meq/L (ref 3.5–5.1)
Sodium: 139 meq/L (ref 135–145)
Total Bilirubin: 0.8 mg/dL (ref 0.2–1.2)
Total Protein: 6.6 g/dL (ref 6.0–8.3)

## 2022-11-16 LAB — CBC WITH DIFFERENTIAL/PLATELET
Basophils Absolute: 0.1 10*3/uL (ref 0.0–0.1)
Basophils Relative: 0.6 % (ref 0.0–3.0)
Eosinophils Absolute: 1.7 10*3/uL — ABNORMAL HIGH (ref 0.0–0.7)
Eosinophils Relative: 20.4 % — ABNORMAL HIGH (ref 0.0–5.0)
HCT: 36 % — ABNORMAL LOW (ref 39.0–52.0)
Hemoglobin: 12 g/dL — ABNORMAL LOW (ref 13.0–17.0)
Lymphocytes Relative: 17.7 % (ref 12.0–46.0)
Lymphs Abs: 1.5 10*3/uL (ref 0.7–4.0)
MCHC: 33.4 g/dL (ref 30.0–36.0)
MCV: 89 fl (ref 78.0–100.0)
Monocytes Absolute: 0.6 10*3/uL (ref 0.1–1.0)
Monocytes Relative: 6.9 % (ref 3.0–12.0)
Neutro Abs: 4.5 10*3/uL (ref 1.4–7.7)
Neutrophils Relative %: 54.4 % (ref 43.0–77.0)
Platelets: 139 10*3/uL — ABNORMAL LOW (ref 150.0–400.0)
RBC: 4.04 Mil/uL — ABNORMAL LOW (ref 4.22–5.81)
RDW: 14 % (ref 11.5–15.5)
WBC: 8.3 10*3/uL (ref 4.0–10.5)

## 2022-11-16 LAB — HEMOGLOBIN A1C: Hgb A1c MFr Bld: 7.5 % — ABNORMAL HIGH (ref 4.6–6.5)

## 2022-11-16 MED ORDER — BLOOD GLUCOSE MONITORING SUPPL DEVI: 1.0000 | Freq: Three times a day (TID) | 0 refills | Status: DC

## 2022-11-16 MED ORDER — LANCETS MISC. MISC: 1.0000 | Freq: Three times a day (TID) | 0 refills | Status: DC

## 2022-11-16 MED ORDER — BUDESONIDE-FORMOTEROL FUMARATE 80-4.5 MCG/ACT IN AERO: 2.0000 | INHALATION_SPRAY | Freq: Two times a day (BID) | RESPIRATORY_TRACT | 11 refills | Status: DC

## 2022-11-16 MED ORDER — LANCET DEVICE MISC: 1.0000 | Freq: Three times a day (TID) | 0 refills | Status: AC

## 2022-11-16 MED ORDER — BLOOD GLUCOSE TEST VI STRP: 1.0000 | ORAL_STRIP | Freq: Three times a day (TID) | 11 refills | Status: DC

## 2022-11-16 NOTE — Patient Instructions (Addendum)
Get diabetic eye exam scheduled.   Get an updated dental check up   Glad you had an awesome 25 year birthday! Great to see you today  Please stop by lab before you go If you have mychart- we will send your results within 3 business days of [place] receiving them.  If you do not have mychart- we will call you about results within 5 business days of [place] receiving them.  *please also note that you will see labs on mychart as soon as they post. I will later go in and write notes on them- will say "notes from Dr. Durene Cal"   Recommended follow up: Return in about 4 months (around 03/18/2023) for followup or sooner if needed.Schedule b4 you leave.

## 2022-11-16 NOTE — Progress Notes (Signed)
Phone: [phone]   Subjective:  Patient presents today for their annual physical. Chief complaint-noted.   See problem oriented charting- ROS- full  review of systems was completed and negative  except for: chronic hand pain  The following were reviewed and entered/updated in epic: Past Medical History:  Diagnosis Date   Anginal pain (HCC)    Arthritis    spine   Asthma    CAD (coronary artery disease)    s/p PCI of the D1 with cath in 2013 showing nonobstructive disease.  CP presumed due to small vessel disease.   CAP (community acquired pneumonia) 12/26/2011   Chronic kidney disease    renal insufficiency   COPD (chronic obstructive pulmonary disease) (HCC)    Diabetes mellitus, type 2 (HCC)    Fever 12/26/2011   GERD (gastroesophageal reflux disease)    Heart murmur    Hyperlipidemia    Hypertension    Lyme disease    states had shakes that were thought to be parkinson's related but related to tick bite, states also RMSF   Myocardial infarction (HCC)    Right rotator cuff tear    Shortness of breath    Sleep apnea    intolerant to PAP   Patient Active Problem List   Diagnosis Date Noted   Type II diabetes mellitus with renal manifestations (HCC) 09/11/2006    Priority: High   Coronary artery disease with angina pectoris (HCC) 09/11/2006    Priority: High   Mild persistent asthma 10/30/2010    Priority: Medium    Stage 3b chronic kidney disease (HCC) 02/22/2009    Priority: Medium    Hyperlipidemia associated with type 2 diabetes mellitus (HCC) 09/11/2006    Priority: Medium    Essential hypertension 09/11/2006    Priority: Medium    Back pain 09/11/2006    Priority: Medium    Sleep apnea 09/11/2006    Priority: Medium    Food impaction of esophagus 04/03/2018    Priority: Low   GERD (gastroesophageal reflux disease) 05/03/2014    Priority: Low   Insomnia 05/03/2014    Priority: Low   Allergic rhinitis 04/21/2014    Priority: Low   Giardia  04/21/2014    Priority: Low   NEPHROLITHIASIS, RECURRENT 01/21/2009    Priority: Low   ACTINIC KERATOSIS, HEAD 06/14/2008    Priority: Low   Right rotator cuff tear 12/11/2018    Priority: 1.   Calcific tendinitis of right shoulder 09/03/2018    Priority: 1.   AC (acromioclavicular) arthritis 09/03/2018    Priority: 1.   Pain of right thumb 04/08/2018    Priority: 1.   Past Surgical History:  Procedure Laterality Date   APPENDECTOMY     CARDIAC CATHETERIZATION  2013   CERVICAL LAMINECTOMY     CHOLECYSTECTOMY     ESOPHAGOGASTRODUODENOSCOPY N/A 04/04/2018   Procedure: ESOPHAGOGASTRODUODENOSCOPY (EGD);  Surgeon: Toney Reil, MD;  Location: [facility]COPY;  Service: Gastroenterology;  Laterality: N/A;   KIDNEY STONE SURGERY     LEFT HEART CATH N/A 02/15/2012   Procedure: LEFT HEART CATH;  Surgeon: Laurey Morale, MD;  Location: [facility] CATH LAB;  Service: Cardiovascular;  Laterality: N/A;   SHOULDER ARTHROSCOPY WITH ROTATOR CUFF REPAIR AND SUBACROMIAL DECOMPRESSION Right 01/20/2019   Procedure: SHOULDER ARTHROSCOPY WITH ROTATOR CUFF REPAIR AND SUBACROMIAL DECOMPRESSION, BICEPS TENOTOMY;  Surgeon: Jones Broom, MD;  Location: [facility];  Service: Orthopedics;  Laterality: Right;    Family History  Problem Relation Age of Onset  ALS Father    Asthma Daughter    Cancer Sister        breast/liver   Cancer Brother        bone cancer    Medications- reviewed and updated Current Outpatient Medications  Medication Sig Dispense Refill   acetaminophen (TYLENOL) 325 MG tablet Take 650 mg by mouth every evening. Reported on 06/28/2015     albuterol (VENTOLIN HFA) 108 (90 Base) MCG/ACT inhaler Inhale 2 puffs into the lungs every 6 (six) hours as needed for wheezing or shortness of breath. 1 each 2   amLODipine (NORVASC) 2.5 MG tablet Take 1 tablet by mouth once daily 90 tablet 2   aspirin EC 81 MG tablet Take 1 tablet (81 mg total) by mouth daily.     ASSURE  COMFORT LANCETS 30G MISC 1 each by Other route daily.     atorvastatin (LIPITOR) 20 MG tablet Take 1 tablet by mouth once daily 90 tablet 0   budesonide-formoterol (SYMBICORT) 80-4.5 MCG/ACT inhaler Inhale 2 puffs into the lungs 2 (two) times daily. 1 each 3   calcium-vitamin D (OSCAL WITH D) 500-200 MG-UNIT per tablet Take 1 tablet by mouth daily.     glipiZIDE 2.5 MG TABS Take 1 tablet by mouth daily before breakfast. 90 tablet 3   guaiFENesin (MUCINEX) 600 MG 12 hr tablet Take by mouth 2 (two) times daily.     isosorbide mononitrate (IMDUR) 30 MG 24 hr tablet Take 3 tablets (90 mg total) by mouth daily. 270 tablet 2   latanoprost (XALATAN) 0.005 % ophthalmic solution INSTILL 1 DROP INTO EACH EYE NIGHTLY     lisinopril (ZESTRIL) 5 MG tablet Take 0.5 tablets (2.5 mg total) by mouth daily. 45 tablet 3   metoprolol succinate (TOPROL-XL) 50 MG 24 hr tablet TAKE 1 TABLET BY MOUTH ONCE DAILY (TAKE  WITH  OR  IMMEDIATELY  FOLLOWING  A  MEAL) NEEDS APPOINTMENT FOR FUTURE REFILLS 90 tablet 0   nitroGLYCERIN (NITROSTAT) 0.4 MG SL tablet DISSOLVE ONE TABLET UNDER THE TONGUE EVERY FIVE MINUTES AS NEEDED FOR CHEST PAIN 25 tablet 1   Omega-3 Fatty Acids (FISH OIL PO) Take 2 capsules by mouth 2 (two) times daily.     omeprazole (PRILOSEC) 20 MG capsule Take 1 capsule by mouth twice daily 180 capsule 0   Polyethyl Glycol-Propyl Glycol (SYSTANE OP) Place 1 drop into both eyes 4 (four) times daily. Itchy/dry eye     potassium citrate (UROCIT-K) 10 MEQ (1080 MG) SR tablet Take 10 mEq by mouth Twice daily.     PROAIR HFA 108 (90 Base) MCG/ACT inhaler INHALE 2 PUFFS BY MOUTH EVERY 4 HOURS AS NEEDED FOR WHEEZING OR  SHORTNESS  OF  BREATH 9 g 0   ranolazine (RANEXA) 500 MG 12 hr tablet TAKE 1 TABLET BY MOUTH TWICE DAILY KEEP UPCOMING APPOINTMENT IN OCTOBER 180 tablet 3   traMADol (ULTRAM) 50 MG tablet Take 1 tablet twice a day as needed 60 tablet 2   traZODone (DESYREL) 150 MG tablet Take 1 tablet by mouth once daily  90 tablet 0   zinc sulfate 220 MG capsule Take 220 mg by mouth daily.     No current facility-administered medications for this visit.    Allergies-reviewed and updated Allergies  Allergen Reactions   Codeine Sulfate Other (See Comments)    Chest pain   Crestor [Rosuvastatin Calcium]    Cephalexin Rash    Social History   Social History Narrative   Married (wife patient  of Dr. Durene Cal). 3 children. 4 grandchildren. 3 greatgrandchildren.       Retired from [employer]      Hobbies: rabbit hunting, time with dogs   Objective  Objective:  BP 132/68 Comment: most recent home reading  Pulse 63   Temp 98 F (36.7 C)   Ht 5' 9.5" (1.765 m)   Wt 180 lb (81.6 kg)   SpO2 97%   BMI 26.20 kg/m  Gen: NAD, resting comfortably HEENT: Mucous membranes are moist. Oropharynx normal Neck: no thyromegaly CV: RRR no murmurs rubs or gallops Lungs: CTAB no crackles, wheeze, rhonchi Abdomen: soft/nontender/nondistended/normal bowel sounds. No rebound or guarding.  Ext: trace edema Skin: warm, dry Neuro: grossly normal, moves all extremities, PERRLA  Diabetic Foot Exam - Simple   Simple Foot Form Diabetic Foot exam was performed with the following findings: Yes 11/16/2022  8:40 AM  Visual Inspection No deformities, no ulcerations, no other skin breakdown bilaterally: Yes Sensation Testing Intact to touch and monofilament testing bilaterally: Yes Pulse Check Posterior Tibialis and Dorsalis pulse intact bilaterally: Yes Comments        Assessment and Plan  89 y.o. male presenting for annual physical.  Health Maintenance counseling: 1. Anticipatory guidance: Patient counseled regarding regular dental exams - q6 months, eye exams -yearly,  avoiding smoking and second hand smoke , limiting alcohol to 2 beverages per day - doesn't drink, no illicit drugs .   2. Risk factor reduction:  Advised patient of need for regular exercise and diet rich and fruits and vegetables  to reduce risk of heart attack and stroke.  Exercise[place].  Diet/weight management-weight down 6 pounds in the last year but stable from 2022. Has tried to continue to eat well- up slightly from last visit Wt Readings from Last 3 Encounters:  11/16/22 180 lb (81.6 kg)  07/12/22 176 lb (79.8 kg)  06/25/22 178 lb 9.6 oz (81 kg)  3. Immunizations/screenings/ancillary studies-already had flu shot and COVID shot this year-up-to-date  Immunization History  Administered Date(s) Administered   Covid-19, Mrna,Vaccine(Spikevax)11yrs and older 11/06/2022   Fluad Trivalent(High Dose 65+) 11/06/2022   Influenza Split 12/04/2010, 01/23/2012   Influenza Whole 12/26/2006, 11/28/2007, 11/24/2008, 12/30/2009   Influenza, High Dose Seasonal PF 01/13/2013, 12/10/2013, 11/21/2017, 11/19/2018, 11/02/2020, 11/10/2021   Influenza-Unspecified 10/31/2015, 11/21/2016, 12/25/2019   Moderna SARS-COV2 Booster Vaccination 11/10/2021   Moderna Sars-Covid-2 Vaccination 03/15/2019, 04/26/2019, 02/02/2020, 08/15/2020   PNEUMOCOCCAL CONJUGATE-20 11/02/2020   Pneumococcal Conjugate-13 07/27/2014   Pneumococcal Polysaccharide-23 06/27/2005   Td 06/27/2005, 04/09/2017   Zoster Recombinant(Shingrix) 09/30/2018, 12/22/2018   Zoster, Live 10/31/2015  4. Prostate cancer screening-  past age based screening recommendations.  No significant change in urinary symptoms-last year's visit should have noted the same Lab Results  Component Value Date   PSA 0.78 02/14/2009   PSA 0.80 08/13/2008   PSA 0.76 03/15/2008   5. Colon cancer screening -  past age based screening recommendations . No bright red blood per rectum  6. Skin cancer screening-saw dermatology last September and ultimately had basal cell removed. Follow up this month or next month. advised regular sunscreen use. Denies worrisome, changing, or new skin lesions.  7. Smoking associated screening (lung cancer screening, AAA screen 65-75, UA)-quit in  1960s-no regular screening 8. STD screening -only active with wife  Status of chronic or acute concerns   # Diabetes S:compliant with glipizide 2.5 mg daily -prior jardiance 10 mg but he accidentally stopped and stayed off - prior  Metformin 500mg  half tablet with breakfast-stop 02/19/2022 due to chronic kidney disease  -jardiance 10 mg off of- he stopped thinking we wanted him to stop that and metformin but a1c is at goal so continue glipizde alone Lab Results  Component Value Date   HGBA1C 7.0 (A) 06/25/2022   HGBA1C 9.0 (H) 02/19/2022   HGBA1C 7.2 (H) 10/24/2021  A/P: hopefully stable- update a1c today. Continue current meds for now  - consider restarting jardiance depending on if worsens or if any elevation in urine microalbumin/creatinine ratio  -needs new meter due to not being able to get strips with his accu check aviva- we sent this in  # CAD with angina/Hyperlipidemia- sees Dr. Mayford Knife S:compliant with atorvastatin 20mg  and aspirin 81mg . LDL goal <70 . Asymptomatic while on ranexa and imdur- no chest pain or shortness of breath  Lab Results  Component Value Date   CHOL 138 01/12/2022   HDL 46 01/12/2022   LDLCALC 66 01/12/2022   LDLDIRECT 55.0 12/30/2018   TRIG 150 (H) 01/12/2022   CHOLHDL 3.0 01/12/2022  A/P: coronary artery disease asymptomatic continue current medications Lipids at goal last year- update today   # Hypertension/CKD III--> IV S:compliant with  Amlodipine 2.5, imdur 90mg , lisinopril 2.5 mg, metoprolol 50mg  XR, imdur as well -on lisinopril in case proteinuric element. GFR usually 30-40 range BP Readings from Last 3 Encounters:  11/16/22 132/68  07/12/22 (!) 140/68  06/25/22 123/70  A/P: hypertension stable- continue current medicines .  CKD IV- hopefully stable- update cmp today.   # Asthma S: compliant with Symbicort 80-4.5 mg 2 puffs twice daily. uses albuterol almost never  A/P:stable- continue current medicines   #history of kidney stones-  remains on potassium citrate through urology   saw them last month.   #chronic pain for hand pain (also sees [facility])- tramadol takes a mild edge off of pain- willing to refill if needed- mainly at night after activity- still has some left for now-4-5 left  #insomnia- trazodone helping for sleep  at 150 mg  # GERD-Prilosec helpful   #Anemia and platelet issues in the past-hematology 01/13/2019 said no further work-up and released patient to as needed-overall largely stable -check again today Lab Results  Component Value Date   WBC 10.0 02/19/2022   HGB 13.6 02/19/2022   HCT 40.0 02/19/2022   MCV 86.2 02/19/2022   PLT 141.0 (L) 02/19/2022   Recommended follow up: Return in about 4 months (around 03/18/2023) for followup or sooner if needed.Schedule b4 you leave. Future Appointments  Date Time Provider Department Center  01/14/2023  8:00 AM Sharlene Dory, [place] [facility]  07/16/2023 10:30 AM [facility] ANNUAL WELLNESS VISIT 1 [facility]   Lab/Order associations: fasting   ICD-10-CM   1. Preventative health care  Z00.00     2. Coronary artery disease involving native coronary artery of native heart with angina pectoris (HCC)  I25.119     3. Type 2 diabetes mellitus with diabetic nephropathy, without long-term current use of insulin (HCC)  E11.21     4. Essential hypertension  I10     5. Hyperlipidemia associated with type 2 diabetes mellitus (HCC)  E11.69    E78.5       No orders of the defined types were placed in this encounter.   Return precautions advised.  Tana Conch, MD

## 2022-11-19 ENCOUNTER — Other Ambulatory Visit: Payer: Self-pay

## 2022-11-19 MED ORDER — TRAMADOL HCL 50 MG PO TABS: ORAL_TABLET | ORAL | 2 refills | Status: DC

## 2022-11-19 MED ORDER — LANCETS MISC. MISC: 0 refills | Status: AC

## 2022-11-19 MED ORDER — BLOOD GLUCOSE TEST VI STRP: ORAL_STRIP | 11 refills | Status: DC

## 2022-11-19 MED ORDER — BLOOD GLUCOSE MONITORING SUPPL DEVI: 3 refills | Status: AC

## 2022-11-19 NOTE — Progress Notes (Signed)
sent 

## 2022-11-21 ENCOUNTER — Telehealth: Payer: Self-pay

## 2022-11-21 ENCOUNTER — Other Ambulatory Visit: Payer: Self-pay

## 2022-11-21 MED ORDER — EMPAGLIFLOZIN 10 MG PO TABS: 10.0000 mg | ORAL_TABLET | Freq: Every day | ORAL | 11 refills | Status: DC

## 2022-11-21 NOTE — Telephone Encounter (Signed)
Pt needing PA for Tramadol per [facility].

## 2022-11-22 ENCOUNTER — Telehealth: Payer: Self-pay

## 2022-11-22 ENCOUNTER — Other Ambulatory Visit (HOSPITAL_COMMUNITY): Payer: Self-pay

## 2022-11-22 NOTE — Telephone Encounter (Signed)
Pharmacy Patient Advocate Encounter   Received notification from Pt Calls Messages that prior authorization for Tramadol 50mg  is required/requested.   Insurance verification completed.   The patient is insured through [employer]/ [employer] .   Per test claim: PA required; PA submitted to [employer]/ [employer] via Fax Key/confirmation #/EOC * Status is pending  Fax# [phone]

## 2022-11-26 ENCOUNTER — Other Ambulatory Visit (HOSPITAL_COMMUNITY): Payer: Self-pay

## 2022-11-26 DIAGNOSIS — L570 Actinic keratosis: Secondary | ICD-10-CM | POA: Diagnosis not present

## 2022-11-26 DIAGNOSIS — L814 Other melanin hyperpigmentation: Secondary | ICD-10-CM | POA: Diagnosis not present

## 2022-11-26 DIAGNOSIS — L821 Other seborrheic keratosis: Secondary | ICD-10-CM | POA: Diagnosis not present

## 2022-11-26 DIAGNOSIS — D692 Other nonthrombocytopenic purpura: Secondary | ICD-10-CM | POA: Diagnosis not present

## 2022-11-26 DIAGNOSIS — Z85828 Personal history of other malignant neoplasm of skin: Secondary | ICD-10-CM | POA: Diagnosis not present

## 2022-11-26 NOTE — Telephone Encounter (Signed)
Pharmacy Patient Advocate Encounter  Received notification from [employer] that Prior Authorization for Tramadol 50mg  has been  Approved. Placed a call to [facility] pharmacy to notify of the approval.

## 2022-12-10 DIAGNOSIS — H401131 Primary open-angle glaucoma, bilateral, mild stage: Secondary | ICD-10-CM | POA: Diagnosis not present

## 2022-12-13 ENCOUNTER — Other Ambulatory Visit: Payer: Self-pay | Admitting: Family Medicine

## 2022-12-31 DIAGNOSIS — M7711 Lateral epicondylitis, right elbow: Secondary | ICD-10-CM | POA: Diagnosis not present

## 2022-12-31 DIAGNOSIS — M65332 Trigger finger, left middle finger: Secondary | ICD-10-CM | POA: Diagnosis not present

## 2023-01-01 ENCOUNTER — Other Ambulatory Visit: Payer: Self-pay | Admitting: Family Medicine

## 2023-01-05 ENCOUNTER — Other Ambulatory Visit: Payer: Self-pay | Admitting: Family Medicine

## 2023-01-13 ENCOUNTER — Other Ambulatory Visit: Payer: Self-pay | Admitting: Family Medicine

## 2023-01-13 NOTE — Progress Notes (Unsigned)
  Cardiology Office Note:  .   Date:  01/14/2023  ID:  Jamse Belfast Sockwell, DOB 08-16-1932, MRN [id number] PCP: Shelva Majestic, MD  [facility] Providers Cardiologist:  Armanda Magic, MD {  History of Present Illness: .   ADI FINNEGAN is a 89 y.o. male with a history of CAD status post PTCA D1 in 1999 with repeat cath 12/2011 with nonobstructive disease (chest pain felt to be secondary to small vessel disease), CKD, diabetes, HTN, OSA intolerant to CPAP and hyperlipidemia.  He was seen 01/11/2022 and was doing well at that time.  Denied chest pain/pressure, SOB, DOE, PND, orthopnea, lower extremity edema, dizziness, palpitations, or syncope.  Compliant with medications with no side effects.  Today, he presents with a  history of coronary artery disease and diabetes, presents for a routine follow-up. The patient reports feeling well and continues to be active, managing a two-acre garden daily. The patient had a coronary artery stent placement in 1999 and has had no chest pain or other cardiac symptoms since then. The patient's diabetes is reportedly well-controlled with Jardiance, which was recently started. The patient's medication regimen also includes amlodipine, aspirin, Lipitor, Imdur, lisinopril, metoprolol succinate, nitroglycerin, and Ranexa. The patient has not needed to use nitroglycerin.  Reports no shortness of breath nor dyspnea on exertion. Reports no chest pain, pressure, or tightness. No edema, orthopnea, PND. Reports no palpitations.   ROS: Pertinent ROS in HPI   Studies Reviewed: .        None.   Physical Exam:   VS:  BP 128/60   Pulse 65   Ht 5' 9.5" (1.765 m)   Wt 177 lb 6.4 oz (80.5 kg)   SpO2 96%   BMI 25.82 kg/m    Wt Readings from Last 3 Encounters:  01/14/23 177 lb 6.4 oz (80.5 kg)  11/16/22 180 lb (81.6 kg)  07/12/22 176 lb (79.8 kg)    GEN: Well nourished, well developed in no acute distress NECK: No JVD; No carotid bruits CARDIAC: RRR, no murmurs,  rubs, gallops RESPIRATORY:  Clear to auscultation without rales, wheezing or rhonchi  ABDOMEN: Soft, non-tender, non-distended EXTREMITIES:  No edema; No deformity   ASSESSMENT AND PLAN: .    Coronary Artery Disease History of coronary artery stenting in 1999 with no reported chest pain since. EKG today shows no worrisome changes. -Continue current medications:Aspirin, Lipitor 20mg  daily, Imdur 90mg  daily, Ranexa 500mg  twice daily.  Hypertension Blood pressure well controlled at 128/60 today. -Continue current medications:Amlodipine 2.5mg  daily, Lisinopril 2.5mg  daily, Metoprolol succinate 50mg  daily.  Hyperlipidemia LDL 47, triglycerides 117 -continue Lipitor 20mg  once daily  Diabetes Mellitus Reports sugars have been running fairly well since starting Jardiance. -Continue Jardiance.  CKD -creatinine 1.9 which is his baseline -continue follow-up with nephrology    General Health Maintenance -Continue to monitor cholesterol and kidney function with primary care provider, Dr. Durene Cal. -Schedule follow-up appointment.      Dispo: He can follow-up in a year.  Signed, Sharlene Dory, PA-C

## 2023-01-14 ENCOUNTER — Encounter: Payer: Self-pay | Admitting: Physician Assistant

## 2023-01-14 ENCOUNTER — Ambulatory Visit: Payer: PPO | Attending: Physician Assistant | Admitting: Physician Assistant

## 2023-01-14 VITALS — BP 128/60 | HR 65 | Ht 69.5 in | Wt 177.4 lb

## 2023-01-14 DIAGNOSIS — E785 Hyperlipidemia, unspecified: Secondary | ICD-10-CM

## 2023-01-14 DIAGNOSIS — I2510 Atherosclerotic heart disease of native coronary artery without angina pectoris: Secondary | ICD-10-CM | POA: Diagnosis not present

## 2023-01-14 DIAGNOSIS — I10 Essential (primary) hypertension: Secondary | ICD-10-CM

## 2023-01-14 DIAGNOSIS — N1832 Chronic kidney disease, stage 3b: Secondary | ICD-10-CM

## 2023-01-14 MED ORDER — RANOLAZINE ER 500 MG PO TB12: ORAL_TABLET | ORAL | 3 refills | Status: DC

## 2023-01-14 MED ORDER — METOPROLOL SUCCINATE ER 50 MG PO TB24: ORAL_TABLET | ORAL | 0 refills | Status: DC

## 2023-01-14 NOTE — Patient Instructions (Signed)
Medication Instructions:  REFILLED Ranexa and Metoprolol *If you need a refill on your cardiac medications before your next appointment, please call your pharmacy*  Follow-Up: At [facility], you and your health needs are our priority.  As part of our continuing mission to provide you with exceptional heart care, we have created designated Provider Care Teams.  These Care Teams include your primary Cardiologist (physician) and Advanced Practice Providers (APPs -  Physician Assistants and Nurse Practitioners) who all work together to provide you with the care you need, when you need it.  We recommend signing up for the patient portal called "MyChart".  Sign up information is provided on this After Visit Summary.  MyChart is used to connect with patients for Virtual Visits (Telemedicine).  Patients are able to view lab/test results, encounter notes, upcoming appointments, etc.  Non-urgent messages can be sent to your provider as well.   To learn more about what you can do with MyChart, go to [email or web address].    Your next appointment:   1 year(s)  Provider:   Armanda Magic, MD

## 2023-01-20 ENCOUNTER — Other Ambulatory Visit: Payer: Self-pay | Admitting: Family Medicine

## 2023-02-04 ENCOUNTER — Other Ambulatory Visit: Payer: Self-pay | Admitting: Cardiology

## 2023-03-09 ENCOUNTER — Other Ambulatory Visit: Payer: Self-pay | Admitting: Family Medicine

## 2023-03-17 ENCOUNTER — Other Ambulatory Visit: Payer: Self-pay

## 2023-03-17 ENCOUNTER — Encounter (HOSPITAL_COMMUNITY): Payer: Self-pay | Admitting: Emergency Medicine

## 2023-03-17 ENCOUNTER — Ambulatory Visit (HOSPITAL_COMMUNITY)
Admission: EM | Admit: 2023-03-17 | Discharge: 2023-03-17 | Disposition: A | Discharge status: Home / Self Care | Payer: PPO

## 2023-03-17 DIAGNOSIS — S51812A Laceration without foreign body of left forearm, initial encounter: Secondary | ICD-10-CM

## 2023-03-17 NOTE — ED Triage Notes (Signed)
 Injury occurred around 2-3 pm.

## 2023-03-17 NOTE — ED Notes (Signed)
 Checked at triage.  Coban os already secure prior to arrival.  There is a blood stain on dressing.  Able to move fingers, sensation intact.  Patient reports the wind blew his gate latch against right arm.  This occurred yesterday.

## 2023-03-17 NOTE — ED Provider Notes (Signed)
 [facility]    CSN: [id number] Arrival date & time: 03/17/23  1011      History   Chief Complaint Chief Complaint  Patient presents with   Arm Injury    HPI Roy [name] is a 89 y.o. male.   89 year old male who presents to urgent care with complaints of a left arm laceration.  This occurred at around 2:00 yesterday when he was out letting his dogs out.  He reports that the latch on his gate hit his arm.  He did not realize he had done it until he went inside and saw that his arm was bleeding.  He is wife placed a pressure bandage over the area but due to it continuing to bleed he presented to urgent care today for evaluation.  He reports that his tetanus is up-to-date.  He has had many lacerations in the past.  He denies any fevers or chills.  He reports that they clean the area significantly yesterday.   Arm Injury Associated symptoms: no back pain and no fever     Past Medical History:  Diagnosis Date   Anginal pain (HCC)    Arthritis    spine   Asthma    CAD (coronary artery disease)    s/p PCI of the D1 with cath in 2013 showing nonobstructive disease.  CP presumed due to small vessel disease.   CAP (community acquired pneumonia) 12/26/2011   Chronic kidney disease    renal insufficiency   COPD (chronic obstructive pulmonary disease) (HCC)    Diabetes mellitus, type 2 (HCC)    Fever 12/26/2011   GERD (gastroesophageal reflux disease)    Heart murmur    Hyperlipidemia    Hypertension    Lyme disease    states had shakes that were thought to be parkinson's related but related to tick bite, states also RMSF   Myocardial infarction [facility]    Right rotator cuff tear    Shortness of breath    Sleep apnea    intolerant to PAP    Patient Active Problem List   Diagnosis Date Noted   Right rotator cuff tear 12/11/2018   Calcific tendinitis of right shoulder 09/03/2018   AC (acromioclavicular) arthritis 09/03/2018   Pain of right thumb 04/08/2018   Food  impaction of esophagus 04/03/2018   GERD (gastroesophageal reflux disease) 05/03/2014   Insomnia 05/03/2014   Allergic rhinitis 04/21/2014   Giardia 04/21/2014   Mild persistent asthma 10/30/2010   Stage 3b chronic kidney disease (HCC) 02/22/2009   NEPHROLITHIASIS, RECURRENT 01/21/2009   ACTINIC KERATOSIS, HEAD 06/14/2008   Type II diabetes mellitus with renal manifestations (HCC) 09/11/2006   Hyperlipidemia associated with type 2 diabetes mellitus (HCC) 09/11/2006   Essential hypertension 09/11/2006   Coronary artery disease with angina pectoris (HCC) 09/11/2006   Back pain 09/11/2006   Sleep apnea 09/11/2006    Past Surgical History:  Procedure Laterality Date   APPENDECTOMY     CARDIAC CATHETERIZATION  2013   CERVICAL LAMINECTOMY     CHOLECYSTECTOMY     ESOPHAGOGASTRODUODENOSCOPY N/A 04/04/2018   Procedure: ESOPHAGOGASTRODUODENOSCOPY (EGD);  Surgeon: Unk Corinn Skiff, MD;  Location: [facility] ENDOSCOPY;  Service: Gastroenterology;  Laterality: N/A;   KIDNEY STONE SURGERY     LEFT HEART CATH N/A 02/15/2012   Procedure: LEFT HEART CATH;  Surgeon: Ezra GORMAN Shuck, MD;  Location: [facility] CATH LAB;  Service: Cardiovascular;  Laterality: N/A;   SHOULDER ARTHROSCOPY WITH ROTATOR CUFF REPAIR AND SUBACROMIAL DECOMPRESSION Right 01/20/2019  Procedure: SHOULDER ARTHROSCOPY WITH ROTATOR CUFF REPAIR AND SUBACROMIAL DECOMPRESSION, BICEPS TENOTOMY;  Surgeon: Dozier Soulier, MD;  Location: [facility];  Service: Orthopedics;  Laterality: Right;       Home Medications    Prior to Admission medications   Medication Sig Start Date End Date Taking? Authorizing Provider  acetaminophen  (TYLENOL ) 325 MG tablet Take 650 mg by mouth every evening. Reported on 06/28/2015    [provider]  albuterol  (VENTOLIN  HFA) 108 (90 Base) MCG/ACT inhaler Inhale 2 puffs into the lungs every 6 (six) hours as needed for wheezing or shortness of breath. 01/10/21   Katrinka Garnette KIDD, MD   amLODipine  (NORVASC ) 2.5 MG tablet Take 1 tablet by mouth once daily 02/05/23   Shlomo Wilbert SAUNDERS, MD  aspirin  EC 81 MG tablet Take 1 tablet (81 mg total) by mouth daily. 03/11/12   Rolan Ezra RAMAN, MD  ASSURE COMFORT LANCETS 30G MISC 1 each by Other route daily. 12/02/13   [provider]  atorvastatin  (LIPITOR) 20 MG tablet Take 1 tablet by mouth once daily 01/21/23   Katrinka Garnette KIDD, MD  Blood Glucose Monitoring Suppl DEVI Use to test blood sugars daily. Dx: E11.9 11/19/22   Katrinka Garnette KIDD, MD  budesonide -formoterol  (SYMBICORT ) 80-4.5 MCG/ACT inhaler Inhale 2 puffs into the lungs 2 (two) times daily. 11/16/22   Katrinka Garnette KIDD, MD  calcium -vitamin D (OSCAL WITH D) 500-200 MG-UNIT per tablet Take 1 tablet by mouth daily.    [provider]  empagliflozin  (JARDIANCE ) 10 MG TABS tablet Take 1 tablet (10 mg total) by mouth daily before breakfast. 11/21/22   Katrinka Garnette KIDD, MD  glipiZIDE  2.5 MG TABS Take 1 tablet by mouth daily before breakfast. 06/25/22   Katrinka Garnette KIDD, MD  Glucose Blood (BLOOD GLUCOSE TEST STRIPS) STRP Use to test blood sugars daily. Dx: E11.9 11/19/22   Katrinka Garnette KIDD, MD  guaiFENesin  (MUCINEX ) 600 MG 12 hr tablet Take by mouth 2 (two) times daily.    [provider]  isosorbide  mononitrate (IMDUR ) 30 MG 24 hr tablet Take 3 tablets (90 mg total) by mouth daily. 06/11/22   Shlomo Wilbert SAUNDERS, MD  Lancets Misc. MISC Use to test blood sugars daily. Dx: E11.9 11/19/22   Katrinka Garnette KIDD, MD  latanoprost (XALATAN) 0.005 % ophthalmic solution INSTILL 1 DROP INTO EACH EYE NIGHTLY 12/22/19   [provider]  lisinopril  (ZESTRIL ) 5 MG tablet Take 0.5 tablets (2.5 mg total) by mouth daily. 05/02/22   Shlomo Wilbert SAUNDERS, MD  metoprolol  succinate (TOPROL -XL) 50 MG 24 hr tablet TAKE 1 TABLET BY MOUTH ONCE DAILY (TAKE  WITH  OR  IMMEDIATELY  FOLLOWING  A  MEAL) 01/14/23   Conte, Orren SAILOR, PA-C  nitroGLYCERIN  (NITROSTAT ) 0.4 MG SL tablet DISSOLVE ONE TABLET UNDER  THE TONGUE EVERY FIVE MINUTES AS NEEDED FOR CHEST PAIN 07/13/22   Katrinka Garnette KIDD, MD  Omega-3 Fatty Acids (FISH OIL PO) Take 2 capsules by mouth 2 (two) times daily.    [provider]  omeprazole  (PRILOSEC) 20 MG capsule Take 1 capsule by mouth twice daily 03/11/23   Katrinka Garnette KIDD, MD  Polyethyl Glycol-Propyl Glycol (SYSTANE OP) Place 1 drop into both eyes 4 (four) times daily. Itchy/dry eye    [provider]  potassium citrate  (UROCIT-K ) 10 MEQ (1080 MG) SR tablet Take 10 mEq by mouth Twice daily. 01/29/12   [provider]  PROAIR  HFA 108 (90 Base) MCG/ACT inhaler INHALE 2 PUFFS BY MOUTH  EVERY 4 HOURS AS NEEDED FOR WHEEZING OR  SHORTNESS  OF  BREATH 01/09/21   Katrinka Garnette KIDD, MD  ranolazine  (RANEXA ) 500 MG 12 hr tablet TAKE 1 TABLET BY MOUTH TWICE DAILY 01/14/23   Lucien Orren SAILOR, PA-C  SPIKEVAX syringe Inject 0.5 mLs into the muscle once. 11/06/22   [provider]  traMADol  (ULTRAM ) 50 MG tablet Take 1 tablet twice a day as needed 11/19/22   Katrinka Garnette KIDD, MD  traZODone  (DESYREL ) 150 MG tablet Take 1 tablet by mouth once daily 01/07/23   Katrinka Garnette KIDD, MD  OLLIE 720-20 ELU-MCG/ML injection Inject 1 mL into the muscle once. 09/13/22   [provider]  zinc  sulfate 220 MG capsule Take 220 mg by mouth daily.    [provider]    Family History Family History  Problem Relation Age of Onset   ALS Father    Asthma Daughter    Cancer Sister        breast/liver   Cancer Brother        bone cancer    Social History Social History   Tobacco Use   Smoking status: Former    Current packs/day: 0.00    Average packs/day: 1.5 packs/day for 8.0 years (12.0 ttl pk-yrs)    Types: Cigarettes    Start date: 03/05/1950    Quit date: 03/05/1958    Years since quitting: 65.0   Smokeless tobacco: Never  Vaping Use   Vaping status: Never Used  Substance Use Topics   Alcohol  use: No    Alcohol /week: 0.0 standard drinks of alcohol     Drug use: No     Allergies   Codeine sulfate, Crestor [rosuvastatin calcium ], and Cephalexin   Review of Systems Review of Systems  Constitutional:  Negative for chills and fever.  HENT:  Negative for ear pain and sore throat.   Eyes:  Negative for pain and visual disturbance.  Respiratory:  Negative for cough and shortness of breath.   Cardiovascular:  Negative for chest pain and palpitations.  Gastrointestinal:  Negative for abdominal pain and vomiting.  Genitourinary:  Negative for dysuria and hematuria.  Musculoskeletal:  Negative for arthralgias and back pain.  Skin:  Positive for wound. Negative for color change and rash.  Neurological:  Negative for seizures and syncope.  All other systems reviewed and are negative.    Physical Exam Triage Vital Signs ED Triage Vitals  Encounter Vitals Group     BP 03/17/23 1030 118/69     Systolic BP Percentile --      Diastolic BP Percentile --      Pulse Rate 03/17/23 1030 77     Resp 03/17/23 1030 20     Temp 03/17/23 1030 97.7 F (36.5 C)     Temp Source 03/17/23 1030 Oral     SpO2 03/17/23 1030 94 %     Weight --      Height --      Head Circumference --      Peak Flow --      Pain Score 03/17/23 1028 0     Pain Loc --      Pain Education --      Exclude from Growth Chart --    No data found.  Updated Vital Signs BP 118/69 (BP Location: Left Arm)   Pulse 77   Temp 97.7 F (36.5 C) (Oral)   Resp 20   SpO2 94%   Visual Acuity Right Eye Distance:  Left Eye Distance:   Bilateral Distance:    Right Eye Near:   Left Eye Near:    Bilateral Near:     Physical Exam Vitals and nursing note reviewed.  Constitutional:      General: He is not in acute distress.    Appearance: He is well-developed.  HENT:     Head: Normocephalic and atraumatic.  Eyes:     Conjunctiva/sclera: Conjunctivae normal.  Cardiovascular:     Rate and Rhythm: Normal rate and regular rhythm.     Heart sounds: No murmur  heard. Pulmonary:     Effort: Pulmonary effort is normal. No respiratory distress.     Breath sounds: Normal breath sounds.  Abdominal:     Palpations: Abdomen is soft.     Tenderness: There is no abdominal tenderness.  Musculoskeletal:        General: No swelling.     Cervical back: Neck supple.  Skin:    General: Skin is warm and dry.     Capillary Refill: Capillary refill takes less than 2 seconds.     Comments: Left lateral forearm with crescent shaped laceration limited to full-thickness skin.  The skin flap is retracted and unable to be reapproximated.  The skin flap is viable.  There is moderate venous bleeding from the area and silver  nitrate is applied, 5 minutes of pressure was applied and bleeding was stopped.  Neurological:     Mental Status: He is alert.  Psychiatric:        Mood and Affect: Mood normal.      UC Treatments / Results  Labs (all labs ordered are listed, but only abnormal results are displayed) Labs Reviewed - No data to display  EKG   Radiology No results found.  Procedures Procedures (including critical care time)  Medications Ordered in UC Medications - No data to display  Initial Impression / Assessment and Plan / UC Course  I have reviewed the triage vital signs and the nursing notes.  Pertinent labs & imaging results that were available during my care of the patient were reviewed by me and considered in my medical decision making (see chart for details).     Laceration of left forearm, initial encounter   Crescent shaped laceration of the left lateral forearm that is limited to the full-thickness skin. The skin flap is retracted and unable to be re-approximated due to the duration of time since the injury.  The flap is viable.  The bleeding has stopped using pressure and silver  nitrate but need to keep the dressing that is applied today until tomorrow morning.  Start dressings tomorrow, Monday 03/18/23, Apply gentivan violet (blue  liquid) to the wound daily and then apply a non-stick dressing. Wrap this with a roll gauze and coban. Change the dressing daily.  Ok to shower with the bandage off.  Continue dressings for at least 7 days and then if area is healed, may stop dressings.  Return to urgent care or PCP if symptoms worsen or fail to resolve.    Final Clinical Impressions(s) / UC Diagnoses   Final diagnoses:  Laceration of left forearm, initial encounter     Discharge Instructions      Laceration of the left lateral forearm. The skin flap is retracted and unable to be re-approximated due to the duration of time since the injury. The bleeding has stopped but need to keep the dressing that is applied today until tomorrow morning.  Start dressings tomorrow, Monday 03/18/23, Apply  gentivan violet (blue liquid) to the wound daily and then apply a non-stick dressing. Wrap this with a roll gauze and coban. Change the dressing daily.  Ok to shower with the bandage off.  Continue dressings for at least 7 days and then if area is healed, may stop dressings.  Return to urgent care or PCP if symptoms worsen or fail to resolve.     ED Prescriptions   None    PDMP not reviewed this encounter.   Teresa Almarie LABOR, PA-C 03/17/23 1057

## 2023-03-17 NOTE — Discharge Instructions (Addendum)
 Laceration of the left lateral forearm. The skin flap is retracted and unable to be re-approximated due to the duration of time since the injury. The bleeding has stopped but need to keep the dressing that is applied today until tomorrow morning.  Start dressings tomorrow, Monday 03/18/23, Apply gentivan violet (blue liquid) to the wound daily and then apply a non-stick dressing. Wrap this with a roll gauze and coban. Change the dressing daily.  Ok to shower with the bandage off.  Continue dressings for at least 7 days and then if area is healed, may stop dressings.  Return to urgent care or PCP if symptoms worsen or fail to resolve.

## 2023-03-18 ENCOUNTER — Ambulatory Visit (INDEPENDENT_AMBULATORY_CARE_PROVIDER_SITE_OTHER): Payer: PPO | Admitting: Family Medicine

## 2023-03-18 ENCOUNTER — Encounter: Payer: Self-pay | Admitting: Family Medicine

## 2023-03-18 VITALS — BP 120/62 | HR 80 | Temp 99.8°F | Ht 69.5 in | Wt 173.2 lb

## 2023-03-18 DIAGNOSIS — E1121 Type 2 diabetes mellitus with diabetic nephropathy: Secondary | ICD-10-CM

## 2023-03-18 DIAGNOSIS — Z7984 Long term (current) use of oral hypoglycemic drugs: Secondary | ICD-10-CM

## 2023-03-18 DIAGNOSIS — I10 Essential (primary) hypertension: Secondary | ICD-10-CM

## 2023-03-18 DIAGNOSIS — I25119 Atherosclerotic heart disease of native coronary artery with unspecified angina pectoris: Secondary | ICD-10-CM

## 2023-03-18 DIAGNOSIS — N1832 Chronic kidney disease, stage 3b: Secondary | ICD-10-CM

## 2023-03-18 DIAGNOSIS — E1169 Type 2 diabetes mellitus with other specified complication: Secondary | ICD-10-CM | POA: Diagnosis not present

## 2023-03-18 DIAGNOSIS — E785 Hyperlipidemia, unspecified: Secondary | ICD-10-CM

## 2023-03-18 LAB — COMPREHENSIVE METABOLIC PANEL
ALT: 11 U/L (ref 0–53)
AST: 11 U/L (ref 0–37)
Albumin: 4.4 g/dL (ref 3.5–5.2)
Alkaline Phosphatase: 80 U/L (ref 39–117)
BUN: 51 mg/dL — ABNORMAL HIGH (ref 6–23)
CO2: 23 meq/L (ref 19–32)
Calcium: 9.2 mg/dL (ref 8.4–10.5)
Chloride: 105 meq/L (ref 96–112)
Creatinine, Ser: 2.52 mg/dL — ABNORMAL HIGH (ref 0.40–1.50)
GFR: 22.19 mL/min — ABNORMAL LOW (ref >60.00)
Glucose, Bld: 206 mg/dL — ABNORMAL HIGH (ref 70–99)
Potassium: 4.1 meq/L (ref 3.5–5.1)
Sodium: 139 meq/L (ref 135–145)
Total Bilirubin: 0.6 mg/dL (ref 0.2–1.2)
Total Protein: 6.6 g/dL (ref 6.0–8.3)

## 2023-03-18 LAB — CBC WITH DIFFERENTIAL/PLATELET
Basophils Absolute: 0.1 10*3/uL (ref 0.0–0.1)
Basophils Relative: 0.8 % (ref 0.0–3.0)
Eosinophils Absolute: 1.2 10*3/uL — ABNORMAL HIGH (ref 0.0–0.7)
Eosinophils Relative: 12.9 % — ABNORMAL HIGH (ref 0.0–5.0)
HCT: 39.5 % (ref 39.0–52.0)
Hemoglobin: 13.1 g/dL (ref 13.0–17.0)
Lymphocytes Relative: 16.9 % (ref 12.0–46.0)
Lymphs Abs: 1.6 10*3/uL (ref 0.7–4.0)
MCHC: 33.1 g/dL (ref 30.0–36.0)
MCV: 88 fL (ref 78.0–100.0)
Monocytes Absolute: 0.7 10*3/uL (ref 0.1–1.0)
Monocytes Relative: 7 % (ref 3.0–12.0)
Neutro Abs: 6 10*3/uL (ref 1.4–7.7)
Neutrophils Relative %: 62.4 % (ref 43.0–77.0)
Platelets: 136 10*3/uL — ABNORMAL LOW (ref 150.0–400.0)
RBC: 4.49 Mil/uL (ref 4.22–5.81)
RDW: 15.2 % (ref 11.5–15.5)
WBC: 9.6 10*3/uL (ref 4.0–10.5)

## 2023-03-18 LAB — MICROALBUMIN / CREATININE URINE RATIO
Creatinine,U: 67.8 mg/dL
Microalb Creat Ratio: 7.9 mg/g (ref 0.0–30.0)
Microalb, Ur: 5.4 mg/dL — ABNORMAL HIGH (ref 0.0–1.9)

## 2023-03-18 LAB — HEMOGLOBIN A1C: Hgb A1c MFr Bld: 7.6 % — ABNORMAL HIGH (ref 4.6–6.5)

## 2023-03-18 MED ORDER — ALBUTEROL SULFATE HFA 108 (90 BASE) MCG/ACT IN AERS: 2.0000 | INHALATION_SPRAY | Freq: Four times a day (QID) | RESPIRATORY_TRACT | 2 refills | Status: AC | PRN

## 2023-03-18 NOTE — Patient Instructions (Addendum)
 Have a copy of your eye exam sent to us  when you go to day.  Please stop by lab before you go If you have mychart- we will send your results within 3 business days of us  receiving them.  If you do not have mychart- we will call you about results within 5 business days of us  receiving them.  *please also note that you will see labs on mychart as soon as they post. I will later go in and write notes on them- will say notes from Dr. Katrinka    Recommended follow up: Return in about 4 months (around 07/16/2023) for followup or sooner if needed.Schedule b4 you leave.

## 2023-03-18 NOTE — Progress Notes (Signed)
 Phone [phone] In person visit   Subjective:   Roy [name] is a 89 y.o. 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) 09/11/2006    Priority: High   Mild persistent asthma 10/30/2010    Priority: Medium    Stage 3b chronic kidney disease (HCC) 02/22/2009    Priority: Medium    Hyperlipidemia associated with type 2 diabetes mellitus (HCC) 09/11/2006    Priority: Medium    Essential hypertension 09/11/2006    Priority: Medium    Back pain 09/11/2006    Priority: Medium    Sleep apnea 09/11/2006    Priority: Medium    Food impaction of esophagus 04/03/2018    Priority: Low   GERD (gastroesophageal reflux disease) 05/03/2014    Priority: Low   Insomnia 05/03/2014    Priority: Low   Allergic rhinitis 04/21/2014    Priority: Low   Giardia 04/21/2014    Priority: Low   NEPHROLITHIASIS, RECURRENT 01/21/2009    Priority: Low   ACTINIC KERATOSIS, HEAD 06/14/2008    Priority: Low   Right rotator cuff tear 12/11/2018    Priority: 1.   Calcific tendinitis of right shoulder 09/03/2018    Priority: 1.   AC (acromioclavicular) arthritis 09/03/2018    Priority: 1.   Pain of right thumb 04/08/2018    Priority: 1.    Medications- reviewed and updated Current Outpatient Medications  Medication Sig Dispense Refill   acetaminophen  (TYLENOL ) 325 MG tablet Take 650 mg by mouth every evening. Reported on 06/28/2015     amLODipine  (NORVASC ) 2.5 MG tablet Take 1 tablet by mouth once daily 90 tablet 3   aspirin  EC 81 MG tablet Take 1 tablet (81 mg total) by mouth daily.     ASSURE COMFORT LANCETS 30G MISC 1 each by  Other route daily.     atorvastatin  (LIPITOR) 20 MG tablet Take 1 tablet by mouth once daily 90 tablet 0   Blood Glucose Monitoring Suppl DEVI Use to test blood sugars daily. Dx: E11.9 1 each 3   budesonide -formoterol  (SYMBICORT ) 80-4.5 MCG/ACT inhaler Inhale 2 puffs into the lungs 2 (two) times daily. 1 each 11   calcium -vitamin D (OSCAL WITH D) 500-200 MG-UNIT per tablet Take 1 tablet by mouth daily.     empagliflozin  (JARDIANCE ) 10 MG TABS tablet Take 1 tablet (10 mg total) by mouth daily before breakfast. 30 tablet 11   glipiZIDE  2.5 MG TABS Take 1 tablet by mouth daily before breakfast. 90 tablet 3   Glucose Blood (BLOOD GLUCOSE TEST STRIPS) STRP Use to test blood sugars daily. Dx: E11.9 100 strip 11   guaiFENesin  (MUCINEX ) 600 MG 12 hr tablet Take by mouth 2 (two) times daily.     isosorbide  mononitrate (IMDUR ) 30 MG 24 hr tablet Take 3 tablets (90 mg total) by mouth daily. 270 tablet 2   Lancets Misc. MISC Use to test blood sugars daily. Dx: E11.9 100 each 0   latanoprost (XALATAN) 0.005 % ophthalmic solution INSTILL 1 DROP INTO EACH EYE NIGHTLY     lisinopril  (ZESTRIL ) 5 MG tablet Take 0.5 tablets (2.5 mg total) by mouth  daily. 45 tablet 3   metoprolol  succinate (TOPROL -XL) 50 MG 24 hr tablet TAKE 1 TABLET BY MOUTH ONCE DAILY (TAKE  WITH  OR  IMMEDIATELY  FOLLOWING  A  MEAL) 90 tablet 0   nitroGLYCERIN  (NITROSTAT ) 0.4 MG SL tablet DISSOLVE ONE TABLET UNDER THE TONGUE EVERY FIVE MINUTES AS NEEDED FOR CHEST PAIN 25 tablet 1   Omega-3 Fatty Acids (FISH OIL PO) Take 2 capsules by mouth 2 (two) times daily.     omeprazole  (PRILOSEC) 20 MG capsule Take 1 capsule by mouth twice daily 180 capsule 0   Polyethyl Glycol-Propyl Glycol (SYSTANE OP) Place 1 drop into both eyes 4 (four) times daily. Itchy/dry eye     potassium citrate  (UROCIT-K ) 10 MEQ (1080 MG) SR tablet Take 10 mEq by mouth Twice daily.     ranolazine  (RANEXA ) 500 MG 12 hr tablet TAKE 1 TABLET BY MOUTH TWICE DAILY 180 tablet 3    SPIKEVAX syringe Inject 0.5 mLs into the muscle once.     traMADol  (ULTRAM ) 50 MG tablet Take 1 tablet twice a day as needed 60 tablet 2   traZODone  (DESYREL ) 150 MG tablet Take 1 tablet by mouth once daily 90 tablet 0   zinc  sulfate 220 MG capsule Take 220 mg by mouth daily.     albuterol  (VENTOLIN  HFA) 108 (90 Base) MCG/ACT inhaler Inhale 2 puffs into the lungs every 6 (six) hours as needed for wheezing or shortness of breath. 1 each 2   No current facility-administered medications for this visit.     Objective:  BP 120/62   Pulse 80   Temp 99.8 F (37.7 C)   Ht 5' 9.5 (1.765 m)   Wt 173 lb 3.2 oz (78.6 kg)   SpO2 96%   BMI 25.21 kg/m  Gen: NAD, resting comfortably CV: RRR no murmurs rubs or gallops Lungs: CTAB no crackles, wheeze, rhonchi Ext: no edema Skin: warm, dry    Assessment and Plan   #Laceration- seen in urgent care- essentially had to dress this - they had to use silver  nitrate and pressure to keep it from oozing- he is going of the change dressings daily- its wrapped today -may have some anemia from that blood loss- check today  # Diabetes S:compliant with glipizide  2.5 mg daily, restart jardiance  10 mg with microalbuminuria 11/2022 - sugars doesn't check much in morning. Post meal can be up closer to 200 Lab Results  Component Value Date   HGBA1C 7.5 (H) 11/16/2022   HGBA1C 7.0 (A) 06/25/2022   HGBA1C 9.0 (H) 02/19/2022  A/P: hopefully improved- update a1c today. Continue current meds for now    # CAD with angina/Hyperlipidemia- sees Dr. Shlomo S:compliant with atorvastatin  20mg  and aspirin  81mg . LDL goal <70 .asymptomatic  on ranexa  500 twice daily and imdur  90 mg- no chest pain or shortness of breath - no nitroglycerin  use in years Lab Results  Component Value Date   CHOL 113 11/16/2022   HDL 42.80 11/16/2022   LDLCALC 47 11/16/2022   LDLDIRECT 55.0 12/30/2018   TRIG 117.0 11/16/2022   CHOLHDL 3 11/16/2022  A/P: coronary artery disease asymptomatic  continue current medications  Cholesterol at goal- continue current medications    # Hypertension/CKD III--> IV S:compliant with  Amlodipine  2.5, imdur  90mg , lisinopril  2.5 mg, metoprolol  50mg  XR -on lisinopril  in case proteinuric element. GFR just below 30 lately- does not see nephrology BP Readings from Last 3 Encounters:  03/18/23 120/62  03/17/23 118/69  01/14/23 128/60  A/P: hypertension  stable- continue current medicines  CKD IV hopefully stable- update cmp today. Continue current meds for now    # Asthma S: compliant with Symbicort  80-4.5 mg 2 puffs twice daily. uses albuterol  as needed- not needing.  A/P:doing well- offered to change to Symbicort  only for rescue as well- he wants to hold off- refill albuterol   #history of kidney stones- remains on potassium citrate  through urology    #chronic pain for hand pain (also sees [facility])- tramadol  takes a mild edge off of pain- willing to refill - he uses sparingly  #insomnia- trazodone  helping for sleep - sleeping well  #Anemia and platelet issues in the past-hematology 01/13/2019 said no further work-up and released patient to as needed-overall largely stable- but may be slightly worse    Recommended follow up: Return in about 4 months (around 07/16/2023) for followup or sooner if needed.Schedule b4 you leave. Future Appointments  Date Time Provider Department Center  07/16/2023 10:30 AM [facility] ANNUAL WELLNESS VISIT 1 [facility]    Lab/Order associations:   ICD-10-CM   1. Coronary artery disease involving native coronary artery of native [name] with angina pectoris (HCC)  I25.119     2. Type 2 diabetes mellitus with diabetic nephropathy, without long-term current use of insulin  (HCC)  E11.21 Comprehensive metabolic panel    Hemoglobin A1c    Microalbumin / creatinine urine ratio    CBC with Differential/Platelet    3. Essential hypertension  I10     4. Hyperlipidemia associated with type 2 diabetes mellitus (HCC)   E11.69 Comprehensive metabolic panel   Z21.4 CBC with Differential/Platelet    5. Stage 3b chronic kidney disease (HCC)  N18.32       Meds ordered this encounter  Medications   albuterol  (VENTOLIN  HFA) 108 (90 Base) MCG/ACT inhaler    Sig: Inhale 2 puffs into the lungs every 6 (six) hours as needed for wheezing or shortness of breath.    Dispense:  1 each    Refill:  2    Return precautions advised.  Garnette Lukes, MD

## 2023-03-19 ENCOUNTER — Other Ambulatory Visit: Payer: Self-pay

## 2023-03-19 ENCOUNTER — Telehealth: Payer: Self-pay

## 2023-03-19 MED ORDER — EMPAGLIFLOZIN 25 MG PO TABS: 25.0000 mg | ORAL_TABLET | Freq: Every day | ORAL | 11 refills | Status: DC

## 2023-03-19 NOTE — Telephone Encounter (Signed)
 Yes thanks-he can take 2 of the 10 mg until he runs out

## 2023-03-19 NOTE — Telephone Encounter (Signed)
 See below.  Copied from CRM [id number]. Topic: Clinical - Medical Advice >> Mar 19, 2023  3:40 PM Joen B wrote: Reason for CRM: PATIENT WAS ON JARDIANCE  10MG  PER DAY. HIS DOCTOR INCREASED IT TO 25MG  PER. THE PHARMACY WANTS TO KNOW CAN HE TAKE 2 OF THE 10MG  UNTIL THEY ARE GONE BECAUSE HE HAD JUST REFILLED THE MEDICATION. PLEASE CONTACT THE PATIENT

## 2023-03-20 NOTE — Telephone Encounter (Signed)
 Callled and lm for pt tcb.

## 2023-03-21 NOTE — Telephone Encounter (Signed)
 Called and lm on pt vm with below message.

## 2023-04-16 ENCOUNTER — Other Ambulatory Visit: Payer: Self-pay | Admitting: Cardiology

## 2023-06-07 ENCOUNTER — Other Ambulatory Visit: Payer: Self-pay | Admitting: Cardiology

## 2023-06-21 ENCOUNTER — Other Ambulatory Visit: Payer: Self-pay | Admitting: Family Medicine

## 2023-07-03 ENCOUNTER — Other Ambulatory Visit: Payer: Self-pay | Admitting: Family Medicine

## 2023-07-13 ENCOUNTER — Other Ambulatory Visit: Payer: Self-pay | Admitting: Family Medicine

## 2023-07-16 ENCOUNTER — Ambulatory Visit (INDEPENDENT_AMBULATORY_CARE_PROVIDER_SITE_OTHER): Payer: PPO

## 2023-07-16 VITALS — Ht 69.5 in | Wt 173.0 lb

## 2023-07-16 DIAGNOSIS — Z0000 Encounter for general adult medical examination without abnormal findings: Secondary | ICD-10-CM

## 2023-07-16 NOTE — Progress Notes (Signed)
 Subjective:   Roy [name] is a 89 y.o. who presents for a Medicare Wellness preventive visit.  As a reminder, Annual Wellness Visits don't include a physical exam, and some assessments may be limited, especially if this visit is performed virtually. We may recommend an in-person visit if needed.  Visit Complete: Virtual I connected with  Roy [name] on 07/16/23 by a audio enabled telemedicine application and verified that I am speaking with the correct person using two identifiers.  Patient Location: Home  Provider Location: Office/Clinic  I discussed the limitations of evaluation and management by telemedicine. The patient expressed understanding and agreed to proceed.  Vital Signs: Because this visit was a virtual/telehealth visit, some criteria may be missing or patient reported. Any vitals not documented were not able to be obtained and vitals that have been documented are patient reported.  VideoDeclined- This patient declined Librarian, academic. Therefore the visit was completed with audio only.  Persons Participating in Visit: Patient assisted by Wife .  AWV Questionnaire: No: Patient Medicare AWV questionnaire was not completed prior to this visit.  Cardiac Risk Factors include: advanced age (>23men, >55 women);dyslipidemia;hypertension;diabetes mellitus     Objective:     Today's Vitals   07/16/23 1016 07/16/23 1017  Weight: 173 lb (78.5 kg)   Height: 5' 9.5" (1.765 m)   PainSc:  6    Body mass index is 25.18 kg/m.     07/16/2023   10:23 AM 07/12/2022   11:01 AM 06/27/2021   10:49 AM 12/28/2019    2:52 PM 01/20/2019    6:34 AM 01/14/2019   11:03 AM 11/27/2018   12:35 PM  Advanced Directives  Does Patient Have a Medical Advance Directive? No No No No No No No  Would patient like information on creating a medical advance directive? No - Patient declined No - Patient declined No - Patient declined No - Patient declined No - Patient  declined No - Patient declined Yes (MAU/Ambulatory/Procedural Areas - Information given)    Current Medications (verified) Outpatient Encounter Medications as of 07/16/2023  Medication Sig   acetaminophen  (TYLENOL ) 325 MG tablet Take 650 mg by mouth every evening. Reported on 06/28/2015   albuterol  (VENTOLIN  HFA) 108 (90 Base) MCG/ACT inhaler Inhale 2 puffs into the lungs every 6 (six) hours as needed for wheezing or shortness of breath.   amLODipine  (NORVASC ) 2.5 MG tablet Take 1 tablet by mouth once daily   aspirin  EC 81 MG tablet Take 1 tablet (81 mg total) by mouth daily.   ASSURE COMFORT LANCETS 30G MISC 1 each by Other route daily.   atorvastatin  (LIPITOR) 20 MG tablet Take 1 tablet by mouth once daily   Blood Glucose Monitoring Suppl DEVI Use to test blood sugars daily. Dx: E11.9   brimonidine (ALPHAGAN) 0.2 % ophthalmic solution Apply 1 drop to eye.   budesonide -formoterol  (SYMBICORT ) 80-4.5 MCG/ACT inhaler Inhale 2 puffs into the lungs 2 (two) times daily.   calcium -vitamin D (OSCAL WITH D) 500-200 MG-UNIT per tablet Take 1 tablet by mouth daily.   empagliflozin  (JARDIANCE ) 25 MG TABS tablet Take 1 tablet (25 mg total) by mouth daily before breakfast.   glipiZIDE  2.5 MG TABS Take 1 tablet by mouth daily before breakfast.   Glucose Blood (BLOOD GLUCOSE TEST STRIPS) STRP Use to test blood sugars daily. Dx: E11.9   guaiFENesin  (MUCINEX ) 600 MG 12 hr tablet Take by mouth 2 (two) times daily.   isosorbide  mononitrate (IMDUR ) 30 MG 24  hr tablet Take 3 tablets by mouth once daily   Lancets Misc. MISC Use to test blood sugars daily. Dx: E11.9   latanoprost (XALATAN) 0.005 % ophthalmic solution INSTILL 1 DROP INTO EACH EYE NIGHTLY   lisinopril  (ZESTRIL ) 5 MG tablet Take 1/2 (one-half) tablet by mouth once daily   metoprolol  succinate (TOPROL -XL) 50 MG 24 hr tablet TAKE 1 TABLET BY MOUTH ONCE DAILY (TAKE  WITH  OR  IMMEDIATELY  FOLLOWING  A  MEAL)   nitroGLYCERIN  (NITROSTAT ) 0.4 MG SL tablet  DISSOLVE ONE TABLET UNDER THE TONGUE EVERY FIVE MINUTES AS NEEDED FOR CHEST PAIN   Omega-3 Fatty Acids (FISH OIL PO) Take 2 capsules by mouth 2 (two) times daily.   omeprazole  (PRILOSEC) 20 MG capsule Take 1 capsule by mouth twice daily   Polyethyl Glycol-Propyl Glycol (SYSTANE OP) Place 1 drop into both eyes 4 (four) times daily. Itchy/dry eye   potassium citrate  (UROCIT-K ) 10 MEQ (1080 MG) SR tablet Take 10 mEq by mouth Twice daily.   ranolazine  (RANEXA ) 500 MG 12 hr tablet TAKE 1 TABLET BY MOUTH TWICE DAILY   traMADol  (ULTRAM ) 50 MG tablet Take 1 tablet twice a day as needed   traZODone  (DESYREL ) 150 MG tablet Take 1 tablet by mouth once daily   zinc  sulfate 220 MG capsule Take 220 mg by mouth daily.   SPIKEVAX syringe Inject 0.5 mLs into the muscle once.   No facility-administered encounter medications on file as of 07/16/2023.    Allergies (verified) Codeine sulfate, Crestor [rosuvastatin calcium ], and Cephalexin   History: Past Medical History:  Diagnosis Date   Anginal pain (HCC)    Arthritis    spine   Asthma    CAD (coronary artery disease)    s/p PCI of the D1 with cath in 2013 showing nonobstructive disease.  CP presumed due to small vessel disease.   CAP (community acquired pneumonia) 12/26/2011   Chronic kidney disease    renal insufficiency   COPD (chronic obstructive pulmonary disease) (HCC)    Diabetes mellitus, type 2 (HCC)    Fever 12/26/2011   GERD (gastroesophageal reflux disease)    Heart murmur    Hyperlipidemia    Hypertension    Lyme disease    states had shakes that were thought to be parkinson's related but related to tick bite, states also RMSF   Myocardial infarction [facility])    Right rotator cuff tear    Shortness of breath    Sleep apnea    intolerant to PAP   Past Surgical History:  Procedure Laterality Date   APPENDECTOMY     CARDIAC CATHETERIZATION  2013   CERVICAL LAMINECTOMY     CHOLECYSTECTOMY     ESOPHAGOGASTRODUODENOSCOPY N/A  04/04/2018   Procedure: ESOPHAGOGASTRODUODENOSCOPY (EGD);  Surgeon: Selena Daily, MD;  Location: [facility] ENDOSCOPY;  Service: Gastroenterology;  Laterality: N/A;   KIDNEY STONE SURGERY     LEFT HEART CATH N/A 02/15/2012   Procedure: LEFT HEART CATH;  Surgeon: Darlis Eisenmenger, MD;  Location: [facility] CATH LAB;  Service: Cardiovascular;  Laterality: N/A;   SHOULDER ARTHROSCOPY WITH ROTATOR CUFF REPAIR AND SUBACROMIAL DECOMPRESSION Right 01/20/2019   Procedure: SHOULDER ARTHROSCOPY WITH ROTATOR CUFF REPAIR AND SUBACROMIAL DECOMPRESSION, BICEPS TENOTOMY;  Surgeon: Sammye Cristal, MD;  Location: [facility];  Service: Orthopedics;  Laterality: Right;   Family History  Problem Relation Age of Onset   ALS Father    Asthma Daughter    Cancer Sister  breast/liver   Cancer Brother        bone cancer   Social History   Socioeconomic History   Marital status: Married    Spouse name: Not on file   Number of children: Not on file   Years of education: Not on file   Highest education level: Not on file  Occupational History   Not on file  Tobacco Use   Smoking status: Former    Current packs/day: 0.00    Average packs/day: 1.5 packs/day for 8.0 years (12.0 ttl pk-yrs)    Types: Cigarettes    Start date: 03/05/1950    Quit date: 03/05/1958    Years since quitting: 65.4   Smokeless tobacco: Never  Vaping Use   Vaping status: Never Used  Substance and Sexual Activity   Alcohol  use: No    Alcohol /week: 0.0 standard drinks of alcohol    Drug use: No   Sexual activity: Not Currently  Other Topics Concern   Not on file  Social History Narrative   Married (wife patient of Dr. Arlene Ben). 3 children. 4 grandchildren. 3 greatgrandchildren.       Retired from [employer]      Hobbies: rabbit hunting, time with dogs   Social Drivers of Corporate investment banker Strain: Low Risk  (07/16/2023)   Overall Financial Resource Strain (CARDIA)    Difficulty of  Paying Living Expenses: Not hard at all  Food Insecurity: No Food Insecurity (07/16/2023)   Hunger Vital Sign    Worried About Running Out of Food in the Last Year: Never true    Ran Out of Food in the Last Year: Never true  Transportation Needs: No Transportation Needs (07/16/2023)   PRAPARE - Administrator, Civil Service (Medical): No    Lack of Transportation (Non-Medical): No  Physical Activity: Sufficiently Active (07/16/2023)   Exercise Vital Sign    Days of Exercise per Week: 7 days    Minutes of Exercise per Session: 60 min  Stress: No Stress Concern Present (07/16/2023)   [employer] of Occupational Health - Occupational Stress Questionnaire    Feeling of Stress : Not at all  Social Connections: Moderately Integrated (07/16/2023)   Social Connection and Isolation Panel [NHANES]    Frequency of Communication with Friends and Family: More than three times a week    Frequency of Social Gatherings with Friends and Family: Three times a week    Attends Religious Services: More than 4 times per year    Active Member of Clubs or Organizations: No    Attends Banker Meetings: Never    Marital Status: Married    Tobacco Counseling Counseling given: Not Answered    Clinical Intake:  Pre-visit preparation completed: Yes  Pain : 0-10 Pain Score: 6  Pain Type: Chronic pain Pain Location: Back Pain Orientation: Lower Pain Descriptors / Indicators: Aching Pain Onset: More than a month ago Pain Frequency: Intermittent     BMI - recorded: 25.18 Nutritional Status: BMI 25 -29 Overweight Diabetes: Yes CBG done?: No Did pt. bring in CBG monitor from home?: No  Lab Results  Component Value Date   HGBA1C 7.6 (H) 03/18/2023   HGBA1C 7.5 (H) 11/16/2022   HGBA1C 7.0 (A) 06/25/2022     How often do you need to have someone help you when you read instructions, pamphlets, or other written materials from your doctor or pharmacy?: 1 -  Never  Interpreter Needed?: No  Information entered  by :: Lamont Pilsner, LPN   Activities of Daily Living     07/16/2023   10:20 AM  In your present state of health, do you have any difficulty performing the following activities:  Hearing? 1  Comment slight HOH  Vision? 0  Difficulty concentrating or making decisions? 0  Walking or climbing stairs? 0  Dressing or bathing? 0  Doing errands, shopping? 0  Preparing Food and eating ? N  Using the Toilet? N  In the past six months, have you accidently leaked urine? N  Do you have problems with loss of bowel control? N  Managing your Medications? N  Managing your Finances? N  Housekeeping or managing your Housekeeping? N    Patient Care Team: Almira Jaeger, MD as PCP - General (Family Medicine) Jacqueline Matsu, MD as PCP - Cardiology (Cardiology) Jacqueline Matsu, MD as Consulting Physician (Cardiology) Marine Sia, MD as Consulting Physician (Pulmonary Disease) [facility], Zola Hint, MD as Attending Physician Gaynelle Keeling, MD as Consulting Physician (Dermatology) Albert Huff, MD as Consulting Physician (Ophthalmology) Isidro Margo, DO as Consulting Physician (Sports Medicine) Rolando Cliche, [facility] as Pharmacist (Pharmacist)  Indicate any recent Medical Services you may have received from other than [facility] providers in the past year (date may be approximate).     Assessment:    This is a routine wellness examination for Salathiel.  Hearing/Vision screen Hearing Screening - Comments:: Slight HOH  Vision Screening - Comments:: Pt will follow with new provider    Goals Addressed             This Visit's Progress    Patient Stated       Maintain health and activity        Depression Screen     07/16/2023   10:24 AM 11/16/2022    8:19 AM 07/12/2022   11:01 AM 06/27/2021   10:48 AM 05/09/2020   10:41 AM 12/28/2019    2:51 PM 04/28/2019   10:42 AM  PHQ 2/9 Scores  PHQ - 2 Score 0 0 0 0 0 0 0  PHQ-  9 Score  0     0    Fall Risk     07/16/2023   10:25 AM 11/16/2022    8:16 AM 07/12/2022   11:02 AM 06/27/2021   10:49 AM 05/09/2020   10:41 AM  Fall Risk   Falls in the past year? 0 0 0 0 0  Number falls in past yr: 0 0 0 0 0  Injury with Fall? 0 0 0 0 0  Risk for fall due to : No Fall Risks No Fall Risks Impaired vision Impaired vision   Follow up Falls prevention discussed Falls evaluation completed Falls prevention discussed Falls prevention discussed     MEDICARE RISK AT HOME:  Medicare Risk at Home Any stairs in or around the home?: Yes If so, are there any without handrails?: No Home free of loose throw rugs in walkways, pet beds, electrical cords, etc?: Yes Adequate lighting in your home to reduce risk of falls?: Yes Life alert?: No Use of a cane, walker or w/c?: No Grab bars in the bathroom?: No Shower chair or bench in shower?: Yes Elevated toilet seat or a handicapped toilet?: No  TIMED UP AND GO:  Was the test performed?  No  Cognitive Function: Declined/Normal: No cognitive concerns noted by patient or family. Patient alert, oriented, able to answer questions appropriately and recall recent events. No signs of memory  loss or confusion.    07/16/2023   10:25 AM 02/07/2017   10:25 AM 02/03/2016    9:44 AM  MMSE - Mini Mental State Exam  Not completed: Unable to complete -- --        07/12/2022   11:03 AM 06/27/2021   10:51 AM 12/28/2019    2:56 PM 11/27/2018   12:38 PM  6CIT Screen  What Year? 0 points 0 points 0 points 0 points  What month? 0 points 0 points 0 points 0 points  What time? 0 points 0 points  0 points  Count back from 20 0 points 0 points 0 points 0 points  Months in reverse 0 points 2 points 0 points 0 points  Repeat phrase 4 points 2 points 6 points 0 points  Total Score 4 points 4 points  0 points    Immunizations Immunization History  Administered Date(s) Administered   Fluad Trivalent(High Dose 65+) 11/06/2022   Influenza Split  12/04/2010, 01/23/2012   Influenza Whole 12/26/2006, 11/28/2007, 11/24/2008, 12/30/2009   Influenza, High Dose Seasonal PF 01/13/2013, 12/10/2013, 11/21/2017, 11/19/2018, 11/02/2020, 11/10/2021   Influenza-Unspecified 10/31/2015, 11/21/2016, 12/25/2019   Moderna Covid-19 Fall Seasonal Vaccine 72yrs & older 11/06/2022   Moderna SARS-COV2 Booster Vaccination 11/10/2021   Moderna Sars-Covid-2 Vaccination 03/15/2019, 04/26/2019, 02/02/2020, 08/15/2020   PNEUMOCOCCAL CONJUGATE-20 11/02/2020   Pneumococcal Conjugate-13 07/27/2014   Pneumococcal Polysaccharide-23 06/27/2005   Td 06/27/2005, 04/09/2017   Tdap 09/13/2022   Zoster Recombinant(Shingrix) 09/30/2018, 12/22/2018   Zoster, Live 10/31/2015    Screening Tests Health Maintenance  Topic Date Due   OPHTHALMOLOGY EXAM  05/23/2022   COVID-19 Vaccine (6 - Moderna risk 2024-25 season) 05/06/2023   HEMOGLOBIN A1C  09/15/2023   INFLUENZA VACCINE  10/04/2023   FOOT EXAM  11/16/2023   Medicare Annual Wellness (AWV)  07/15/2024   DTaP/Tdap/Td (4 - Td or Tdap) 09/12/2032   Pneumonia Vaccine 62+ Years old  Completed   Zoster Vaccines- Shingrix  Completed   HPV VACCINES  Aged Out   Meningococcal B Vaccine  Aged Out    Health Maintenance  Health Maintenance Due  Topic Date Due   OPHTHALMOLOGY EXAM  05/23/2022   COVID-19 Vaccine (6 - Moderna risk 2024-25 season) 05/06/2023   Health Maintenance Items Addressed: See Nurse Notes  Additional Screening:  Vision Screening: Recommended annual ophthalmology exams for early detection of glaucoma and other disorders of the eye.  Dental Screening: Recommended annual dental exams for proper oral hygiene  Community Resource Referral / Chronic Care Management: CRR required this visit?  No   CCM required this visit?  No   Plan:    I have personally reviewed and noted the following in the patient's chart:   Medical and social history Use of alcohol , tobacco or illicit drugs  Current  medications and supplements including opioid prescriptions. Patient is currently taking opioid prescriptions. Information provided to patient regarding non-opioid alternatives. Patient advised to discuss non-opioid treatment plan with their provider. Functional ability and status Nutritional status Physical activity Advanced directives List of other physicians Hospitalizations, surgeries, and ER visits in previous 12 months Vitals Screenings to include cognitive, depression, and falls Referrals and appointments  In addition, I have reviewed and discussed with patient certain preventive protocols, quality metrics, and best practice recommendations. A written personalized care plan for preventive services as well as general preventive health recommendations were provided to patient.   Bruno Capri, LPN   05/10/6576   After Visit Summary: (Declined) Due to this being  a telephonic visit, with patients personalized plan was offered to patient but patient Declined AVS at this time   Notes: Pt AWV completed by Wife.

## 2023-07-16 NOTE — Patient Instructions (Signed)
 [name] , Thank you for taking time out of your busy schedule to complete your Annual Wellness Visit with me. I enjoyed our conversation and look forward to speaking with you again next year. I, as well as your care team,  appreciate your ongoing commitment to your health goals. Please review the following plan we discussed and let me know if I can assist you in the future. Your Game plan/ To Do List    Referrals: If you haven't heard from the office you've been referred to, please reach out to them at the phone provided.   Follow up Visits: Next Medicare AWV with our clinical staff: 07/23/24 @ 10:40 a.m   Have you seen your provider in the last 6 months (3 months if uncontrolled diabetes)? Yes Next Office Visit with your provider: 07/17/23 @ 11:00   Clinician Recommendations:  Aim for 30 minutes of exercise or brisk walking, 6-8 glasses of water, and 5 servings of fruits and vegetables each day.       This is a list of the screening recommended for you and due dates:  Health Maintenance  Topic Date Due   Eye exam for diabetics  05/23/2022   COVID-19 Vaccine (6 - Moderna risk 2024-25 season) 05/06/2023   Hemoglobin A1C  09/15/2023   Flu Shot  10/04/2023   Complete foot exam   11/16/2023   Medicare Annual Wellness Visit  07/15/2024   DTaP/Tdap/Td vaccine (4 - Td or Tdap) 09/12/2032   Pneumonia Vaccine  Completed   Zoster (Shingles) Vaccine  Completed   HPV Vaccine  Aged Out   Meningitis B Vaccine  Aged Out    Advanced directives: (Declined) Advance directive discussed with you today. Even though you declined this today, please call our office should you change your mind, and we can give you the proper paperwork for you to fill out. Advance Care Planning is important because it:  [x]  Makes sure you receive the medical care that is consistent with your values, goals, and preferences  [x]  It provides guidance to your family and loved ones and reduces their decisional burden about  whether or not they are making the right decisions based on your wishes.  Follow the link provided in your after visit summary or read over the paperwork we have mailed to you to help you started getting your Advance Directives in place. If you need assistance in completing these, please reach out to us  so that we can help you!  See attachments for Preventive Care and Fall Prevention Tips.

## 2023-07-17 ENCOUNTER — Ambulatory Visit: Payer: Self-pay | Admitting: Family Medicine

## 2023-07-17 ENCOUNTER — Encounter: Payer: Self-pay | Admitting: Family Medicine

## 2023-07-17 ENCOUNTER — Other Ambulatory Visit: Payer: Self-pay | Admitting: Family Medicine

## 2023-07-17 ENCOUNTER — Other Ambulatory Visit: Payer: Self-pay

## 2023-07-17 ENCOUNTER — Ambulatory Visit: Payer: PPO | Admitting: Family Medicine

## 2023-07-17 VITALS — BP 110/60 | HR 69 | Temp 97.9°F | Ht 69.5 in | Wt 174.2 lb

## 2023-07-17 DIAGNOSIS — I25119 Atherosclerotic heart disease of native coronary artery with unspecified angina pectoris: Secondary | ICD-10-CM

## 2023-07-17 DIAGNOSIS — I10 Essential (primary) hypertension: Secondary | ICD-10-CM | POA: Diagnosis not present

## 2023-07-17 DIAGNOSIS — E1121 Type 2 diabetes mellitus with diabetic nephropathy: Secondary | ICD-10-CM | POA: Diagnosis not present

## 2023-07-17 DIAGNOSIS — E785 Hyperlipidemia, unspecified: Secondary | ICD-10-CM

## 2023-07-17 DIAGNOSIS — E1169 Type 2 diabetes mellitus with other specified complication: Secondary | ICD-10-CM

## 2023-07-17 DIAGNOSIS — Z7984 Long term (current) use of oral hypoglycemic drugs: Secondary | ICD-10-CM

## 2023-07-17 DIAGNOSIS — N184 Chronic kidney disease, stage 4 (severe): Secondary | ICD-10-CM

## 2023-07-17 LAB — COMPREHENSIVE METABOLIC PANEL WITH GFR
ALT: 14 U/L (ref 0–53)
AST: 14 U/L (ref 0–37)
Albumin: 4.4 g/dL (ref 3.5–5.2)
Alkaline Phosphatase: 84 U/L (ref 39–117)
BUN: 36 mg/dL — ABNORMAL HIGH (ref 6–23)
CO2: 24 meq/L (ref 19–32)
Calcium: 9.3 mg/dL (ref 8.4–10.5)
Chloride: 104 meq/L (ref 96–112)
Creatinine, Ser: 1.88 mg/dL — ABNORMAL HIGH (ref 0.40–1.50)
GFR: 31.47 mL/min — ABNORMAL LOW (ref >60.00)
Glucose, Bld: 145 mg/dL — ABNORMAL HIGH (ref 70–99)
Potassium: 4.1 meq/L (ref 3.5–5.1)
Sodium: 138 meq/L (ref 135–145)
Total Bilirubin: 0.7 mg/dL (ref 0.2–1.2)
Total Protein: 6.6 g/dL (ref 6.0–8.3)

## 2023-07-17 LAB — CBC WITH DIFFERENTIAL/PLATELET
Basophils Absolute: 0.1 10*3/uL (ref 0.0–0.1)
Basophils Relative: 0.9 % (ref 0.0–3.0)
Eosinophils Absolute: 1 10*3/uL — ABNORMAL HIGH (ref 0.0–0.7)
Eosinophils Relative: 11.9 % — ABNORMAL HIGH (ref 0.0–5.0)
HCT: 39.2 % (ref 39.0–52.0)
Hemoglobin: 13 g/dL (ref 13.0–17.0)
Lymphocytes Relative: 17.6 % (ref 12.0–46.0)
Lymphs Abs: 1.5 10*3/uL (ref 0.7–4.0)
MCHC: 33.2 g/dL (ref 30.0–36.0)
MCV: 87 fl (ref 78.0–100.0)
Monocytes Absolute: 0.6 10*3/uL (ref 0.1–1.0)
Monocytes Relative: 6.7 % (ref 3.0–12.0)
Neutro Abs: 5.4 10*3/uL (ref 1.4–7.7)
Neutrophils Relative %: 62.9 % (ref 43.0–77.0)
Platelets: 143 10*3/uL — ABNORMAL LOW (ref 150.0–400.0)
RBC: 4.5 Mil/uL (ref 4.22–5.81)
RDW: 15.1 % (ref 11.5–15.5)
WBC: 8.7 10*3/uL (ref 4.0–10.5)

## 2023-07-17 LAB — MICROALBUMIN / CREATININE URINE RATIO
Creatinine,U: 80.9 mg/dL
Microalb Creat Ratio: 172.7 mg/g — ABNORMAL HIGH (ref 0.0–30.0)
Microalb, Ur: 14 mg/dL — ABNORMAL HIGH (ref 0.0–1.9)

## 2023-07-17 LAB — HEMOGLOBIN A1C: Hgb A1c MFr Bld: 7.3 % — ABNORMAL HIGH (ref 4.6–6.5)

## 2023-07-17 MED ORDER — LISINOPRIL 5 MG PO TABS
5.0000 mg | ORAL_TABLET | Freq: Every day | ORAL | 3 refills | Status: AC
Start: 2023-07-17 — End: ?

## 2023-07-17 MED ORDER — BLOOD GLUCOSE TEST VI STRP: ORAL_STRIP | 11 refills | Status: AC

## 2023-07-17 MED ORDER — TRAMADOL HCL 50 MG PO TABS: ORAL_TABLET | ORAL | 2 refills | Status: DC

## 2023-07-17 NOTE — Patient Instructions (Addendum)
 Please stop by lab before you go If you have mychart- we will send your results within 3 business days of us  receiving them.  If you do not have mychart- we will call you about results within 5 business days of us  receiving them.  *please also note that you will see labs on mychart as soon as they post. I will later go in and write notes on them- will say "notes from Dr. Arlene Ben"   Considering referral to kidney doctor depending on how labs look- doesn't mean you are going to need interventions but just would want their opinion- some gradual worsening over time  Recommended follow up: Return in about 4 months (around 11/17/2023) for followup or sooner if needed.Schedule b4 you leave.

## 2023-07-17 NOTE — Progress Notes (Signed)
 Phone [phone] In person visit   Subjective:   Roy [name] is a 89 y.o. year old very pleasant male patient who presents for/with See problem oriented charting Chief Complaint  Patient presents with   Follow-up    Patient is not fasting.    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) 09/11/2006    Priority: High   Mild persistent asthma 10/30/2010    Priority: Medium    Stage 3b chronic kidney disease (HCC) 02/22/2009    Priority: Medium    Hyperlipidemia associated with type 2 diabetes mellitus (HCC) 09/11/2006    Priority: Medium    Essential hypertension 09/11/2006    Priority: Medium    Back pain 09/11/2006    Priority: Medium    Sleep apnea 09/11/2006    Priority: Medium    Food impaction of esophagus 04/03/2018    Priority: Low   GERD (gastroesophageal reflux disease) 05/03/2014    Priority: Low   Insomnia 05/03/2014    Priority: Low   Allergic rhinitis 04/21/2014    Priority: Low   Giardia 04/21/2014    Priority: Low   NEPHROLITHIASIS, RECURRENT 01/21/2009    Priority: Low   ACTINIC KERATOSIS, HEAD 06/14/2008    Priority: Low   Right rotator cuff tear 12/11/2018    Priority: 1.   Calcific tendinitis of right shoulder 09/03/2018    Priority: 1.   AC (acromioclavicular) arthritis 09/03/2018    Priority: 1.   Pain of right thumb 04/08/2018    Priority: 1.    Medications- reviewed and updated Current Outpatient Medications  Medication Sig Dispense Refill   acetaminophen  (TYLENOL ) 325 MG tablet Take 650 mg by mouth every evening. Reported on 06/28/2015     albuterol  (VENTOLIN  HFA) 108 (90 Base) MCG/ACT inhaler Inhale 2 puffs into the lungs every 6 (six) hours as needed for wheezing or shortness of breath. 1 each 2   amLODipine  (NORVASC ) 2.5 MG tablet Take 1 tablet by mouth once  daily 90 tablet 3   aspirin  EC 81 MG tablet Take 1 tablet (81 mg total) by mouth daily.     ASSURE COMFORT LANCETS 30G MISC 1 each by Other route daily.     atorvastatin  (LIPITOR) 20 MG tablet Take 1 tablet by mouth once daily 90 tablet 0   Blood Glucose Monitoring Suppl DEVI Use to test blood sugars daily. Dx: E11.9 1 each 3   brimonidine (ALPHAGAN) 0.2 % ophthalmic solution Apply 1 drop to eye.     budesonide -formoterol  (SYMBICORT ) 80-4.5 MCG/ACT inhaler Inhale 2 puffs into the lungs 2 (two) times daily. 1 each 11   calcium -vitamin D (OSCAL WITH D) 500-200 MG-UNIT per tablet Take 1 tablet by mouth daily.     empagliflozin  (JARDIANCE ) 25 MG TABS tablet Take 1 tablet (25 mg total) by mouth daily before breakfast. 30 tablet 11   glipiZIDE  2.5 MG TABS Take 1 tablet by mouth daily before breakfast. 90 tablet 3   guaiFENesin  (MUCINEX ) 600 MG 12 hr tablet Take by mouth 2 (two) times daily.     isosorbide  mononitrate (IMDUR ) 30 MG 24 hr tablet Take 3 tablets by mouth once daily 270 tablet 2   Lancets Misc. MISC Use to test blood sugars daily. Dx: E11.9 100 each 0   latanoprost (XALATAN) 0.005 % ophthalmic solution INSTILL 1 DROP  INTO EACH EYE NIGHTLY     lisinopril  (ZESTRIL ) 5 MG tablet Take 1/2 (one-half) tablet by mouth once daily 45 tablet 2   metoprolol  succinate (TOPROL -XL) 50 MG 24 hr tablet TAKE 1 TABLET BY MOUTH ONCE DAILY (TAKE  WITH  OR  IMMEDIATELY  FOLLOWING  A  MEAL) 90 tablet 0   nitroGLYCERIN  (NITROSTAT ) 0.4 MG SL tablet DISSOLVE ONE TABLET UNDER THE TONGUE EVERY FIVE MINUTES AS NEEDED FOR CHEST PAIN 25 tablet 1   Omega-3 Fatty Acids (FISH OIL PO) Take 2 capsules by mouth 2 (two) times daily.     omeprazole  (PRILOSEC) 20 MG capsule Take 1 capsule by mouth twice daily 180 capsule 0   Polyethyl Glycol-Propyl Glycol (SYSTANE OP) Place 1 drop into both eyes 4 (four) times daily. Itchy/dry eye     potassium citrate  (UROCIT-K ) 10 MEQ (1080 MG) SR tablet Take 10 mEq by mouth Twice daily.      ranolazine  (RANEXA ) 500 MG 12 hr tablet TAKE 1 TABLET BY MOUTH TWICE DAILY 180 tablet 3   traMADol  (ULTRAM ) 50 MG tablet Take 1 tablet twice a day as needed 60 tablet 2   traZODone  (DESYREL ) 150 MG tablet Take 1 tablet by mouth once daily 90 tablet 0   zinc  sulfate 220 MG capsule Take 220 mg by mouth daily.     Glucose Blood (BLOOD GLUCOSE TEST STRIPS) STRP Use to test blood sugars daily. Dx: E11.9. if cheaper option available we can change meters 100 strip 11   No current facility-administered medications for this visit.     Objective:  BP 110/60   Pulse 69   Temp 97.9 F (36.6 C) (Temporal)   Ht 5' 9.5" (1.765 m)   Wt 174 lb 3.2 oz (79 kg)   SpO2 97%   BMI 25.36 kg/m  Gen: NAD, resting comfortably CV: RRR no murmurs rubs or gallops Lungs: CTAB no crackles, wheeze, rhonchi Ext: trace edema Skin: warm, dry     Assessment and Plan   #Back pain- flared up in February and went to see orthopedics- waited 2 weeks for insurance approval for injection and by time approved Dr. Donna Fus had left practice and they didn't have anyone to do injections- he essentially ended up working through it with muscle relaxants but down to normal again   # Diabetes S:compliant with glipizide  2.5 mg daily, restart jardiance  10 mg with microalbuminuria 11/2022 and later went up to 25 mg - sugars 110s to 200s Lab Results  Component Value Date   HGBA1C 7.6 (H) 03/18/2023   HGBA1C 7.5 (H) 11/16/2022   HGBA1C 7.0 (A) 06/25/2022  A/P: hopefully stable or improved-  update a1c today. Continue current meds for now - likely to tolerate as long as under 8   I discussed the microalbumin to creatinine lab error with patient that occurred with [facility].  Essentially the ratio was off by a factor of 10.  In this patient's individual case his #s were actually 385 and have trended down to 79- repeat today with higher jardiance  and he is on low dose lisinopril  as well    # CAD with angina/Hyperlipidemia- sees Dr.  Micael Adas S:compliant with atorvastatin  20mg  and aspirin  81mg . LDL goal <70 . Asymptomatic while on ranexa  and imdur - no chest pain or shortness of breath  Lab Results  Component Value Date   CHOL 113 11/16/2022   HDL 42.80 11/16/2022   LDLCALC 47 11/16/2022   LDLDIRECT 55.0 12/30/2018   TRIG 117.0 11/16/2022  CHOLHDL 3 11/16/2022  A/P: coronary artery disease asymptomatic continue current medications  . Lipids at goal- continue current medications   # Hypertension/CKD III--> IV S:compliant with  Amlodipine  2.5, imdur  90mg , lisinopril  2.5 mg, metoprolol  50mg  XR A/P: hypertension stable- continue current medicines   CKD IV- update labs- considering referring to nephrology  as GFR more consistently in high 20's in last year and last visit down to 22.   # Asthma S: compliant with Symbicort  80-4.5 mg 2 puffs twice daily. Doesn't use albuterol  A/P:stable- continue current medicines   #history of kidney stones- remains on potassium citrate  through urology    #chronic pain for hand pain (also sees [facility])- tramadol  takes a mild edge off of pain- willing to refill - refill today- also needed some of rback issues  #Anemia and platelet issues in the past-hematology 01/13/2019 said no further work-up and released patient to as needed-overall largely stable= update today   Recommended follow up: Return in about 4 months (around 11/17/2023) for followup or sooner if needed.Schedule b4 you leave. Future Appointments  Date Time Provider Department Center  07/23/2024 10:40 AM [facility] ANNUAL WELLNESS VISIT 1 [facility]    Lab/Order associations:   ICD-10-CM   1. Coronary artery disease involving native coronary artery of native heart with angina pectoris (HCC)  I25.119     2. Type 2 diabetes mellitus with diabetic nephropathy, without long-term current use of insulin  (HCC)  E11.21 Comprehensive metabolic panel with GFR    CBC with Differential/Platelet    Hemoglobin A1c    Microalbumin  / creatinine urine ratio    3. Essential hypertension  I10     4. Hyperlipidemia associated with type 2 diabetes mellitus (HCC)  E11.69    E78.5     5. CKD (chronic kidney disease), stage IV (HCC)  N18.4       Meds ordered this encounter  Medications   Glucose Blood (BLOOD GLUCOSE TEST STRIPS) STRP    Sig: Use to test blood sugars daily. Dx: E11.9. if cheaper option available we can change meters    Dispense:  100 strip    Refill:  11    Return precautions advised.  Clarisa Crooked, MD

## 2023-07-19 ENCOUNTER — Other Ambulatory Visit: Payer: Self-pay | Admitting: Physician Assistant

## 2023-08-07 ENCOUNTER — Other Ambulatory Visit: Payer: Self-pay | Admitting: Family Medicine

## 2023-09-25 ENCOUNTER — Other Ambulatory Visit: Payer: Self-pay | Admitting: Family Medicine

## 2023-11-02 ENCOUNTER — Other Ambulatory Visit: Payer: Self-pay | Admitting: Family Medicine

## 2023-11-09 ENCOUNTER — Other Ambulatory Visit: Payer: Self-pay | Admitting: Family Medicine

## 2023-11-19 ENCOUNTER — Encounter: Payer: Self-pay | Admitting: Family Medicine

## 2023-11-19 ENCOUNTER — Ambulatory Visit: Admitting: Family Medicine

## 2023-11-19 ENCOUNTER — Ambulatory Visit: Payer: Self-pay | Admitting: Family Medicine

## 2023-11-19 VITALS — BP 128/62 | HR 62 | Temp 97.3°F | Ht 69.5 in | Wt 170.6 lb

## 2023-11-19 DIAGNOSIS — E1121 Type 2 diabetes mellitus with diabetic nephropathy: Secondary | ICD-10-CM | POA: Diagnosis not present

## 2023-11-19 DIAGNOSIS — Z7984 Long term (current) use of oral hypoglycemic drugs: Secondary | ICD-10-CM

## 2023-11-19 DIAGNOSIS — I25119 Atherosclerotic heart disease of native coronary artery with unspecified angina pectoris: Secondary | ICD-10-CM | POA: Diagnosis not present

## 2023-11-19 DIAGNOSIS — E785 Hyperlipidemia, unspecified: Secondary | ICD-10-CM | POA: Diagnosis not present

## 2023-11-19 DIAGNOSIS — N1832 Chronic kidney disease, stage 3b: Secondary | ICD-10-CM

## 2023-11-19 DIAGNOSIS — E1169 Type 2 diabetes mellitus with other specified complication: Secondary | ICD-10-CM

## 2023-11-19 DIAGNOSIS — I10 Essential (primary) hypertension: Secondary | ICD-10-CM

## 2023-11-19 DIAGNOSIS — D696 Thrombocytopenia, unspecified: Secondary | ICD-10-CM

## 2023-11-19 LAB — LIPID PANEL
Cholesterol: 116 mg/dL (ref 0–200)
HDL: 41 mg/dL (ref >39.00)
LDL Cholesterol: 48 mg/dL (ref 0–99)
NonHDL: 74.86
Total CHOL/HDL Ratio: 3
Triglycerides: 133 mg/dL (ref 0.0–149.0)
VLDL: 26.6 mg/dL (ref 0.0–40.0)

## 2023-11-19 LAB — COMPREHENSIVE METABOLIC PANEL WITH GFR
ALT: 9 U/L (ref 0–53)
AST: 12 U/L (ref 0–37)
Albumin: 4.4 g/dL (ref 3.5–5.2)
Alkaline Phosphatase: 69 U/L (ref 39–117)
BUN: 42 mg/dL — ABNORMAL HIGH (ref 6–23)
CO2: 26 meq/L (ref 19–32)
Calcium: 9.3 mg/dL (ref 8.4–10.5)
Chloride: 106 meq/L (ref 96–112)
Creatinine, Ser: 2.11 mg/dL — ABNORMAL HIGH (ref 0.40–1.50)
GFR: 27.33 mL/min — ABNORMAL LOW (ref >60.00)
Glucose, Bld: 126 mg/dL — ABNORMAL HIGH (ref 70–99)
Potassium: 4.1 meq/L (ref 3.5–5.1)
Sodium: 135 meq/L (ref 135–145)
Total Bilirubin: 0.7 mg/dL (ref 0.2–1.2)
Total Protein: 6.8 g/dL (ref 6.0–8.3)

## 2023-11-19 LAB — CBC WITH DIFFERENTIAL/PLATELET
Basophils Absolute: 0.1 K/uL (ref 0.0–0.1)
Basophils Relative: 1 % (ref 0.0–3.0)
Eosinophils Absolute: 1.1 K/uL — ABNORMAL HIGH (ref 0.0–0.7)
Eosinophils Relative: 13.6 % — ABNORMAL HIGH (ref 0.0–5.0)
HCT: 38.7 % — ABNORMAL LOW (ref 39.0–52.0)
Hemoglobin: 12.8 g/dL — ABNORMAL LOW (ref 13.0–17.0)
Lymphocytes Relative: 18 % (ref 12.0–46.0)
Lymphs Abs: 1.4 K/uL (ref 0.7–4.0)
MCHC: 32.9 g/dL (ref 30.0–36.0)
MCV: 87.3 fl (ref 78.0–100.0)
Monocytes Absolute: 0.6 K/uL (ref 0.1–1.0)
Monocytes Relative: 7.7 % (ref 3.0–12.0)
Neutro Abs: 4.8 K/uL (ref 1.4–7.7)
Neutrophils Relative %: 59.7 % (ref 43.0–77.0)
Platelets: 131 K/uL — ABNORMAL LOW (ref 150.0–400.0)
RBC: 4.43 Mil/uL (ref 4.22–5.81)
RDW: 14.7 % (ref 11.5–15.5)
WBC: 8.1 K/uL (ref 4.0–10.5)

## 2023-11-19 LAB — MICROALBUMIN / CREATININE URINE RATIO
Creatinine,U: 79.6 mg/dL
Microalb Creat Ratio: 195.3 mg/g — ABNORMAL HIGH (ref 0.0–30.0)
Microalb, Ur: 15.5 mg/dL — ABNORMAL HIGH (ref 0.0–1.9)

## 2023-11-19 LAB — HEMOGLOBIN A1C: Hgb A1c MFr Bld: 7.2 % — ABNORMAL HIGH (ref 4.6–6.5)

## 2023-11-19 NOTE — Addendum Note (Signed)
 Addended by: KATRINKA GARNETTE KIDD on: 11/19/2023 07:40 PM   Modules accepted: Level of Service

## 2023-11-19 NOTE — Progress Notes (Signed)
 Phone [phone] In person visit   Subjective:   Roy [name] is a 89 y.o. 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) 09/11/2006    Priority: High   Mild persistent asthma 10/30/2010    Priority: Medium    Stage 3b chronic kidney disease (HCC) 02/22/2009    Priority: Medium    Hyperlipidemia associated with type 2 diabetes mellitus (HCC) 09/11/2006    Priority: Medium    Essential hypertension 09/11/2006    Priority: Medium    Back pain 09/11/2006    Priority: Medium    Sleep apnea 09/11/2006    Priority: Medium    Food impaction of esophagus 04/03/2018    Priority: Low   GERD (gastroesophageal reflux disease) 05/03/2014    Priority: Low   Insomnia 05/03/2014    Priority: Low   Allergic rhinitis 04/21/2014    Priority: Low   Giardia 04/21/2014    Priority: Low   NEPHROLITHIASIS, RECURRENT 01/21/2009    Priority: Low   ACTINIC KERATOSIS, HEAD 06/14/2008    Priority: Low   Right rotator cuff tear 12/11/2018    Priority: 1.   Calcific tendinitis of right shoulder 09/03/2018    Priority: 1.   AC (acromioclavicular) arthritis 09/03/2018    Priority: 1.   Pain of right thumb 04/08/2018    Priority: 1.    Medications- reviewed and updated Current Outpatient Medications  Medication Sig Dispense Refill   acetaminophen  (TYLENOL ) 325 MG tablet Take 650 mg by mouth every evening. Reported on 06/28/2015     albuterol  (VENTOLIN  HFA) 108 (90 Base) MCG/ACT inhaler Inhale 2 puffs into the lungs every 6 (six) hours as needed for wheezing or shortness of breath. 1 each 2   amLODipine  (NORVASC ) 2.5 MG tablet Take 1 tablet by mouth once  daily 90 tablet 3   aspirin  EC 81 MG tablet Take 1 tablet (81 mg total) by mouth daily.     ASSURE COMFORT LANCETS 30G MISC 1 each by Other route daily.     atorvastatin  (LIPITOR) 20 MG tablet Take 1 tablet by mouth once daily 90 tablet 0   Blood Glucose Monitoring Suppl DEVI Use to test blood sugars daily. Dx: E11.9 1 each 3   brimonidine (ALPHAGAN) 0.2 % ophthalmic solution Apply 1 drop to eye.     budesonide -formoterol  (SYMBICORT ) 80-4.5 MCG/ACT inhaler Inhale 2 puffs into the lungs 2 (two) times daily. 1 each 11   calcium -vitamin D (OSCAL WITH D) 500-200 MG-UNIT per tablet Take 1 tablet by mouth daily.     empagliflozin  (JARDIANCE ) 25 MG TABS tablet Take 1 tablet (25 mg total) by mouth daily before breakfast. 30 tablet 11   glipiZIDE  2.5 MG TABS TAKE 1 TABLET BY MOUTH ONCE DAILY BEFORE BREAKFAST 90 tablet 0   Glucose Blood (BLOOD GLUCOSE TEST STRIPS) STRP Use to test blood sugars daily. Dx: E11.9. if cheaper option available we can change meters 100 strip 11   isosorbide  mononitrate (IMDUR ) 30 MG 24 hr tablet Take 3 tablets by mouth once daily 270 tablet 2   Lancets Misc. MISC Use to test blood sugars daily. Dx: E11.9 100 each 0  latanoprost (XALATAN) 0.005 % ophthalmic solution INSTILL 1 DROP INTO EACH EYE NIGHTLY     lisinopril  (ZESTRIL ) 5 MG tablet Take 1 tablet (5 mg total) by mouth daily. 90 tablet 3   metoprolol  succinate (TOPROL -XL) 50 MG 24 hr tablet TAKE 1 TABLET BY MOUTH ONCE DAILY WITH OR IMMEDIATELY FOLLOWING A MEAL 90 tablet 1   nitroGLYCERIN  (NITROSTAT ) 0.4 MG SL tablet DISSOLVE ONE TABLET UNDER THE TONGUE EVERY FIVE MINUTES AS NEEDED FOR CHEST PAIN 25 tablet 1   Omega-3 Fatty Acids (FISH OIL PO) Take 2 capsules by mouth 2 (two) times daily.     omeprazole  (PRILOSEC) 20 MG capsule Take 1 capsule by mouth twice daily 180 capsule 0   Polyethyl Glycol-Propyl Glycol (SYSTANE OP) Place 1 drop into both eyes 4 (four) times daily. Itchy/dry eye     potassium citrate  (UROCIT-K ) 10 MEQ  (1080 MG) SR tablet Take 10 mEq by mouth Twice daily.     ranolazine  (RANEXA ) 500 MG 12 hr tablet TAKE 1 TABLET BY MOUTH TWICE DAILY 180 tablet 3   traMADol  (ULTRAM ) 50 MG tablet Take 1 tablet twice a day as needed 60 tablet 2   traZODone  (DESYREL ) 150 MG tablet Take 1 tablet by mouth once daily 90 tablet 0   zinc  sulfate 220 MG capsule Take 220 mg by mouth daily.     No current facility-administered medications for this visit.     Objective:  BP 128/62 (BP Location: Left Arm, Patient Position: Sitting, Cuff Size: Normal)   Pulse 62   Temp (!) 97.3 F (36.3 C) (Temporal)   Ht 5' 9.5 (1.765 m)   Wt 170 lb 9.6 oz (77.4 kg)   SpO2 94%   BMI 24.83 kg/m  Gen: NAD, resting comfortably CV: RRR no murmurs rubs or gallops Lungs: CTAB no crackles, wheeze, rhonchi Ext: minimal edema Skin: warm, dry  Diabetic foot exam was performed with the following findings:   No deformities, ulcerations, or other skin breakdown Normal sensation of 10g monofilament Intact posterior tibialis and dorsalis pedis pulses Onychomycosis noted         Assessment and Plan   # Hypertension/CKD III/ IV S:compliant with imdur  90mg , lisinopril  2.5 mg--> 5 mg last visit (he thinks he's taking, metoprolol  50mg  XR, amlodipine  2.5 mg (thinks still taking- we had considered/mentioned stopping) BP Readings from Last 3 Encounters:  11/19/23 128/62  07/17/23 110/60  03/18/23 120/62  A/P: well controlled continue current medications for blood pressure    With better hydration kidney function improved last visit back to stage III range- continue to monitor and update today  # Diabetes S:compliant with glipizide  2.5 mg daily, restart jardiance  10 mg with microalbuminuria 11/2022 --> 25 mg but cautious with CKD III/IV border. Also restarted lisinopril  - prior  Metformin  500mg  half tablet with breakfast-stop 02/19/2022 due to chronic kidney disease  - no low blood sugars Lab Results  Component Value Date   HGBA1C  7.3 (H) 07/17/2023   HGBA1C 7.6 (H) 03/18/2023   HGBA1C 7.5 (H) 11/16/2022  A/P: improved a1c last visit- we will tolerate under 7.5. thankful no lows with glipizide   For microalbuminuria- hoping stable to improved back on lisinopril  and increasing dose and on jardiance  25 mg- check today   # CAD with angina/Hyperlipidemia- sees Dr. Shlomo S:compliant with atorvastatin  20mg  and aspirin  81mg . LDL goal <70 . Asymptomatic while on ranexa  and imdur .  -no chest pain or shortness of breath  Lab Results  Component Value Date   CHOL  113 11/16/2022   HDL 42.80 11/16/2022   LDLCALC 47 11/16/2022   LDLDIRECT 55.0 12/30/2018   TRIG 117.0 11/16/2022   CHOLHDL 3 11/16/2022  A/P: coronary artery disease asymptomatic continue current medications Lipids at goal last year but due for repeat- check today   # Asthma S: compliant with Symbicort  80-4.5 mg 2 puffs twice daily. uses albuterol   A/P:doing well- continue to monitor   #history of kidney stones- remains on potassium citrate  through urology  - just had recent visit  #chronic pain for hand pain (also sees [facility])- tramadol  takes a mild edge off of pain- willing to refill - not needing as much  #insomnia- trazodone  helping for sleep - still helpful but not perfect  #thrombocytopenia- mildly low- recheck levels Lab Results  Component Value Date   WBC 8.7 07/17/2023   HGB 13.0 07/17/2023   HCT 39.2 07/17/2023   MCV 87.0 07/17/2023   PLT 143.0 (L) 07/17/2023   Recommended follow up: Return in about 4 months (around 03/20/2024) for physical or sooner if needed.Schedule b4 you leave. Future Appointments  Date Time Provider Department Center  07/23/2024 10:40 AM [facility] ANNUAL WELLNESS VISIT 1 [facility]    Lab/Order associations: 1 egg this morning   ICD-10-CM   1. Type 2 diabetes mellitus with diabetic nephropathy, without long-term current use of insulin  (HCC)  E11.21 Comprehensive metabolic panel with GFR    CBC with  Differential/Platelet    Lipid panel    Hemoglobin A1c    Microalbumin / creatinine urine ratio    2. Coronary artery disease involving native coronary artery of native heart with angina pectoris (HCC)  I25.119     3. Essential hypertension  I10 Comprehensive metabolic panel with GFR    CBC with Differential/Platelet    4. Hyperlipidemia associated with type 2 diabetes mellitus (HCC)  E11.69    E78.5     5. Stage 3b chronic kidney disease (HCC)  N18.32     6. Thrombocytopenia (HCC)  D69.6       No orders of the defined types were placed in this encounter.   Return precautions advised.  Garnette Lukes, MD

## 2023-11-19 NOTE — Patient Instructions (Addendum)
 Please stop by lab before you go If you have mychart- we will send your results within 3 business days of us  receiving them.  If you do not have mychart- we will call you about results within 5 business days of us  receiving them.  *please also note that you will see labs on mychart as soon as they post. I will later go in and write notes on them- will say notes from Dr. Katrinka   No changes today unless labs lead us  to make changes  May need to increase lisinopril  depending on urine test and kidney function  Recommended follow up: Return in about 4 months (around 03/20/2024) for physical or sooner if needed.Schedule b4 you leave.

## 2023-11-20 ENCOUNTER — Other Ambulatory Visit: Payer: Self-pay | Admitting: Family Medicine

## 2023-11-20 MED ORDER — LISINOPRIL 10 MG PO TABS: 10.0000 mg | ORAL_TABLET | Freq: Every day | ORAL | 3 refills | Status: AC

## 2023-12-02 ENCOUNTER — Other Ambulatory Visit: Payer: Self-pay | Admitting: Family Medicine

## 2023-12-21 ENCOUNTER — Other Ambulatory Visit: Payer: Self-pay | Admitting: Family Medicine

## 2023-12-31 ENCOUNTER — Other Ambulatory Visit: Payer: Self-pay | Admitting: Family Medicine

## 2024-01-01 ENCOUNTER — Other Ambulatory Visit: Payer: Self-pay | Admitting: Family Medicine

## 2024-01-02 ENCOUNTER — Other Ambulatory Visit: Payer: Self-pay | Admitting: Family Medicine

## 2024-01-31 ENCOUNTER — Other Ambulatory Visit: Payer: Self-pay | Admitting: Physician Assistant

## 2024-02-02 ENCOUNTER — Other Ambulatory Visit: Payer: Self-pay | Admitting: Family Medicine

## 2024-02-06 ENCOUNTER — Other Ambulatory Visit: Payer: Self-pay | Admitting: Family Medicine

## 2024-02-15 ENCOUNTER — Other Ambulatory Visit: Payer: Self-pay | Admitting: Physician Assistant

## 2024-02-25 ENCOUNTER — Ambulatory Visit: Attending: Physician Assistant | Admitting: Physician Assistant

## 2024-02-25 ENCOUNTER — Encounter: Payer: Self-pay | Admitting: Physician Assistant

## 2024-02-25 VITALS — BP 124/52 | HR 61 | Ht 69.5 in | Wt 171.0 lb

## 2024-02-25 DIAGNOSIS — I10 Essential (primary) hypertension: Secondary | ICD-10-CM

## 2024-02-25 DIAGNOSIS — I2510 Atherosclerotic heart disease of native coronary artery without angina pectoris: Secondary | ICD-10-CM

## 2024-02-25 DIAGNOSIS — E785 Hyperlipidemia, unspecified: Secondary | ICD-10-CM

## 2024-02-25 DIAGNOSIS — N1832 Chronic kidney disease, stage 3b: Secondary | ICD-10-CM

## 2024-02-25 MED ORDER — NITROGLYCERIN 0.4 MG SL SUBL: SUBLINGUAL_TABLET | SUBLINGUAL | 1 refills | Status: AC

## 2024-02-25 MED ORDER — AMLODIPINE BESYLATE 2.5 MG PO TABS: 2.5000 mg | ORAL_TABLET | Freq: Every day | ORAL | 3 refills | Status: AC

## 2024-02-25 MED ORDER — ATORVASTATIN CALCIUM 20 MG PO TABS: 20.0000 mg | ORAL_TABLET | Freq: Every day | ORAL | 0 refills | Status: AC

## 2024-02-25 MED ORDER — POTASSIUM CITRATE ER 10 MEQ (1080 MG) PO TBCR: 10.0000 meq | EXTENDED_RELEASE_TABLET | Freq: Two times a day (BID) | ORAL | 3 refills | Status: AC

## 2024-02-25 MED ORDER — METOPROLOL SUCCINATE ER 50 MG PO TB24: ORAL_TABLET | ORAL | 0 refills | Status: AC

## 2024-02-25 MED ORDER — ASPIRIN EC 81 MG PO TBEC: 81.0000 mg | DELAYED_RELEASE_TABLET | Freq: Every day | ORAL | 3 refills | Status: AC

## 2024-02-25 MED ORDER — EMPAGLIFLOZIN 25 MG PO TABS: 25.0000 mg | ORAL_TABLET | Freq: Every day | ORAL | 11 refills | Status: AC

## 2024-02-25 MED ORDER — OMEPRAZOLE 20 MG PO CPDR: 20.0000 mg | DELAYED_RELEASE_CAPSULE | Freq: Two times a day (BID) | ORAL | 0 refills | Status: AC

## 2024-02-25 MED ORDER — ISOSORBIDE MONONITRATE ER 30 MG PO TB24: 90.0000 mg | ORAL_TABLET | Freq: Every day | ORAL | 2 refills | Status: AC

## 2024-02-25 MED ORDER — RANOLAZINE ER 500 MG PO TB12: ORAL_TABLET | ORAL | 3 refills | Status: AC

## 2024-02-25 NOTE — Progress Notes (Signed)
 " Cardiology Office Note:  .   Date:  02/25/2024  ID:  Roy [name], DOB [birth year], MRN [id number] PCP: Katrinka Garnette KIDD, MD  [facility] Providers Cardiologist:  Wilbert Bihari, MD {  History of Present Illness: .   Roy [name] is a 89 y.o. male with a history of CAD status post PTCA D1 in 1999 with repeat cath 12/2011 with nonobstructive disease (chest pain felt to be secondary to small vessel disease), CKD, diabetes, HTN, OSA intolerant to CPAP and hyperlipidemia.  He was seen 01/11/2022 and was doing well at that time.  Denied chest pain/pressure, SOB, DOE, PND, orthopnea, lower extremity edema, dizziness, palpitations, or syncope.  Compliant with medications with no side effects.  I saw him 11/24, he presents with a  history of coronary artery disease and diabetes, presents for a routine follow-up. The patient reports feeling well and continues to be active, managing a two-acre garden daily. The patient had a coronary artery stent placement in 1999 and has had no chest pain or other cardiac symptoms since then. The patient's diabetes is reportedly well-controlled with Jardiance , which was recently started. The patient's medication regimen also includes amlodipine , aspirin , Lipitor, Imdur , lisinopril , metoprolol  succinate, nitroglycerin , and Ranexa . The patient has not needed to use nitroglycerin .  Reports no shortness of breath nor dyspnea on exertion. Reports no chest pain, pressure, or tightness. No edema, orthopnea, PND. Reports no palpitations.   Today, he presents for cardiovascular follow-up. He is accompanied by his wife.  He reports no recent cardiac problems. He is taking lisinopril  with uncertainty if the dose is 5 mg or 10 mg, though he recalls using 2.5 mg last year and believes the current dose is 5 mg. He also takes metoprolol  and Jardiance , which his primary care doctor recently increased. He has no chest pain, shortness of breath, or lower extremity edema. His last  EKG was about a year ago.  He follows with a nephrologist and reports improved kidney function since starting Jardiance , with CKD improving from stage 4 to stage 3. Recent labs showed LDL 48, triglycerides 133, HDL 41, and A1c 7.2. No other changes were made to his diabetes regimen aside from the Jardiance  increase.  He has COPD, uses inhalers in the morning and evening, and denies shortness of breath.  He remains active with gardening, woodworking, and caring for animals, and has strong family support at home.  Reports no shortness of breath nor dyspnea on exertion. Reports no chest pain, pressure, or tightness. No edema, orthopnea, PND. Reports no palpitations.   Discussed the use of AI scribe software for clinical note transcription with the patient, who gave verbal consent to proceed.   ROS: Pertinent ROS in HPI   Studies Reviewed: .        None.   Physical Exam:   VS:  BP (!) 124/52   Pulse 61   Ht 5' 9.5 (1.765 m)   Wt 171 lb (77.6 kg)   SpO2 97%   BMI 24.89 kg/m    Wt Readings from Last 3 Encounters:  02/25/24 171 lb (77.6 kg)  11/19/23 170 lb 9.6 oz (77.4 kg)  07/17/23 174 lb 3.2 oz (79 kg)    GEN: Well nourished, well developed in no acute distress NECK: No JVD; No carotid bruits CARDIAC: RRR, no murmurs, rubs, gallops RESPIRATORY:  Clear to auscultation without rales, wheezing or rhonchi  ABDOMEN: Soft, non-tender, non-distended EXTREMITIES:  No edema; No deformity   ASSESSMENT AND PLAN: .  Coronary artery disease without angina No symptoms of chest pain or dyspnea. Blood pressure controlled. - He will need an EKG at his next PCP visit.  Essential hypertension Blood pressure controlled at 124/52 mmHg. Asymptomatic low diastolic pressure. - Monitor blood pressure regularly. - Report dizziness or lightheadedness.  Hyperlipidemia LDL and triglycerides controlled. Low HDL likely hereditary. - Encouraged increased physical activity to improve  HDL.  Chronic kidney disease stage 3b Improved from stage 4 to 3b, likely due to Jardiance . - Continue Jardiance  as prescribed      Dispo: He can follow-up in a year.  Signed, Orren LOISE Fabry, PA-C   "

## 2024-02-25 NOTE — Patient Instructions (Signed)
 Medication Instructions:  Your physician recommends that you continue on your current medications as directed. Please refer to the Current Medication list given to you today.  *If you need a refill on your cardiac medications before your next appointment, please call your pharmacy*  Lab Work: None ordered If you have labs (blood work) drawn today and your tests are completely normal, you will receive your results only by: MyChart Message (if you have MyChart) OR A paper copy in the mail If you have any lab test that is abnormal or we need to change your treatment, we will call you to review the results.  Testing/Procedures: None ordered  Follow-Up: At [facility], you and your health needs are our priority.  As part of our continuing mission to provide you with exceptional heart care, our providers are all part of one team.  This team includes your primary Cardiologist (physician) and Advanced Practice Providers or APPs (Physician Assistants and Nurse Practitioners) who all work together to provide you with the care you need, when you need it.  Your next appointment:   1 year(s)  Provider:   Wilbert Bihari, MD    We recommend signing up for the patient portal called MyChart.  Sign up information is provided on this After Visit Summary.  MyChart is used to connect with patients for Virtual Visits (Telemedicine).  Patients are able to view lab/test results, encounter notes, upcoming appointments, etc.  Non-urgent messages can be sent to your provider as well.   To learn more about what you can do with MyChart, go to [email or web address].   Other Instructions

## 2024-03-01 ENCOUNTER — Other Ambulatory Visit: Payer: Self-pay | Admitting: Family Medicine

## 2024-03-03 ENCOUNTER — Other Ambulatory Visit: Payer: Self-pay | Admitting: Family Medicine

## 2024-04-07 ENCOUNTER — Encounter: Admitting: Family Medicine

## 2024-07-23 ENCOUNTER — Ambulatory Visit

## 2024-08-10 ENCOUNTER — Encounter: Admitting: Family Medicine
